# Patient Record
Sex: Female | Born: 1950
Health system: Southern US, Community
[De-identification: ages and names within clinical notes are randomized; demographics above are authoritative.]

## PROBLEM LIST (undated history)

## (undated) DIAGNOSIS — F32A Depression, unspecified: Secondary | ICD-10-CM

## (undated) DIAGNOSIS — J440 Chronic obstructive pulmonary disease with acute lower respiratory infection: Secondary | ICD-10-CM

## (undated) DIAGNOSIS — E119 Type 2 diabetes mellitus without complications: Secondary | ICD-10-CM

## (undated) DIAGNOSIS — R011 Cardiac murmur, unspecified: Secondary | ICD-10-CM

## (undated) DIAGNOSIS — Z9289 Personal history of other medical treatment: Secondary | ICD-10-CM

## (undated) DIAGNOSIS — E1169 Type 2 diabetes mellitus with other specified complication: Secondary | ICD-10-CM

## (undated) DIAGNOSIS — D494 Neoplasm of unspecified behavior of bladder: Secondary | ICD-10-CM

## (undated) DIAGNOSIS — C679 Malignant neoplasm of bladder, unspecified: Secondary | ICD-10-CM

## (undated) DIAGNOSIS — I10 Essential (primary) hypertension: Secondary | ICD-10-CM

## (undated) DIAGNOSIS — E7800 Pure hypercholesterolemia, unspecified: Secondary | ICD-10-CM

## (undated) DIAGNOSIS — Z8719 Personal history of other diseases of the digestive system: Secondary | ICD-10-CM

## (undated) DIAGNOSIS — F329 Major depressive disorder, single episode, unspecified: Secondary | ICD-10-CM

## (undated) DIAGNOSIS — K219 Gastro-esophageal reflux disease without esophagitis: Secondary | ICD-10-CM

## (undated) DIAGNOSIS — E669 Obesity, unspecified: Secondary | ICD-10-CM

## (undated) DIAGNOSIS — J209 Acute bronchitis, unspecified: Secondary | ICD-10-CM

## (undated) DIAGNOSIS — J189 Pneumonia, unspecified organism: Secondary | ICD-10-CM

## (undated) DIAGNOSIS — J449 Chronic obstructive pulmonary disease, unspecified: Secondary | ICD-10-CM

## (undated) DIAGNOSIS — D509 Iron deficiency anemia, unspecified: Secondary | ICD-10-CM

## (undated) HISTORY — DX: Acute bronchitis, unspecified: J20.9

## (undated) HISTORY — DX: Chronic obstructive pulmonary disease with (acute) lower respiratory infection: J44.0

## (undated) HISTORY — DX: Obesity, unspecified: E66.9

## (undated) HISTORY — DX: Type 2 diabetes mellitus with other specified complication: E11.69

## (undated) HISTORY — DX: Cardiac murmur, unspecified: R01.1

---

## 1978-11-15 HISTORY — PX: TUBAL LIGATION: SHX77

## 1978-11-15 HISTORY — PX: APPENDECTOMY: SHX54

## 2010-07-28 ENCOUNTER — Emergency Department (HOSPITAL_COMMUNITY): Payer: 59

## 2010-07-28 ENCOUNTER — Inpatient Hospital Stay (HOSPITAL_COMMUNITY)
Admission: EM | Admit: 2010-07-28 | Discharge: 2010-07-31 | DRG: 193 | Disposition: A | Discharge status: Home / Self Care | Payer: 59 | Attending: Family Medicine | Admitting: Family Medicine

## 2010-07-28 DIAGNOSIS — M79609 Pain in unspecified limb: Secondary | ICD-10-CM

## 2010-07-28 DIAGNOSIS — J9620 Acute and chronic respiratory failure, unspecified whether with hypoxia or hypercapnia: Secondary | ICD-10-CM | POA: Diagnosis present

## 2010-07-28 DIAGNOSIS — E876 Hypokalemia: Secondary | ICD-10-CM | POA: Diagnosis present

## 2010-07-28 DIAGNOSIS — J189 Pneumonia, unspecified organism: Principal | ICD-10-CM | POA: Diagnosis present

## 2010-07-28 DIAGNOSIS — K219 Gastro-esophageal reflux disease without esophagitis: Secondary | ICD-10-CM | POA: Diagnosis present

## 2010-07-28 DIAGNOSIS — R0902 Hypoxemia: Secondary | ICD-10-CM | POA: Diagnosis present

## 2010-07-28 DIAGNOSIS — E871 Hypo-osmolality and hyponatremia: Secondary | ICD-10-CM | POA: Diagnosis present

## 2010-07-28 DIAGNOSIS — F3289 Other specified depressive episodes: Secondary | ICD-10-CM | POA: Diagnosis present

## 2010-07-28 DIAGNOSIS — F329 Major depressive disorder, single episode, unspecified: Secondary | ICD-10-CM | POA: Diagnosis present

## 2010-07-28 DIAGNOSIS — F17200 Nicotine dependence, unspecified, uncomplicated: Secondary | ICD-10-CM | POA: Diagnosis present

## 2010-07-28 DIAGNOSIS — J441 Chronic obstructive pulmonary disease with (acute) exacerbation: Secondary | ICD-10-CM | POA: Diagnosis present

## 2010-07-28 DIAGNOSIS — M7989 Other specified soft tissue disorders: Secondary | ICD-10-CM | POA: Diagnosis present

## 2010-07-28 LAB — DIFFERENTIAL
Basophils Absolute: 0 10*3/uL (ref 0.0–0.1)
Basophils Relative: 0 % (ref 0–1)
Eosinophils Absolute: 0 10*3/uL (ref 0.0–0.7)
Eosinophils Relative: 0 % (ref 0–5)
Lymphocytes Relative: 11 % — ABNORMAL LOW (ref 12–46)
Lymphs Abs: 1.2 10*3/uL (ref 0.7–4.0)
Monocytes Absolute: 1.1 10*3/uL — ABNORMAL HIGH (ref 0.1–1.0)
Monocytes Relative: 10 % (ref 3–12)
Neutro Abs: 8.6 K/uL — ABNORMAL HIGH (ref 1.7–7.7)
Neutrophils Relative %: 79 % — ABNORMAL HIGH (ref 43–77)

## 2010-07-28 LAB — BASIC METABOLIC PANEL WITH GFR
CO2: 27 meq/L (ref 19–32)
Calcium: 8.7 mg/dL (ref 8.4–10.5)
Chloride: 91 meq/L — ABNORMAL LOW (ref 96–112)
Creatinine, Ser: <0.47 mg/dL (ref 0.4–1.2)
Glucose, Bld: 133 mg/dL — ABNORMAL HIGH (ref 70–99)
Sodium: 130 meq/L — ABNORMAL LOW (ref 135–145)

## 2010-07-28 LAB — BASIC METABOLIC PANEL
BUN: 11 mg/dL (ref 6–23)
Potassium: 3.2 mEq/L — ABNORMAL LOW (ref 3.5–5.1)

## 2010-07-28 LAB — CBC
HCT: 35.4 % — ABNORMAL LOW (ref 36.0–46.0)
Hemoglobin: 11.7 g/dL — ABNORMAL LOW (ref 12.0–15.0)
MCH: 27.9 pg (ref 26.0–34.0)
MCHC: 33.1 g/dL (ref 30.0–36.0)
MCV: 84.3 fL (ref 78.0–100.0)
Platelets: 260 10*3/uL (ref 150–400)
RBC: 4.2 MIL/uL (ref 3.87–5.11)
RDW: 12.4 % (ref 11.5–15.5)
WBC: 10.9 10*3/uL — ABNORMAL HIGH (ref 4.0–10.5)

## 2010-07-28 LAB — CK TOTAL AND CKMB (NOT AT ARMC)
Relative Index: 2.4 (ref 0.0–2.5)
Total CK: 435 U/L — ABNORMAL HIGH (ref 7–177)

## 2010-07-28 LAB — D-DIMER, QUANTITATIVE: D-Dimer, Quant: 0.32 ug/mL-FEU (ref 0.00–0.48)

## 2010-07-29 LAB — CARDIAC PANEL(CRET KIN+CKTOT+MB+TROPI)
Relative Index: 2.5 (ref 0.0–2.5)
Total CK: 493 U/L — ABNORMAL HIGH (ref 7–177)
Troponin I: <0.3 ng/mL (ref <0.30)

## 2010-07-29 LAB — CK TOTAL AND CKMB (NOT AT ARMC): Relative Index: 2.6 — ABNORMAL HIGH (ref 0.0–2.5)

## 2010-07-29 LAB — HEMOGLOBIN A1C
Hgb A1c MFr Bld: 6.5 % — ABNORMAL HIGH (ref <5.7)
Mean Plasma Glucose: 140 mg/dL — ABNORMAL HIGH (ref <117)

## 2010-07-29 LAB — COMPREHENSIVE METABOLIC PANEL
AST: 33 U/L (ref 0–37)
Albumin: 3.4 g/dL — ABNORMAL LOW (ref 3.5–5.2)
Calcium: 8.7 mg/dL (ref 8.4–10.5)
Creatinine, Ser: <0.47 mg/dL (ref 0.4–1.2)
Total Protein: 7 g/dL (ref 6.0–8.3)

## 2010-07-29 LAB — CBC
Hemoglobin: 11.3 g/dL — ABNORMAL LOW (ref 12.0–15.0)
MCH: 27.8 pg (ref 26.0–34.0)
RBC: 4.07 MIL/uL (ref 3.87–5.11)

## 2010-07-29 LAB — TROPONIN I: Troponin I: <0.3 ng/mL (ref <0.30)

## 2010-07-30 LAB — CBC
HCT: 33.2 % — ABNORMAL LOW (ref 36.0–46.0)
Hemoglobin: 10.5 g/dL — ABNORMAL LOW (ref 12.0–15.0)
MCH: 27.6 pg (ref 26.0–34.0)
MCHC: 31.6 g/dL (ref 30.0–36.0)
MCV: 87.1 fL (ref 78.0–100.0)
Platelets: 270 10*3/uL (ref 150–400)
RBC: 3.81 MIL/uL — ABNORMAL LOW (ref 3.87–5.11)
RDW: 12.7 % (ref 11.5–15.5)
WBC: 9.2 10*3/uL (ref 4.0–10.5)

## 2010-07-31 LAB — DIFFERENTIAL
Basophils Absolute: 0.1 K/uL (ref 0.0–0.1)
Basophils Relative: 1 % (ref 0–1)
Eosinophils Absolute: 0 K/uL (ref 0.0–0.7)
Eosinophils Relative: 0 % (ref 0–5)
Lymphocytes Relative: 34 % (ref 12–46)
Lymphs Abs: 3.2 K/uL (ref 0.7–4.0)
Monocytes Absolute: 0.8 K/uL (ref 0.1–1.0)
Monocytes Relative: 9 % (ref 3–12)
Neutro Abs: 5.3 K/uL (ref 1.7–7.7)
Neutrophils Relative %: 56 % (ref 43–77)

## 2010-07-31 LAB — COMPREHENSIVE METABOLIC PANEL
ALT: 30 U/L (ref 0–35)
AST: 33 U/L (ref 0–37)
Albumin: 3.1 g/dL — ABNORMAL LOW (ref 3.5–5.2)
Alkaline Phosphatase: 50 U/L (ref 39–117)
Glucose, Bld: 87 mg/dL (ref 70–99)
Potassium: 3.9 mEq/L (ref 3.5–5.1)
Sodium: 139 mEq/L (ref 135–145)
Total Protein: 6.3 g/dL (ref 6.0–8.3)

## 2010-07-31 LAB — CBC
HCT: 32.5 % — ABNORMAL LOW (ref 36.0–46.0)
Hemoglobin: 10.1 g/dL — ABNORMAL LOW (ref 12.0–15.0)
MCH: 27.4 pg (ref 26.0–34.0)
MCHC: 31.1 g/dL (ref 30.0–36.0)
MCV: 88.1 fL (ref 78.0–100.0)
Platelets: 246 K/uL (ref 150–400)
RBC: 3.69 MIL/uL — ABNORMAL LOW (ref 3.87–5.11)
RDW: 12.6 % (ref 11.5–15.5)
WBC: 9.4 K/uL (ref 4.0–10.5)

## 2010-07-31 LAB — MAGNESIUM: Magnesium: 2.2 mg/dL (ref 1.5–2.5)

## 2010-08-01 NOTE — Discharge Summary (Signed)
Savannah Stanley, Savannah Stanley                ACCOUNT NO.:  [id number]  MEDICAL RECORD NO.:  [id number]           PATIENT TYPE:  I  LOCATION:  1521                         FACILITY:  [facility]  PHYSICIAN:  Talmage Nap, MD  DATE OF BIRTH:  1950/07/25  DATE OF ADMISSION:  07/28/2010 DATE OF DISCHARGE:  07/31/2010                        DISCHARGE SUMMARY - REFERRING   DISCHARGING DOCTOR:  Talmage Nap, M.D.  PRIMARY CARE PHYSICIAN:  Unassigned.  Patient is to make an appointment with [facility].  ADMITTING PHYSICIAN:  Hind I Elsaid, M.D.  DISCHARGE DIAGNOSES: 1. Chronic obstructive pulmonary disease exacerbation. 2. Questionable pneumonia. 3. Chronic hypoxemia (Acute on chronic respiratory failure).  Patient     is on home O2 3 liters per minute q.24h.  Pulse ox in room air     without oxygen 87%. 4. Chronic tobacco use - Patient to be on nicotine patch. 5. Gastroesophageal reflux disease. 6. History of depression.  BRIEF HISTORY:  Patient is a 60 year old very pleasant Caucasian female, who was admitted to the hospital on Jul 28, 2010 by Dr. Ebony Cargo because of 4 days history of chest congestion and cough as well as wheezing.  Patient was said to have recently relocated to [facility] following the death of her husband 6 months ago.  Symptoms were however said to be getting progressively worse.  The chest congestion was said to be bilateral and pleuritic.  This was said to be associated with cough that was productive of sputum.  She, however, denies any fever. She denies any chills or rigor.  In the Emergency Room, patient was found to have a temperature of 97.4.  There was, however, no history of nausea or vomiting.  She denies any PND, orthopnea.  Cough was said to be associated with persistent tachycardic and also hypoxia. Patient presented to the hospital to be evaluated.  PREADMISSION MEDICATIONS:  Her preadmission meds include: 1. Celexa (citalopram 20 mg one  p.o. daily). 2. Prilosec (omeprazole 20 mg p.o. daily).  ALLERGIES:  She has no known allergies.  SOCIAL HISTORY:  Positive for chronic tobacco use.  PAST SURGICAL HISTORY: 1. Appendectomy. 2. Tubal ligation.  FAMILY HISTORY:  Father died of complication of prostate CA and husband recently deceased.  Cause of death not documented in the initial history and physical.  REVIEW OF SYSTEMS:  Essentially documented in the initial history and physical.  PHYSICAL EXAMINATION:  VITAL SIGNS:  At time patient was seen by the admitting physician, vital signs, temperature of 97.7, blood pressure is 167/79, pulse 109, respiratory rate 24, saturating 90% O2 via nasal cannula 2 liters per minute. GENERAL:  She was said to be in mild respiratory distress.  She was not using her accessory muscles to breathe. HEENT:  Pupils are reactive to light and extraocular muscles are intact. NECK:  No jugular venous distention.  No carotid bruit.  No lymphadenopathy. CHEST:  Showed inspiratory and inspiratory rhonchi. HEART:  Sounds are one and two. ABDOMEN:  Soft, nontender.  Liver, spleen and kidney not palpable. Bowel sounds are positive. EXTREMITIES:  Show mild edema in the left lower extremity, nonpitting. Peripheral pulses were  intact. NEUROPSYCHIATRIC EVALUATION:  Unremarkable. SKIN:  Normal turgor. MUSCULOSKELETAL SYSTEM:  Unremarkable.  LABORATORY DATA:  Initial basic metabolic panel showed sodium of 130, potassium of 3.2, chloride of 19 with the bicarb of 27, glucose is 133, BUN is 11, creatinine 0.47.  Complete blood count with differential showed WBC of 10.9, hemoglobin of 9.7, hematocrit of 35.4, MCV of 84.3 with the platelet count of 260,000, neutrophils 79% and absolute neutrophil count is 8.6.  D-dimer 0.32.  Cardiac enzymes, troponin-I less than 0.30, CK-MB is 10.3.  Repeat of troponin-I as follows:  Less than 0.30 and CK-MB 13.1, INR 1.2 respectively.  A repeat complete blood count  with no differential done on Jul 29, 2010 showed WBC 7.5, hemoglobin 11.3, hematocrit of 34.8, MCV of 85.5 with the platelet count of 262,000.  Comprehensive metabolic panel showed sodium of 138, potassium of 3.8, chloride of 99 with the bicarb of 28, glucose is 130, BUN is 12, creatinine 0.47.  Magnesium level is 2.4, phosphorus level is 4.3.  Hemoglobin A1c is 6.5.  A repeat complete blood count with differential done on Jul 31, 2010 showed WBC of 9.4, hemoglobin of 10.1, hematocrit 32.5, MCV of 88.1 with the platelet count of 246,000, normal differentials.  Magnesium level is 2.2.  Comprehensive metabolic panel showed sodium of 139, potassium of 3.9, chloride of 99 with the bicarb of 33, glucose is 87, BUN is 17, creatinine 0.47.  LFTs normal. Pathology smear showed normocytic anemia and atypical mononuclear cells.  DIAGNOSTIC DATA:  Imaging studies done include chest x-ray which showed bronchial thickening, patchy infiltrate in both lower lungs consistent with mild pneumonia, no dense consolidation seen.  HOSPITAL COURSE:  Patient was admitted to telemetry with an impression of pneumonia with bronchitis, was started on normal saline with 20 mEq of KCl to go at rate of 75 mL an hour.  She was started on Zithromax 500 mg IV q.24h., Rocephin 1 gram IV q.24h. and she was also started on Solu- Medrol 60 mg IV q.12h.  Heparin 5000 units subcutaneously daily for DVT prophylaxis.  She was also given Zofran for nausea.  In addition, patient was started on breathing treatment, albuterol and Atrovent nebs q.6h.  She was also given Mucinex as well as Robitussin.  Pain control was done with Tylenol p.r.n. for temperature greater than 100.6. Patient was also given Ambien 5 mg p.o. q.h.s. p.r.n. for insomnia and GI prophylaxis was done with Protonix 40 mg p.o. daily.  On Jul 29, 2010, IV Solu-Medrol was discontinued and patient was started on prednisone 60 mg p.o. daily and she also had IV fluids  KVO.  Also, given to patient was Combivent MDI 2 puffs t.i.d. p.r.n.  Patient was seen by me for the very first time in this admission.  On Jul 30, 2010 and during this encounter, patient denied any complaint and pulse ox in room air was 84% without oxygen.  Examination of the patient showed minimal rhonchi.  At this point, patient was continued on breathing treatment and also IV Rocephin and Zithromax for questionable community-acquired pneumonia.  She was also re-evaluated by me on Jul 31, 2010 and during this encounter, patient felt better.  She denied any chest pain or shortness of breath, cough was much reduced.  She was ambulated without O2 and  pulse ox in room air was 87%, so far, patient has remained clinically stable.  I had an extensive discussion on my two encounters with the patient, patient's son and daughter as well  as her son-in-law and all verbalized understanding.  Plan therefore is for patient to be discharged home today on activity as tolerated, smoking cessation and she was advised,  to make an appointment with [facility] in order to get a permanent primary care physician.  DISCHARGE MEDICATIONS:  Medications to be taken at home will include: 1. Advair Diskus 250/50 one puff b.i.d. 2. Albuterol nebulizer q.4h. p.r.n. 3. Atrovent nebs 0.5 mg q.4h. p.r.n. 4. Augmentin 500 mg one p.o. b.i.d. for the next 7 days. 5. Guaifenesin 600 mg one p.o. b.i.d. 6. Home O2 3 liters per minute q.24h.  Pulse ox in room air is 87%. 7. Combivent inhaler 2 puffs q.4h. p.r.n. 8. Nebulizer machine. 9. Nicotine patch 14 mg transdermal q.24h. 10.Prednisone starting with 20 mg p.o. daily x1 and then 10 mg p.o.     daily x1 and subsequently discontinued. 11.Celexa (citalopram) 20 mg one p.o. daily. 12.Prilosec (omeprazole) 20 mg one p.o. daily. 13.Ambien 5mg  p.o qhs prn.     Talmage Nap, MD     CN/MEDQ  D:  07/31/2010  T:  07/31/2010  Job:  [id number]  cc:   Cor[facility]  Primary Care  Electronically Signed by Talmage Nap  on 08/01/2010 07:01:17 AM

## 2010-08-07 ENCOUNTER — Encounter: Payer: Self-pay | Admitting: Internal Medicine

## 2010-08-07 ENCOUNTER — Ambulatory Visit (INDEPENDENT_AMBULATORY_CARE_PROVIDER_SITE_OTHER): Payer: 59 | Admitting: Internal Medicine

## 2010-08-07 VITALS — BP 166/102 | Ht 63.0 in | Wt 172.0 lb

## 2010-08-07 DIAGNOSIS — D649 Anemia, unspecified: Secondary | ICD-10-CM

## 2010-08-07 DIAGNOSIS — F3289 Other specified depressive episodes: Secondary | ICD-10-CM

## 2010-08-07 DIAGNOSIS — F32A Depression, unspecified: Secondary | ICD-10-CM

## 2010-08-07 DIAGNOSIS — J4489 Other specified chronic obstructive pulmonary disease: Secondary | ICD-10-CM

## 2010-08-07 DIAGNOSIS — R739 Hyperglycemia, unspecified: Secondary | ICD-10-CM

## 2010-08-07 DIAGNOSIS — F329 Major depressive disorder, single episode, unspecified: Secondary | ICD-10-CM

## 2010-08-07 DIAGNOSIS — J449 Chronic obstructive pulmonary disease, unspecified: Secondary | ICD-10-CM

## 2010-08-07 DIAGNOSIS — G4700 Insomnia, unspecified: Secondary | ICD-10-CM

## 2010-08-07 DIAGNOSIS — IMO0001 Reserved for inherently not codable concepts without codable children: Secondary | ICD-10-CM

## 2010-08-07 DIAGNOSIS — R7309 Other abnormal glucose: Secondary | ICD-10-CM

## 2010-08-07 DIAGNOSIS — R030 Elevated blood-pressure reading, without diagnosis of hypertension: Secondary | ICD-10-CM

## 2010-08-07 LAB — CBC WITH DIFFERENTIAL/PLATELET
Basophils Relative: 1.8 % (ref 0.0–3.0)
Eosinophils Absolute: 0 10*3/uL (ref 0.0–0.7)
HCT: 33.3 % — ABNORMAL LOW (ref 36.0–46.0)
Lymphocytes Relative: 11.7 % — ABNORMAL LOW (ref 12.0–46.0)
MCHC: 33.2 g/dL (ref 30.0–36.0)
MCV: 86.8 fl (ref 78.0–100.0)
Monocytes Relative: 5.9 % (ref 3.0–12.0)
Neutro Abs: 8.8 10*3/uL — ABNORMAL HIGH (ref 1.4–7.7)
Neutrophils Relative %: 80.6 % — ABNORMAL HIGH (ref 43.0–77.0)

## 2010-08-07 MED ORDER — CITALOPRAM HYDROBROMIDE 20 MG PO TABS: 20.0000 mg | ORAL_TABLET | Freq: Every day | ORAL | Status: DC

## 2010-08-07 MED ORDER — IPRATROPIUM-ALBUTEROL 18-103 MCG/ACT IN AERO: 2.0000 | INHALATION_SPRAY | Freq: Four times a day (QID) | RESPIRATORY_TRACT | Status: DC | PRN

## 2010-08-07 MED ORDER — ZOLPIDEM TARTRATE 10 MG PO TABS: ORAL_TABLET | ORAL | Status: DC

## 2010-08-07 MED ORDER — OMEPRAZOLE 20 MG PO CPDR: 40.0000 mg | DELAYED_RELEASE_CAPSULE | Freq: Every day | ORAL | Status: DC

## 2010-08-07 MED ORDER — FLUTICASONE-SALMETEROL 250-50 MCG/DOSE IN AEPB: 1.0000 | INHALATION_SPRAY | Freq: Two times a day (BID) | RESPIRATORY_TRACT | Status: DC

## 2010-08-08 LAB — FOLATE: Folate: 18.7 ng/mL (ref >5.9)

## 2010-08-08 LAB — VITAMIN B12: Vitamin B-12: 858 pg/mL (ref 211–911)

## 2010-08-12 ENCOUNTER — Telehealth: Payer: Self-pay

## 2010-08-12 NOTE — Telephone Encounter (Signed)
Pt notified of lab results but declines the colonoscopy recommended by MD.   Pt wants to know if she can turn the O2 equipment back in  Pt also notes that pharm needs clarification on prilosec dosage. She has been taking one 20 mg tab qd but rx has her taking 20 mg bid. I will call pharm to clarify

## 2010-08-12 NOTE — Telephone Encounter (Signed)
Message copied by Beverely Low on Tue Aug 12, 2010  4:06 PM ------      Message from: Staci Righter      Created: Mon Aug 11, 2010  1:13 PM       Remains anemic but mildly improved. Does appear to be iron deficient. Recommend colonoscopy at least. If willing will need gi consult

## 2010-08-13 ENCOUNTER — Telehealth: Payer: Self-pay | Admitting: Internal Medicine

## 2010-08-13 NOTE — Telephone Encounter (Signed)
Pt is req to get her Oxygen tank pick up from [facility]. Pt needs to get order sent to [facility] fax # [phone].

## 2010-08-13 NOTE — Telephone Encounter (Signed)
Per Dr. Rodena Medin, an order cannot be written by him for [facility] to pick up the oxygen tank unless documentation is received stated that pt was weaned off the oxygen.  Spoke with pt who states that she wasn't weaned off it. She stopped using because she didn't need it anymore. Pt notes that [facility] visited her to assess her progress and gave her a smaller tank to use outside the house but she hasn't needed it.

## 2010-08-17 ENCOUNTER — Encounter: Payer: Self-pay | Admitting: Internal Medicine

## 2010-08-17 DIAGNOSIS — D649 Anemia, unspecified: Secondary | ICD-10-CM | POA: Insufficient documentation

## 2010-08-17 DIAGNOSIS — E1169 Type 2 diabetes mellitus with other specified complication: Secondary | ICD-10-CM | POA: Insufficient documentation

## 2010-08-17 DIAGNOSIS — F32A Depression, unspecified: Secondary | ICD-10-CM | POA: Insufficient documentation

## 2010-08-17 DIAGNOSIS — F329 Major depressive disorder, single episode, unspecified: Secondary | ICD-10-CM | POA: Insufficient documentation

## 2010-08-17 DIAGNOSIS — J449 Chronic obstructive pulmonary disease, unspecified: Secondary | ICD-10-CM | POA: Insufficient documentation

## 2010-08-17 DIAGNOSIS — G4700 Insomnia, unspecified: Secondary | ICD-10-CM | POA: Insufficient documentation

## 2010-08-17 NOTE — Assessment & Plan Note (Signed)
Asymptomatic. Obtain CBC, iron, B12

## 2010-08-17 NOTE — Assessment & Plan Note (Signed)
suboptimal control. Increase Ambien 10 mg q.h.s. P.r.n.

## 2010-08-17 NOTE — Assessment & Plan Note (Signed)
Asymptomatic. Restrict sugar and carbohydrate intake.

## 2010-08-17 NOTE — Assessment & Plan Note (Signed)
Isolated elevation. Normotensive control typically. Recommend out patient blood pressure log

## 2010-08-17 NOTE — Assessment & Plan Note (Signed)
Recent exacerbation with pneumonia. Returning to baseline. Off oxygen. Refilled Advair. Instructed to rinse mouth after use

## 2010-08-17 NOTE — Progress Notes (Signed)
  Subjective:    Patient ID: Savannah Stanley, female    DOB: 10-17-1950, 60 y.o.   MRN: [id number]  HPI patient presents to clinic to establish primary care and for followup of pneumonia. Recently hospitalized with possible bibasilar pneumonia with associated COPD. Was hypoxic and maintained on home O2 however states respiratory care has weaned her off of her oxygen and she is a symptomatically without shortness of breath. Has long standing tobacco history and is attempting cessation currently. Blood pressure elevated today which is unusual in home blood pressures have been normotensive. Has history of insomnia recently taking Ambien 5 mg q.h.s. With suboptimal results. Hospital labs reviewed including hemoglobin of approximately 10 and A1c of 6.5. Denies abdominal pain hematemesis hematochezia or melena. Has no formal history of diabetes. No other complaints  Reviewed past medical history, medications, allergies, past surgical history, family history, social history    Review of Systems  Constitutional: Negative for fever and chills.  HENT: Negative for congestion, facial swelling and rhinorrhea.   Eyes: Negative for discharge and redness.  Respiratory: Negative for cough, shortness of breath and wheezing.   Cardiovascular: Negative for chest pain.  Gastrointestinal: Negative for abdominal pain and blood in stool.  Genitourinary: Negative for dysuria, decreased urine volume and difficulty urinating.  Musculoskeletal: Negative for back pain and arthralgias.  Skin: Negative for color change, pallor and rash.  Neurological: Negative for dizziness, seizures and syncope.  Hematological: Negative for adenopathy. Does not bruise/bleed easily.  Psychiatric/Behavioral: Positive for sleep disturbance. Negative for agitation. The patient is not nervous/anxious.        Objective:   Physical Exam    Physical Exam  Vitals reviewed. Constitutional:  appears well-developed and well-nourished. No  distress.  HENT:  Head: Normocephalic and atraumatic.  Right Ear: Tympanic membrane, external ear and ear canal normal.  Left Ear: Tympanic membrane, external ear and ear canal normal.  Nose: Nose normal.  Mouth/Throat: Oropharynx is clear and moist. No oropharyngeal exudate.  Eyes: Conjunctivae and EOM are normal. Pupils are equal, round, and reactive to light. Right eye exhibits no discharge. Left eye exhibits no discharge. No scleral icterus.  Neck: Neck supple. No thyromegaly present.  Cardiovascular: Normal rate, regular rhythm and normal heart sounds.  Exam reveals no gallop and no friction rub.   No murmur heard. Pulmonary/Chest: Effort normal and breath sounds normal. No respiratory distress.  has no wheezes.  has no rales.  Lymphadenopathy:   no cervical adenopathy.  Neurological:  is alert.  Skin: Skin is warm and dry.  not diaphoretic.  Psychiatric: normal mood and affect.      Assessment & Plan:

## 2010-08-18 ENCOUNTER — Other Ambulatory Visit: Payer: Self-pay

## 2010-08-18 MED ORDER — OMEPRAZOLE 20 MG PO CPDR: 20.0000 mg | DELAYED_RELEASE_CAPSULE | Freq: Every day | ORAL | Status: DC

## 2010-09-22 ENCOUNTER — Ambulatory Visit (INDEPENDENT_AMBULATORY_CARE_PROVIDER_SITE_OTHER): Payer: 59 | Admitting: Internal Medicine

## 2010-09-22 ENCOUNTER — Encounter: Payer: Self-pay | Admitting: Internal Medicine

## 2010-09-22 DIAGNOSIS — I10 Essential (primary) hypertension: Secondary | ICD-10-CM

## 2010-09-22 DIAGNOSIS — D649 Anemia, unspecified: Secondary | ICD-10-CM

## 2010-09-22 DIAGNOSIS — R7309 Other abnormal glucose: Secondary | ICD-10-CM

## 2010-09-22 DIAGNOSIS — R739 Hyperglycemia, unspecified: Secondary | ICD-10-CM

## 2010-09-22 MED ORDER — OMEPRAZOLE 20 MG PO CPDR: 20.0000 mg | DELAYED_RELEASE_CAPSULE | Freq: Two times a day (BID) | ORAL | Status: DC

## 2010-09-22 MED ORDER — FLUTICASONE-SALMETEROL 250-50 MCG/DOSE IN AEPB: 1.0000 | INHALATION_SPRAY | Freq: Two times a day (BID) | RESPIRATORY_TRACT | Status: DC

## 2010-09-22 MED ORDER — LOSARTAN POTASSIUM 25 MG PO TABS: 25.0000 mg | ORAL_TABLET | Freq: Every day | ORAL | Status: DC

## 2010-09-22 NOTE — Progress Notes (Signed)
  Subjective:    Patient ID: Savannah Stanley, female    DOB: [birth year], 60 y.o.   MRN: [id number]  HPI patient presents to clinic for followup of multiple medical problems. Blood pressure remains elevated in clinic without symptoms of headache dizziness or neurologic deficit. Home monitoring reveals blood pressures in the 140s over 90s. COPD is stable without flare or exacerbation. Her shortness of breath or wheezing. Review history of anemia with hemoglobin improved from 10-11.1. Ferritin noted depressed. Denies abdominal pain, hematemesis hematochezia or melena. Continues to decline GI consult for colonoscopy. Reviewed hyperglycemia with A1c of 6.5 with no formal history of diabetes. No active complaint  Reviewed past medical history, medications and allergies.   Review of Systems see history of present illness     Objective:   Physical Exam    Physical Exam  [nursing notereviewed. Constitutional:  appears well-developed and well-nourished. No distress.  HENT:  Head: Normocephalic and atraumatic.  Nose: Nose normal.  Mouth/Throat: Oropharynx is clear and moist. No oropharyngeal exudate.  Eyes: Conjunctivae are normal. No scleral icterus.  Neck: Neck supple.  Cardiovascular: Normal rate, regular rhythm and normal heart sounds.  Exam reveals no gallop and no friction rub.   No murmur heard. Pulmonary/Chest: Effort normal and breath sounds normal. No respiratory distress.  no wheezes.  no rales.  Abdomen: Soft, nondistended, nontender to palpation positive bowel sounds no masses organomegaly. Lymphadenopathy:     no cervical adenopathy.  Neurological:  alert.  Skin: Skin is warm and dry.  not diaphoretic.      Assessment & Plan:

## 2010-09-22 NOTE — Assessment & Plan Note (Signed)
Low sugar carbohydrate diet and exercise recommended

## 2010-09-22 NOTE — Assessment & Plan Note (Signed)
Discussed further evaluation of iron deficiency anemia and patient declines GI consult. Begin iron therapy ferrous sulfate 325 mg twice a day

## 2010-09-22 NOTE — Assessment & Plan Note (Signed)
Component of white coat hypertension. Home blood pressure numbers mildly suboptimal. Begin Cozaar 25 mg daily. Monitor blood pressure as an outpatient and followup in clinic as scheduled

## 2010-09-22 NOTE — Patient Instructions (Signed)
Please take ferrous sulfate 325mg  one or two a day.

## 2010-10-13 NOTE — H&P (Signed)
Savannah Stanley, Savannah Stanley                ACCOUNT NO.:  [id number]  MEDICAL RECORD NO.:  [id number]           PATIENT TYPE:  E  LOCATION:  WLED                         FACILITY:  [facility]  PHYSICIAN:  Terrin Meddaugh I Robbin Escher, MD      DATE OF BIRTH:  January 16, 1951  DATE OF ADMISSION:  07/28/2010 DATE OF DISCHARGE:                             HISTORY & PHYSICAL   PRIMARY CARE PHYSICIAN:  Unassigned.  The patient currently has no MD.  CHIEF COMPLAINT:  Chest congestion, coughing, and wheezing 4 days ago.  HISTORY OF PRESENT ILLNESS:  This is a 60 year old female, she to moved to [facility] to stay with her children after her husband died 6 months ago.  She still has no primary care.  She presented to Urgent Care with chief complaint of headache for the last 4 days, associated with chest congestion, and nasal congestion, and it is out of the area which resolved.  Her symptoms then progressively got worse with shortness of breath and productive cough and generalized body pain.  Also, complained of bilateral chest pain, mainly pleuritic chest pain.  The patient denies any rigors.  Denies any fever.  In the emergency room, the patient found to have a temperature of 97.4 and active wheezing.  Also, found to have a pulse of 120.  She was treated with nebs treatment and IV antibiotic with some improvement, but not completely resolution of her symptoms.  Secondary to the patient's persistent wheezing, tachycardia, and hypoxia, we were called to admit.  PAST MEDICAL HISTORY: 1. Gastroesophageal reflux disease. 2. Depression.  As I mentioned, the patient did not see an MD for more     than 10 years.  PAST SURGICAL HISTORY:  Appendectomy and tubal ligation.  ALLERGIES:  No known drug allergies.  FAMILY HISTORY:  Father died with complication of prostate cancer. Mother died with complication of polio in 48.  She is a widow, has 2 grown children, who lives here in [place].  SYSTEMIC REVIEW:  HEENT:  The  patient right now complaints of headache which is generalized, 5/10.  Denies any blurring of vision.  NEUROLOGIC: Denies any seizures.  Denies any numbness or weakness of her extremities.  RESPIRATORY:  The patient complains still with shortness of breath, but improved.  Cough, dry to productive, mainly clear to yellow sputum.  Generalized body ache.  Denies any sore throat.  Denies any hemoptysis or hematemesis.  GASTROENTEROLOGY:  The patient denies any nausea, vomiting, or abdominal pain.  Denies any diarrhea.  Last bowel movement was yesterday, regular.  UROGENITAL:  Denies any dysuria or frequent urination.  Denies any hematuria.  PHYSICAL EXAMINATION:  VITAL SIGNS:  Temperature 97.7, blood pressure 167/79, pulse rate 109, respiratory rate 24, saturating 93% on 2 L. GENERAL:  The patient is in mild respiratory distress.  She is not using her accessory muscle breathing, but noticed more when she speaks.  She is awake, alert, oriented x3 and no focal neurological finding. HEENT:  Pupils equal, reactive to light and accommodation.  Extraocular muscle movement normal. NECK:  No carotid bruit.  No masses.  No JVD. HEART:  S1 and S2 with no added sounds.  No gallop.  No murmur. LUNGS:  Bilateral wheezing audible. ABDOMEN:  Soft, nontender.  Bowel sounds positive. EXTREMITIES:  There is mild edema of the left lower extremity, but nonpitting.  Peripheral pulses intact.  IMAGING STUDIES:  Chest x-ray, bronchial thickening, patchy infiltrate involving the lower lung, consistent with mild pneumonia, no dense consolidation.  LABORATORY DATA:  CBC, white blood cells 10.9, hemoglobin 11.7, hematocrit 34.4, platelets 260.  Sodium 130, potassium 3.2, chloride 91, CO2 of 27, glucose 133, BUN 11, creatinine 0.47.  ASSESSMENT/PLAN: 1. Acute bronchitis, likely bacterial bronchitis.  The patient will be     admitted to the hospital, treated with Zithromax and Rocephin and     nebs treatment. 2.  Community-acquired pneumonia. 3. Hyponatremia, likely related to the pneumonia. 4. Hypokalemia.  We will replace IV fluids and replace electrolytes. 5. Mild hypertension.  We will start the patient on small dose of     Norvasc as the patient has never seen an MD. 6. Ongoing smoking.  We will provide counselling and nicotine if     patient need it. 7. Left lower extremity swelling, more than the right, but it is     nonpitting edema and Hoffman sign was negative.  We will get D-     dimer.  We will get lower extremity venous Doppler. 8. Provide the patient with deep venous thrombosis and     gastrointestinal prophylaxis.  Further recommendation as hospital course progress.     Savilla Turbyfill Bosie Helper, MD     HIE/MEDQ  D:  07/28/2010  T:  07/28/2010  Job:  [id number]  Electronically Signed by Ebony Cargo MD on 10/13/2010 02:30:17 PM

## 2010-11-24 ENCOUNTER — Ambulatory Visit (INDEPENDENT_AMBULATORY_CARE_PROVIDER_SITE_OTHER): Payer: 59 | Admitting: Internal Medicine

## 2010-11-24 ENCOUNTER — Encounter: Payer: Self-pay | Admitting: Internal Medicine

## 2010-11-24 VITALS — BP 134/80 | HR 97 | Temp 98.2°F | Resp 18 | Ht 63.0 in | Wt 186.0 lb

## 2010-11-24 DIAGNOSIS — J449 Chronic obstructive pulmonary disease, unspecified: Secondary | ICD-10-CM

## 2010-11-24 DIAGNOSIS — R7309 Other abnormal glucose: Secondary | ICD-10-CM

## 2010-11-24 DIAGNOSIS — D649 Anemia, unspecified: Secondary | ICD-10-CM

## 2010-11-24 DIAGNOSIS — R739 Hyperglycemia, unspecified: Secondary | ICD-10-CM

## 2010-11-24 DIAGNOSIS — G4700 Insomnia, unspecified: Secondary | ICD-10-CM

## 2010-11-24 DIAGNOSIS — Z23 Encounter for immunization: Secondary | ICD-10-CM

## 2010-11-24 DIAGNOSIS — I10 Essential (primary) hypertension: Secondary | ICD-10-CM

## 2010-11-24 NOTE — Progress Notes (Signed)
  Subjective:    Patient ID: Savannah Stanley, female    DOB: July 04, 1950, 60 y.o.   MRN: [id number]  HPI Pt presents to clinic for followup of multiple medical problems. BP improved and under average control. No recent copd flare. Denies dyspnea/wheezing. Rinsing mouth after advair. Taking iron sulfate qd and not interested in colonoscopy. Due for dtap and flu vaccine. Is sleeping better. Quit smoking but still using electronic cigarettes. No complaints.  No past medical history on file. Past Surgical History  Procedure Date  . Appendectomy     reports that he has quit smoking. He does not have any smokeless tobacco history on file. He reports that he does not drink alcohol or use illicit drugs. family history includes Cancer in his father. No Known Allergies   Review of Systems see hpi     Objective:   Physical Exam  Physical Exam  Nursing note and vitals reviewed. Constitutional: Appears well-developed and well-nourished. No distress.  HENT:  Head: Normocephalic and atraumatic.  Right Ear: External ear normal.  Left Ear: External ear normal.  Eyes: Conjunctivae are normal. No scleral icterus.  Neck: Neck supple. Carotid bruit is not present.  Cardiovascular: Normal rate, regular rhythm and normal heart sounds.  Exam reveals no gallop and no friction rub.   No murmur heard. Pulmonary/Chest: Effort normal and breath sounds normal. No respiratory distress. He has no wheezes. no rales.  Lymphadenopathy:    He has no cervical adenopathy.  Neurological:Alert.  Skin: Skin is warm and dry. Not diaphoretic.  Psychiatric: Has a normal mood and affect.        Assessment & Plan:

## 2010-11-24 NOTE — Assessment & Plan Note (Signed)
Continue iron tx. Not interested in GI evaluation. Obtain cbc and ferritin prior to next visit

## 2010-11-24 NOTE — Assessment & Plan Note (Signed)
Stable. No recent exacerbation. Continue current regimen. Wean off electronic cigarettes.

## 2010-11-24 NOTE — Assessment & Plan Note (Signed)
Obtain chem7 prior to next visit

## 2010-11-24 NOTE — Assessment & Plan Note (Signed)
Improved

## 2010-11-24 NOTE — Patient Instructions (Signed)
Please schedule cbc, ferritin (iron deficiency anemia), chem7 (v58.69) and lipid (chol screening) prior to next visit

## 2010-11-24 NOTE — Assessment & Plan Note (Signed)
Normotensive and stable. Continue current regimen. Monitor bp as outpt and followup in clinic as scheduled.  

## 2010-11-26 ENCOUNTER — Other Ambulatory Visit: Payer: Self-pay | Admitting: Internal Medicine

## 2010-11-26 DIAGNOSIS — Z13220 Encounter for screening for lipoid disorders: Secondary | ICD-10-CM

## 2010-11-26 DIAGNOSIS — Z79899 Other long term (current) drug therapy: Secondary | ICD-10-CM

## 2010-11-26 DIAGNOSIS — D509 Iron deficiency anemia, unspecified: Secondary | ICD-10-CM

## 2011-02-12 ENCOUNTER — Other Ambulatory Visit: Payer: Self-pay | Admitting: *Deleted

## 2011-02-12 DIAGNOSIS — D509 Iron deficiency anemia, unspecified: Secondary | ICD-10-CM

## 2011-02-12 DIAGNOSIS — Z13220 Encounter for screening for lipoid disorders: Secondary | ICD-10-CM

## 2011-02-12 DIAGNOSIS — Z79899 Other long term (current) drug therapy: Secondary | ICD-10-CM

## 2011-02-12 LAB — BASIC METABOLIC PANEL
Glucose, Bld: 116 mg/dL — ABNORMAL HIGH (ref 70–99)
Potassium: 4.2 mEq/L (ref 3.5–5.3)
Sodium: 139 mEq/L (ref 135–145)

## 2011-02-12 LAB — CBC
HCT: 39 % (ref 36.0–46.0)
Platelets: 209 10*3/uL (ref 150–400)
RBC: 4.33 MIL/uL (ref 3.87–5.11)
RDW: 13 % (ref 11.5–15.5)
WBC: 6.1 10*3/uL (ref 4.0–10.5)

## 2011-02-12 LAB — LIPID PANEL
Cholesterol: 194 mg/dL (ref 0–200)
Total CHOL/HDL Ratio: 3 Ratio
Triglycerides: 95 mg/dL (ref <150)
VLDL: 19 mg/dL (ref 0–40)

## 2011-02-19 ENCOUNTER — Encounter: Payer: Self-pay | Admitting: Internal Medicine

## 2011-02-19 ENCOUNTER — Ambulatory Visit (INDEPENDENT_AMBULATORY_CARE_PROVIDER_SITE_OTHER): Payer: 59 | Admitting: Internal Medicine

## 2011-02-19 DIAGNOSIS — F32A Depression, unspecified: Secondary | ICD-10-CM

## 2011-02-19 DIAGNOSIS — F3289 Other specified depressive episodes: Secondary | ICD-10-CM

## 2011-02-19 DIAGNOSIS — J449 Chronic obstructive pulmonary disease, unspecified: Secondary | ICD-10-CM

## 2011-02-19 DIAGNOSIS — F329 Major depressive disorder, single episode, unspecified: Secondary | ICD-10-CM

## 2011-02-19 DIAGNOSIS — R739 Hyperglycemia, unspecified: Secondary | ICD-10-CM

## 2011-02-19 DIAGNOSIS — R7309 Other abnormal glucose: Secondary | ICD-10-CM

## 2011-02-19 MED ORDER — CITALOPRAM HYDROBROMIDE 40 MG PO TABS: 40.0000 mg | ORAL_TABLET | Freq: Every day | ORAL | Status: DC

## 2011-02-19 NOTE — Progress Notes (Signed)
  Subjective:    Patient ID: Savannah Stanley, female    DOB: [birth year], 60 y.o.   MRN: [id number]  HPI Pt presents to clinic for followup of multiple medical problems. No copd flare-well controlled with advair. Continues to rinse mouth. bp minimally high but home monitoring has been normotensive. Reviewed continued mild hyperglycemia not diagnostic of dm. Eating more sweats and has gained wt. Depression may not be under adequate control. Family apparently has noticed decreased mood. Still bothered by her husband's death appropriately. No side effects with celexa.  No past medical history on file. Past Surgical History  Procedure Date  . Appendectomy     reports that she has quit smoking. She has never used smokeless tobacco. She reports that she does not drink alcohol or use illicit drugs. family history includes Cancer in her father. No Known Allergies   Review of Systems see hpi     Objective:   Physical Exam  Physical Exam  Nursing note and vitals reviewed. Constitutional: Appears well-developed and well-nourished. No distress.  HENT:  Head: Normocephalic and atraumatic.  Right Ear: External ear normal.  Left Ear: External ear normal.  Eyes: Conjunctivae are normal. No scleral icterus.  Neck: Neck supple. Carotid bruit is not present.  Cardiovascular: Normal rate, regular rhythm and normal heart sounds.  Exam reveals no gallop and no friction rub.   No murmur heard. Pulmonary/Chest: Effort normal and breath sounds normal. No respiratory distress. He has no wheezes. no rales.  Lymphadenopathy:    He has no cervical adenopathy.  Neurological:Alert.  Skin: Skin is warm and dry. Not diaphoretic.  Psychiatric: Has a normal mood and affect.        Assessment & Plan:

## 2011-02-19 NOTE — Assessment & Plan Note (Signed)
Low sugar/carb diet and exercise

## 2011-02-19 NOTE — Assessment & Plan Note (Signed)
Stable and asx

## 2011-02-19 NOTE — Assessment & Plan Note (Signed)
Mildly suboptimal control. Increase celexa to 40mg  qd and schedule closer follow up

## 2011-03-13 ENCOUNTER — Telehealth: Payer: Self-pay | Admitting: *Deleted

## 2011-03-13 ENCOUNTER — Other Ambulatory Visit: Payer: Self-pay | Admitting: Internal Medicine

## 2011-03-13 NOTE — Telephone Encounter (Signed)
Notified pt. Pt states she still has refills of albuterol but has not had to use it since May and declines rx at this time. Pt will call if symptoms worsen.

## 2011-03-13 NOTE — Telephone Encounter (Signed)
Received message from pt stating she has a sore throat, cold and head congestion since yesterday. Wants to know what she can take to prevent it from going to her chest like it did in May. If any medications are prescribed please send them to [facility].  Please advise.

## 2011-03-13 NOTE — Telephone Encounter (Signed)
Unfortunately nothing to keep it from turning into bronchitis. Most viral infxns last 7-10 days but improve before then. If sx's are not improving before then or if it affects her breathing then needs to be seen. Continue taking advair. May want to see if needs rf of albuterol mdi 2p q4-6 hours prn #1 rf4 (don't see it active in chart)

## 2011-03-25 ENCOUNTER — Telehealth: Payer: Self-pay | Admitting: Internal Medicine

## 2011-03-25 MED ORDER — OMEPRAZOLE 20 MG PO CPDR: 20.0000 mg | DELAYED_RELEASE_CAPSULE | Freq: Two times a day (BID) | ORAL | Status: DC

## 2011-03-25 NOTE — Telephone Encounter (Signed)
Patient states that she is almost out of prilosec. Please send refill to [facility]

## 2011-03-25 NOTE — Telephone Encounter (Signed)
Rx refill sent to [facility] per patient request

## 2011-04-13 ENCOUNTER — Other Ambulatory Visit: Payer: Self-pay | Admitting: *Deleted

## 2011-04-13 MED ORDER — FLUTICASONE-SALMETEROL 250-50 MCG/DOSE IN AEPB: 1.0000 | INHALATION_SPRAY | Freq: Two times a day (BID) | RESPIRATORY_TRACT | Status: DC

## 2011-04-13 MED ORDER — OMEPRAZOLE 20 MG PO CPDR: 20.0000 mg | DELAYED_RELEASE_CAPSULE | Freq: Two times a day (BID) | ORAL | Status: DC

## 2011-04-13 NOTE — Telephone Encounter (Signed)
Patient called and left voice message stating Rx refill was not received by [facility] for Advair and  Prilosec.  Call was returned to patient she was informed Rx sent to pharmacy as requested with a 30 day supply sent to [facility] for Pril[facility]c

## 2011-04-20 ENCOUNTER — Encounter: Payer: Self-pay | Admitting: Internal Medicine

## 2011-04-22 ENCOUNTER — Ambulatory Visit: Payer: 59 | Admitting: Internal Medicine

## 2011-04-29 ENCOUNTER — Encounter: Payer: Self-pay | Admitting: Internal Medicine

## 2011-04-29 ENCOUNTER — Ambulatory Visit (INDEPENDENT_AMBULATORY_CARE_PROVIDER_SITE_OTHER): Payer: 59 | Admitting: Internal Medicine

## 2011-04-29 DIAGNOSIS — F32A Depression, unspecified: Secondary | ICD-10-CM

## 2011-04-29 DIAGNOSIS — F3289 Other specified depressive episodes: Secondary | ICD-10-CM

## 2011-04-29 DIAGNOSIS — F329 Major depressive disorder, single episode, unspecified: Secondary | ICD-10-CM

## 2011-04-29 DIAGNOSIS — I10 Essential (primary) hypertension: Secondary | ICD-10-CM

## 2011-04-29 MED ORDER — LOSARTAN POTASSIUM 50 MG PO TABS: 50.0000 mg | ORAL_TABLET | Freq: Every day | ORAL | Status: DC

## 2011-05-03 NOTE — Progress Notes (Signed)
  Subjective:    Patient ID: Savannah Stanley, female    DOB: 01/29/1951, 61 y.o.   MRN: [id number]  HPI Pt presents to clinic for followup of multiple medical problems. S/p celexa increase. Mood stable without side effect. Home bp's mid to upper 130's. No active complaint.   No past medical history on file. Past Surgical History  Procedure Date  . Appendectomy     reports that she has quit smoking. She has never used smokeless tobacco. She reports that she does not drink alcohol or use illicit drugs. family history includes Cancer in her father. No Known Allergies   Review of Systems see hpi     Objective:   Physical Exam  Physical Exam  Nursing note and vitals reviewed. Constitutional: Appears well-developed and well-nourished. No distress.  HENT:  Head: Normocephalic and atraumatic.  Right Ear: External ear normal.  Left Ear: External ear normal.  Eyes: Conjunctivae are normal. No scleral icterus.  Neck: Neck supple. Carotid bruit is not present.  Cardiovascular: Normal rate, regular rhythm and normal heart sounds.  Exam reveals no gallop and no friction rub.   No murmur heard. Pulmonary/Chest: Effort normal and breath sounds normal. No respiratory distress. He has no wheezes. no rales.  Lymphadenopathy:    He has no cervical adenopathy.  Neurological:Alert.  Skin: Skin is warm and dry. Not diaphoretic.  Psychiatric: Has a normal mood and affect.        Assessment & Plan:

## 2011-05-03 NOTE — Assessment & Plan Note (Signed)
Stable. Continue current ssri dosing

## 2011-05-03 NOTE — Assessment & Plan Note (Signed)
Increase losartan 50mg  po qd. Monitor bp as outpt and f/u in clinic as scheduled.

## 2011-07-29 ENCOUNTER — Ambulatory Visit (INDEPENDENT_AMBULATORY_CARE_PROVIDER_SITE_OTHER): Payer: 59 | Admitting: Internal Medicine

## 2011-07-29 ENCOUNTER — Encounter: Payer: Self-pay | Admitting: Internal Medicine

## 2011-07-29 VITALS — BP 134/84 | HR 88 | Temp 98.0°F | Resp 18 | Ht 63.0 in | Wt 204.0 lb

## 2011-07-29 DIAGNOSIS — R7309 Other abnormal glucose: Secondary | ICD-10-CM

## 2011-07-29 DIAGNOSIS — J449 Chronic obstructive pulmonary disease, unspecified: Secondary | ICD-10-CM

## 2011-07-29 DIAGNOSIS — J4489 Other specified chronic obstructive pulmonary disease: Secondary | ICD-10-CM

## 2011-07-29 DIAGNOSIS — R739 Hyperglycemia, unspecified: Secondary | ICD-10-CM

## 2011-07-29 DIAGNOSIS — I10 Essential (primary) hypertension: Secondary | ICD-10-CM

## 2011-07-29 LAB — BASIC METABOLIC PANEL
BUN: 19 mg/dL (ref 6–23)
Calcium: 9.2 mg/dL (ref 8.4–10.5)
Creat: 0.67 mg/dL (ref 0.50–1.10)
Potassium: 4.2 mEq/L (ref 3.5–5.3)

## 2011-07-29 LAB — CBC WITH DIFFERENTIAL/PLATELET
Basophils Absolute: 0 10*3/uL (ref 0.0–0.1)
Basophils Relative: 0 % (ref 0–1)
HCT: 39 % (ref 36.0–46.0)
MCHC: 32.6 g/dL (ref 30.0–36.0)
Monocytes Absolute: 0.3 10*3/uL (ref 0.1–1.0)
Neutro Abs: 4.5 10*3/uL (ref 1.7–7.7)
RDW: 13.6 % (ref 11.5–15.5)

## 2011-07-29 LAB — HEMOGLOBIN A1C
Hgb A1c MFr Bld: 6.8 % — ABNORMAL HIGH (ref <5.7)
Mean Plasma Glucose: 148 mg/dL — ABNORMAL HIGH (ref <117)

## 2011-07-29 NOTE — Assessment & Plan Note (Signed)
Weight increasing. Obtain chem7, a1c

## 2011-07-29 NOTE — Assessment & Plan Note (Signed)
Stable without recent exacerbation. Continue advair with post use mouth rinsing.

## 2011-07-29 NOTE — Assessment & Plan Note (Signed)
Normotensive and stable. Continue current regimen. Monitor bp as outpt and followup in clinic as scheduled. Obtain cbc and chem7 

## 2011-07-29 NOTE — Progress Notes (Signed)
  Subjective:    Patient ID: VERANIA SALBERG, female    DOB: 1951-03-14, 61 y.o.   MRN: [id number]  HPI Pt presents to clinic for followup of multiple medical problems. BP reviewed normotensive. Tolerating increase in losartan. No recent copd exacerbation. No active complaint.   No past medical history on file. Past Surgical History  Procedure Date  . Appendectomy     reports that she has quit smoking. She has never used smokeless tobacco. She reports that she does not drink alcohol or use illicit drugs. family history includes Cancer in her father. No Known Allergies    Review of Systems see hpi     Objective:   Physical Exam  Physical Exam  Nursing note and vitals reviewed. Constitutional: Appears well-developed and well-nourished. No distress.  HENT:  Head: Normocephalic and atraumatic.  Right Ear: External ear normal.  Left Ear: External ear normal.  Eyes: Conjunctivae are normal. No scleral icterus.  Neck: Neck supple. Carotid bruit is not present.  Cardiovascular: Normal rate, regular rhythm and normal heart sounds.  Exam reveals no gallop and no friction rub.   No murmur heard. Pulmonary/Chest: Effort normal and breath sounds normal. No respiratory distress. He has no wheezes. no rales.  Lymphadenopathy:    He has no cervical adenopathy.  Neurological:Alert.  Skin: Skin is warm and dry. Not diaphoretic.  Psychiatric: Has a normal mood and affect.        Assessment & Plan:

## 2011-08-13 ENCOUNTER — Other Ambulatory Visit: Payer: Self-pay | Admitting: Internal Medicine

## 2011-08-13 DIAGNOSIS — E119 Type 2 diabetes mellitus without complications: Secondary | ICD-10-CM

## 2011-08-13 MED ORDER — METFORMIN HCL 500 MG PO TABS: 500.0000 mg | ORAL_TABLET | Freq: Every day | ORAL | Status: DC

## 2011-08-19 ENCOUNTER — Telehealth: Payer: Self-pay | Admitting: Internal Medicine

## 2011-08-19 NOTE — Telephone Encounter (Signed)
Voice message received from patient requesting a return phone call regarding lab results and has questions about the message received.

## 2011-08-20 NOTE — Telephone Encounter (Signed)
Call placed to patient at [phone]

## 2011-08-20 NOTE — Telephone Encounter (Signed)
She stated she could not remember what she was instructed about her labs. She was advised per Dr Rodena Medin on blood work and follow up in 3 months. She has also request a copy of her blood work mailed to address on file.  Copy of labs from 07/29/2011 printed and mailed to patients address on file.

## 2011-10-20 ENCOUNTER — Ambulatory Visit: Payer: 59 | Admitting: Internal Medicine

## 2011-11-06 NOTE — Progress Notes (Signed)
Lab orders released/SLS 

## 2011-11-06 NOTE — Addendum Note (Signed)
Addended by: Regis Bill on: 11/06/2011 08:13 AM   Modules accepted: Orders

## 2011-11-07 LAB — BASIC METABOLIC PANEL
BUN: 18 mg/dL (ref 6–23)
Chloride: 102 mEq/L (ref 96–112)
Glucose, Bld: 113 mg/dL — ABNORMAL HIGH (ref 70–99)
Potassium: 4.8 mEq/L (ref 3.5–5.3)

## 2011-11-09 ENCOUNTER — Telehealth: Payer: Self-pay | Admitting: Internal Medicine

## 2011-11-09 ENCOUNTER — Ambulatory Visit (INDEPENDENT_AMBULATORY_CARE_PROVIDER_SITE_OTHER): Payer: 59 | Admitting: Internal Medicine

## 2011-11-09 ENCOUNTER — Encounter: Payer: Self-pay | Admitting: Internal Medicine

## 2011-11-09 VITALS — BP 138/76 | HR 89 | Temp 97.8°F | Resp 16 | Wt 199.8 lb

## 2011-11-09 DIAGNOSIS — R739 Hyperglycemia, unspecified: Secondary | ICD-10-CM

## 2011-11-09 DIAGNOSIS — I10 Essential (primary) hypertension: Secondary | ICD-10-CM

## 2011-11-09 DIAGNOSIS — R7309 Other abnormal glucose: Secondary | ICD-10-CM

## 2011-11-09 DIAGNOSIS — Z23 Encounter for immunization: Secondary | ICD-10-CM

## 2011-11-09 DIAGNOSIS — E119 Type 2 diabetes mellitus without complications: Secondary | ICD-10-CM

## 2011-11-09 DIAGNOSIS — J449 Chronic obstructive pulmonary disease, unspecified: Secondary | ICD-10-CM

## 2011-11-09 MED ORDER — OMEPRAZOLE 20 MG PO CPDR: 20.0000 mg | DELAYED_RELEASE_CAPSULE | Freq: Two times a day (BID) | ORAL | Status: DC

## 2011-11-09 MED ORDER — METFORMIN HCL 500 MG PO TABS: 500.0000 mg | ORAL_TABLET | Freq: Every day | ORAL | Status: DC

## 2011-11-09 NOTE — Assessment & Plan Note (Signed)
Pneumovax booster provided. No recent exacerbation.

## 2011-11-09 NOTE — Patient Instructions (Signed)
Please schedule fasting labs prior to next visit Cbc, lipid 401.9, chem7, a1c-hyperglycemia 

## 2011-11-09 NOTE — Assessment & Plan Note (Signed)
Improved. Continue metformin. Encouraged further weight loss/diet changes.

## 2011-11-09 NOTE — Assessment & Plan Note (Signed)
Average control. Continue arb and weight loss

## 2011-11-09 NOTE — Progress Notes (Signed)
  Subjective:    Patient ID: Savannah Stanley, female    DOB: 1951-02-17, 61 y.o.   MRN: [id number]  HPI Pt presents to clinic for followup of multiple medical problems. Weight down 5lbs since last visit with dietary changes. Hyperglycemia/mild DM improved with diet+metformin. Tolerating medication without adverse effect.   No past medical history on file. Past Surgical History  Procedure Date  . Appendectomy     reports that she has quit smoking. She has never used smokeless tobacco. She reports that she does not drink alcohol or use illicit drugs. family history includes Cancer in her father. Allergies  Allergen Reactions  . Bee Venom Shortness Of Breath    MSG.  . Other Shortness Of Breath    MSG.      Review of Systems see hpi     Objective:   Physical Exam  Physical Exam  Nursing note and vitals reviewed. Constitutional: Appears well-developed and well-nourished. No distress.  HENT:  Head: Normocephalic and atraumatic.  Right Ear: External ear normal.  Left Ear: External ear normal.  Eyes: Conjunctivae are normal. No scleral icterus.  Neck: Neck supple. Carotid bruit is not present.  Cardiovascular: Normal rate, regular rhythm and normal heart sounds.  Exam reveals no gallop and no friction rub.   No murmur heard. Pulmonary/Chest: Effort normal and breath sounds normal. No respiratory distress. He has no wheezes. no rales.  Lymphadenopathy:    He has no cervical adenopathy.  Neurological:Alert.  Skin: Skin is warm and dry. Not diaphoretic.  Psychiatric: Has a normal mood and affect.        Assessment & Plan:

## 2011-11-19 NOTE — Telephone Encounter (Signed)
Please schedule fasting labs prior to next visit  Cbc, lipid-401.9, chem7, a1c-hyperglycemia  Future orders entered and given to the lab.

## 2011-12-16 ENCOUNTER — Ambulatory Visit (INDEPENDENT_AMBULATORY_CARE_PROVIDER_SITE_OTHER): Payer: 59 | Admitting: Family

## 2011-12-16 ENCOUNTER — Encounter: Payer: Self-pay | Admitting: Family

## 2011-12-16 VITALS — BP 120/72 | HR 99 | Temp 98.2°F | Resp 18 | Wt 198.0 lb

## 2011-12-16 DIAGNOSIS — J329 Chronic sinusitis, unspecified: Secondary | ICD-10-CM

## 2011-12-16 MED ORDER — AMOXICILLIN-POT CLAVULANATE 875-125 MG PO TABS: 1.0000 | ORAL_TABLET | Freq: Two times a day (BID) | ORAL | Status: DC

## 2011-12-16 NOTE — Patient Instructions (Addendum)

## 2011-12-16 NOTE — Progress Notes (Signed)
Subjective:    Patient ID: LYNCOLN Stanley, female    DOB: Nov 06, 1950, 61 y.o.   MRN: [id number]  HPI  [name] is a 61 yr old female who presents today with chief complaint of sinus congestion.  She reports that symptoms started on Sunday 9/29.  Initially she had clear nasal drainage, but now it has become thick and yellow.  "cheeks feel sore."  Feels symptoms are worsening. Tried afrin last night.  This AM took mucinex D- which helped some.   Review of Systems See HPI  No past medical history on file.  History   Social History  . Marital Status: Widowed    Spouse Name: N/A    Number of Children: N/A  . Years of Education: N/A   Occupational History  . Not on file.   Social History Main Topics  . Smoking status: Former Games developer  . Smokeless tobacco: Never Used  . Alcohol Use: No  . Drug Use: No  . Sexually Active: Not on file   Other Topics Concern  . Not on file   Social History Narrative  . No narrative on file    Past Surgical History  Procedure Date  . Appendectomy     Family History  Problem Relation Age of Onset  . Cancer Father     prostate    Allergies  Allergen Reactions  . Bee Venom Shortness Of Breath    MSG.  . Other Shortness Of Breath    MSG.    Current Outpatient Prescriptions on File Prior to Visit  Medication Sig Dispense Refill  . citalopram (CELEXA) 40 MG tablet Take 1 tablet (40 mg total) by mouth daily.  30 tablet  11  . ferrous sulfate (FEOSOL) 325 (65 FE) MG tablet Take 325 mg by mouth daily with breakfast.        . Fluticasone-Salmeterol (ADVAIR DISKUS) 250-50 MCG/DOSE AEPB Inhale 1 puff into the lungs every 12 (twelve) hours.  180 each  3  . losartan (COZAAR) 50 MG tablet Take 1 tablet (50 mg total) by mouth daily.  30 tablet  11  . metFORMIN (GLUCOPHAGE) 500 MG tablet Take 1 tablet (500 mg total) by mouth daily with breakfast.  30 tablet  6  . omeprazole (PRILOSEC) 20 MG capsule Take 1 capsule (20 mg total) by mouth 2 (two) times  daily.  180 capsule  3  . zolpidem (AMBIEN) 5 MG tablet Take 5 mg by mouth at bedtime as needed.        BP 120/72  Pulse 99  Temp 98.2 F (36.8 C) (Oral)  Resp 18  Wt 198 lb (89.812 kg)  SpO2 97%       Objective:   Physical Exam  Constitutional: She is oriented to person, place, and time. She appears well-developed and well-nourished. No distress.  HENT:  Head: Normocephalic and atraumatic.  Right Ear: Tympanic membrane and ear canal normal.  Left Ear: Tympanic membrane and ear canal normal.  Mouth/Throat: No oropharyngeal exudate, posterior oropharyngeal edema or posterior oropharyngeal erythema.       Mild maxillary sinus tenderness to palpation.  Eyes: Conjunctivae normal are normal. Pupils are equal, round, and reactive to light.  Neck: No thyromegaly present.  Cardiovascular: Normal rate and regular rhythm.   No murmur heard. Pulmonary/Chest: Effort normal and breath sounds normal. No respiratory distress. She has no wheezes. She has no rales. She exhibits no tenderness.  Musculoskeletal: She exhibits no edema.  Lymphadenopathy:    She has  no cervical adenopathy.  Neurological: She is alert and oriented to person, place, and time.  Skin: Skin is warm and dry.  Psychiatric: She has a normal mood and affect. Her behavior is normal. Judgment and thought content normal.          Assessment & Plan:

## 2011-12-16 NOTE — Assessment & Plan Note (Signed)
Will rx with Augmentin.  Pt to call if symptoms worsen or if no improvement in 2-3 days.

## 2011-12-30 ENCOUNTER — Ambulatory Visit: Payer: 59 | Admitting: Internal Medicine

## 2012-01-25 ENCOUNTER — Ambulatory Visit (INDEPENDENT_AMBULATORY_CARE_PROVIDER_SITE_OTHER): Payer: 59 | Admitting: Internal Medicine

## 2012-01-25 ENCOUNTER — Encounter: Payer: Self-pay | Admitting: Internal Medicine

## 2012-01-25 ENCOUNTER — Ambulatory Visit: Payer: 59 | Admitting: Internal Medicine

## 2012-01-25 VITALS — BP 126/72 | HR 92 | Temp 98.5°F | Resp 16 | Wt 197.5 lb

## 2012-01-25 DIAGNOSIS — J069 Acute upper respiratory infection, unspecified: Secondary | ICD-10-CM

## 2012-01-25 MED ORDER — CITALOPRAM HYDROBROMIDE 40 MG PO TABS: 40.0000 mg | ORAL_TABLET | Freq: Every day | ORAL | Status: DC

## 2012-01-25 MED ORDER — LOSARTAN POTASSIUM 50 MG PO TABS: 50.0000 mg | ORAL_TABLET | Freq: Every day | ORAL | Status: DC

## 2012-01-25 MED ORDER — DOXYCYCLINE HYCLATE 100 MG PO TABS: 100.0000 mg | ORAL_TABLET | Freq: Two times a day (BID) | ORAL | Status: AC

## 2012-01-30 DIAGNOSIS — J069 Acute upper respiratory infection, unspecified: Secondary | ICD-10-CM | POA: Insufficient documentation

## 2012-01-30 NOTE — Assessment & Plan Note (Signed)
Given printed antibiotic. Begin antibiotic if symptoms do not improve after total duration of 8-10 days. Followup if no improvement or worsening.

## 2012-01-30 NOTE — Progress Notes (Signed)
  Subjective:    Patient ID: Savannah Stanley, female    DOB: 1951/02/20, 61 y.o.   MRN: [id number]  HPI patient presents to clinic for evaluation of possible sinusitis. Notes 4 day history of nasal and head congestion with associated ear fullness sore throat without fever chills shortness of breath or wheezing. Taking no medication for that problem. No alleviating or exacerbating factors.  No past medical history on file. Past Surgical History  Procedure Date  . Appendectomy     reports that she has quit smoking. She has never used smokeless tobacco. She reports that she does not drink alcohol or use illicit drugs. family history includes Cancer in her father. Allergies  Allergen Reactions  . Bee Venom Shortness Of Breath    MSG.  . Other Shortness Of Breath    MSG.     Review of Systems see hpi      Objective:   Physical Exam  Nursing note and vitals reviewed. Constitutional: She appears well-developed and well-nourished. No distress.  HENT:  Head: Normocephalic and atraumatic.  Right Ear: External ear normal.  Left Ear: External ear normal.  Nose: Nose normal.  Mouth/Throat: Oropharynx is clear and moist. No oropharyngeal exudate.  Eyes: Conjunctivae normal and EOM are normal. No scleral icterus.  Neck: Neck supple.  Cardiovascular: Normal rate, regular rhythm and normal heart sounds.   Pulmonary/Chest: Effort normal and breath sounds normal. No respiratory distress. She has no wheezes. She has no rales.  Lymphadenopathy:    She has no cervical adenopathy.  Neurological: She is alert.  Skin: Skin is warm and dry. She is not diaphoretic.  Psychiatric: She has a normal mood and affect.          Assessment & Plan:

## 2012-02-08 LAB — LIPID PANEL
Cholesterol: 196 mg/dL (ref 0–200)
Triglycerides: 90 mg/dL (ref <150)

## 2012-02-08 LAB — BASIC METABOLIC PANEL
BUN: 17 mg/dL (ref 6–23)
CO2: 30 mEq/L (ref 19–32)
Chloride: 102 mEq/L (ref 96–112)
Creat: 0.63 mg/dL (ref 0.50–1.10)
Potassium: 4.7 mEq/L (ref 3.5–5.3)

## 2012-02-08 NOTE — Telephone Encounter (Signed)
Lab orders released/SLS 

## 2012-02-08 NOTE — Addendum Note (Signed)
Addended by: Regis Bill on: 02/08/2012 10:28 AM   Modules accepted: Orders

## 2012-02-09 LAB — CBC WITH DIFFERENTIAL/PLATELET
Basophils Absolute: 0 10*3/uL (ref 0.0–0.1)
Basophils Relative: 1 % (ref 0–1)
Hemoglobin: 11.8 g/dL — ABNORMAL LOW (ref 12.0–15.0)
MCHC: 32.8 g/dL (ref 30.0–36.0)
Neutro Abs: 3.2 10*3/uL (ref 1.7–7.7)
Neutrophils Relative %: 54 % (ref 43–77)
Platelets: 294 10*3/uL (ref 150–400)
RDW: 14.8 % (ref 11.5–15.5)

## 2012-02-17 ENCOUNTER — Ambulatory Visit (INDEPENDENT_AMBULATORY_CARE_PROVIDER_SITE_OTHER): Payer: 59 | Admitting: Internal Medicine

## 2012-02-17 ENCOUNTER — Telehealth: Payer: Self-pay | Admitting: Internal Medicine

## 2012-02-17 ENCOUNTER — Encounter: Payer: Self-pay | Admitting: Internal Medicine

## 2012-02-17 VITALS — BP 128/78 | HR 80 | Temp 98.1°F | Resp 14 | Wt 198.2 lb

## 2012-02-17 DIAGNOSIS — E119 Type 2 diabetes mellitus without complications: Secondary | ICD-10-CM

## 2012-02-17 DIAGNOSIS — E669 Obesity, unspecified: Secondary | ICD-10-CM

## 2012-02-17 DIAGNOSIS — D649 Anemia, unspecified: Secondary | ICD-10-CM

## 2012-02-17 DIAGNOSIS — E785 Hyperlipidemia, unspecified: Secondary | ICD-10-CM

## 2012-02-17 DIAGNOSIS — E1169 Type 2 diabetes mellitus with other specified complication: Secondary | ICD-10-CM

## 2012-02-17 MED ORDER — FLUTICASONE-SALMETEROL 250-50 MCG/DOSE IN AEPB: 1.0000 | INHALATION_SPRAY | Freq: Two times a day (BID) | RESPIRATORY_TRACT | Status: DC

## 2012-02-17 MED ORDER — OMEPRAZOLE 20 MG PO CPDR: 20.0000 mg | DELAYED_RELEASE_CAPSULE | Freq: Two times a day (BID) | ORAL | Status: DC

## 2012-02-17 NOTE — Assessment & Plan Note (Signed)
Mild. Asymptomatic. No gross active bleeding. Resume iron therapy. Check CBC, ferritin and B12 in six weeks.

## 2012-02-17 NOTE — Assessment & Plan Note (Signed)
Maintaining good control. Recommend yearly diabetic eye exam. States will consider.

## 2012-02-17 NOTE — Progress Notes (Signed)
  Subjective:    Patient ID: Savannah Stanley, female    DOB: [birth year], 61 y.o.   MRN: [id number]  HPI Pt presents to clinic for followup of multiple medical problems. . No abdominal pain blood in stool or melena. History of anemia in the past and has stopped taking iron supplementation. Reviewed cholesterol above goa in the setting of diabetes.   No past medical history on file. Past Surgical History  Procedure Date  . Appendectomy     reports that she has quit smoking. She has never used smokeless tobacco. She reports that she does not drink alcohol or use illicit drugs. family history includes Cancer in her father. Allergies  Allergen Reactions  . Bee Venom Shortness Of Breath    MSG.  . Other Shortness Of Breath    MSG.      Review of Systems see hpi     Objective:   Physical Exam  Physical Exam  Nursing note and vitals reviewed. Constitutional: Appears well-developed and well-nourished. No distress.  HENT:  Head: Normocephalic and atraumatic.  Right Ear: External ear normal.  Left Ear: External ear normal.  Eyes: Conjunctivae are normal. No scleral icterus.  Neck: Neck supple. Carotid bruit is not present.  Cardiovascular: Normal rate, regular rhythm and normal heart sounds.  Exam reveals no gallop and no friction rub.   No murmur heard. Pulmonary/Chest: Effort normal and breath sounds normal. No respiratory distress. He has no wheezes. no rales.  Lymphadenopathy:    He has no cervical adenopathy.  Neurological:Alert.  Skin: Skin is warm and dry. Not diaphoretic.  Psychiatric: Has a normal mood and affect.        Assessment & Plan:

## 2012-02-17 NOTE — Telephone Encounter (Signed)
Lab order week of 03-28-2012 in about 6 weeks- cbc, ferritin, b12-anemia

## 2012-02-17 NOTE — Patient Instructions (Signed)
Please schedule non fasting labs in about 6 weeks- cbc, ferritin, b12-anemia Consider using red yeast extract for your cholesterol. It is available without a prescription

## 2012-02-17 NOTE — Assessment & Plan Note (Signed)
Recommend LDL less than one hundred with history of diabetes. Discussed statin therapy and currently not interested. Will attempt over-the-counter red yeast extract.

## 2012-03-30 LAB — CBC WITH DIFFERENTIAL/PLATELET
Basophils Relative: 0 % (ref 0–1)
Eosinophils Absolute: 0.1 10*3/uL (ref 0.0–0.7)
HCT: 38.4 % (ref 36.0–46.0)
Hemoglobin: 12.7 g/dL (ref 12.0–15.0)
Lymphs Abs: 1.8 10*3/uL (ref 0.7–4.0)
MCH: 27.3 pg (ref 26.0–34.0)
MCHC: 33.1 g/dL (ref 30.0–36.0)
Monocytes Absolute: 1 10*3/uL (ref 0.1–1.0)
Monocytes Relative: 14 % — ABNORMAL HIGH (ref 3–12)

## 2012-03-30 LAB — VITAMIN B12: Vitamin B-12: 1473 pg/mL — ABNORMAL HIGH (ref 211–911)

## 2012-03-30 NOTE — Telephone Encounter (Signed)
Lab orders placed & released to [facility]

## 2012-05-09 ENCOUNTER — Other Ambulatory Visit: Payer: Self-pay | Admitting: Internal Medicine

## 2012-05-09 ENCOUNTER — Other Ambulatory Visit: Payer: Self-pay | Admitting: *Deleted

## 2012-05-09 DIAGNOSIS — E119 Type 2 diabetes mellitus without complications: Secondary | ICD-10-CM

## 2012-05-09 MED ORDER — METFORMIN HCL 500 MG PO TABS: 500.0000 mg | ORAL_TABLET | Freq: Every day | ORAL | Status: DC

## 2012-05-09 NOTE — Telephone Encounter (Signed)
Script filled for metformin 500 mg

## 2012-05-16 ENCOUNTER — Telehealth: Payer: Self-pay | Admitting: Internal Medicine

## 2012-05-16 NOTE — Telephone Encounter (Signed)
Patient only has 6 pills left of losartan. She needs a refill sent to [facility], but also needs an emergency refill sent [facility].

## 2012-05-17 ENCOUNTER — Ambulatory Visit: Payer: 59 | Admitting: Family Medicine

## 2012-05-17 MED ORDER — LOSARTAN POTASSIUM 50 MG PO TABS: 50.0000 mg | ORAL_TABLET | Freq: Every day | ORAL | Status: DC

## 2012-05-23 ENCOUNTER — Encounter: Payer: Self-pay | Admitting: Family Medicine

## 2012-05-23 ENCOUNTER — Ambulatory Visit (INDEPENDENT_AMBULATORY_CARE_PROVIDER_SITE_OTHER): Payer: 59 | Admitting: Family Medicine

## 2012-05-23 VITALS — BP 126/84 | HR 98 | Temp 98.3°F | Ht 63.0 in | Wt 198.0 lb

## 2012-05-23 DIAGNOSIS — I10 Essential (primary) hypertension: Secondary | ICD-10-CM

## 2012-05-23 DIAGNOSIS — E119 Type 2 diabetes mellitus without complications: Secondary | ICD-10-CM

## 2012-05-23 DIAGNOSIS — J019 Acute sinusitis, unspecified: Secondary | ICD-10-CM

## 2012-05-23 DIAGNOSIS — E669 Obesity, unspecified: Secondary | ICD-10-CM

## 2012-05-23 DIAGNOSIS — E1169 Type 2 diabetes mellitus with other specified complication: Secondary | ICD-10-CM

## 2012-05-23 MED ORDER — LOSARTAN POTASSIUM 50 MG PO TABS: 50.0000 mg | ORAL_TABLET | Freq: Every day | ORAL | Status: DC

## 2012-05-23 MED ORDER — DOXYCYCLINE HYCLATE 100 MG PO TABS: 100.0000 mg | ORAL_TABLET | Freq: Two times a day (BID) | ORAL | Status: DC

## 2012-05-23 MED ORDER — METFORMIN HCL 500 MG PO TABS: 500.0000 mg | ORAL_TABLET | Freq: Every day | ORAL | Status: DC

## 2012-05-23 NOTE — Patient Instructions (Addendum)
  Try taking Iron every other day, alternate with Red Yeast Rice every other day Consider starting MegaRed caps, 1 krill oil daily by Schiff Hold iron if take Doxycycline Start a probiotic if take Doxy: Digestive Advantage or a generic  Cholesterol Cholesterol is a white, waxy, fat-like protein needed by your body in small amounts. The liver makes all the cholesterol you need. It is carried from the liver by the blood through the blood vessels. Deposits (plaque) may build up on blood vessel walls. This makes the arteries narrower and stiffer. Plaque increases the risk for heart attack and stroke. You cannot feel your cholesterol level even if it is very high. The only way to know is by a blood test to check your lipid (fats) levels. Once you know your cholesterol levels, you should keep a record of the test results. Work with your caregiver to to keep your levels in the desired range. WHAT THE RESULTS MEAN:  Total cholesterol is a rough measure of all the cholesterol in your blood.  LDL is the so-called bad cholesterol. This is the type that deposits cholesterol in the walls of the arteries. You want this level to be low.  HDL is the good cholesterol because it cleans the arteries and carries the LDL away. You want this level to be high.  Triglycerides are fat that the body can either burn for energy or store. High levels are closely linked to heart disease. DESIRED LEVELS:  Total cholesterol below 200.  LDL below 100 for people at risk, below 70 for very high risk.  HDL above 50 is good, above 60 is best.  Triglycerides below 150. HOW TO LOWER YOUR CHOLESTEROL:  Diet.  Choose fish or white meat chicken and [place], roasted or baked. Limit fatty cuts of red meat, fried foods, and processed meats, such as sausage and lunch meat.  Eat lots of fresh fruits and vegetables. Choose whole grains, beans, pasta, potatoes and cereals.  Use only small amounts of olive, corn or canola oils.  Avoid butter, mayonnaise, shortening or palm kernel oils. Avoid foods with trans-fats.  Use skim/nonfat milk and low-fat/nonfat yogurt and cheeses. Avoid whole milk, cream, ice cream, egg yolks and cheeses. Healthy desserts include angel food cake, ginger snaps, animal crackers, hard candy, popsicles, and low-fat/nonfat frozen yogurt. Avoid pastries, cakes, pies and cookies.  Exercise.  A regular program helps decrease LDL and raises HDL.  Helps with weight control.  Do things that increase your activity level like gardening, walking, or taking the stairs.  Medication.  May be prescribed by your caregiver to help lowering cholesterol and the risk for heart disease.  You may need medicine even if your levels are normal if you have several risk factors. HOME CARE INSTRUCTIONS   Follow your diet and exercise programs as suggested by your caregiver.  Take medications as directed.  Have blood work done when your caregiver feels it is necessary. MAKE SURE YOU:   Understand these instructions.  Will watch your condition.  Will get help right away if you are not doing well or get worse. Document Released: 11/25/2000 Document Revised: 05/25/2011 Document Reviewed: 05/18/2007 [facility] Patient Information 2013 [place], [place].

## 2012-05-28 ENCOUNTER — Encounter: Payer: Self-pay | Admitting: Family Medicine

## 2012-05-28 NOTE — Progress Notes (Signed)
Patient ID: Savannah Stanley, female   DOB: Jun 08, 1950, 62 y.o.   MRN: [id number] MELIYA MCCONAHY [id number] 18-Dec-1950 05/28/2012      Progress Note-Follow Up  Subjective  Chief Complaint  Chief Complaint  Patient presents with  . Follow-up    3 month    HPI  Patient is a 62 year old Caucasian female is in today for followup. Overall doing well although has had some nasal congestion. No fevers or chills. No significant headache, ear pain no pain. No chest pain no palpitations, chest congestion, shortness of breath, GI or GU concerns noted. Does struggle intermittently with insomnia but this has not worsened recently. Try to make yeast rice and this caused too much GI upset so it was discontinued  History reviewed. No pertinent past medical history.  Past Surgical History  Procedure Laterality Date  . Appendectomy      Family History  Problem Relation Age of Onset  . Cancer Father     prostate    History   Social History  . Marital Status: Widowed    Spouse Name: N/A    Number of Children: N/A  . Years of Education: N/A   Occupational History  . Not on file.   Social History Main Topics  . Smoking status: Former Games developer  . Smokeless tobacco: Never Used  . Alcohol Use: No  . Drug Use: No  . Sexually Active: Not on file   Other Topics Concern  . Not on file   Social History Narrative  . No narrative on file    Current Outpatient Prescriptions on File Prior to Visit  Medication Sig Dispense Refill  . citalopram (CELEXA) 40 MG tablet Take 1 tablet (40 mg total) by mouth daily.  90 tablet  1  . ferrous sulfate (FEOSOL) 325 (65 FE) MG tablet Take 325 mg by mouth daily with breakfast.        . Fluticasone-Salmeterol (ADVAIR DISKUS) 250-50 MCG/DOSE AEPB Inhale 1 puff into the lungs every 12 (twelve) hours.  180 each  3  . omeprazole (PRILOSEC) 20 MG capsule Take 1 capsule (20 mg total) by mouth 2 (two) times daily.  180 capsule  3   No current facility-administered  medications on file prior to visit.    Allergies  Allergen Reactions  . Bee Venom Shortness Of Breath    MSG.  . Other Shortness Of Breath    MSG.    Review of Systems  Review of Systems  Constitutional: Negative for fever and malaise/fatigue.  HENT: Negative for congestion.   Eyes: Negative for discharge.  Respiratory: Negative for shortness of breath.   Cardiovascular: Negative for chest pain, palpitations and leg swelling.  Gastrointestinal: Negative for nausea, abdominal pain and diarrhea.  Genitourinary: Negative for dysuria.  Musculoskeletal: Negative for falls.  Skin: Negative for rash.  Neurological: Negative for loss of consciousness and headaches.  Endo/Heme/Allergies: Negative for polydipsia.  Psychiatric/Behavioral: Negative for depression and suicidal ideas. The patient is not nervous/anxious and does not have insomnia.     Objective  BP 126/84  Pulse 98  Temp(Src) 98.3 F (36.8 C) (Oral)  Ht 5\' 3"  (1.6 m)  Wt 198 lb (89.812 kg)  BMI 35.08 kg/m2  SpO2 97%  Physical Exam  Physical Exam  Constitutional: She is oriented to person, place, and time and well-developed, well-nourished, and in no distress. No distress.  HENT:  Head: Normocephalic and atraumatic.  Eyes: Conjunctivae are normal.  Neck: Neck supple. No thyromegaly present.  Cardiovascular: Normal  rate, regular rhythm and normal heart sounds.   No murmur heard. Pulmonary/Chest: Effort normal and breath sounds normal. She has no wheezes.  Abdominal: She exhibits no distension and no mass.  Musculoskeletal: She exhibits no edema.  Lymphadenopathy:    She has no cervical adenopathy.  Neurological: She is alert and oriented to person, place, and time.  Skin: Skin is warm and dry. No rash noted. She is not diaphoretic.  Psychiatric: Memory, affect and judgment normal.    No results found for this basename: TSH   Lab Results  Component Value Date   WBC 7.2 03/30/2012   HGB 12.7 03/30/2012    HCT 38.4 03/30/2012   MCV 82.6 03/30/2012   PLT 225 03/30/2012   Lab Results  Component Value Date   CREATININE 0.63 02/08/2012   BUN 17 02/08/2012   NA 138 02/08/2012   K 4.7 02/08/2012   CL 102 02/08/2012   CO2 30 02/08/2012   Lab Results  Component Value Date   ALT 30 07/31/2010   AST 33 07/31/2010   ALKPHOS 50 07/31/2010   BILITOT 0.2* 07/31/2010   Lab Results  Component Value Date   CHOL 196 02/08/2012   Lab Results  Component Value Date   HDL 60 02/08/2012   Lab Results  Component Value Date   LDLCALC 118* 02/08/2012   Lab Results  Component Value Date   TRIG 90 02/08/2012   Lab Results  Component Value Date   CHOLHDL 3.3 02/08/2012     Assessment & Plan  Hypertension Well controlled, no changes to meds.   Diabetes mellitus type 2 in obese Recent hgba1c 6.6, minimize simple carbs and continue current meds.

## 2012-05-28 NOTE — Assessment & Plan Note (Signed)
Recent hgba1c 6.6, minimize simple carbs and continue current meds.

## 2012-05-28 NOTE — Assessment & Plan Note (Signed)
Well controlled, no changes to meds 

## 2012-07-18 ENCOUNTER — Other Ambulatory Visit: Payer: Self-pay | Admitting: Internal Medicine

## 2012-08-19 ENCOUNTER — Telehealth: Payer: Self-pay

## 2012-08-19 DIAGNOSIS — E1169 Type 2 diabetes mellitus with other specified complication: Secondary | ICD-10-CM

## 2012-08-19 DIAGNOSIS — I10 Essential (primary) hypertension: Secondary | ICD-10-CM

## 2012-08-19 DIAGNOSIS — E785 Hyperlipidemia, unspecified: Secondary | ICD-10-CM

## 2012-08-19 LAB — RENAL FUNCTION PANEL
CO2: 25 mEq/L (ref 19–32)
Calcium: 9.2 mg/dL (ref 8.4–10.5)
Chloride: 101 mEq/L (ref 96–112)
Glucose, Bld: 126 mg/dL — ABNORMAL HIGH (ref 70–99)
Potassium: 4.2 mEq/L (ref 3.5–5.3)
Sodium: 136 mEq/L (ref 135–145)

## 2012-08-19 LAB — CBC
HCT: 39.6 % (ref 36.0–46.0)
Hemoglobin: 13.5 g/dL (ref 12.0–15.0)
MCH: 29 pg (ref 26.0–34.0)
MCV: 85.2 fL (ref 78.0–100.0)
RBC: 4.65 MIL/uL (ref 3.87–5.11)

## 2012-08-19 LAB — TSH: TSH: 0.866 u[IU]/mL (ref 0.350–4.500)

## 2012-08-19 LAB — HEPATIC FUNCTION PANEL
AST: 15 U/L (ref 0–37)
Alkaline Phosphatase: 52 U/L (ref 39–117)
Indirect Bilirubin: 0.4 mg/dL (ref 0.0–0.9)
Total Protein: 6.5 g/dL (ref 6.0–8.3)

## 2012-08-19 LAB — LIPID PANEL: Cholesterol: 199 mg/dL (ref 0–200)

## 2012-08-19 LAB — HEMOGLOBIN A1C
Hgb A1c MFr Bld: 6.4 % — ABNORMAL HIGH (ref <5.7)
Mean Plasma Glucose: 137 mg/dL — ABNORMAL HIGH (ref <117)

## 2012-08-19 NOTE — Telephone Encounter (Signed)
Labs ordered.

## 2012-08-25 ENCOUNTER — Encounter: Payer: Self-pay | Admitting: Family Medicine

## 2012-08-25 ENCOUNTER — Ambulatory Visit (INDEPENDENT_AMBULATORY_CARE_PROVIDER_SITE_OTHER): Payer: 59 | Admitting: Family Medicine

## 2012-08-25 VITALS — BP 122/82 | HR 88 | Temp 98.2°F | Ht 63.0 in | Wt 200.0 lb

## 2012-08-25 DIAGNOSIS — I10 Essential (primary) hypertension: Secondary | ICD-10-CM

## 2012-08-25 DIAGNOSIS — K219 Gastro-esophageal reflux disease without esophagitis: Secondary | ICD-10-CM

## 2012-08-25 DIAGNOSIS — E669 Obesity, unspecified: Secondary | ICD-10-CM

## 2012-08-25 DIAGNOSIS — E119 Type 2 diabetes mellitus without complications: Secondary | ICD-10-CM

## 2012-08-25 DIAGNOSIS — E1169 Type 2 diabetes mellitus with other specified complication: Secondary | ICD-10-CM

## 2012-08-25 DIAGNOSIS — J449 Chronic obstructive pulmonary disease, unspecified: Secondary | ICD-10-CM

## 2012-08-25 DIAGNOSIS — J019 Acute sinusitis, unspecified: Secondary | ICD-10-CM

## 2012-08-25 MED ORDER — DOXYCYCLINE HYCLATE 100 MG PO TABS: 100.0000 mg | ORAL_TABLET | Freq: Two times a day (BID) | ORAL | Status: DC

## 2012-08-25 NOTE — Progress Notes (Signed)
Patient ID: Savannah Stanley, female   DOB: 02-10-1951, 62 y.o.   MRN: [id number] SAKAI HEINLE [id number] October 09, 1950 08/25/2012      Progress Note-Follow Up  Subjective  Chief Complaint  Chief Complaint  Patient presents with  . Follow-up    HPI  Patient is a 62 year old Caucasian female who is in today for followup. Overall she feels well. She's been trying to decrease her carbohydrate intake but acknowledges some issues that in others. She reports her partner is well controlled on omeprazole. She denies any recent illness. She denies any flares in her COPD. Does note since last visit she did have a sinus infection and took a course of doxycycline but feels well now. No headache or congestion. No chest pain, palpitations, shortness of breath, GI or GU complaints noted today.  History reviewed. No pertinent past medical history.  Past Surgical History  Procedure Laterality Date  . Appendectomy      Family History  Problem Relation Age of Onset  . Cancer Father     prostate    History   Social History  . Marital Status: Widowed    Spouse Name: N/A    Number of Children: N/A  . Years of Education: N/A   Occupational History  . Not on file.   Social History Main Topics  . Smoking status: Former Games developer  . Smokeless tobacco: Never Used  . Alcohol Use: No  . Drug Use: No  . Sexually Active: Not on file   Other Topics Concern  . Not on file   Social History Narrative  . No narrative on file    Current Outpatient Prescriptions on File Prior to Visit  Medication Sig Dispense Refill  . citalopram (CELEXA) 40 MG tablet TAKE 1 TABLET (40 MG TOTAL) BY MOUTH DAILY.  90 tablet  0  . ferrous sulfate (FEOSOL) 325 (65 FE) MG tablet Take 325 mg by mouth daily with breakfast.        . Fluticasone-Salmeterol (ADVAIR DISKUS) 250-50 MCG/DOSE AEPB Inhale 1 puff into the lungs every 12 (twelve) hours.  180 each  3  . losartan (COZAAR) 50 MG tablet Take 1 tablet (50 mg total) by mouth  daily.  30 tablet  0  . metFORMIN (GLUCOPHAGE) 500 MG tablet Take 1 tablet (500 mg total) by mouth daily with breakfast.  90 tablet  1  . omeprazole (PRILOSEC) 20 MG capsule Take 1 capsule (20 mg total) by mouth 2 (two) times daily.  180 capsule  3   No current facility-administered medications on file prior to visit.    Allergies  Allergen Reactions  . Bee Venom Shortness Of Breath    MSG.  . Other Shortness Of Breath    MSG.    Review of Systems  Review of Systems  Constitutional: Negative for fever and malaise/fatigue.  HENT: Negative for congestion.   Eyes: Negative for discharge.  Respiratory: Negative for shortness of breath.   Cardiovascular: Negative for chest pain, palpitations and leg swelling.  Gastrointestinal: Negative for nausea, abdominal pain and diarrhea.  Genitourinary: Negative for dysuria.  Musculoskeletal: Negative for falls.  Skin: Negative for rash.  Neurological: Negative for loss of consciousness and headaches.  Endo/Heme/Allergies: Negative for polydipsia.  Psychiatric/Behavioral: Negative for depression and suicidal ideas. The patient is not nervous/anxious and does not have insomnia.     Objective  BP 122/82  Pulse 88  Temp(Src) 98.2 F (36.8 C) (Oral)  Ht 5\' 3"  (1.6 m)  Wt 200 lb  0.6 oz (90.738 kg)  BMI 35.44 kg/m2  SpO2 96%  Physical Exam  Physical Exam  Constitutional: She is oriented to person, place, and time and well-developed, well-nourished, and in no distress. No distress.  HENT:  Head: Normocephalic and atraumatic.  Eyes: Conjunctivae are normal.  Neck: Neck supple. No thyromegaly present.  Cardiovascular: Normal rate, regular rhythm and normal heart sounds.   No murmur heard. Pulmonary/Chest: Effort normal and breath sounds normal. She has no wheezes.  Abdominal: She exhibits no distension and no mass.  Musculoskeletal: She exhibits no edema.  Lymphadenopathy:    She has no cervical adenopathy.  Neurological: She is  alert and oriented to person, place, and time.  Skin: Skin is warm and dry. No rash noted. She is not diaphoretic.  Psychiatric: Memory, affect and judgment normal.    Lab Results  Component Value Date   TSH 0.866 08/19/2012   Lab Results  Component Value Date   WBC 5.6 08/19/2012   HGB 13.5 08/19/2012   HCT 39.6 08/19/2012   MCV 85.2 08/19/2012   PLT 234 08/19/2012   Lab Results  Component Value Date   CREATININE 0.60 08/19/2012   BUN 16 08/19/2012   NA 136 08/19/2012   K 4.2 08/19/2012   CL 101 08/19/2012   CO2 25 08/19/2012   Lab Results  Component Value Date   ALT 13 08/19/2012   AST 15 08/19/2012   ALKPHOS 52 08/19/2012   BILITOT 0.5 08/19/2012   Lab Results  Component Value Date   CHOL 199 08/19/2012   Lab Results  Component Value Date   HDL 52 08/19/2012   Lab Results  Component Value Date   LDLCALC 115* 08/19/2012   Lab Results  Component Value Date   TRIG 159* 08/19/2012   Lab Results  Component Value Date   CHOLHDL 3.8 08/19/2012     Assessment & Plan  Hypertension Well controlled no changes  Diabetes mellitus type 2 in obese hgba1c down to 6.4, patient offered glucometer today to monitor and declines, agrees to continue trying to decrease carb intake and increase exercise  COPD (chronic obstructive pulmonary disease) No recent flares, did recently take a course of Doxycycline for a sinus infection but feels well now  Esophageal reflux Well controlled on Omeprazole avoid offending foods

## 2012-08-25 NOTE — Assessment & Plan Note (Signed)
Well controlled no changes 

## 2012-08-25 NOTE — Assessment & Plan Note (Signed)
hgba1c down to 6.4, patient offered glucometer today to monitor and declines, agrees to continue trying to decrease carb intake and increase exercise

## 2012-08-25 NOTE — Assessment & Plan Note (Signed)
No recent flares, did recently take a course of Doxycycline for a sinus infection but feels well now

## 2012-08-25 NOTE — Assessment & Plan Note (Signed)
Well controlled on Omeprazole avoid offending foods

## 2012-08-25 NOTE — Patient Instructions (Addendum)
Next visit, annual, lipid, renal, tsh, hepatic, hgba1c, microalb, cbc   Hypertension As your heart beats, it forces blood through your arteries. This force is your blood pressure. If the pressure is too high, it is called hypertension (HTN) or high blood pressure. HTN is dangerous because you may have it and not know it. High blood pressure may mean that your heart has to work harder to pump blood. Your arteries may be narrow or stiff. The extra work puts you at risk for heart disease, stroke, and other problems.  Blood pressure consists of two numbers, a higher number over a lower, 110/72, for example. It is stated as "110 over 72." The ideal is below 120 for the top number (systolic) and under 80 for the bottom (diastolic). Write down your blood pressure today. You should pay close attention to your blood pressure if you have certain conditions such as:  Heart failure.  Prior heart attack.  Diabetes  Chronic kidney disease.  Prior stroke.  Multiple risk factors for heart disease. To see if you have HTN, your blood pressure should be measured while you are seated with your arm held at the level of the heart. It should be measured at least twice. A one-time elevated blood pressure reading (especially in the Emergency Department) does not mean that you need treatment. There may be conditions in which the blood pressure is different between your right and left arms. It is important to see your caregiver soon for a recheck. Most people have essential hypertension which means that there is not a specific cause. This type of high blood pressure may be lowered by changing lifestyle factors such as:  Stress.  Smoking.  Lack of exercise.  Excessive weight.  Drug/tobacco/alcohol use.  Eating less salt. Most people do not have symptoms from high blood pressure until it has caused damage to the body. Effective treatment can often prevent, delay or reduce that damage. TREATMENT  When a cause  has been identified, treatment for high blood pressure is directed at the cause. There are a large number of medications to treat HTN. These fall into several categories, and your caregiver will help you select the medicines that are best for you. Medications may have side effects. You should review side effects with your caregiver. If your blood pressure stays high after you have made lifestyle changes or started on medicines,   Your medication(s) may need to be changed.  Other problems may need to be addressed.  Be certain you understand your prescriptions, and know how and when to take your medicine.  Be sure to follow up with your caregiver within the time frame advised (usually within two weeks) to have your blood pressure rechecked and to review your medications.  If you are taking more than one medicine to lower your blood pressure, make sure you know how and at what times they should be taken. Taking two medicines at the same time can result in blood pressure that is too low. SEEK IMMEDIATE MEDICAL CARE IF:  You develop a severe headache, blurred or changing vision, or confusion.  You have unusual weakness or numbness, or a faint feeling.  You have severe chest or abdominal pain, vomiting, or breathing problems. MAKE SURE YOU:   Understand these instructions.  Will watch your condition.  Will get help right away if you are not doing well or get worse. Document Released: 03/02/2005 Document Revised: 05/25/2011 Document Reviewed: 10/21/2007 [facility] Patient Information 2014 [place], [place].

## 2012-10-06 ENCOUNTER — Other Ambulatory Visit: Payer: Self-pay | Admitting: Family Medicine

## 2012-10-16 ENCOUNTER — Other Ambulatory Visit: Payer: Self-pay | Admitting: Family Medicine

## 2012-11-16 ENCOUNTER — Other Ambulatory Visit: Payer: Self-pay | Admitting: Family Medicine

## 2012-11-18 ENCOUNTER — Ambulatory Visit (INDEPENDENT_AMBULATORY_CARE_PROVIDER_SITE_OTHER): Payer: 59 | Admitting: Physician Assistant

## 2012-11-18 ENCOUNTER — Encounter: Payer: Self-pay | Admitting: Physician Assistant

## 2012-11-18 VITALS — BP 124/72 | HR 103 | Temp 98.5°F | Resp 14 | Wt 200.2 lb

## 2012-11-18 DIAGNOSIS — H109 Unspecified conjunctivitis: Secondary | ICD-10-CM

## 2012-11-18 NOTE — Progress Notes (Signed)
Patient ID: Savannah Stanley, female   DOB: [birth year], 62 y.o.   MRN: [id number]  Patient presents to clinic today c/o red R eye for the past 3 days.  Patient states she has noticed sensation of something in her R eye about 3 days ago.  Since then she has noted some eye redness and tenderness.  Denies itchy eyes, denies discharge, denies photophobia, eye pain or change in vision.  Has been using artificial tears with some relief of symptoms.  Denies recent illness.  Denies seasonal allergies.  Denies trauma to her eye.  Does note some grass got in her eye the other day.  Denies fever, chills, sweats.  Denies difficulty opening eye in the am.  Denies purulent drainage. Denies symptoms in L eye.  Patient has history of hypertension and does endorse occassional sneezing.  Past Medical History  Diagnosis Date  . Esophageal reflux 08/25/2012    Current Outpatient Prescriptions on File Prior to Visit  Medication Sig Dispense Refill  . citalopram (CELEXA) 40 MG tablet TAKE 1 TABLET (40 MG TOTAL) BY MOUTH DAILY.  90 tablet  0  . ferrous sulfate (FEOSOL) 325 (65 FE) MG tablet Take 325 mg by mouth daily with breakfast.        . Fluticasone-Salmeterol (ADVAIR DISKUS) 250-50 MCG/DOSE AEPB Inhale 1 puff into the lungs every 12 (twelve) hours.  180 each  3  . losartan (COZAAR) 50 MG tablet TAKE 1 TABLET BY MOUTH DAILY  30 tablet  3  . metFORMIN (GLUCOPHAGE) 500 MG tablet TAKE 1 TABLET (500 MG TOTAL) BY MOUTH DAILY WITH BREAKFAST.  60 tablet  0  . omeprazole (PRILOSEC) 20 MG capsule Take 1 capsule (20 mg total) by mouth 2 (two) times daily.  180 capsule  3   No current facility-administered medications on file prior to visit.    Allergies  Allergen Reactions  . Bee Venom Shortness Of Breath    MSG.  . Other Shortness Of Breath    MSG.    Family History  Problem Relation Age of Onset  . Cancer Father     prostate    History   Social History  . Marital Status: Widowed    Spouse Name: N/A    Number  of Children: N/A  . Years of Education: N/A   Social History Main Topics  . Smoking status: Former Games developer  . Smokeless tobacco: Never Used  . Alcohol Use: No  . Drug Use: No  . Sexual Activity: None   Other Topics Concern  . None   Social History Narrative  . None   Review of Systems  Constitutional: Negative for fever, chills and malaise/fatigue.  HENT: Negative for congestion and sore throat.   Eyes: Positive for redness. Negative for blurred vision, double vision, photophobia, pain and discharge.  Respiratory: Negative for cough, shortness of breath and wheezing.   Musculoskeletal: Negative for myalgias.  Skin: Negative for rash.  Neurological: Negative for headaches.  Endo/Heme/Allergies: Positive for environmental allergies.   Filed Vitals:   11/18/12 1025  BP: 124/72  Pulse: 103  Temp: 98.5 F (36.9 C)  Resp: 14   Physical Exam  Vitals reviewed. Constitutional: She is oriented to person, place, and time and well-developed, well-nourished, and in no distress.  HENT:  Head: Normocephalic and atraumatic.  Nose: Nose normal.  Mouth/Throat: Oropharynx is clear and moist.  Eyes: EOM are normal. Pupils are equal, round, and reactive to light. Right eye exhibits no discharge. Left eye exhibits no  discharge. No scleral icterus.  Lateral aspect of sclera of R eye with small area of redness, extending partially underneath iris. Normal ROM of eyes bilaterally without pain.  No evidence of cobblestoning or drainage noted.  Conjunctiva of L eye clear without irritation or drainage.  Neck: Normal range of motion.  Lymphadenopathy:    She has no cervical adenopathy.  Neurological: She is alert and oriented to person, place, and time.  Skin: Skin is warm and dry. No rash noted.   Assessment/Plan: Conjunctivitis Most likely irritant-induced.  No evidence of drainage on exam. No photophobia or visual changes. Artificial tears for lubrication.  Warm compresses to eye.  Patient to  call if symptoms persist, worsen or if drainage develops.

## 2012-11-18 NOTE — Assessment & Plan Note (Signed)
Most likely irritant-induced.  No evidence of drainage on exam. No photophobia or visual changes. Artificial tears for lubrication.  Warm compresses to eye.  Patient to call if symptoms persist, worsen or if drainage develops.

## 2012-11-18 NOTE — Patient Instructions (Signed)
Please continue moisturizing drops to eye twice daily.  Can apply cool compress to eye if swelling or itching develop.  Symptoms should resolve in a few days.  If symptoms persist, worsen, or you develop changes in vision, you need to call us

## 2013-01-05 ENCOUNTER — Other Ambulatory Visit: Payer: Self-pay | Admitting: Internal Medicine

## 2013-01-05 ENCOUNTER — Other Ambulatory Visit: Payer: Self-pay | Admitting: Family Medicine

## 2013-01-05 NOTE — Telephone Encounter (Signed)
Rx request to pharmacy/[facility]  

## 2013-01-15 ENCOUNTER — Other Ambulatory Visit: Payer: Self-pay | Admitting: Family Medicine

## 2013-01-18 ENCOUNTER — Inpatient Hospital Stay (HOSPITAL_COMMUNITY): Payer: 59

## 2013-01-18 ENCOUNTER — Inpatient Hospital Stay (HOSPITAL_BASED_OUTPATIENT_CLINIC_OR_DEPARTMENT_OTHER)
Admission: EM | Admit: 2013-01-18 | Discharge: 2013-01-26 | DRG: 668 | Disposition: A | Discharge status: Home / Self Care | Payer: 59 | Attending: Internal Medicine | Admitting: Internal Medicine

## 2013-01-18 ENCOUNTER — Encounter (HOSPITAL_BASED_OUTPATIENT_CLINIC_OR_DEPARTMENT_OTHER): Payer: Self-pay | Admitting: Emergency Medicine

## 2013-01-18 ENCOUNTER — Emergency Department (HOSPITAL_BASED_OUTPATIENT_CLINIC_OR_DEPARTMENT_OTHER): Payer: 59

## 2013-01-18 DIAGNOSIS — R531 Weakness: Secondary | ICD-10-CM

## 2013-01-18 DIAGNOSIS — R310 Gross hematuria: Secondary | ICD-10-CM | POA: Diagnosis present

## 2013-01-18 DIAGNOSIS — E1169 Type 2 diabetes mellitus with other specified complication: Secondary | ICD-10-CM | POA: Diagnosis present

## 2013-01-18 DIAGNOSIS — J449 Chronic obstructive pulmonary disease, unspecified: Secondary | ICD-10-CM | POA: Diagnosis present

## 2013-01-18 DIAGNOSIS — I10 Essential (primary) hypertension: Secondary | ICD-10-CM

## 2013-01-18 DIAGNOSIS — D494 Neoplasm of unspecified behavior of bladder: Secondary | ICD-10-CM

## 2013-01-18 DIAGNOSIS — E785 Hyperlipidemia, unspecified: Secondary | ICD-10-CM | POA: Diagnosis present

## 2013-01-18 DIAGNOSIS — R000 Tachycardia, unspecified: Secondary | ICD-10-CM | POA: Diagnosis present

## 2013-01-18 DIAGNOSIS — E119 Type 2 diabetes mellitus without complications: Secondary | ICD-10-CM | POA: Diagnosis present

## 2013-01-18 DIAGNOSIS — E876 Hypokalemia: Secondary | ICD-10-CM | POA: Diagnosis not present

## 2013-01-18 DIAGNOSIS — C679 Malignant neoplasm of bladder, unspecified: Principal | ICD-10-CM | POA: Diagnosis present

## 2013-01-18 DIAGNOSIS — I498 Other specified cardiac arrhythmias: Secondary | ICD-10-CM | POA: Diagnosis present

## 2013-01-18 DIAGNOSIS — F329 Major depressive disorder, single episode, unspecified: Secondary | ICD-10-CM | POA: Diagnosis present

## 2013-01-18 DIAGNOSIS — R195 Other fecal abnormalities: Secondary | ICD-10-CM

## 2013-01-18 DIAGNOSIS — F32A Depression, unspecified: Secondary | ICD-10-CM | POA: Diagnosis present

## 2013-01-18 DIAGNOSIS — K219 Gastro-esophageal reflux disease without esophagitis: Secondary | ICD-10-CM | POA: Diagnosis present

## 2013-01-18 DIAGNOSIS — B9620 Unspecified Escherichia coli [E. coli] as the cause of diseases classified elsewhere: Secondary | ICD-10-CM | POA: Diagnosis present

## 2013-01-18 DIAGNOSIS — C680 Malignant neoplasm of urethra: Secondary | ICD-10-CM | POA: Diagnosis present

## 2013-01-18 DIAGNOSIS — Z87891 Personal history of nicotine dependence: Secondary | ICD-10-CM

## 2013-01-18 DIAGNOSIS — A498 Other bacterial infections of unspecified site: Secondary | ICD-10-CM | POA: Diagnosis present

## 2013-01-18 DIAGNOSIS — N3289 Other specified disorders of bladder: Secondary | ICD-10-CM | POA: Diagnosis not present

## 2013-01-18 DIAGNOSIS — D649 Anemia, unspecified: Secondary | ICD-10-CM

## 2013-01-18 DIAGNOSIS — E669 Obesity, unspecified: Secondary | ICD-10-CM | POA: Diagnosis present

## 2013-01-18 DIAGNOSIS — D62 Acute posthemorrhagic anemia: Secondary | ICD-10-CM | POA: Diagnosis not present

## 2013-01-18 DIAGNOSIS — F3289 Other specified depressive episodes: Secondary | ICD-10-CM | POA: Diagnosis present

## 2013-01-18 DIAGNOSIS — R5381 Other malaise: Secondary | ICD-10-CM | POA: Diagnosis present

## 2013-01-18 DIAGNOSIS — N390 Urinary tract infection, site not specified: Secondary | ICD-10-CM

## 2013-01-18 DIAGNOSIS — J4489 Other specified chronic obstructive pulmonary disease: Secondary | ICD-10-CM | POA: Diagnosis present

## 2013-01-18 HISTORY — DX: Essential (primary) hypertension: I10

## 2013-01-18 HISTORY — DX: Chronic obstructive pulmonary disease, unspecified: J44.9

## 2013-01-18 LAB — CBC WITH DIFFERENTIAL/PLATELET
Basophils Absolute: 0 10*3/uL (ref 0.0–0.1)
Eosinophils Relative: 0 % (ref 0–5)
Hemoglobin: 4.5 g/dL — CL (ref 12.0–15.0)
Lymphocytes Relative: 23 % (ref 12–46)
Lymphs Abs: 1.8 10*3/uL (ref 0.7–4.0)
MCH: 28.1 pg (ref 26.0–34.0)
MCV: 90 fL (ref 78.0–100.0)
Monocytes Relative: 10 % (ref 3–12)
Neutro Abs: 5.1 10*3/uL (ref 1.7–7.7)
Neutrophils Relative %: 67 % (ref 43–77)
Platelets: 276 10*3/uL (ref 150–400)
RDW: 13.1 % (ref 11.5–15.5)
WBC: 7.7 10*3/uL (ref 4.0–10.5)

## 2013-01-18 LAB — COMPREHENSIVE METABOLIC PANEL
AST: 12 U/L (ref 0–37)
Alkaline Phosphatase: 38 U/L — ABNORMAL LOW (ref 39–117)
BUN: 13 mg/dL (ref 6–23)
CO2: 25 mEq/L (ref 19–32)
Calcium: 8.6 mg/dL (ref 8.4–10.5)
Chloride: 101 mEq/L (ref 96–112)
Creatinine, Ser: 0.61 mg/dL (ref 0.50–1.10)
GFR calc Af Amer: >90 mL/min (ref >90)
GFR calc non Af Amer: >90 mL/min (ref >90)
Glucose, Bld: 133 mg/dL — ABNORMAL HIGH (ref 70–99)
Potassium: 3.5 mEq/L (ref 3.5–5.1)
Total Bilirubin: 0.2 mg/dL — ABNORMAL LOW (ref 0.3–1.2)

## 2013-01-18 LAB — CG4 I-STAT (LACTIC ACID): Lactic Acid, Venous: 1.76 mmol/L (ref 0.5–2.2)

## 2013-01-18 LAB — VITAMIN B12: Vitamin B-12: 1875 pg/mL — ABNORMAL HIGH (ref 211–911)

## 2013-01-18 LAB — PROTIME-INR: Prothrombin Time: 13.4 seconds (ref 11.6–15.2)

## 2013-01-18 LAB — OCCULT BLOOD X 1 CARD TO LAB, STOOL: Fecal Occult Bld: POSITIVE — AB

## 2013-01-18 LAB — PRO B NATRIURETIC PEPTIDE: Pro B Natriuretic peptide (BNP): 160.9 pg/mL — ABNORMAL HIGH (ref 0–125)

## 2013-01-18 LAB — URINALYSIS, ROUTINE W REFLEX MICROSCOPIC
Glucose, UA: NEGATIVE mg/dL
Protein, ur: >300 mg/dL — AB
Urobilinogen, UA: 1 mg/dL (ref 0.0–1.0)
pH: 6.5 (ref 5.0–8.0)

## 2013-01-18 LAB — BASIC METABOLIC PANEL
BUN: 15 mg/dL (ref 6–23)
Creatinine, Ser: 0.6 mg/dL (ref 0.50–1.10)
GFR calc Af Amer: >90 mL/min (ref >90)
GFR calc non Af Amer: >90 mL/min (ref >90)
Glucose, Bld: 167 mg/dL — ABNORMAL HIGH (ref 70–99)

## 2013-01-18 LAB — CBC
HCT: 14.8 % — ABNORMAL LOW (ref 36.0–46.0)
MCH: 28 pg (ref 26.0–34.0)
MCHC: 29.7 g/dL — ABNORMAL LOW (ref 30.0–36.0)
MCV: 94.3 fL (ref 78.0–100.0)
RDW: 12.6 % (ref 11.5–15.5)

## 2013-01-18 LAB — GLUCOSE, CAPILLARY: Glucose-Capillary: 153 mg/dL — ABNORMAL HIGH (ref 70–99)

## 2013-01-18 LAB — FOLATE: Folate: >20 ng/mL

## 2013-01-18 LAB — APTT: aPTT: 28 seconds (ref 24–37)

## 2013-01-18 LAB — ABO/RH: ABO/RH(D): A POS

## 2013-01-18 LAB — URINE MICROSCOPIC-ADD ON

## 2013-01-18 LAB — IRON AND TIBC

## 2013-01-18 MED ORDER — LOSARTAN POTASSIUM 50 MG PO TABS
50.0000 mg | ORAL_TABLET | Freq: Every day | ORAL | Status: DC
  Administered 2013-01-19 – 2013-01-26 (×7): 50 mg via ORAL
  Filled 2013-01-18 (×8): qty 1

## 2013-01-18 MED ORDER — FUROSEMIDE 10 MG/ML IJ SOLN
20.0000 mg | Freq: Once | INTRAMUSCULAR | Status: AC
  Administered 2013-01-19: 20 mg via INTRAVENOUS
  Filled 2013-01-18: qty 2

## 2013-01-18 MED ORDER — DIPHENHYDRAMINE HCL 25 MG PO CAPS
25.0000 mg | ORAL_CAPSULE | Freq: Once | ORAL | Status: AC
  Administered 2013-01-18: 25 mg via ORAL
  Filled 2013-01-18: qty 1

## 2013-01-18 MED ORDER — ONDANSETRON HCL 4 MG/2ML IJ SOLN
4.0000 mg | Freq: Four times a day (QID) | INTRAMUSCULAR | Status: DC | PRN
  Administered 2013-01-22: 4 mg via INTRAVENOUS
  Filled 2013-01-18: qty 2

## 2013-01-18 MED ORDER — IPRATROPIUM BROMIDE 0.02 % IN SOLN: 0.5000 mg | RESPIRATORY_TRACT | Status: DC | PRN

## 2013-01-18 MED ORDER — OXYCODONE HCL 5 MG PO TABS
5.0000 mg | ORAL_TABLET | ORAL | Status: DC | PRN
  Administered 2013-01-23 – 2013-01-25 (×3): 5 mg via ORAL
  Filled 2013-01-18 (×3): qty 1

## 2013-01-18 MED ORDER — DEXTROSE 5 % IV SOLN
1.0000 g | INTRAVENOUS | Status: DC
  Administered 2013-01-18 – 2013-01-20 (×3): 1 g via INTRAVENOUS
  Filled 2013-01-18 (×3): qty 10

## 2013-01-18 MED ORDER — POTASSIUM CHLORIDE CRYS ER 20 MEQ PO TBCR
40.0000 meq | EXTENDED_RELEASE_TABLET | Freq: Once | ORAL | Status: AC
  Administered 2013-01-18: 40 meq via ORAL
  Filled 2013-01-18: qty 2

## 2013-01-18 MED ORDER — MOMETASONE FURO-FORMOTEROL FUM 100-5 MCG/ACT IN AERO
2.0000 | INHALATION_SPRAY | Freq: Two times a day (BID) | RESPIRATORY_TRACT | Status: DC
  Administered 2013-01-19 – 2013-01-26 (×14): 2 via RESPIRATORY_TRACT
  Filled 2013-01-18 (×3): qty 8.8

## 2013-01-18 MED ORDER — GI COCKTAIL ~~LOC~~
30.0000 mL | Freq: Three times a day (TID) | ORAL | Status: DC | PRN
  Administered 2013-01-19: 30 mL via ORAL
  Filled 2013-01-18 (×2): qty 30

## 2013-01-18 MED ORDER — INSULIN ASPART 100 UNIT/ML ~~LOC~~ SOLN
0.0000 [IU] | Freq: Three times a day (TID) | SUBCUTANEOUS | Status: DC
  Administered 2013-01-18: 2 [IU] via SUBCUTANEOUS
  Administered 2013-01-19 – 2013-01-20 (×6): 1 [IU] via SUBCUTANEOUS
  Administered 2013-01-21 (×2): 2 [IU] via SUBCUTANEOUS
  Administered 2013-01-22 (×2): 3 [IU] via SUBCUTANEOUS
  Administered 2013-01-24 (×3): 2 [IU] via SUBCUTANEOUS
  Administered 2013-01-25: 3 [IU] via SUBCUTANEOUS
  Administered 2013-01-25: 1 [IU] via SUBCUTANEOUS

## 2013-01-18 MED ORDER — PANTOPRAZOLE SODIUM 40 MG IV SOLR
40.0000 mg | INTRAVENOUS | Status: DC
  Administered 2013-01-18: 40 mg via INTRAVENOUS
  Filled 2013-01-18 (×2): qty 40

## 2013-01-18 MED ORDER — ALBUTEROL SULFATE (5 MG/ML) 0.5% IN NEBU: 2.5000 mg | INHALATION_SOLUTION | RESPIRATORY_TRACT | Status: DC | PRN

## 2013-01-18 MED ORDER — ACETAMINOPHEN 325 MG PO TABS
650.0000 mg | ORAL_TABLET | Freq: Four times a day (QID) | ORAL | Status: DC | PRN
  Administered 2013-01-20: 650 mg via ORAL
  Filled 2013-01-18: qty 2

## 2013-01-18 MED ORDER — ACETAMINOPHEN 325 MG PO TABS
650.0000 mg | ORAL_TABLET | Freq: Once | ORAL | Status: AC
  Administered 2013-01-18: 650 mg via ORAL
  Filled 2013-01-18: qty 2

## 2013-01-18 MED ORDER — IOHEXOL 300 MG/ML  SOLN
100.0000 mL | Freq: Once | INTRAMUSCULAR | Status: AC | PRN
  Administered 2013-01-18: 100 mL via INTRAVENOUS

## 2013-01-18 MED ORDER — SORBITOL 70 % SOLN
30.0000 mL | Freq: Every day | Status: DC | PRN
  Filled 2013-01-18: qty 30

## 2013-01-18 MED ORDER — POLYETHYLENE GLYCOL 3350 17 G PO PACK
17.0000 g | PACK | Freq: Every day | ORAL | Status: DC | PRN
  Filled 2013-01-18: qty 1

## 2013-01-18 MED ORDER — CITALOPRAM HYDROBROMIDE 40 MG PO TABS
40.0000 mg | ORAL_TABLET | Freq: Every day | ORAL | Status: DC
  Administered 2013-01-19 – 2013-01-21 (×3): 40 mg via ORAL
  Filled 2013-01-18 (×3): qty 1

## 2013-01-18 MED ORDER — SODIUM CHLORIDE 0.9 % IJ SOLN
3.0000 mL | Freq: Two times a day (BID) | INTRAMUSCULAR | Status: DC
  Administered 2013-01-20 – 2013-01-25 (×6): 3 mL via INTRAVENOUS

## 2013-01-18 MED ORDER — ACETAMINOPHEN 650 MG RE SUPP: 650.0000 mg | Freq: Four times a day (QID) | RECTAL | Status: DC | PRN

## 2013-01-18 MED ORDER — SODIUM CHLORIDE 0.9 % IV SOLN
INTRAVENOUS | Status: DC
  Administered 2013-01-18: 16:00:00 via INTRAVENOUS

## 2013-01-18 MED ORDER — ONDANSETRON HCL 4 MG PO TABS
4.0000 mg | ORAL_TABLET | Freq: Four times a day (QID) | ORAL | Status: DC | PRN
  Administered 2013-01-18 – 2013-01-22 (×2): 4 mg via ORAL
  Filled 2013-01-18 (×2): qty 1

## 2013-01-18 MED ORDER — MAGNESIUM CITRATE PO SOLN: 1.0000 | Freq: Once | ORAL | Status: AC | PRN

## 2013-01-18 NOTE — ED Notes (Signed)
Pt reports SHOB on exertion, generalized fatigue and weakness x 1 week.  She also reports bilateral ear "cloudy and pulsing".

## 2013-01-18 NOTE — ED Notes (Signed)
Report called to Tera, RN [facility] RN

## 2013-01-18 NOTE — Consult Note (Signed)
Referring Provider: No ref. provider found Primary Care Physician:  Danise Edge, MD Primary Gastroenterologist:  None, unassigned  Reason for Consultation:  Anemia, heme positive stools  HPI: Savannah Stanley is a 62 y.o. female with history of borderline diabetes, gastroesophageal reflux disease, prior history of anemia currently on iron supplements, hypertension, and mild COPD who presents to the ED with a ten-day history of progressive generalized fatigue and weakness. Patient also complaining of a 7-10 day history of blood and clots noted in her urine.  She was found to have a Hgb of 4.4 grams with normal MCV.  She was heme positive on DRE.  Was transferred to [facility] hospital for further evaluation.  EKG showed a sinus tachycardia.  Basic metabolic profile had a sodium of 133 potassium of 3.3, but otherwise was within normal limits.  Urinalysis which was done was red and cloudy with greater than 300 of protein, nitrite positive, moderate leukocytes, large hemoglobin, 21-50 WBCs, too numerous to count RBCs, and many bacteria.  Urology evaluation has been ordered as well as a CT scan with hematuria protocol.  She has been placed on Rocephin.  She is receiving 4 units of PRBC's.  Has been placed on protonix 40 mg daily, but was already on omeprazole twice daily at home.  She has never undergone colonoscopy or any GI evaluation.  Says that she knows a couple of people who had complications from colonoscopy.  Denies any GI complaints including nausea, vomiting, constipation, diarrhea, dark or bloody stools, abdominal pain, poor appetite or weight loss.  Says that her BM's are like clock-work every day.  She was told that she was anemic a couple of years ago by her PCP and was placed on iron, which she has been taking ever since that time.  Last Hgb 5 months ago was 13.5 grams.  Iron studies are pending.  Denies any regular NSAID use; uses very rarely.   Past Medical History  Diagnosis Date  . Esophageal  reflux 08/25/2012  . Anemia   . Diabetes mellitus without complication   . Hypertension   . COPD (chronic obstructive pulmonary disease)     Past Surgical History  Procedure Laterality Date  . Appendectomy    . Tubal ligation      Prior to Admission medications   Medication Sig Start Date End Date Taking? Authorizing Provider  citalopram (CELEXA) 40 MG tablet Take 40 mg by mouth daily.   Yes Historical Provider, MD  ferrous sulfate (FEOSOL) 325 (65 FE) MG tablet Take 325 mg by mouth daily with breakfast.     Yes Historical Provider, MD  Fluticasone-Salmeterol (ADVAIR) 250-50 MCG/DOSE AEPB Inhale 1 puff into the lungs every morning. 02/17/12  Yes Edwyna Perfect, MD  losartan (COZAAR) 50 MG tablet Take 1 tablet (50 mg total) by mouth daily. 01/05/13  Yes Bradd Canary, MD  metFORMIN (GLUCOPHAGE) 500 MG tablet Take 500 mg by mouth daily with breakfast.   Yes Historical Provider, MD  omeprazole (PRILOSEC) 20 MG capsule Take 1 capsule by mouth two times daily 01/05/13  Yes Bradd Canary, MD    No current facility-administered medications for this encounter.    Allergies as of 01/18/2013 - Review Complete 01/18/2013  Allergen Reaction Noted  . Bee venom Shortness Of Breath 11/09/2011  . Other Shortness Of Breath 11/09/2011    Family History  Problem Relation Age of Onset  . Cancer Father     prostate    History   Social History  .  Marital Status: Widowed    Spouse Name: N/A    Number of Children: N/A  . Years of Education: N/A   Occupational History  . Not on file.   Social History Main Topics  . Smoking status: Former Smoker -- 1.00 packs/day for 30 years    Types: Cigarettes  . Smokeless tobacco: Never Used  . Alcohol Use: No  . Drug Use: No  . Sexual Activity: Not on file   Other Topics Concern  . Not on file   Social History Narrative  . No narrative on file    Review of Systems: Ten point ROS is O/W negative except as mentioned in HPI.  Physical  Exam: Vital signs in last 24 hours: Temp:  [98.3 F (36.8 C)-98.6 F (37 C)] 98.6 F (37 C) (11/05 1302) Pulse Rate:  [90-115] 115 (11/05 1448) Resp:  [16] 16 (11/05 1448) BP: (103-124)/(52-90) 124/90 mmHg (11/05 1448) SpO2:  [95 %-100 %] 100 % (11/05 1448) Weight:  [190 lb (86.183 kg)] 190 lb (86.183 kg) (11/05 1009) Last BM Date: 01/18/13 General:   Alert, Well-developed, well-nourished, pleasant and cooperative in NAD Head:  Normocephalic and atraumatic. Eyes:  Sclera clear, no icterus.  Conjunctiva pale. Ears:  Normal auditory acuity. Mouth:  No deformity or lesions.   Lungs:  Clear throughout to auscultation.  No wheezes, crackles, or rhonchi.  Heart:  Tachy but regular rhythm; no murmurs, clicks, rubs, or gallops. Abdomen:  Soft, non-distended.  BS present.  Non-tender.   Rectal:  Deferred.  Was heme positive on DRE at [facility].  Msk:  Symmetrical without gross deformities. Pulses:  Normal pulses noted. Extremities:  Without clubbing or edema. Neurologic:  Alert and  oriented x4; grossly normal neurologically. Skin:  Intact without significant lesions or rashes. Psych:  Alert and cooperative. Normal mood and affect.  Lab Results:  Recent Labs  01/18/13 1045  WBC 7.6  HGB 4.4*  HCT 14.8*  PLT 285   BMET  Recent Labs  01/18/13 1045  NA 133*  K 3.3*  CL 97  CO2 25  GLUCOSE 167*  BUN 15  CREATININE 0.60  CALCIUM 8.3*   Studies/Results: Dg Chest 2 View  01/18/2013   CLINICAL DATA:  Fatigue  EXAM: CHEST  2 VIEW  COMPARISON:  Jul 28, 2010  FINDINGS: The lungs are clear. The heart size and pulmonary vascularity are normal. No adenopathy. There is a moderate-sized hiatal type hernia. There is degenerative change in the thoracic spine.  IMPRESSION: Hiatal hernia. No edema or consolidation.   Electronically Signed   By: Bretta Bang M.D.   On: 01/18/2013 11:06    IMPRESSION:  -Profound symptomatic anemia secondary to blood loss with Hgb of 4.4 grams, most likely  from her hematuria, however, was heme positive as well. -Gross hematuria x 10 days:  Urology has been consulted and CT scan with hematuria protocol has been ordered.  This is obviously the primary issue at present. -Heme positive stools on DRE x 1 but no overt GIB noted.   PLAN: -Agree with transfuse of 4 units PRBC's.  Monitor Hgb. -Follow-up urology evaluation. -Follow-up iron studies. -Suggest EGD and colonoscopy, however, timing will depend on urology course.    Colyn Miron D.  01/18/2013, 3:38 PM  Pager number [phone]

## 2013-01-18 NOTE — Consult Note (Addendum)
I agree with the above documentation, including the assessment and plan. Agree with GI evaluation after improvement in gross hematuria.  CT scan pending.

## 2013-01-18 NOTE — ED Provider Notes (Signed)
CSN: [id number]     Arrival date & time 01/18/13  4098 History   First MD Initiated Contact with Patient 01/18/13 1015     Chief Complaint  Patient presents with  . Shortness of Breath  . Fatigue  . Ear Problem   (Consider location/radiation/quality/duration/timing/severity/associated sxs/prior Treatment) Patient is a 62 y.o. female presenting with weakness. The history is provided by the patient.  Weakness This is a new problem. The current episode started more than 1 week ago. The problem occurs constantly. The problem has been gradually worsening. Pertinent negatives include no chest pain, no abdominal pain and no shortness of breath. Nothing aggravates the symptoms. Nothing relieves the symptoms. She has tried nothing for the symptoms.    Past Medical History  Diagnosis Date  . Esophageal reflux 08/25/2012  . Anemia   . Diabetes mellitus without complication   . Hypertension   . COPD (chronic obstructive pulmonary disease)    Past Surgical History  Procedure Laterality Date  . Appendectomy    . Tubal ligation     Family History  Problem Relation Age of Onset  . Cancer Father     prostate   History  Substance Use Topics  . Smoking status: Former Games developer  . Smokeless tobacco: Never Used  . Alcohol Use: No   OB History   Grav Para Term Preterm Abortions TAB SAB Ect Mult Living                 Review of Systems  Respiratory: Negative for shortness of breath.   Cardiovascular: Positive for leg swelling (chronic). Negative for chest pain.  Gastrointestinal: Negative for nausea, abdominal pain and anal bleeding.  Neurological: Positive for weakness.  All other systems reviewed and are negative.    Allergies  Bee venom and Other  Home Medications   Current Outpatient Rx  Name  Route  Sig  Dispense  Refill  . citalopram (CELEXA) 40 MG tablet      TAKE 1 TABLET BY MOUTH DAILY   90 tablet   1     CYCLE FILL MEDICATION. Authorization is required f ...   .  ferrous sulfate (FEOSOL) 325 (65 FE) MG tablet   Oral   Take 325 mg by mouth daily with breakfast.           . Fluticasone-Salmeterol (ADVAIR DISKUS) 250-50 MCG/DOSE AEPB   Inhalation   Inhale 1 puff into the lungs every 12 (twelve) hours.   180 each   3     Please dispense a 90 day supply.   Marland Kitchen losartan (COZAAR) 50 MG tablet      Take 1 tablet (50 mg total) by mouth daily.   90 tablet   4   . metFORMIN (GLUCOPHAGE) 500 MG tablet      TAKE 1 TABLET (500 MG TOTAL) BY MOUTH DAILY WITH BREAKFAST.   60 tablet   1     CYCLE FILL MEDICATION. Authorization is required f ...   . omeprazole (PRILOSEC) 20 MG capsule      Take 1 capsule by mouth two times daily   180 capsule   0    BP 111/56  Pulse 95  Temp(Src) 98.3 F (36.8 C) (Oral)  Resp 16  Ht 5\' 3"  (1.6 m)  Wt 190 lb (86.183 kg)  BMI 33.67 kg/m2  SpO2 97% Physical Exam  Nursing note and vitals reviewed. Constitutional: She is oriented to person, place, and time. She appears well-developed and well-nourished. No distress.  HENT:  Head: Normocephalic and atraumatic.  Eyes: EOM are normal. Pupils are equal, round, and reactive to light.  Conjunctival pallor  Neck: Normal range of motion. Neck supple.  Cardiovascular: Normal rate and regular rhythm.  Exam reveals no friction rub.   No murmur heard. Pulmonary/Chest: Effort normal and breath sounds normal. No respiratory distress. She has no wheezes. She has no rales.  Abdominal: Soft. She exhibits no distension. There is no tenderness. There is no rebound.  Musculoskeletal: Normal range of motion. She exhibits no edema.  Neurological: She is alert and oriented to person, place, and time.  Skin: She is not diaphoretic.    ED Course  Procedures (including critical care time) Labs Review Labs Reviewed  CBC - Abnormal; Notable for the following:    RBC 1.57 (*)    Hemoglobin 4.4 (*)    HCT 14.8 (*)    MCHC 29.7 (*)    All other components within normal limits   PRO B NATRIURETIC PEPTIDE - Abnormal; Notable for the following:    Pro B Natriuretic peptide (BNP) 160.9 (*)    All other components within normal limits  TROPONIN I  BASIC METABOLIC PANEL  URINALYSIS, ROUTINE W REFLEX MICROSCOPIC  CG4 I-STAT (LACTIC ACID)   Imaging Review Dg Chest 2 View  01/18/2013   CLINICAL DATA:  Fatigue  EXAM: CHEST  2 VIEW  COMPARISON:  Jul 28, 2010  FINDINGS: The lungs are clear. The heart size and pulmonary vascularity are normal. No adenopathy. There is a moderate-sized hiatal type hernia. There is degenerative change in the thoracic spine.  IMPRESSION: Hiatal hernia. No edema or consolidation.   Electronically Signed   By: Bretta Bang M.D.   On: 01/18/2013 11:06    EKG Interpretation   None      Date: 01/18/2013  Rate: 99  Rhythm: normal sinus rhythm and with PACs  QRS Axis: normal  Intervals: QT prolonged  ST/T Wave abnormalities: normal  Conduction Disutrbances:none  Narrative Interpretation:   Old EKG Reviewed: unchanged    MDM   1. Weakness   2. Anemia    63F with fatigue. Patient has worsening fatigue over past week and a half. Nothing like this prior. Denies chest pain, shortness of breath. Denies any rectal bleeding. She does have a history of anemia, but has never or blood transfusion. She feels like her ears are pulsing. She denies any dizziness. She doesn't really have shortness of breath on exertion, but she does get very fatigued. Here vitals show mild tachycardia at 105, blood pressure 116/52. She is afebrile. She's relaxing comfortable in the bed without any acute distress. She does have conjunctival pallor. Her lungs are clear her belly is nontender. Will pursue a broad-based workup including labs, EKG, urine, chest x-ray. Hgb found to be 4.4. Normal lactic acid. Hemoccult positive, however brown stool, no melena. Admitted to [facility].  Dagmar Hait, MD 01/18/13 [id number]

## 2013-01-18 NOTE — Consult Note (Signed)
Urology Consult   Physician requesting consult: Ramiro Harvest. MD  Reason for consult: Gross hematuria, anemia  History of Present Illness: Savannah Stanley is a 62 y.o. female who was just admitted for gross hematuria and severe anemia. The patient has been having intermittent dark urine for the past 2 months without others symptoms other than some urinary frequency. She began passing frank blood about 10 days ago, again without pain. Because she has been feeling weak and tired, she called her primary care physician today, who was not in the office. It was recommended that she go to the emergency room here at [facility] for further evaluation. She was found to have gross hematuria, evidence of a UTI despite not having any significant UTI symptoms. She had a hemoglobin of 4.4. She is admitted for further evaluation.  Sh e denies any bladder or abdominal pain. She denies frequent urinary tract infections. There is no family or personal history of urolithiasis. She usually has a good stream and feels like she empties well. She has passed some clots recently. She feels like she is emptying appropriately. She is not having fever or chills. She has not had to see a urologist before. She has not had a gynecologic exam in many years. She is a reformed smoker, having quit about 2 years ago.   Past Medical History  Diagnosis Date  . Esophageal reflux 08/25/2012  . Anemia   . Diabetes mellitus without complication   . Hypertension   . COPD (chronic obstructive pulmonary disease)     Past Surgical History  Procedure Laterality Date  . Appendectomy    . Tubal ligation       Current Hospital Medications: Scheduled Meds: . acetaminophen  650 mg Oral Once  . cefTRIAXone (ROCEPHIN)  IV  1 g Intravenous Q24H  . [START ON 01/19/2013] citalopram  40 mg Oral Daily  . diphenhydrAMINE  25 mg Oral Once  . furosemide  20 mg Intravenous Once  . insulin aspart  0-9 Units Subcutaneous TID WC   . [START ON 01/19/2013] losartan  50 mg Oral Daily  . mometasone-formoterol  2 puff Inhalation BID  . pantoprazole (PROTONIX) IV  40 mg Intravenous Q24H  . potassium chloride  40 mEq Oral Once  . sodium chloride  3 mL Intravenous Q12H   Continuous Infusions: . sodium chloride 75 mL/hr at 01/18/13 1623   PRN Meds:.acetaminophen, acetaminophen, albuterol, gi cocktail, ipratropium, magnesium citrate, ondansetron (ZOFRAN) IV, ondansetron, oxyCODONE, polyethylene glycol, sorbitol  Allergies:  Allergies  Allergen Reactions  . Bee Venom Shortness Of Breath    MSG.  . Other Shortness Of Breath    MSG.    Family History  Problem Relation Age of Onset  . Cancer Father     prostate    Social History:  reports that she quit smoking about 2 years ago. Her smoking use included Cigarettes. She has a 30 pack-year smoking history. She has never used smokeless tobacco. She reports that she does not drink alcohol or use illicit drugs.  ROS: A complete review of systems was performed.  All systems are negative except for pertinent findings as noted.  Physical Exam:  Vital signs in last 24 hours: Temp:  [98.3 F (36.8 C)-98.6 F (37 C)] 98.6 F (37 C) (11/05 1302) Pulse Rate:  [90-115] 115 (11/05 1448) Resp:  [16] 16 (11/05 1448) BP: (103-124)/(52-90) 124/90 mmHg (11/05 1448) SpO2:  [95 %-100 %] 100 % (11/05 1448) Weight:  [86.183 kg (190  lb)] 86.183 kg (190 lb) (11/05 1009) General:  Alert and oriented, No acute distress HEENT: Normocephalic, atraumatic Neck: No JVD or lymphadenopathy Cardiovascular: Regular rate and rhythm Lungs: Clear bilaterally Abdomen: Soft, nontender, nondistended, no abdominal masses. Abdomen is mildly protuberant. Bladder is nonpalpable. Back: No CVA tenderness Extremities: No edema Neurologic: Grossly intact External genitalia: Normal with the exception of mild vaginal atrophic changes Bimanual: Bladder nontender. No pelvic masses were present. Mild anterior  vaginal wall thickening but not out of the ordinary.  Laboratory Data:   Recent Labs  01/18/13 1045 01/18/13 1633  WBC 7.6 7.7  HGB 4.4* 4.5*  HCT 14.8* 14.4*  PLT 285 276     Recent Labs  01/18/13 1045  NA 133*  K 3.3*  CL 97  GLUCOSE 167*  BUN 15  CALCIUM 8.3*  CREATININE 0.60     Results for orders placed during the hospital encounter of 01/18/13 (from the past 24 hour(s))  CBC     Status: Abnormal   Collection Time    01/18/13 10:45 AM      Result Value Range   WBC 7.6  4.0 - 10.5 K/uL   RBC 1.57 (*) 3.87 - 5.11 MIL/uL   Hemoglobin 4.4 (*) 12.0 - 15.0 g/dL   HCT 57.8 (*) 46.9 - 62.9 %   MCV 94.3  78.0 - 100.0 fL   MCH 28.0  26.0 - 34.0 pg   MCHC 29.7 (*) 30.0 - 36.0 g/dL   RDW 52.8  41.3 - 24.4 %   Platelets 285  150 - 400 K/uL  BASIC METABOLIC PANEL     Status: Abnormal   Collection Time    01/18/13 10:45 AM      Result Value Range   Sodium 133 (*) 135 - 145 mEq/L   Potassium 3.3 (*) 3.5 - 5.1 mEq/L   Chloride 97  96 - 112 mEq/L   CO2 25  19 - 32 mEq/L   Glucose, Bld 167 (*) 70 - 99 mg/dL   BUN 15  6 - 23 mg/dL   Creatinine, Ser 0.10  0.50 - 1.10 mg/dL   Calcium 8.3 (*) 8.4 - 10.5 mg/dL   GFR calc non Af Amer >90  >90 mL/min   GFR calc Af Amer >90  >90 mL/min  TROPONIN I     Status: None   Collection Time    01/18/13 10:45 AM      Result Value Range   Troponin I <0.30  <0.30 ng/mL  PRO B NATRIURETIC PEPTIDE     Status: Abnormal   Collection Time    01/18/13 10:45 AM      Result Value Range   Pro B Natriuretic peptide (BNP) 160.9 (*) 0 - 125 pg/mL  CG4 I-STAT (LACTIC ACID)     Status: None   Collection Time    01/18/13 10:56 AM      Result Value Range   Lactic Acid, Venous 1.76  0.5 - 2.2 mmol/L  OCCULT BLOOD X 1 CARD TO LAB, STOOL     Status: Abnormal   Collection Time    01/18/13 11:35 AM      Result Value Range   Fecal Occult Bld POSITIVE (*) NEGATIVE  URINALYSIS, ROUTINE W REFLEX MICROSCOPIC     Status: Abnormal   Collection Time     01/18/13 12:19 PM      Result Value Range   Color, Urine RED (*) YELLOW   APPearance CLOUDY (*) CLEAR   Specific Gravity, Urine  1.014  1.005 - 1.030   pH 6.5  5.0 - 8.0   Glucose, UA NEGATIVE  NEGATIVE mg/dL   Hgb urine dipstick LARGE (*) NEGATIVE   Bilirubin Urine LARGE (*) NEGATIVE   Ketones, ur 15 (*) NEGATIVE mg/dL   Protein, ur >161 (*) NEGATIVE mg/dL   Urobilinogen, UA 1.0  0.0 - 1.0 mg/dL   Nitrite POSITIVE (*) NEGATIVE   Leukocytes, UA MODERATE (*) NEGATIVE  URINE MICROSCOPIC-ADD ON     Status: Abnormal   Collection Time    01/18/13 12:19 PM      Result Value Range   Squamous Epithelial / LPF RARE  RARE   WBC, UA 21-50  <3 WBC/hpf   RBC / HPF TOO NUMEROUS TO COUNT  <3 RBC/hpf   Bacteria, UA MANY (*) RARE   Urine-Other MUCOUS PRESENT    CBC WITH DIFFERENTIAL     Status: Abnormal   Collection Time    01/18/13  4:33 PM      Result Value Range   WBC 7.7  4.0 - 10.5 K/uL   RBC 1.60 (*) 3.87 - 5.11 MIL/uL   Hemoglobin 4.5 (*) 12.0 - 15.0 g/dL   HCT 09.6 (*) 04.5 - 40.9 %   MCV 90.0  78.0 - 100.0 fL   MCH 28.1  26.0 - 34.0 pg   MCHC 31.3  30.0 - 36.0 g/dL   RDW 81.1  91.4 - 78.2 %   Platelets 276  150 - 400 K/uL   Neutrophils Relative % 67  43 - 77 %   Neutro Abs 5.1  1.7 - 7.7 K/uL   Lymphocytes Relative 23  12 - 46 %   Lymphs Abs 1.8  0.7 - 4.0 K/uL   Monocytes Relative 10  3 - 12 %   Monocytes Absolute 0.7  0.1 - 1.0 K/uL   Eosinophils Relative 0  0 - 5 %   Eosinophils Absolute 0.0  0.0 - 0.7 K/uL   Basophils Relative 0  0 - 1 %   Basophils Absolute 0.0  0.0 - 0.1 K/uL  APTT     Status: None   Collection Time    01/18/13  4:33 PM      Result Value Range   aPTT 28  24 - 37 seconds  PROTIME-INR     Status: None   Collection Time    01/18/13  4:33 PM      Result Value Range   Prothrombin Time 13.4  11.6 - 15.2 seconds   INR 1.04  0.00 - 1.49   No results found for this or any previous visit (from the past 240 hour(s)).  Renal Function:  Recent Labs   01/18/13 1045  CREATININE 0.60   Estimated Creatinine Clearance: 73.9 ml/min (by C-G formula based on Cr of 0.6).  Radiologic Imaging: Dg Chest 2 View  01/18/2013   CLINICAL DATA:  Fatigue  EXAM: CHEST  2 VIEW  COMPARISON:  Jul 28, 2010  FINDINGS: The lungs are clear. The heart size and pulmonary vascularity are normal. No adenopathy. There is a moderate-sized hiatal type hernia. There is degenerative change in the thoracic spine.  IMPRESSION: Hiatal hernia. No edema or consolidation.   Electronically Signed   By: Bretta Bang M.D.   On: 01/18/2013 11:06    I independently reviewed the above imaging studies.  Impression/Assessment:  1. Gross hematuria. This has been going on, when questioned, probably about 2 months, although she is had frank blood only for about  10 days. This is painless in nature. She does have a history of tobacco use, but quit 2 years ago.  2. Pyuria. She may have a UTI. She is not symptomatic, however.  3. Severe anemia. This may be due to her hematuria, especially for this long term. However, it is unlikely, and other sources must be ruled out  Plan:  1. I agree with urine culture and antibiotics  2. I also agree with contrast and CT scan-this will help evaluate her upper tracts and intra-abdominal region  3. She will eventually need, unless she has total clearance of her hematuria with antibiotic management, a cystoscopy. That will be scheduled for later.  CC: Ramiro Harvest, M.D.

## 2013-01-18 NOTE — H&P (Signed)
[facility] History and Physical  Savannah Stanley ZOX:[id number] DOB: 02-20-1951 DOA: 01/18/2013  Referring physician: Dr. Gwendolyn Grant PCP: Danise Edge, MD  Specialists:   Chief Complaint: Fatigue/hematuria  HPI: Savannah Stanley is a 62 y.o. female  With history of borderline diabetes the patient, gastroesophageal reflux disease, prior history of anemia, hypertension, COPD who presents to the ED with a ten-day history of generalized fatigue and weakness. Patient also complaining of a one-week history of clots noted in her urine with no significant improvement. Patient does endorse feeling cold, dysuria, a bout of diarrhea one week prior to admission which has since resolved, polyuria. Patient denies any fevers, no chills, no nausea, no vomiting, no abdominal pain, no constipation, no melena, no hematemesis, no hematochezia, no cough. Patient does state occasionally may use Aleve or Advil however has not used these in a while. Patient denies any chronic alcohol abuse. Patient denies any history of peptic ulcer disease. Patient also complaining of fullness in the ears which has since resolved. Patient presented to the [facility] labs obtained showed a hemoglobin of 4.4 otherwise CBC was within normal limits with MCV of 94.3. First set of troponin was less than 0.30. EKG showed a sinus tachycardia. Pro BNP level was 160.9. Basic metabolic profile had a sodium of 133 potassium of 3.3 otherwise was within normal limits. A fecal occult blood test was done which was positive. Urinalysis which was done was read cloudy greater than 300 of protein nitrite positive moderate leukocytes large hemoglobin 21-50 WBCs too numerous to count RBCs many bacteria. We were called to admit the patient for further evaluation and management.  Review of Systems: The patient denies anorexia, fever, weight loss,, vision loss, decreased hearing, hoarseness, chest pain, syncope, dyspnea on exertion, peripheral edema,  balance deficits, hemoptysis, abdominal pain, melena, hematochezia, severe indigestion/heartburn, hematuria, incontinence, genital sores, muscle weakness, suspicious skin lesions, transient blindness, difficulty walking, depression, unusual weight change, abnormal bleeding, enlarged lymph nodes, angioedema, and breast masses.   Past Medical History  Diagnosis Date  . Esophageal reflux 08/25/2012  . Anemia   . Diabetes mellitus without complication   . Hypertension   . COPD (chronic obstructive pulmonary disease)    Past Surgical History  Procedure Laterality Date  . Appendectomy    . Tubal ligation     Social History:  reports that she quit smoking about 2 years ago. Her smoking use included Cigarettes. She has a 30 pack-year smoking history. She has never used smokeless tobacco. She reports that she does not drink alcohol or use illicit drugs. Widowed.  Allergies  Allergen Reactions  . Bee Venom Shortness Of Breath    MSG.  . Other Shortness Of Breath    MSG.    Family History  Problem Relation Age of Onset  . Cancer Father     prostate    Prior to Admission medications   Medication Sig Start Date End Date Taking? Authorizing Provider  citalopram (CELEXA) 40 MG tablet Take 40 mg by mouth daily.   Yes Historical Provider, MD  ferrous sulfate (FEOSOL) 325 (65 FE) MG tablet Take 325 mg by mouth daily with breakfast.     Yes Historical Provider, MD  Fluticasone-Salmeterol (ADVAIR) 250-50 MCG/DOSE AEPB Inhale 1 puff into the lungs every morning. 02/17/12  Yes Edwyna Perfect, MD  losartan (COZAAR) 50 MG tablet Take 1 tablet (50 mg total) by mouth daily. 01/05/13  Yes Bradd Canary, MD  metFORMIN (GLUCOPHAGE) 500 MG tablet  Take 500 mg by mouth daily with breakfast.   Yes Historical Provider, MD  omeprazole (PRILOSEC) 20 MG capsule Take 1 capsule by mouth two times daily 01/05/13  Yes Bradd Canary, MD   Physical Exam: Filed Vitals:   01/18/13 1448  BP: 124/90  Pulse: 115   Temp:   Resp: 16     General:  Pale well-developed well-nourished female in no acute cardiopulmonary distress.  Eyes: Pupils equal round reactive to light and accommodation. Extraocular movements intact. Conjunctival pallor.  ENT: Oropharynx is clear, no lesions, no exudates.  Neck: Supple no lymphadenopathy.  Cardiovascular: Regular rate rhythm no murmurs rubs or gallops. No JVD. No lower extremity edema.  Respiratory: Clear to auscultation bilaterally. No wheezes, no crackles, no rhonchi.  Abdomen: Soft, nontender, nondistended, positive bowel sounds. No rebound. No guarding.  Skin: Pale. No rashes or lesions noted.  Musculoskeletal: 5/5 bilateral upper extremity strength, 5/5 bilateral lower extremity strength.  Psychiatric: Normal mood. Normal affect. Fair insight. Fair judgment.  Neurologic: Alert and oriented x3. Cranial nerves II through XII are grossly intact. No focal deficits. Sensation is intact. Gait not tested secondary to safety.  Labs on Admission:  Basic Metabolic Panel:  Recent Labs Lab 01/18/13 1045  NA 133*  K 3.3*  CL 97  CO2 25  GLUCOSE 167*  BUN 15  CREATININE 0.60  CALCIUM 8.3*   Liver Function Tests: No results found for this basename: AST, ALT, ALKPHOS, BILITOT, PROT, ALBUMIN,  in the last 168 hours No results found for this basename: LIPASE, AMYLASE,  in the last 168 hours No results found for this basename: AMMONIA,  in the last 168 hours CBC:  Recent Labs Lab 01/18/13 1045  WBC 7.6  HGB 4.4*  HCT 14.8*  MCV 94.3  PLT 285   Cardiac Enzymes:  Recent Labs Lab 01/18/13 1045  TROPONINI <0.30    BNP (last 3 results)  Recent Labs  01/18/13 1045  PROBNP 160.9*   CBG: No results found for this basename: GLUCAP,  in the last 168 hours  Radiological Exams on Admission: Dg Chest 2 View  01/18/2013   CLINICAL DATA:  Fatigue  EXAM: CHEST  2 VIEW  COMPARISON:  Jul 28, 2010  FINDINGS: The lungs are clear. The heart size and  pulmonary vascularity are normal. No adenopathy. There is a moderate-sized hiatal type hernia. There is degenerative change in the thoracic spine.  IMPRESSION: Hiatal hernia. No edema or consolidation.   Electronically Signed   By: Bretta Bang M.D.   On: 01/18/2013 11:06    EKG: Independently reviewed. Sinus tachycardia  Assessment/Plan Principal Problem:   Anemia Active Problems:   COPD (chronic obstructive pulmonary disease)   Diabetes mellitus type 2 in obese   Depression   Hypertension   Hyperlipidemia   Esophageal reflux   Hematuria, gross   UTI (urinary tract infection)   Hypokalemia   Weakness   Tachycardia  #1 symptomatic anemia/acute blood loss anemia Likely multifactorial secondary to probable hematuria and possible GI bleed as patient's fecal local blood test was positive. Patient also noted to have gross hematuria over the past week with blood clots per patient. Urinalysis also consistent with hematuria. Will admit the patient to telemetry. Check an anemia panel. Type and cross. Patient's hemoglobin noted to be 4.4. Repeat a CBC, comprehensive metabolic profile, coags. Will place patient on clear liquids. PPI. Will check a CT of the abdomen and pelvis hematuria protocol. Transfuse 2 units packed red blood cells. Gentle  IV fluid hydration. Consult with gastroenterology and urology for further evaluation and management.  #2 hematuria Questionable etiology. Patient denies any CVA tenderness or back abdominal pain. Patient does have a 30-pack-year history of tobacco abuse. Concern for bladder malignancy versus kidney stones. We'll place a Foley catheter. Will check a CT of the abdomen and pelvis hematuria protocol. Place empirically on IV Rocephin secondary to urinary tract infection. Consult with urology for further evaluation and management.  #3 urinary tract infection Urine cultures are pending. Place on IV Rocephin.  #4 hypokalemia Check a magnesium level.  Replete.  #5 sinus tachycardia Likely secondary to symptomatic anemia. Patient denies any chest pain or shortness of breath. First set troponin was negative. Hydrate with IV fluids. Transfuse packed red blood cells. Follow.  #6 gastroesophageal reflux disease PPI.  #7 generalized weakness Likely secondary to problem #1.  #8 COPD Stable. Verbalizes as needed. Continue home regimen of Advair.  #9 borderline diabetes mellitus Hold metformin. Check a hemoglobin A1c. Place on a sliding scale insulin.  #10 depression Stable. Continue Celexa.  #11 prophylaxis PPI for GI prophylaxis. SCDs for DVT prophylaxis.   Code Status: Full Family Communication: Updated patient and friend at bedside. Disposition Plan: Admit to telemetry  Time spent: 70 mins  [facility] Pager [phone]  If 7PM-7AM, please contact night-coverage [email or web address] Password [facility] 01/18/2013, 3:53 PM

## 2013-01-18 NOTE — ED Notes (Signed)
Pt unable to void at this time. Pt knows we need a sample.

## 2013-01-19 ENCOUNTER — Other Ambulatory Visit: Payer: Self-pay | Admitting: Urology

## 2013-01-19 DIAGNOSIS — D494 Neoplasm of unspecified behavior of bladder: Secondary | ICD-10-CM

## 2013-01-19 DIAGNOSIS — C680 Malignant neoplasm of urethra: Secondary | ICD-10-CM | POA: Diagnosis present

## 2013-01-19 LAB — GLUCOSE, CAPILLARY: Glucose-Capillary: 163 mg/dL — ABNORMAL HIGH (ref 70–99)

## 2013-01-19 LAB — HEMOGLOBIN A1C
Hgb A1c MFr Bld: 5.9 % — ABNORMAL HIGH (ref <5.7)
Mean Plasma Glucose: 123 mg/dL — ABNORMAL HIGH (ref <117)

## 2013-01-19 LAB — CBC
HCT: 28.2 % — ABNORMAL LOW (ref 36.0–46.0)
Hemoglobin: 9.8 g/dL — ABNORMAL LOW (ref 12.0–15.0)
MCH: 30.7 pg (ref 26.0–34.0)
MCHC: 34.8 g/dL (ref 30.0–36.0)

## 2013-01-19 MED ORDER — PANTOPRAZOLE SODIUM 40 MG PO TBEC
40.0000 mg | DELAYED_RELEASE_TABLET | Freq: Every day | ORAL | Status: DC
  Administered 2013-01-19 – 2013-01-20 (×2): 40 mg via ORAL
  Filled 2013-01-19 (×2): qty 1

## 2013-01-19 NOTE — Progress Notes (Signed)
Patient ID: Savannah Stanley, female   DOB: [birth year], 62 y.o.   MRN: [id number] [facility] Progress Note  Subjective: Pt tearful, nervous about diagnosis, upcoming surgery etc. Still passing BR with urination Receiving 4 th unit of blood this am   Objective:  Vital signs in last 24 hours: Temp:  [98 F (36.7 C)-98.6 F (37 C)] 98.1 F (36.7 C) (11/06 1040) Pulse Rate:  [78-115] 90 (11/06 1040) Resp:  [16-18] 18 (11/06 1040) BP: (86-145)/(41-90) 123/61 mmHg (11/06 1040) SpO2:  [95 %-100 %] 99 % (11/06 1040) Weight:  [199 lb 4.7 oz (90.4 kg)] 199 lb 4.7 oz (90.4 kg) (11/06 0525) Last BM Date: 01/18/13 General:   Alert,  Well-developed,WF    in NAD Heart:  Regular rate and rhythm; no murmurs  Abdomen:  Soft, nontender and nondistended. Normal bowel sounds, without guarding, and without rebound.   Extremities:  Without edema. Neurologic:  Alert and  oriented x4;  grossly normal neurologically. Psych:  Alert and cooperative. Normal mood and affect.  Intake/Output from previous day: 11/05 0701 - 11/06 0700 In: 806.3 [Blood:806.3] Out: 1100 [Urine:1000] Intake/Output this shift: Total I/O In: 257.5 [P.O.:120; Blood:137.5] Out: -   Lab Results:  Recent Labs  01/18/13 1045 01/18/13 1633  WBC 7.6 7.7  HGB 4.4* 4.5*  HCT 14.8* 14.4*  PLT 285 276   BMET  Recent Labs  01/18/13 1045 01/18/13 1633  NA 133* 136  K 3.3* 3.5  CL 97 101  CO2 25 25  GLUCOSE 167* 133*  BUN 15 13  CREATININE 0.60 0.61  CALCIUM 8.3* 8.6   LFT  Recent Labs  01/18/13 1633  PROT 5.5*  ALBUMIN 3.0*  AST 12  ALT 7  ALKPHOS 38*  BILITOT 0.2*   PT/INR  Recent Labs  01/18/13 1633  LABPROT 13.4  INR 1.04    Assessment / Plan: #1  62 yo femael with marked normocytic anemia in setting of gross hematuria, and large bladder mass- suspect acute blood loss from bladder is source for anemia She has heme positive stool but no evidence for recent bleeding for bowel,and hgb  normal in 08/2012, arguing against chronic GI blood loss Plan is for bladder surgery next week, may need procedure  In interim to stop bleeding Pt needs colonoscopy  As she has never had one- discussed today with pt and family-this could be done inpatient or outpt. She does not want to proceed currently-would like to deal with bladder surgery first. Will need outpt follow up with Dr. Rhea Belton in 6-8 weeks to schedule colon/possible EGD Principal Problem:   Anemia Active Problems:   COPD (chronic obstructive pulmonary disease)   Diabetes mellitus type 2 in obese   Depression   Hypertension   Hyperlipidemia   Esophageal reflux   Hematuria, gross   UTI (urinary tract infection)   Hypokalemia   Weakness   Tachycardia     LOS: 1 day   Prentiss Hammett  01/19/2013, 11:36 AM

## 2013-01-19 NOTE — Progress Notes (Signed)
Subjective: Patient reports continued hematuria, but no bladder pain. She is emptying appropriately.  Objective: Vital signs in last 24 hours: Temp:  [98 F (36.7 C)-98.6 F (37 C)] 98.1 F (36.7 C) (11/06 1040) Pulse Rate:  [78-115] 90 (11/06 1040) Resp:  [16-18] 18 (11/06 1040) BP: (86-145)/(41-90) 123/61 mmHg (11/06 1040) SpO2:  [95 %-100 %] 99 % (11/06 1040) Weight:  [90.4 kg (199 lb 4.7 oz)] 90.4 kg (199 lb 4.7 oz) (11/06 0525)  Intake/Output from previous day: 11/05 0701 - 11/06 0700 In: 806.3 [Blood:806.3] Out: 1100 [Urine:1000] Intake/Output this shift: Total I/O In: 257.5 [P.O.:120; Blood:137.5] Out: -   Physical Exam:  Constitutional: Vital signs reviewed. WD WN in NAD   Eyes: PERRL, No scleral icterus.   Chest: Normal inspiratory and expiratory excursion   Lab Results:  Recent Labs  01/18/13 1045 01/18/13 1633  HGB 4.4* 4.5*  HCT 14.8* 14.4*   BMET  Recent Labs  01/18/13 1045 01/18/13 1633  NA 133* 136  K 3.3* 3.5  CL 97 101  CO2 25 25  GLUCOSE 167* 133*  BUN 15 13  CREATININE 0.60 0.61  CALCIUM 8.3* 8.6    Recent Labs  01/18/13 1633  INR 1.04   No results found for this basename: LABURIN,  in the last 72 hours Results for orders placed during the hospital encounter of 07/28/10  PATHOLOGIST SMEAR REVIEW     Status: None   Collection Time    07/31/10  5:31 AM      Result Value Range Status   Tech Review     Final   Value: Reviewed by Beulah Gandy. Luisa Hart, M.D. 07/31/10 NORMOCYTIC ANEMIA AND ATYPICAL MONONUCLEAR CELLS    Studies/Results: Ct Abdomen Pelvis W Wo Contrast  01/18/2013   CLINICAL DATA:  Patient with fatigue.  EXAM: CT ABDOMEN AND PELVIS WITHOUT AND WITH CONTRAST  TECHNIQUE: Multidetector CT imaging of the abdomen and pelvis was performed without contrast material in one or both body regions, followed by contrast material(s) and further sections in one or both body regions.  CONTRAST:  OMNIPAQUE IOHEXOL 300 MG/ML  SOLN   COMPARISON:  None.  FINDINGS: Limited visualization of the lower thorax demonstrates minimal atelectasis within the left lung base. Large hiatal hernia. Normal heart size. Low attenuation of the blood pool as can be seen with anemia.  The liver is normal in size and contour. There is a subcentimeter low-attenuation lesion within the left hepatic lobe (image 12; series 4), too small to accurately characterize. The spleen, pancreas and bilateral adrenal glands are unremarkable.  The kidneys enhance symmetrically with contrast. There is a 1.2 cm simple cyst within the superior pole of the left kidney.  Calcified atherosclerotic plaque involving a normal caliber abdominal aorta. Small fat containing bilateral inguinal hernias.  There is a large soft tissue mass originating from the posterior right bladder wall and growing within the bladder lumen measuring 6.2 x 5.2 cm.  Uterus and bilateral adnexa are grossly unremarkable.  Descending and sigmoid colonic diverticulosis. No evidence for acute diverticulitis. No abnormal bowel wall thickening. No evidence for bowel obstruction.  Lower lumbar spine degenerative change. No aggressive appearing osseous lesions.  IMPRESSION: 1. Large soft tissue bladder mass concerning for primary bladder malignancy such as transitional cell carcinoma. No significant pelvic lymphadenopathy is identified. No resultant hydronephrosis. 2. Large hiatal hernia. These results will be called to the ordering clinician or representative by the Radiologist Assistant, and communication documented in the PACS Dashboard.   Electronically  Signed   By: Annia Belt M.D.   On: 01/18/2013 18:24   Dg Chest 2 View  01/18/2013   CLINICAL DATA:  Fatigue  EXAM: CHEST  2 VIEW  COMPARISON:  Jul 28, 2010  FINDINGS: The lungs are clear. The heart size and pulmonary vascularity are normal. No adenopathy. There is a moderate-sized hiatal type hernia. There is degenerative change in the thoracic spine.  IMPRESSION:  Hiatal hernia. No edema or consolidation.   Electronically Signed   By: Bretta Bang M.D.   On: 01/18/2013 11:06    Assessment/Plan:   Hematuria with anemia-I am not sure whether all this anemia is due to her hematuria    Bladder tumor, 6 cm, on right-sided bladder. Certainly this is consistent with urothelial carcinoma the bladder    I agree with transfusion. Hopefully, hematuria will slow some. We will eventually get her on the schedule for a TURBT, that can be performed on an outpatient basis. I like to see her in the office before that. If necessary, because of significant hematuria, a catheter could be placed and a hemostatic agent such as alum irrigation could be used. I would like to hold off on that at the present time.   LOS: 1 day   [name] 01/19/2013, 11:20 AM

## 2013-01-19 NOTE — Progress Notes (Signed)
[facility] PROGRESS NOTE  Savannah Stanley NWG:[id number] DOB: 1950-09-13 DOA: 01/18/2013 PCP: Danise Edge, MD  Assessment/Plan: #1 gross hematuria Secondary to bladder tumor noted on CT of the abdomen and pelvis likely consistent with a urothelial carcinoma of the bladder. Patient still with hematuria. Urology following and appreciate input and recommendations.  #2 acute blood loss anemia/severe iron deficiency anemia Likely secondary to gross hematuria secondary to bladder tumor noted on CT of the abdomen and pelvis versus possible GI etiology as well. Anemia panel consistent with severe iron deficiency anemia. Hemoglobin on admission was 4.5. Patient is status post 4 units packed red blood cells. Repeat H&H pending. GI has assessed patient and patient will likely benefit from a colonoscopy however this be deferred to the outpatient setting. Urology following. Patient may benefit from IV iron during this hospitalization. Will likely need to be on iron supplementations on discharge.  #3 bladder tumor Noted per CT of the abdomen and pelvis. Per urology.  #4 urinary tract infection Urine cultures are pending. Continue IV Rocephin.  #5 hypokalemia Repleted. Magnesium level of 2.  #6 gastroesophageal reflux disease PPI. GI cocktail when necessary.  #7 sinus tachycardia Likely secondary to symptomatic anemia. Improved.  #8 COPD Stable. Nebs as needed. Continue Advair.  #9 depression Celexa.  #10 borderline diabetes mellitus Hemoglobin A1c is 5.9. Glucophage on hold. Continue sliding scale insulin.  #11 prophylaxis PPI for GI prophylaxis. SCDs for DVT prophylaxis.   Code Status: Full. Family Communication: Updated patient at bedside. Disposition Plan: Home when medically stable.   Consultants:  GI: Dr. Rhea Belton 01/18/2013  Urology: Dr. Retta Diones 01/18/2013  Procedures:  CT abdomen and pelvis 01/18/2013  4 units packed red blood cells 01/18/2013 to  01/19/2013  Chest x-ray 01/18/2013  Antibiotics:  IV Rocephin 01/18/2013  HPI/Subjective: Patient states still with hematuria. Feeling better.  Objective: Filed Vitals:   01/19/13 1340  BP: 109/54  Pulse: 90  Temp: 98.1 F (36.7 C)  Resp: 18    Intake/Output Summary (Last 24 hours) at 01/19/13 2016 Last data filed at 01/19/13 1900  Gross per 24 hour  Intake 2042.5 ml  Output   1450 ml  Net  592.5 ml   Filed Weights   01/18/13 1009 01/19/13 0525  Weight: 86.183 kg (190 lb) 90.4 kg (199 lb 4.7 oz)    Exam:   General:  NAD  Cardiovascular: RRR  Respiratory: CTAB  Abdomen: Soft/NT/ND/+BS  Musculoskeletal: 4/5 bue strength, 4/5 BLE  Data Reviewed: Basic Metabolic Panel:  Recent Labs Lab 01/18/13 1045 01/18/13 1633  NA 133* 136  K 3.3* 3.5  CL 97 101  CO2 25 25  GLUCOSE 167* 133*  BUN 15 13  CREATININE 0.60 0.61  CALCIUM 8.3* 8.6  MG  --  2.0   Liver Function Tests:  Recent Labs Lab 01/18/13 1633  AST 12  ALT 7  ALKPHOS 38*  BILITOT 0.2*  PROT 5.5*  ALBUMIN 3.0*   No results found for this basename: LIPASE, AMYLASE,  in the last 168 hours No results found for this basename: AMMONIA,  in the last 168 hours CBC:  Recent Labs Lab 01/18/13 1045 01/18/13 1633 01/19/13 1709  WBC 7.6 7.7 8.2  NEUTROABS  --  5.1  --   HGB 4.4* 4.5* 9.8*  HCT 14.8* 14.4* 28.2*  MCV 94.3 90.0 88.4  PLT 285 276 248   Cardiac Enzymes:  Recent Labs Lab 01/18/13 1045  TROPONINI <0.30   BNP (last 3 results)  Recent Labs  01/18/13 1045  PROBNP 160.9*   CBG:  Recent Labs Lab 01/18/13 1801 01/18/13 2057 01/19/13 0754 01/19/13 1147 01/19/13 1750  GLUCAP 153* 156* 139* 163* 136*    Recent Results (from the past 240 hour(s))  URINE CULTURE     Status: None   Collection Time    01/18/13 12:19 PM      Result Value Range Status   Specimen Description URINE, CLEAN CATCH   Final   Special Requests NONE   Final   Culture  Setup Time     Final    Value: 01/18/2013 14:50     Performed at [facility] Count     Final   Value: >=100,000 COLONIES/ML     Performed at [facility]   Culture     Final   Value: ESCHERICHIA COLI     Performed at [facility]   Report Status PENDING   Incomplete     Studies: Ct Abdomen Pelvis W Wo Contrast  01/18/2013   CLINICAL DATA:  Patient with fatigue.  EXAM: CT ABDOMEN AND PELVIS WITHOUT AND WITH CONTRAST  TECHNIQUE: Multidetector CT imaging of the abdomen and pelvis was performed without contrast material in one or both body regions, followed by contrast material(s) and further sections in one or both body regions.  CONTRAST:  OMNIPAQUE IOHEXOL 300 MG/ML  SOLN  COMPARISON:  None.  FINDINGS: Limited visualization of the lower thorax demonstrates minimal atelectasis within the left lung base. Large hiatal hernia. Normal heart size. Low attenuation of the blood pool as can be seen with anemia.  The liver is normal in size and contour. There is a subcentimeter low-attenuation lesion within the left hepatic lobe (image 12; series 4), too small to accurately characterize. The spleen, pancreas and bilateral adrenal glands are unremarkable.  The kidneys enhance symmetrically with contrast. There is a 1.2 cm simple cyst within the superior pole of the left kidney.  Calcified atherosclerotic plaque involving a normal caliber abdominal aorta. Small fat containing bilateral inguinal hernias.  There is a large soft tissue mass originating from the posterior right bladder wall and growing within the bladder lumen measuring 6.2 x 5.2 cm.  Uterus and bilateral adnexa are grossly unremarkable.  Descending and sigmoid colonic diverticulosis. No evidence for acute diverticulitis. No abnormal bowel wall thickening. No evidence for bowel obstruction.  Lower lumbar spine degenerative change. No aggressive appearing osseous lesions.  IMPRESSION: 1. Large soft tissue bladder mass concerning for  primary bladder malignancy such as transitional cell carcinoma. No significant pelvic lymphadenopathy is identified. No resultant hydronephrosis. 2. Large hiatal hernia. These results will be called to the ordering clinician or representative by the Radiologist Assistant, and communication documented in the PACS Dashboard.   Electronically Signed   By: Annia Belt M.D.   On: 01/18/2013 18:24   Dg Chest 2 View  01/18/2013   CLINICAL DATA:  Fatigue  EXAM: CHEST  2 VIEW  COMPARISON:  Jul 28, 2010  FINDINGS: The lungs are clear. The heart size and pulmonary vascularity are normal. No adenopathy. There is a moderate-sized hiatal type hernia. There is degenerative change in the thoracic spine.  IMPRESSION: Hiatal hernia. No edema or consolidation.   Electronically Signed   By: Bretta Bang M.D.   On: 01/18/2013 11:06    Scheduled Meds: . cefTRIAXone (ROCEPHIN)  IV  1 g Intravenous Q24H  . citalopram  40 mg Oral Daily  . insulin aspart  0-9 Units  Subcutaneous TID WC  . losartan  50 mg Oral Daily  . mometasone-formoterol  2 puff Inhalation BID  . pantoprazole  40 mg Oral Q0600  . sodium chloride  3 mL Intravenous Q12H   Continuous Infusions:   Principal Problem:   Anemia Active Problems:   COPD (chronic obstructive pulmonary disease)   Diabetes mellitus type 2 in obese   Depression   Hypertension   Hyperlipidemia   Esophageal reflux   Hematuria, gross   UTI (urinary tract infection)   Hypokalemia   Weakness   Tachycardia   Bladder tumor    Time spent: 30 mins    [facility]  [facility] Pager [phone]. If 7PM-7AM, please contact night-coverage at [email or web address], password [facility] 01/19/2013, 8:16 PM  LOS: 1 day

## 2013-01-19 NOTE — Progress Notes (Signed)
Patient seen, examined, and I agree with the above documentation, including the assessment and plan. She is overwhelmed at present with news of bladder tumor and upcoming surgery. We had long discussion about heme + stool and the importance of ruling out a GI source for her anemia.  Definitely her bladder tumor explains some of her anemia as well. Plan for now is to address bladder tumor per urology and then see me in clinic thereafter to discuss colonoscopy. We discussed colonoscopy today including the risks and benefits and while hesitant she reports she is willing to proceed.  We will rediscuss this in the office.  She knows friends in the past who experienced complications from colonoscopy. Will sign off for now, call with questions.

## 2013-01-20 ENCOUNTER — Other Ambulatory Visit: Payer: Self-pay | Admitting: Urology

## 2013-01-20 LAB — TYPE AND SCREEN
ABO/RH(D): A POS
Unit division: 0
Unit division: 0
Unit division: 0

## 2013-01-20 LAB — BASIC METABOLIC PANEL
BUN: 17 mg/dL (ref 6–23)
CO2: 26 mEq/L (ref 19–32)
Creatinine, Ser: 0.63 mg/dL (ref 0.50–1.10)
GFR calc Af Amer: >90 mL/min (ref >90)
GFR calc non Af Amer: >90 mL/min (ref >90)
Potassium: 3.9 mEq/L (ref 3.5–5.1)

## 2013-01-20 LAB — GLUCOSE, CAPILLARY
Glucose-Capillary: 133 mg/dL — ABNORMAL HIGH (ref 70–99)
Glucose-Capillary: 151 mg/dL — ABNORMAL HIGH (ref 70–99)

## 2013-01-20 LAB — CBC
HCT: 26.7 % — ABNORMAL LOW (ref 36.0–46.0)
MCHC: 34.5 g/dL (ref 30.0–36.0)
MCV: 89.6 fL (ref 78.0–100.0)
RDW: 13.7 % (ref 11.5–15.5)

## 2013-01-20 LAB — URINE CULTURE: Colony Count: >=100000

## 2013-01-20 MED ORDER — SODIUM CHLORIDE 0.9 % IV SOLN
250.0000 mg | INTRAVENOUS | Status: DC
  Administered 2013-01-20 – 2013-01-24 (×3): 250 mg via INTRAVENOUS
  Filled 2013-01-20 (×4): qty 20

## 2013-01-20 MED ORDER — PANTOPRAZOLE SODIUM 40 MG PO TBEC
40.0000 mg | DELAYED_RELEASE_TABLET | Freq: Two times a day (BID) | ORAL | Status: DC
  Administered 2013-01-20 – 2013-01-26 (×10): 40 mg via ORAL
  Filled 2013-01-20 (×14): qty 1

## 2013-01-20 NOTE — Progress Notes (Signed)
[facility] PROGRESS NOTE  Savannah Stanley:[id number] DOB: Nov 19, 1950 DOA: 01/18/2013 PCP: Danise Edge, MD  Assessment/Plan: #1 gross hematuria Secondary to bladder tumor noted on CT of the abdomen and pelvis likely consistent with a urothelial carcinoma of the bladder. Patient still with hematuria. Patient for TURBT on Monday per urology. Urology following and appreciate input and recommendations.  #2 acute blood loss anemia/severe iron deficiency anemia Likely secondary to gross hematuria secondary to bladder tumor noted on CT of the abdomen and pelvis versus possible GI etiology as well. Anemia panel consistent with severe iron deficiency anemia. Hemoglobin on admission was 4.5. Patient is status post 4 units packed red blood cells. Repeat H&H with hemoglobin of 9.2. GI has assessed patient and patient will likely benefit from a colonoscopy however this be deferred to the outpatient setting. Urology following. Patient may benefit from IV iron during this hospitalization. Will likely need to be on iron supplementations on discharge.  #3 bladder tumor Noted per CT of the abdomen and pelvis. Patient for TURBT on Monday per urology. Per urology.  #4 E. coli urinary tract infection Urine sensitivities are pending. Continue IV Rocephin.  #5 hypokalemia Repleted. Magnesium level of 2.  #6 gastroesophageal reflux disease PPI. GI cocktail when necessary.  #7 sinus tachycardia Likely secondary to symptomatic anemia. Improved.  #8 COPD Stable. Nebs as needed. Continue Advair.  #9 depression Celexa.  #10 borderline diabetes mellitus Hemoglobin A1c is 5.9. Glucophage on hold. Continue sliding scale insulin.  #11 prophylaxis PPI for GI prophylaxis. SCDs for DVT prophylaxis.   Code Status: Full. Family Communication: Updated patient at bedside. Disposition Plan: Home when medically stable.   Consultants:  GI: Dr. Rhea Belton 01/18/2013  Urology: Dr. Retta Diones  01/18/2013  Procedures:  CT abdomen and pelvis 01/18/2013  4 units packed red blood cells 01/18/2013 to 01/19/2013  Chest x-ray 01/18/2013  Antibiotics:  IV Rocephin 01/18/2013  HPI/Subjective: Patient states still with hematuria. Feeling better.  Objective: Filed Vitals:   01/20/13 1500  BP: 133/61  Pulse: 100  Temp: 97.9 F (36.6 C)  Resp: 18    Intake/Output Summary (Last 24 hours) at 01/20/13 1721 Last data filed at 01/20/13 1300  Gross per 24 hour  Intake    700 ml  Output   1350 ml  Net   -650 ml   Filed Weights   01/18/13 1009 01/19/13 0525 01/20/13 0540  Weight: 86.183 kg (190 lb) 90.4 kg (199 lb 4.7 oz) 91.717 kg (202 lb 3.2 oz)    Exam:   General:  NAD  Cardiovascular: RRR  Respiratory: CTAB  Abdomen: Soft/NT/ND/+BS  Musculoskeletal: 4/5 bue strength, 4/5 BLE  Data Reviewed: Basic Metabolic Panel:  Recent Labs Lab 01/18/13 1045 01/18/13 1633 01/20/13 0450  NA 133* 136 139  K 3.3* 3.5 3.9  CL 97 101 106  CO2 25 25 26   GLUCOSE 167* 133* 130*  BUN 15 13 17   CREATININE 0.60 0.61 0.63  CALCIUM 8.3* 8.6 8.5  MG  --  2.0  --    Liver Function Tests:  Recent Labs Lab 01/18/13 1633  AST 12  ALT 7  ALKPHOS 38*  BILITOT 0.2*  PROT 5.5*  ALBUMIN 3.0*   No results found for this basename: LIPASE, AMYLASE,  in the last 168 hours No results found for this basename: AMMONIA,  in the last 168 hours CBC:  Recent Labs Lab 01/18/13 1045 01/18/13 1633 01/19/13 1709 01/20/13 0450  WBC 7.6 7.7 8.2 5.7  NEUTROABS  --  5.1  --   --   HGB 4.4* 4.5* 9.8* 9.2*  HCT 14.8* 14.4* 28.2* 26.7*  MCV 94.3 90.0 88.4 89.6  PLT 285 276 248 227   Cardiac Enzymes:  Recent Labs Lab 01/18/13 1045  TROPONINI <0.30   BNP (last 3 results)  Recent Labs  01/18/13 1045  PROBNP 160.9*   CBG:  Recent Labs Lab 01/19/13 1750 01/19/13 2054 01/20/13 0805 01/20/13 1211 01/20/13 1621  GLUCAP 136* 125* 133* 144* 151*    Recent Results  (from the past 240 hour(s))  URINE CULTURE     Status: None   Collection Time    01/18/13 12:19 PM      Result Value Range Status   Specimen Description URINE, CLEAN CATCH   Final   Special Requests NONE   Final   Culture  Setup Time     Final   Value: 01/18/2013 14:50     Performed at [facility] Count     Final   Value: >=100,000 COLONIES/ML     Performed at [facility]   Culture     Final   Value: ESCHERICHIA COLI     Performed at [facility]   Report Status 01/20/2013 FINAL   Final   Organism ID, Bacteria ESCHERICHIA COLI   Final     Studies: Ct Abdomen Pelvis W Wo Contrast  01/18/2013   CLINICAL DATA:  Patient with fatigue.  EXAM: CT ABDOMEN AND PELVIS WITHOUT AND WITH CONTRAST  TECHNIQUE: Multidetector CT imaging of the abdomen and pelvis was performed without contrast material in one or both body regions, followed by contrast material(s) and further sections in one or both body regions.  CONTRAST:  OMNIPAQUE IOHEXOL 300 MG/ML  SOLN  COMPARISON:  None.  FINDINGS: Limited visualization of the lower thorax demonstrates minimal atelectasis within the left lung base. Large hiatal hernia. Normal heart size. Low attenuation of the blood pool as can be seen with anemia.  The liver is normal in size and contour. There is a subcentimeter low-attenuation lesion within the left hepatic lobe (image 12; series 4), too small to accurately characterize. The spleen, pancreas and bilateral adrenal glands are unremarkable.  The kidneys enhance symmetrically with contrast. There is a 1.2 cm simple cyst within the superior pole of the left kidney.  Calcified atherosclerotic plaque involving a normal caliber abdominal aorta. Small fat containing bilateral inguinal hernias.  There is a large soft tissue mass originating from the posterior right bladder wall and growing within the bladder lumen measuring 6.2 x 5.2 cm.  Uterus and bilateral adnexa are grossly  unremarkable.  Descending and sigmoid colonic diverticulosis. No evidence for acute diverticulitis. No abnormal bowel wall thickening. No evidence for bowel obstruction.  Lower lumbar spine degenerative change. No aggressive appearing osseous lesions.  IMPRESSION: 1. Large soft tissue bladder mass concerning for primary bladder malignancy such as transitional cell carcinoma. No significant pelvic lymphadenopathy is identified. No resultant hydronephrosis. 2. Large hiatal hernia. These results will be called to the ordering clinician or representative by the Radiologist Assistant, and communication documented in the PACS Dashboard.   Electronically Signed   By: Annia Belt M.D.   On: 01/18/2013 18:24    Scheduled Meds: . cefTRIAXone (ROCEPHIN)  IV  1 g Intravenous Q24H  . citalopram  40 mg Oral Daily  . insulin aspart  0-9 Units Subcutaneous TID WC  . losartan  50 mg Oral Daily  . mometasone-formoterol  2  puff Inhalation BID  . pantoprazole  40 mg Oral Q0600  . sodium chloride  3 mL Intravenous Q12H   Continuous Infusions:   Principal Problem:   Anemia Active Problems:   COPD (chronic obstructive pulmonary disease)   Diabetes mellitus type 2 in obese   Depression   Hypertension   Hyperlipidemia   Esophageal reflux   Hematuria, gross   UTI (urinary tract infection)   Hypokalemia   Weakness   Tachycardia   Bladder tumor    Time spent: 30 mins    [facility]  [facility] Pager [phone]. If 7PM-7AM, please contact night-coverage at [email or web address], password [facility] 01/20/2013, 5:21 PM  LOS: 2 days

## 2013-01-20 NOTE — Progress Notes (Signed)
Subjective: Patient reports that she is feeling fine. Still having hematuria.  Objective: Vital signs in last 24 hours: Temp:  [97.7 F (36.5 C)-98.4 F (36.9 C)] 97.7 F (36.5 C) (11/07 0540) Pulse Rate:  [74-90] 87 (11/07 0540) Resp:  [16-18] 16 (11/07 0540) BP: (105-148)/(53-77) 148/77 mmHg (11/07 0540) SpO2:  [94 %-100 %] 99 % (11/07 0540) Weight:  [91.717 kg (202 lb 3.2 oz)] 91.717 kg (202 lb 3.2 oz) (11/07 0540)  Intake/Output from previous day: 11/06 0701 - 11/07 0700 In: 1236.3 [P.O.:480; Blood:656.3; IV Piggyback:100] Out: 1050 [Urine:1050] Intake/Output this shift:    Physical Exam:  Constitutional: Vital signs reviewed. WD WN in NAD   Eyes: PERRL, No scleral icterus.     Lab Results:  Recent Labs  01/18/13 1633 01/19/13 1709 01/20/13 0450  HGB 4.5* 9.8* 9.2*  HCT 14.4* 28.2* 26.7*   BMET  Recent Labs  01/18/13 1633 01/20/13 0450  NA 136 139  K 3.5 3.9  CL 101 106  CO2 25 26  GLUCOSE 133* 130*  BUN 13 17  CREATININE 0.61 0.63  CALCIUM 8.6 8.5    Recent Labs  01/18/13 1633  INR 1.04   No results found for this basename: LABURIN,  in the last 72 hours Results for orders placed during the hospital encounter of 01/18/13  URINE CULTURE     Status: None   Collection Time    01/18/13 12:19 PM      Result Value Range Status   Specimen Description URINE, CLEAN CATCH   Final   Special Requests NONE   Final   Culture  Setup Time     Final   Value: 01/18/2013 14:50     Performed at [facility] Count     Final   Value: >=100,000 COLONIES/ML     Performed at [facility]   Culture     Final   Value: ESCHERICHIA COLI     Performed at [facility]   Report Status PENDING   Incomplete    Studies/Results: Ct Abdomen Pelvis W Wo Contrast  01/18/2013   CLINICAL DATA:  Patient with fatigue.  EXAM: CT ABDOMEN AND PELVIS WITHOUT AND WITH CONTRAST  TECHNIQUE: Multidetector CT imaging of the abdomen and pelvis was  performed without contrast material in one or both body regions, followed by contrast material(s) and further sections in one or both body regions.  CONTRAST:  OMNIPAQUE IOHEXOL 300 MG/ML  SOLN  COMPARISON:  None.  FINDINGS: Limited visualization of the lower thorax demonstrates minimal atelectasis within the left lung base. Large hiatal hernia. Normal heart size. Low attenuation of the blood pool as can be seen with anemia.  The liver is normal in size and contour. There is a subcentimeter low-attenuation lesion within the left hepatic lobe (image 12; series 4), too small to accurately characterize. The spleen, pancreas and bilateral adrenal glands are unremarkable.  The kidneys enhance symmetrically with contrast. There is a 1.2 cm simple cyst within the superior pole of the left kidney.  Calcified atherosclerotic plaque involving a normal caliber abdominal aorta. Small fat containing bilateral inguinal hernias.  There is a large soft tissue mass originating from the posterior right bladder wall and growing within the bladder lumen measuring 6.2 x 5.2 cm.  Uterus and bilateral adnexa are grossly unremarkable.  Descending and sigmoid colonic diverticulosis. No evidence for acute diverticulitis. No abnormal bowel wall thickening. No evidence for bowel obstruction.  Lower lumbar spine degenerative change.  No aggressive appearing osseous lesions.  IMPRESSION: 1. Large soft tissue bladder mass concerning for primary bladder malignancy such as transitional cell carcinoma. No significant pelvic lymphadenopathy is identified. No resultant hydronephrosis. 2. Large hiatal hernia. These results will be called to the ordering clinician or representative by the Radiologist Assistant, and communication documented in the PACS Dashboard.   Electronically Signed   By: Annia Belt M.D.   On: 01/18/2013 18:24   Dg Chest 2 View  01/18/2013   CLINICAL DATA:  Fatigue  EXAM: CHEST  2 VIEW  COMPARISON:  Jul 28, 2010  FINDINGS:  The lungs are clear. The heart size and pulmonary vascularity are normal. No adenopathy. There is a moderate-sized hiatal type hernia. There is degenerative change in the thoracic spine.  IMPRESSION: Hiatal hernia. No edema or consolidation.   Electronically Signed   By: Bretta Bang M.D.   On: 01/18/2013 11:06    Assessment/Plan:   1. Probable bladder cancer with gross hematuria. No evidence of extra vesicle spread    2. UTI, Escherichia coli, sensitivities pending    I have put her on the schedule for Monday for TURBT. I think it worthwhile to keep her in the hospital over the weekend. OK to switch to oral antibiotics once specific sensitivities have returned. I will put in orders for preoperative preparation. I discussed the procedure of TURBT as well as possible mitomycin placement with the patient and her daughter.   LOS: 2 days   [name] M 01/20/2013, 7:31 AM

## 2013-01-20 NOTE — Progress Notes (Signed)
Chart reviewed. Plan for TURBT on Monday, and patient to remain in hospital until surgery. If colonoscopy is desired before discharge, please contact GI service, otherwise patient to be seen in office followup to discuss/schedule outpatient colonoscopy.

## 2013-01-21 DIAGNOSIS — A498 Other bacterial infections of unspecified site: Secondary | ICD-10-CM

## 2013-01-21 LAB — CBC
HCT: 30 % — ABNORMAL LOW (ref 36.0–46.0)
Hemoglobin: 10 g/dL — ABNORMAL LOW (ref 12.0–15.0)
MCH: 30.1 pg (ref 26.0–34.0)
MCHC: 33.3 g/dL (ref 30.0–36.0)
Platelets: 281 10*3/uL (ref 150–400)
RBC: 3.32 MIL/uL — ABNORMAL LOW (ref 3.87–5.11)

## 2013-01-21 LAB — BASIC METABOLIC PANEL
BUN: 20 mg/dL (ref 6–23)
CO2: 24 mEq/L (ref 19–32)
Chloride: 101 mEq/L (ref 96–112)
GFR calc non Af Amer: >90 mL/min (ref >90)
Glucose, Bld: 152 mg/dL — ABNORMAL HIGH (ref 70–99)
Potassium: 3.7 mEq/L (ref 3.5–5.1)

## 2013-01-21 LAB — GLUCOSE, CAPILLARY
Glucose-Capillary: 180 mg/dL — ABNORMAL HIGH (ref 70–99)
Glucose-Capillary: 156 mg/dL — ABNORMAL HIGH (ref 70–99)
Glucose-Capillary: 89 mg/dL (ref 70–99)
Glucose-Capillary: 212 mg/dL — ABNORMAL HIGH (ref 70–99)

## 2013-01-21 MED ORDER — CITALOPRAM HYDROBROMIDE 20 MG PO TABS
20.0000 mg | ORAL_TABLET | Freq: Every day | ORAL | Status: DC
  Administered 2013-01-22 – 2013-01-26 (×5): 20 mg via ORAL
  Filled 2013-01-21 (×7): qty 1

## 2013-01-21 MED ORDER — CIPROFLOXACIN HCL 500 MG PO TABS
500.0000 mg | ORAL_TABLET | Freq: Two times a day (BID) | ORAL | Status: DC
  Administered 2013-01-21 – 2013-01-26 (×10): 500 mg via ORAL
  Filled 2013-01-21 (×12): qty 1

## 2013-01-21 NOTE — Progress Notes (Signed)
  Subjective: Patient reports that she is feeling fine. Still having hematuria.  Hgb stable.  Objective: Vital signs in last 24 hours: Temp:  [97.5 F (36.4 C)-98.5 F (36.9 C)] 97.5 F (36.4 C) (11/08 0440) Pulse Rate:  [78-100] 98 (11/08 0440) Resp:  [18-20] 20 (11/08 0440) BP: (104-133)/(58-68) 112/68 mmHg (11/08 0440) SpO2:  [94 %-100 %] 98 % (11/08 0851) Weight:  [92.579 kg (204 lb 1.6 oz)] 92.579 kg (204 lb 1.6 oz) (11/08 0440)  Intake/Output from previous day: 11/07 0701 - 11/08 0700 In: 480 [P.O.:360; IV Piggyback:120] Out: 1200 [Urine:1200] Intake/Output this shift:    Physical Exam:  Constitutional: Vital signs reviewed. WD WN in NAD   Eyes: PERRL, No scleral icterus.     Lab Results:  Recent Labs  01/19/13 1709 01/20/13 0450 01/21/13 0505  HGB 9.8* 9.2* 10.0*  HCT 28.2* 26.7* 30.0*   BMET  Recent Labs  01/20/13 0450 01/21/13 0505  NA 139 136  K 3.9 3.7  CL 106 101  CO2 26 24  GLUCOSE 130* 152*  BUN 17 20  CREATININE 0.63 0.68  CALCIUM 8.5 8.5    Recent Labs  01/18/13 1633  INR 1.04   No results found for this basename: LABURIN,  in the last 72 hours Results for orders placed during the hospital encounter of 01/18/13  URINE CULTURE     Status: None   Collection Time    01/18/13 12:19 PM      Result Value Range Status   Specimen Description URINE, CLEAN CATCH   Final   Special Requests NONE   Final   Culture  Setup Time     Final   Value: 01/18/2013 14:50     Performed at [facility] Count     Final   Value: >=100,000 COLONIES/ML     Performed at [facility]   Culture     Final   Value: ESCHERICHIA COLI     Performed at [facility]   Report Status 01/20/2013 FINAL   Final   Organism ID, Bacteria ESCHERICHIA COLI   Final    Studies/Results: No results found.  Assessment/Plan:   1. Probable bladder cancer with gross hematuria. No evidence of extra vesicle spread  Will hold off on foley  as long as she is not in clot retention.  hgb stable   2. UTI, Escherichia coli, now on oral cipro    I have put her on the schedule for Monday for TURBT. I think it worthwhile to keep her in the hospital over the weekend. OK to switch to oral antibiotics once specific sensitivities have returned. I will put in orders for preoperative preparation. I discussed the procedure of TURBT as well as possible mitomycin placement with the patient and her daughter.  CXR and ECG completed.   LOS: 3 days   Crist Fat 01/21/2013, 9:39 AM

## 2013-01-21 NOTE — Progress Notes (Signed)
[facility] PROGRESS NOTE  Savannah Stanley YNW:[id number] DOB: [birth year] DOA: 01/18/2013 PCP: Danise Edge, MD  Assessment/Plan: #1 gross hematuria Secondary to bladder tumor noted on CT of the abdomen and pelvis likely consistent with a urothelial carcinoma of the bladder. Patient still with hematuria. Patient for TURBT on Monday per urology. Urology following and appreciate input and recommendations.  #2 acute blood loss anemia/severe iron deficiency anemia Likely secondary to gross hematuria secondary to bladder tumor noted on CT of the abdomen and pelvis versus possible GI etiology as well. Anemia panel consistent with severe iron deficiency anemia. Hemoglobin on admission was 4.5. Patient is status post 4 units packed red blood cells. Repeat H&H with hemoglobin of 10 today. GI has assessed patient and patient will likely benefit from a colonoscopy however this be deferred to the outpatient setting. Urology following. Patient was started on IV iron yesterday. Will likely need to be on iron supplementations on discharge.  #3 bladder tumor Noted per CT of the abdomen and pelvis. Patient for TURBT on Monday per urology. Per urology.  #4 E. coli urinary tract infection Will change IV Rocephin to oral ciprofloxacin.   #5 hypokalemia Repleted. Magnesium level of 2.  #6 gastroesophageal reflux disease PPI BID. GI cocktail when necessary.  #7 sinus tachycardia Likely secondary to symptomatic anemia. Improved.  #8 COPD Stable. Nebs as needed. Continue Advair.  #9 depression Decrease dose of Celexa to 20 mg daily.  #10 borderline diabetes mellitus Hemoglobin A1c is 5.9. Glucophage on hold. Continue sliding scale insulin.  #11 prophylaxis PPI for GI prophylaxis. SCDs for DVT prophylaxis.   Code Status: Full. Family Communication: Updated patient at bedside. Disposition Plan: Home when medically stable.   Consultants:  GI: Dr. Rhea Belton 01/18/2013  Urology: Dr. Retta Diones  01/18/2013  Procedures:  CT abdomen and pelvis 01/18/2013  4 units packed red blood cells 01/18/2013 to 01/19/2013  Chest x-ray 01/18/2013  Antibiotics:  IV Rocephin 01/18/2013---> 01/21/2013  Oral ciprofloxacin 01/21/2013  HPI/Subjective: Patient states still with hematuria. Feeling better. No complaints.  Objective: Filed Vitals:   01/21/13 0440  BP: 112/68  Pulse: 98  Temp: 97.5 F (36.4 C)  Resp: 20    Intake/Output Summary (Last 24 hours) at 01/21/13 1113 Last data filed at 01/21/13 0442  Gross per 24 hour  Intake    360 ml  Output   1200 ml  Net   -840 ml   Filed Weights   01/19/13 0525 01/20/13 0540 01/21/13 0440  Weight: 90.4 kg (199 lb 4.7 oz) 91.717 kg (202 lb 3.2 oz) 92.579 kg (204 lb 1.6 oz)    Exam:   General:  NAD  Cardiovascular: RRR  Respiratory: CTAB  Abdomen: Soft/NT/ND/+BS  Musculoskeletal: 4/5 bue strength, 4/5 BLE  Data Reviewed: Basic Metabolic Panel:  Recent Labs Lab 01/18/13 1045 01/18/13 1633 01/20/13 0450 01/21/13 0505  NA 133* 136 139 136  K 3.3* 3.5 3.9 3.7  CL 97 101 106 101  CO2 25 25 26 24   GLUCOSE 167* 133* 130* 152*  BUN 15 13 17 20   CREATININE 0.60 0.61 0.63 0.68  CALCIUM 8.3* 8.6 8.5 8.5  MG  --  2.0  --   --    Liver Function Tests:  Recent Labs Lab 01/18/13 1633  AST 12  ALT 7  ALKPHOS 38*  BILITOT 0.2*  PROT 5.5*  ALBUMIN 3.0*   No results found for this basename: LIPASE, AMYLASE,  in the last 168 hours No results found for this basename: AMMONIA,  in the last 168 hours CBC:  Recent Labs Lab 01/18/13 1045 01/18/13 1633 01/19/13 1709 01/20/13 0450 01/21/13 0505  WBC 7.6 7.7 8.2 5.7 10.6*  NEUTROABS  --  5.1  --   --   --   HGB 4.4* 4.5* 9.8* 9.2* 10.0*  HCT 14.8* 14.4* 28.2* 26.7* 30.0*  MCV 94.3 90.0 88.4 89.6 90.4  PLT 285 276 248 227 281   Cardiac Enzymes:  Recent Labs Lab 01/18/13 1045  TROPONINI <0.30   BNP (last 3 results)  Recent Labs  01/18/13 1045  PROBNP  160.9*   CBG:  Recent Labs Lab 01/20/13 0805 01/20/13 1211 01/20/13 1621 01/20/13 2159 01/21/13 0736  GLUCAP 133* 144* 151* 115* 180*    Recent Results (from the past 240 hour(s))  URINE CULTURE     Status: None   Collection Time    01/18/13 12:19 PM      Result Value Range Status   Specimen Description URINE, CLEAN CATCH   Final   Special Requests NONE   Final   Culture  Setup Time     Final   Value: 01/18/2013 14:50     Performed at [facility] Count     Final   Value: >=100,000 COLONIES/ML     Performed at [facility]   Culture     Final   Value: ESCHERICHIA COLI     Performed at [facility]   Report Status 01/20/2013 FINAL   Final   Organism ID, Bacteria ESCHERICHIA COLI   Final     Studies: No results found.  Scheduled Meds: . ciprofloxacin  500 mg Oral BID  . citalopram  20 mg Oral Daily  . ferric gluconate (FERRLECIT/NULECIT) IV  250 mg Intravenous QODAY  . insulin aspart  0-9 Units Subcutaneous TID WC  . losartan  50 mg Oral Daily  . mometasone-formoterol  2 puff Inhalation BID  . pantoprazole  40 mg Oral BID WC  . sodium chloride  3 mL Intravenous Q12H   Continuous Infusions:   Principal Problem:   Anemia Active Problems:   COPD (chronic obstructive pulmonary disease)   Diabetes mellitus type 2 in obese   Depression   Hypertension   Hyperlipidemia   Esophageal reflux   Hematuria, gross   E. coli UTI   Hypokalemia   Weakness   Tachycardia   Bladder tumor    Time spent: 30 mins    [facility]  [facility] Pager [phone]. If 7PM-7AM, please contact night-coverage at [email or web address], password [facility] 01/21/2013, 11:13 AM  LOS: 3 days

## 2013-01-22 DIAGNOSIS — R000 Tachycardia, unspecified: Secondary | ICD-10-CM

## 2013-01-22 LAB — CBC
HCT: 26 % — ABNORMAL LOW (ref 36.0–46.0)
Hemoglobin: 8.4 g/dL — ABNORMAL LOW (ref 12.0–15.0)
Hemoglobin: 10.7 g/dL — ABNORMAL LOW (ref 12.0–15.0)
MCH: 30 pg (ref 26.0–34.0)
MCHC: 32.3 g/dL (ref 30.0–36.0)
MCHC: 32.2 g/dL (ref 30.0–36.0)
Platelets: 289 10*3/uL (ref 150–400)
RBC: 2.83 MIL/uL — ABNORMAL LOW (ref 3.87–5.11)
RDW: 14.1 % (ref 11.5–15.5)
WBC: 6.8 10*3/uL (ref 4.0–10.5)

## 2013-01-22 LAB — BASIC METABOLIC PANEL
Calcium: 8.9 mg/dL (ref 8.4–10.5)
Chloride: 102 mEq/L (ref 96–112)
GFR calc Af Amer: >90 mL/min (ref >90)
GFR calc non Af Amer: >90 mL/min (ref >90)
Potassium: 3.6 mEq/L (ref 3.5–5.1)
Sodium: 137 mEq/L (ref 135–145)

## 2013-01-22 LAB — GLUCOSE, CAPILLARY
Glucose-Capillary: 202 mg/dL — ABNORMAL HIGH (ref 70–99)
Glucose-Capillary: 81 mg/dL (ref 70–99)
Glucose-Capillary: 203 mg/dL — ABNORMAL HIGH (ref 70–99)

## 2013-01-22 MED ORDER — SODIUM CHLORIDE 0.9 % IV BOLUS (SEPSIS): 1000.0000 mL | Freq: Once | INTRAVENOUS | Status: DC

## 2013-01-22 MED ORDER — SODIUM CHLORIDE 0.9 % IV SOLN
INTRAVENOUS | Status: DC
  Administered 2013-01-22 – 2013-01-24 (×2): via INTRAVENOUS

## 2013-01-22 NOTE — Progress Notes (Signed)
At this time, writer was called to pt's room to find her vomiting into trash can. Pt states she just ate lunch and suddenly felt sweaty and nauseated. Pt just received her IV iron. States she also needs to sit on the toilet. Gave Zofran 4mg  IV as ordered PRN. Monitoring.

## 2013-01-22 NOTE — Progress Notes (Signed)
Pt now c/o left arm pain and dizziness. EKG done--results in chart.

## 2013-01-22 NOTE — Progress Notes (Signed)
[facility] PROGRESS NOTE  Savannah Stanley ZOX:[id number] DOB: [birth year] DOA: 01/18/2013 PCP: Danise Edge, MD  Assessment/Plan: #1 gross hematuria Secondary to bladder tumor noted on CT of the abdomen and pelvis likely consistent with a urothelial carcinoma of the bladder. Patient still with hematuria. Patient for TURBT on Monday per urology. Urology following and appreciate input and recommendations.  #2 acute blood loss anemia/severe iron deficiency anemia Likely secondary to gross hematuria secondary to bladder tumor noted on CT of the abdomen and pelvis versus possible GI etiology as well. Anemia panel consistent with severe iron deficiency anemia. Hemoglobin on admission was 4.5. Patient is status post 4 units packed red blood cells. Repeat H&H with hemoglobin of 8.4 today. GI has assessed patient and patient will likely benefit from a colonoscopy however this be deferred to the outpatient setting. Urology following. Patient was started on IV iron. Will likely need to be on iron supplementations on discharge.  #3 bladder tumor Noted per CT of the abdomen and pelvis. Patient for TURBT on Monday per urology. Per urology.  #4 E. coli urinary tract infection Continue oral ciprofloxacin.   #5 hypokalemia Repleted. Magnesium level of 2.  #6 gastroesophageal reflux disease PPI BID. GI cocktail when necessary.  #7 sinus tachycardia Likely secondary to symptomatic anemia. Improved.  #8 COPD Stable. Nebs as needed. Continue Advair.  #9 depression Decreased dose of Celexa to 20 mg daily.  #10 borderline diabetes mellitus Hemoglobin A1c is 5.9. Glucophage on hold. Continue sliding scale insulin.  #11 prophylaxis PPI for GI prophylaxis. SCDs for DVT prophylaxis.   Code Status: Full. Family Communication: Updated patient at bedside. Disposition Plan: Home when medically stable.   Consultants:  GI: Dr. Rhea Belton 01/18/2013  Urology: Dr. Retta Diones  01/18/2013  Procedures:  CT abdomen and pelvis 01/18/2013  4 units packed red blood cells 01/18/2013 to 01/19/2013  Chest x-ray 01/18/2013  Antibiotics:  IV Rocephin 01/18/2013---> 01/21/2013  Oral ciprofloxacin 01/21/2013  HPI/Subjective: Patient states still with hematuria. Patient with emesis today.  Objective: Filed Vitals:   01/22/13 1557  BP: 129/66  Pulse: 98  Temp:   Resp:     Intake/Output Summary (Last 24 hours) at 01/22/13 1605 Last data filed at 01/21/13 2104  Gross per 24 hour  Intake    363 ml  Output      0 ml  Net    363 ml   Filed Weights   01/20/13 0540 01/21/13 0440 01/22/13 0645  Weight: 91.717 kg (202 lb 3.2 oz) 92.579 kg (204 lb 1.6 oz) 93.2 kg (205 lb 7.5 oz)    Exam:   General:  NAD  Cardiovascular: RRR  Respiratory: CTAB  Abdomen: Soft/NT/ND/+BS  Musculoskeletal: 4/5 bue strength, 4/5 BLE  Data Reviewed: Basic Metabolic Panel:  Recent Labs Lab 01/18/13 1045 01/18/13 1633 01/20/13 0450 01/21/13 0505 01/22/13 0519  NA 133* 136 139 136 137  K 3.3* 3.5 3.9 3.7 3.6  CL 97 101 106 101 102  CO2 25 25 26 24 26   GLUCOSE 167* 133* 130* 152* 132*  BUN 15 13 17 20 22   CREATININE 0.60 0.61 0.63 0.68 0.64  CALCIUM 8.3* 8.6 8.5 8.5 8.9  MG  --  2.0  --   --   --    Liver Function Tests:  Recent Labs Lab 01/18/13 1633  AST 12  ALT 7  ALKPHOS 38*  BILITOT 0.2*  PROT 5.5*  ALBUMIN 3.0*   No results found for this basename: LIPASE, AMYLASE,  in the last  168 hours No results found for this basename: AMMONIA,  in the last 168 hours CBC:  Recent Labs Lab 01/18/13 1633 01/19/13 1709 01/20/13 0450 01/21/13 0505 01/22/13 0519  WBC 7.7 8.2 5.7 10.6* 6.8  NEUTROABS 5.1  --   --   --   --   HGB 4.5* 9.8* 9.2* 10.0* 8.4*  HCT 14.4* 28.2* 26.7* 30.0* 26.0*  MCV 90.0 88.4 89.6 90.4 91.9  PLT 276 248 227 281 289   Cardiac Enzymes:  Recent Labs Lab 01/18/13 1045  TROPONINI <0.30   BNP (last 3 results)  Recent  Labs  01/18/13 1045  PROBNP 160.9*   CBG:  Recent Labs Lab 01/21/13 1120 01/21/13 1701 01/21/13 2115 01/22/13 0737 01/22/13 1206  GLUCAP 156* 89 212* 202* 81    Recent Results (from the past 240 hour(s))  URINE CULTURE     Status: None   Collection Time    01/18/13 12:19 PM      Result Value Range Status   Specimen Description URINE, CLEAN CATCH   Final   Special Requests NONE   Final   Culture  Setup Time     Final   Value: 01/18/2013 14:50     Performed at [facility] Count     Final   Value: >=100,000 COLONIES/ML     Performed at [facility]   Culture     Final   Value: ESCHERICHIA COLI     Performed at [facility]   Report Status 01/20/2013 FINAL   Final   Organism ID, Bacteria ESCHERICHIA COLI   Final     Studies: No results found.  Scheduled Meds: . ciprofloxacin  500 mg Oral BID  . citalopram  20 mg Oral Daily  . ferric gluconate (FERRLECIT/NULECIT) IV  250 mg Intravenous QODAY  . insulin aspart  0-9 Units Subcutaneous TID WC  . losartan  50 mg Oral Daily  . mometasone-formoterol  2 puff Inhalation BID  . pantoprazole  40 mg Oral BID WC  . sodium chloride  3 mL Intravenous Q12H   Continuous Infusions: . sodium chloride      Principal Problem:   Anemia Active Problems:   COPD (chronic obstructive pulmonary disease)   Diabetes mellitus type 2 in obese   Depression   Hypertension   Hyperlipidemia   Esophageal reflux   Hematuria, gross   E. coli UTI   Hypokalemia   Weakness   Tachycardia   Bladder tumor    Time spent: 30 mins    [facility]  [facility] Pager [phone]. If 7PM-7AM, please contact night-coverage at [email or web address], password [email or web address] 01/22/2013, 4:05 PM  LOS: 4 days

## 2013-01-22 NOTE — Progress Notes (Signed)
  Subjective: Patient reports that she is feeling fine. Still having hematuria, but no complaints of pain.  Hgb slightly down this AM.  Objective: Vital signs in last 24 hours: Temp:  [97.6 F (36.4 C)-98.1 F (36.7 C)] 98.1 F (36.7 C) (11/09 0645) Pulse Rate:  [83-90] 83 (11/09 0645) Resp:  [20] 20 (11/09 0645) BP: (105-137)/(47-63) 137/63 mmHg (11/09 0645) SpO2:  [98 %-100 %] 99 % (11/09 0824) Weight:  [93.2 kg (205 lb 7.5 oz)] 93.2 kg (205 lb 7.5 oz) (11/09 0645)  Intake/Output from previous day: 11/08 0701 - 11/09 0700 In: 1083 [P.O.:1080; I.V.:3] Out: 800 [Urine:800] Intake/Output this shift:    Physical Exam:  Constitutional: Vital signs reviewed. WD WN in NAD   Eyes: PERRL, No scleral icterus.     Lab Results:  Recent Labs  01/20/13 0450 01/21/13 0505 01/22/13 0519  HGB 9.2* 10.0* 8.4*  HCT 26.7* 30.0* 26.0*   BMET  Recent Labs  01/21/13 0505 01/22/13 0519  NA 136 137  K 3.7 3.6  CL 101 102  CO2 24 26  GLUCOSE 152* 132*  BUN 20 22  CREATININE 0.68 0.64  CALCIUM 8.5 8.9   No results found for this basename: LABPT, INR,  in the last 72 hours No results found for this basename: LABURIN,  in the last 72 hours Results for orders placed during the hospital encounter of 01/18/13  URINE CULTURE     Status: None   Collection Time    01/18/13 12:19 PM      Result Value Range Status   Specimen Description URINE, CLEAN CATCH   Final   Special Requests NONE   Final   Culture  Setup Time     Final   Value: 01/18/2013 14:50     Performed at [facility] Count     Final   Value: >=100,000 COLONIES/ML     Performed at [facility]   Culture     Final   Value: ESCHERICHIA COLI     Performed at [facility]   Report Status 01/20/2013 FINAL   Final   Organism ID, Bacteria ESCHERICHIA COLI   Final    Studies/Results: No results found.  Assessment/Plan:  1. Probable bladder cancer with gross hematuria. No evidence of  extra vesicle spread  Will hold off on foley as long as she is not in clot retention.  hgb slightly lower today, will repeat tomorrow AM.  Transfuse < 7.0.  2. UTI, Escherichia coli, now on oral cipro  3. I have put her on the schedule for Monday for TURBT. I think it worthwhile to keep her in the hospital over the weekend.  I discussed the procedure of TURBT as well as possible mitomycin placement with the patient and her daughter.  CXR and ECG completed.  NPO p MN   LOS: 4 days   Crist Fat 01/22/2013, 9:17 AM

## 2013-01-22 NOTE — Progress Notes (Signed)
Pt states about 1340 that she now feels "OK, better". Lying in bed. Vomiting has stopped. Pt states she had 2 large soft bowel movements in the last 30 minutes. VS WNL. Sister of pt at bedside. Will monitor.

## 2013-01-22 NOTE — Progress Notes (Signed)
Pt sitting up in chair. Had nap. Feels "fine".  Monitoring continues.

## 2013-01-23 ENCOUNTER — Inpatient Hospital Stay (HOSPITAL_COMMUNITY): Payer: 59 | Admitting: Anesthesiology

## 2013-01-23 ENCOUNTER — Encounter (HOSPITAL_COMMUNITY)
Admission: EM | Disposition: A | Discharge status: Home / Self Care | Payer: Self-pay | Source: Home / Self Care | Attending: Internal Medicine

## 2013-01-23 ENCOUNTER — Encounter (HOSPITAL_COMMUNITY): Payer: Self-pay | Admitting: Anesthesiology

## 2013-01-23 ENCOUNTER — Encounter (HOSPITAL_COMMUNITY): Payer: 59 | Admitting: Anesthesiology

## 2013-01-23 HISTORY — PX: TRANSURETHRAL RESECTION OF BLADDER TUMOR WITH GYRUS (TURBT-GYRUS): SHX6458

## 2013-01-23 LAB — GLUCOSE, CAPILLARY
Glucose-Capillary: 99 mg/dL (ref 70–99)
Glucose-Capillary: 122 mg/dL — ABNORMAL HIGH (ref 70–99)
Glucose-Capillary: 156 mg/dL — ABNORMAL HIGH (ref 70–99)

## 2013-01-23 LAB — SURGICAL PCR SCREEN
MRSA, PCR: NEGATIVE
Staphylococcus aureus: NEGATIVE

## 2013-01-23 LAB — CBC
MCH: 30.7 pg (ref 26.0–34.0)
MCV: 92.6 fL (ref 78.0–100.0)
Platelets: 302 10*3/uL (ref 150–400)
RDW: 14.5 % (ref 11.5–15.5)
WBC: 10.8 10*3/uL — ABNORMAL HIGH (ref 4.0–10.5)

## 2013-01-23 LAB — BASIC METABOLIC PANEL
BUN: 20 mg/dL (ref 6–23)
Chloride: 103 mEq/L (ref 96–112)
Creatinine, Ser: 0.71 mg/dL (ref 0.50–1.10)
GFR calc Af Amer: >90 mL/min (ref >90)
GFR calc non Af Amer: 89 mL/min — ABNORMAL LOW (ref >90)
Potassium: 3.5 mEq/L (ref 3.5–5.1)
Sodium: 137 mEq/L (ref 135–145)

## 2013-01-23 LAB — HEMOGLOBIN AND HEMATOCRIT, BLOOD
HCT: 21.6 % — ABNORMAL LOW (ref 36.0–46.0)
Hemoglobin: 7.1 g/dL — ABNORMAL LOW (ref 12.0–15.0)

## 2013-01-23 LAB — PREPARE RBC (CROSSMATCH)

## 2013-01-23 SURGERY — TRANSURETHRAL RESECTION OF BLADDER TUMOR WITH GYRUS (TURBT-GYRUS)
Anesthesia: General | Wound class: Clean Contaminated

## 2013-01-23 MED ORDER — BELLADONNA ALKALOIDS-OPIUM 16.2-60 MG RE SUPP
RECTAL | Status: AC
  Filled 2013-01-23: qty 1

## 2013-01-23 MED ORDER — ZOLPIDEM TARTRATE 5 MG PO TABS: 5.0000 mg | ORAL_TABLET | Freq: Every evening | ORAL | Status: DC | PRN

## 2013-01-23 MED ORDER — PROPOFOL 10 MG/ML IV BOLUS
INTRAVENOUS | Status: DC | PRN
  Administered 2013-01-23: 150 mg via INTRAVENOUS

## 2013-01-23 MED ORDER — BELLADONNA ALKALOIDS-OPIUM 16.2-60 MG RE SUPP
1.0000 | Freq: Four times a day (QID) | RECTAL | Status: DC | PRN
  Administered 2013-01-24 – 2013-01-25 (×4): 1 via RECTAL
  Filled 2013-01-23 (×5): qty 1

## 2013-01-23 MED ORDER — ONDANSETRON HCL 4 MG/2ML IJ SOLN
INTRAMUSCULAR | Status: DC | PRN
  Administered 2013-01-23: 4 mg via INTRAVENOUS

## 2013-01-23 MED ORDER — ONDANSETRON HCL 4 MG/2ML IJ SOLN: 4.0000 mg | INTRAMUSCULAR | Status: DC | PRN

## 2013-01-23 MED ORDER — LIDOCAINE HCL 2 % EX GEL
CUTANEOUS | Status: AC
  Filled 2013-01-23: qty 10

## 2013-01-23 MED ORDER — DOCUSATE SODIUM 100 MG PO CAPS
100.0000 mg | ORAL_CAPSULE | Freq: Two times a day (BID) | ORAL | Status: DC
  Administered 2013-01-23 – 2013-01-26 (×6): 100 mg via ORAL
  Filled 2013-01-23 (×7): qty 1

## 2013-01-23 MED ORDER — PROMETHAZINE HCL 25 MG/ML IJ SOLN: 6.2500 mg | INTRAMUSCULAR | Status: DC | PRN

## 2013-01-23 MED ORDER — HYDROMORPHONE HCL PF 1 MG/ML IJ SOLN
INTRAMUSCULAR | Status: AC
  Filled 2013-01-23: qty 1

## 2013-01-23 MED ORDER — HYDROMORPHONE HCL PF 1 MG/ML IJ SOLN
0.5000 mg | INTRAMUSCULAR | Status: DC | PRN
  Administered 2013-01-24: 1 mg via INTRAVENOUS
  Filled 2013-01-23: qty 1

## 2013-01-23 MED ORDER — HYDROMORPHONE HCL PF 1 MG/ML IJ SOLN
0.2500 mg | INTRAMUSCULAR | Status: DC | PRN
  Administered 2013-01-23 (×2): 0.5 mg via INTRAVENOUS

## 2013-01-23 MED ORDER — SODIUM CHLORIDE 0.9 % IR SOLN
1000.0000 mL | Status: DC
  Administered 2013-01-23: 1000 mL

## 2013-01-23 MED ORDER — MITOMYCIN CHEMO FOR BLADDER INSTILLATION 40 MG: 40.0000 mg | Freq: Once | INTRAVENOUS | Status: AC

## 2013-01-23 MED ORDER — FENTANYL CITRATE 0.05 MG/ML IJ SOLN
INTRAMUSCULAR | Status: DC | PRN
  Administered 2013-01-23: 100 ug via INTRAVENOUS
  Administered 2013-01-23 (×10): 25 ug via INTRAVENOUS

## 2013-01-23 MED ORDER — SODIUM CHLORIDE 0.9 % IR SOLN
Status: DC | PRN
  Administered 2013-01-23: 50000 mL via INTRAVESICAL

## 2013-01-23 MED ORDER — LACTATED RINGERS IV SOLN
INTRAVENOUS | Status: DC | PRN
  Administered 2013-01-23 (×2): via INTRAVENOUS

## 2013-01-23 MED ORDER — POTASSIUM CHLORIDE CRYS ER 20 MEQ PO TBCR
40.0000 meq | EXTENDED_RELEASE_TABLET | Freq: Once | ORAL | Status: DC
  Filled 2013-01-23: qty 2

## 2013-01-23 SURGICAL SUPPLY — 22 items
BAG URINE DRAINAGE (UROLOGICAL SUPPLIES) IMPLANT
BAG URO CATCHER STRL LF (DRAPE) ×2 IMPLANT
CATH FOLEY 3WAY 30CC 24FR (CATHETERS) ×1
CATH URTH STD 24FR FL 3W 2 (CATHETERS) ×1 IMPLANT
CLOTH BEACON ORANGE TIMEOUT ST (SAFETY) ×2 IMPLANT
DRAPE CAMERA CLOSED 9X96 (DRAPES) ×2 IMPLANT
ELECT BUTTON HF 24-28F 2 30DE (ELECTRODE) ×2 IMPLANT
ELECT LOOP MED HF 24F 12D CBL (CLIP) ×2 IMPLANT
ELECT REM PT RETURN 9FT ADLT (ELECTROSURGICAL)
ELECTRODE REM PT RTRN 9FT ADLT (ELECTROSURGICAL) IMPLANT
EVACUATOR MICROVAS BLADDER (UROLOGICAL SUPPLIES) IMPLANT
GLOVE BIOGEL M 8.0 STRL (GLOVE) ×2 IMPLANT
GOWN PREVENTION PLUS XLARGE (GOWN DISPOSABLE) ×2 IMPLANT
GOWN STRL REIN XL XLG (GOWN DISPOSABLE) ×2 IMPLANT
KIT ASPIRATION TUBING (SET/KITS/TRAYS/PACK) ×2 IMPLANT
LOOPS RESECTOSCOPE DISP (ELECTROSURGICAL) IMPLANT
MANIFOLD NEPTUNE II (INSTRUMENTS) ×2 IMPLANT
PACK CYSTO (CUSTOM PROCEDURE TRAY) ×2 IMPLANT
SYR 30ML LL (SYRINGE) ×2 IMPLANT
SYRINGE IRR TOOMEY STRL 70CC (SYRINGE) ×2 IMPLANT
TUBING CONNECTING 10 (TUBING) ×2 IMPLANT
WATER STERILE IRR 3000ML UROMA (IV SOLUTION) IMPLANT

## 2013-01-23 NOTE — Transfer of Care (Signed)
Immediate Anesthesia Transfer of Care Note  Patient: Savannah Stanley  Procedure(s) Performed: Procedure(s): TRANSURETHRAL RESECTION OF BLADDER TUMOR WITH GYRUS  (N/A)  Patient Location: PACU  Anesthesia Type:General  Level of Consciousness: awake, alert , oriented and patient cooperative  Airway & Oxygen Therapy: Patient Spontanous Breathing and Patient connected to face mask oxygen  Post-op Assessment: Report given to PACU RN and Post -op Vital signs reviewed and stable  Post vital signs: Reviewed and stable  Complications: No apparent anesthesia complications

## 2013-01-23 NOTE — Progress Notes (Signed)
Post-op note  Subjective: The patient is doing well.  No complaints. Some burning in bladder  Objective: Vital signs in last 24 hours: Temp:  [97.4 F (36.3 C)-98.4 F (36.9 C)] 97.5 F (36.4 C) (11/10 1745) Pulse Rate:  [93-102] 99 (11/10 1745) Resp:  [14-20] 14 (11/10 1745) BP: (111-139)/(45-55) 132/55 mmHg (11/10 1745) SpO2:  [98 %-100 %] 100 % (11/10 1745) Weight:  [93 kg (205 lb 0.4 oz)] 93 kg (205 lb 0.4 oz) (11/10 0530)  Intake/Output from previous day: 11/09 0701 - 11/10 0700 In: 806.3 [I.V.:806.3] Out: -  Intake/Output this shift: Total I/O In: 5100 [I.V.:1700; Other:2400; IV Piggyback:1000] Out: 3100 [Urine:3100]  Physical Exam:  General: Alert and oriented. Abdomen: Soft, Nondistended. Incisions: Clean and dry.  Lab Results:  Recent Labs  01/22/13 1617 01/23/13 0514 01/23/13 1740  HGB 10.7* 8.6* 7.1*  HCT 33.2* 26.2* 21.6*    Assessment/Plan: POD#0   1) Continue to monitor 2) transfuse 2 u prbc--discussed w/ pt--hct 21%  Bertram Millard. Tori Dattilio, MD   LOS: 5 days   [name] 01/23/2013, 6:26 PM

## 2013-01-23 NOTE — Anesthesia Preprocedure Evaluation (Signed)
Anesthesia Evaluation  Patient identified by MRN, date of birth, ID band Patient awake    Reviewed: Allergy & Precautions, H&P , NPO status , Patient's Chart, lab work & pertinent test results  Airway Mallampati: II TM Distance: >3 FB Neck ROM: Full    Dental no notable dental hx.    Pulmonary COPDformer smoker,  breath sounds clear to auscultation  Pulmonary exam normal       Cardiovascular hypertension, Pt. on medications Rhythm:Regular Rate:Normal     Neuro/Psych PSYCHIATRIC DISORDERS Depression negative neurological ROS     GI/Hepatic Neg liver ROS, GERD-  Medicated,  Endo/Other  diabetes, Insulin Dependent  Renal/GU negative Renal ROS  negative genitourinary   Musculoskeletal negative musculoskeletal ROS (+)   Abdominal (+) + obese,   Peds negative pediatric ROS (+)  Hematology  (+) anemia ,   Anesthesia Other Findings   Reproductive/Obstetrics negative OB ROS                           Anesthesia Physical Anesthesia Plan  ASA: III  Anesthesia Plan: General   Post-op Pain Management:    Induction: Intravenous  Airway Management Planned: LMA  Additional Equipment:   Intra-op Plan:   Post-operative Plan: Extubation in OR  Informed Consent: I have reviewed the patients History and Physical, chart, labs and discussed the procedure including the risks, benefits and alternatives for the proposed anesthesia with the patient or authorized representative who has indicated his/her understanding and acceptance.   Dental advisory given  Plan Discussed with: CRNA  Anesthesia Plan Comments:         Anesthesia Quick Evaluation

## 2013-01-23 NOTE — Progress Notes (Signed)
[facility] PROGRESS NOTE  Savannah Stanley WUJ:[id number] DOB: [birth year] DOA: 01/18/2013 PCP: Danise Edge, MD  Assessment/Plan: #1 gross hematuria Secondary to bladder tumor noted on CT of the abdomen and pelvis likely consistent with a urothelial carcinoma of the bladder. Patient still with hematuria. Patient for TURBT today. Urology following and appreciate input and recommendations.  #2 acute blood loss anemia/severe iron deficiency anemia Likely secondary to gross hematuria secondary to bladder tumor noted on CT of the abdomen and pelvis versus possible GI etiology as well. Anemia panel consistent with severe iron deficiency anemia. Hemoglobin on admission was 4.5. Patient is status post 4 units packed red blood cells. Repeat H&H with hemoglobin of 8.6 today. GI has assessed patient and patient will likely benefit from a colonoscopy however this be deferred to the outpatient setting. Urology following. Patient was started on IV iron. Will likely need to be on iron supplementations on discharge.  #3 bladder tumor Noted per CT of the abdomen and pelvis. Patient for TURBT today per urology. Per urology.  #4 E. coli urinary tract infection Continue oral ciprofloxacin.   #5 hypokalemia Repleted. Magnesium level of 2.  #6 gastroesophageal reflux disease PPI BID. GI cocktail when necessary.  #7 sinus tachycardia Likely secondary to symptomatic anemia. Improved.  #8 COPD Stable. Nebs as needed. Continue Advair.  #9 depression Decreased dose of Celexa to 20 mg daily.  #10 borderline diabetes mellitus Hemoglobin A1c is 5.9. Glucophage on hold. Continue sliding scale insulin.  #11 prophylaxis PPI for GI prophylaxis. SCDs for DVT prophylaxis.   Code Status: Full. Family Communication: Updated patient, daughter, son, friend at bedside. Disposition Plan: Home when medically stable.   Consultants:  GI: Dr. Rhea Belton 01/18/2013  Urology: Dr. Retta Diones  01/18/2013  Procedures:  CT abdomen and pelvis 01/18/2013  4 units packed red blood cells 01/18/2013 to 01/19/2013  Chest x-ray 01/18/2013  Antibiotics:  IV Rocephin 01/18/2013---> 01/21/2013  Oral ciprofloxacin 01/21/2013  HPI/Subjective: Patient states still with hematuria. Patient denies emesis today. Awaiting surgery.  Objective: Filed Vitals:   01/23/13 0530  BP: 139/50  Pulse: 93  Temp: 97.5 F (36.4 C)  Resp: 20    Intake/Output Summary (Last 24 hours) at 01/23/13 1025 Last data filed at 01/23/13 0653  Gross per 24 hour  Intake 806.25 ml  Output      0 ml  Net 806.25 ml   Filed Weights   01/21/13 0440 01/22/13 0645 01/23/13 0530  Weight: 92.579 kg (204 lb 1.6 oz) 93.2 kg (205 lb 7.5 oz) 93 kg (205 lb 0.4 oz)    Exam:   General:  NAD  Cardiovascular: RRR  Respiratory: CTAB  Abdomen: Soft/NT/ND/+BS  Musculoskeletal: 4/5 bue strength, 4/5 BLE  Data Reviewed: Basic Metabolic Panel:  Recent Labs Lab 01/18/13 1633 01/20/13 0450 01/21/13 0505 01/22/13 0519 01/23/13 0514  NA 136 139 136 137 137  K 3.5 3.9 3.7 3.6 3.5  CL 101 106 101 102 103  CO2 25 26 24 26 23   GLUCOSE 133* 130* 152* 132* 134*  BUN 13 17 20 22 20   CREATININE 0.61 0.63 0.68 0.64 0.71  CALCIUM 8.6 8.5 8.5 8.9 8.6  MG 2.0  --   --   --   --    Liver Function Tests:  Recent Labs Lab 01/18/13 1633  AST 12  ALT 7  ALKPHOS 38*  BILITOT 0.2*  PROT 5.5*  ALBUMIN 3.0*   No results found for this basename: LIPASE, AMYLASE,  in the last 168 hours  No results found for this basename: AMMONIA,  in the last 168 hours CBC:  Recent Labs Lab 01/18/13 1633  01/20/13 0450 01/21/13 0505 01/22/13 0519 01/22/13 1617 01/23/13 0514  WBC 7.7  < > 5.7 10.6* 6.8 18.4* 10.8*  NEUTROABS 5.1  --   --   --   --   --   --   HGB 4.5*  < > 9.2* 10.0* 8.4* 10.7* 8.6*  HCT 14.4*  < > 26.7* 30.0* 26.0* 33.2* 26.2*  MCV 90.0  < > 89.6 90.4 91.9 93.0 92.6  PLT 276  < > 227 281 289 347 302   < > = values in this interval not displayed. Cardiac Enzymes:  Recent Labs Lab 01/18/13 1045  TROPONINI <0.30   BNP (last 3 results)  Recent Labs  01/18/13 1045  PROBNP 160.9*   CBG:  Recent Labs Lab 01/22/13 0737 01/22/13 1206 01/22/13 1724 01/22/13 2121 01/23/13 0743  GLUCAP 202* 81 203* 140* 113*    Recent Results (from the past 240 hour(s))  URINE CULTURE     Status: None   Collection Time    01/18/13 12:19 PM      Result Value Range Status   Specimen Description URINE, CLEAN CATCH   Final   Special Requests NONE   Final   Culture  Setup Time     Final   Value: 01/18/2013 14:50     Performed at [facility] Count     Final   Value: >=100,000 COLONIES/ML     Performed at [facility]   Culture     Final   Value: ESCHERICHIA COLI     Performed at [facility]   Report Status 01/20/2013 FINAL   Final   Organism ID, Bacteria ESCHERICHIA COLI   Final  SURGICAL PCR SCREEN     Status: None   Collection Time    01/23/13  7:45 AM      Result Value Range Status   MRSA, PCR NEGATIVE  NEGATIVE Final   Staphylococcus aureus NEGATIVE  NEGATIVE Final   Comment:            The Xpert SA Assay (FDA     approved for NASAL specimens     in patients over 8 years of age),     is one component of     a comprehensive surveillance     program.  Test performance has     been validated by [facility] for patients greater     than or equal to 8 year old.     It is not intended     to diagnose infection nor to     guide or monitor treatment.     Studies: No results found.  Scheduled Meds: . ciprofloxacin  500 mg Oral BID  . citalopram  20 mg Oral Daily  . ferric gluconate (FERRLECIT/NULECIT) IV  250 mg Intravenous QODAY  . insulin aspart  0-9 Units Subcutaneous TID WC  . losartan  50 mg Oral Daily  . mometasone-formoterol  2 puff Inhalation BID  . pantoprazole  40 mg Oral BID WC  . potassium chloride  40 mEq Oral Once  .  sodium chloride  3 mL Intravenous Q12H   Continuous Infusions: . sodium chloride 75 mL/hr at 01/22/13 2008    Principal Problem:   Hematuria, gross Active Problems:   COPD (chronic obstructive pulmonary disease)   Diabetes mellitus type 2  in obese   Anemia   Depression   Hypertension   Hyperlipidemia   Esophageal reflux   E. coli UTI   Hypokalemia   Weakness   Tachycardia   Bladder tumor    Time spent: 30 mins    [facility]  [facility] Pager [phone]. If 7PM-7AM, please contact night-coverage at [email or web address], password [facility] 01/23/2013, 10:25 AM  LOS: 5 days

## 2013-01-23 NOTE — Anesthesia Postprocedure Evaluation (Signed)
  Anesthesia Post-op Note  Patient: Savannah Stanley  Procedure(s) Performed: Procedure(s) (LRB): TRANSURETHRAL RESECTION OF BLADDER TUMOR WITH GYRUS  (N/A)  Patient Location: PACU  Anesthesia Type: General  Level of Consciousness: awake and alert   Airway and Oxygen Therapy: Patient Spontanous Breathing  Post-op Pain: mild  Post-op Assessment: Post-op Vital signs reviewed, Patient's Cardiovascular Status Stable, Respiratory Function Stable, Patent Airway and No signs of Nausea or vomiting  Last Vitals:  Filed Vitals:   01/23/13 1745  BP: 132/55  Pulse: 99  Temp: 36.4 C  Resp: 14    Post-op Vital Signs: stable   Complications: No apparent anesthesia complications

## 2013-01-23 NOTE — Progress Notes (Signed)
Day of Surgery Subjective: Patient reports no pain   Objective: Vital signs in last 24 hours: Temp:  [97.4 F (36.3 C)-98.1 F (36.7 C)] 97.5 F (36.4 C) (11/10 0530) Pulse Rate:  [93-113] 93 (11/10 0530) Resp:  [18-20] 20 (11/10 0530) BP: (91-139)/(45-66) 139/50 mmHg (11/10 0530) SpO2:  [98 %-100 %] 98 % (11/10 0754) Weight:  [93 kg (205 lb 0.4 oz)] 93 kg (205 lb 0.4 oz) (11/10 0530)  Intake/Output from previous day: 11/09 0701 - 11/10 0700 In: 806.3 [I.V.:806.3] Out: -  Intake/Output this shift:    Physical Exam:  Constitutional: Vital signs reviewed. WD WN in NAD   Eyes: PERRL, No scleral icterus.   Cardiovascular: RRR Pulmonary/Chest: Normal effort Abdominal: Soft. Non-tender, non-distended, bowel sounds are normal, no masses, organomegaly, or guarding present.  Genitourinary: Extremities: No cyanosis or edema   Lab Results:  Recent Labs  01/22/13 0519 01/22/13 1617 01/23/13 0514  HGB 8.4* 10.7* 8.6*  HCT 26.0* 33.2* 26.2*   BMET  Recent Labs  01/22/13 0519 01/23/13 0514  NA 137 137  K 3.6 3.5  CL 102 103  CO2 26 23  GLUCOSE 132* 134*  BUN 22 20  CREATININE 0.64 0.71  CALCIUM 8.9 8.6   No results found for this basename: LABPT, INR,  in the last 72 hours No results found for this basename: LABURIN,  in the last 72 hours Results for orders placed during the hospital encounter of 01/18/13  URINE CULTURE     Status: None   Collection Time    01/18/13 12:19 PM      Result Value Range Status   Specimen Description URINE, CLEAN CATCH   Final   Special Requests NONE   Final   Culture  Setup Time     Final   Value: 01/18/2013 14:50     Performed at [facility] Count     Final   Value: >=100,000 COLONIES/ML     Performed at [facility]   Culture     Final   Value: ESCHERICHIA COLI     Performed at [facility]   Report Status 01/20/2013 FINAL   Final   Organism ID, Bacteria ESCHERICHIA COLI   Final  SURGICAL  PCR SCREEN     Status: None   Collection Time    01/23/13  7:45 AM      Result Value Range Status   MRSA, PCR NEGATIVE  NEGATIVE Final   Staphylococcus aureus NEGATIVE  NEGATIVE Final   Comment:            The Xpert SA Assay (FDA     approved for NASAL specimens     in patients over 49 years of age),     is one component of     a comprehensive surveillance     program.  Test performance has     been validated by [facility] for patients greater     than or equal to 56 year old.     It is not intended     to diagnose infection nor to     guide or monitor treatment.    Studies/Results: No results found.  Assessment/Plan:   Hematuria secondary to bladder cancer, for TURBT today.   LOS: 5 days   [name] M 01/23/2013, 12:50 PM

## 2013-01-23 NOTE — Preoperative (Signed)
Beta Blockers   Reason not to administer Beta Blockers:Not Applicable 

## 2013-01-24 ENCOUNTER — Encounter (HOSPITAL_COMMUNITY): Payer: Self-pay | Admitting: Urology

## 2013-01-24 LAB — CBC
HCT: 26.3 % — ABNORMAL LOW (ref 36.0–46.0)
Hemoglobin: 8.9 g/dL — ABNORMAL LOW (ref 12.0–15.0)
Hemoglobin: 10.2 g/dL — ABNORMAL LOW (ref 12.0–15.0)
MCH: 30.7 pg (ref 26.0–34.0)
MCHC: 33.8 g/dL (ref 30.0–36.0)
MCV: 91.6 fL (ref 78.0–100.0)
Platelets: 217 10*3/uL (ref 150–400)
RBC: 3.32 MIL/uL — ABNORMAL LOW (ref 3.87–5.11)
RDW: 15.5 % (ref 11.5–15.5)
WBC: 9.5 10*3/uL (ref 4.0–10.5)
WBC: 18.3 10*3/uL — ABNORMAL HIGH (ref 4.0–10.5)

## 2013-01-24 LAB — BASIC METABOLIC PANEL
BUN: 13 mg/dL (ref 6–23)
Chloride: 101 mEq/L (ref 96–112)
Creatinine, Ser: 0.7 mg/dL (ref 0.50–1.10)
GFR calc Af Amer: >90 mL/min (ref >90)
Glucose, Bld: 114 mg/dL — ABNORMAL HIGH (ref 70–99)
Potassium: 3.4 mEq/L — ABNORMAL LOW (ref 3.5–5.1)
Sodium: 134 mEq/L — ABNORMAL LOW (ref 135–145)

## 2013-01-24 LAB — GLUCOSE, CAPILLARY
Glucose-Capillary: 166 mg/dL — ABNORMAL HIGH (ref 70–99)
Glucose-Capillary: 163 mg/dL — ABNORMAL HIGH (ref 70–99)
Glucose-Capillary: 131 mg/dL — ABNORMAL HIGH (ref 70–99)

## 2013-01-24 MED ORDER — POTASSIUM CHLORIDE CRYS ER 20 MEQ PO TBCR
40.0000 meq | EXTENDED_RELEASE_TABLET | Freq: Once | ORAL | Status: AC
  Administered 2013-01-24: 40 meq via ORAL
  Filled 2013-01-24: qty 2

## 2013-01-24 MED ORDER — PHENOL 1.4 % MT LIQD
1.0000 | OROMUCOSAL | Status: DC | PRN
  Administered 2013-01-24: 1 via OROMUCOSAL
  Filled 2013-01-24: qty 177

## 2013-01-24 MED ORDER — MENTHOL 3 MG MT LOZG
1.0000 | LOZENGE | OROMUCOSAL | Status: DC | PRN
  Filled 2013-01-24: qty 9

## 2013-01-24 NOTE — Progress Notes (Addendum)
Urology Progress Note, POD#1 TURBT  Intv/Subjective: The patient is doing well.  No complaints. Some pressure/burning in bladder. CBI slow gtt, urine pink tinged.  Objective: Vital signs in last 24 hours: Temp:  [97.5 F (36.4 C)-98.4 F (36.9 C)] 98.1 F (36.7 C) (11/11 0445) Pulse Rate:  [87-105] 90 (11/11 0445) Resp:  [12-16] 12 (11/11 0445) BP: (107-140)/(42-74) 128/69 mmHg (11/11 0445) SpO2:  [96 %-100 %] 99 % (11/11 0445)  Intake/Output from previous day: 11/10 0701 - 11/11 0700 In: 9165 [P.O.:240; I.V.:2200; Blood:25; IV Piggyback:1000] Out: 6725 [Urine:6725] Intake/Output this shift: Total I/O In: 3765 [P.O.:240; I.V.:500; Blood:25; Other:3000] Out: 3075 [Urine:3075]  Physical Exam:  General: Alert and oriented. Abdomen: Soft, Nondistended.  Urine pink tinged on slow CBI gtt Incisions: Clean and dry.  Lab Results:  Recent Labs  01/22/13 1617 01/23/13 0514 01/23/13 1740  HGB 10.7* 8.6* 7.1*  HCT 33.2* 26.2* 21.6*    Assessment/Plan: POD#1, status post TURBT, stable.   1) wean CBI as able 2) followup post transfusion hemoglobin. 3) Dr. Retta Diones will be back tomorrow - will likely be discharged with foley catheter.     LOS: 6 days   Crist Fat 01/24/2013, 5:41 AM   Addendum:  Recent Labs  01/22/13 1617 01/23/13 0514 01/23/13 1740 01/24/13 0637  WBC 18.4* 10.8*  --  9.5  HGB 10.7* 8.6* 7.1* 8.9*  HCT 33.2* 26.2* 21.6* 26.3*    Recent Labs  01/22/13 0519 01/23/13 0514 01/24/13 0637  NA 137 137 134*  K 3.6 3.5 3.4*  CL 102 103 101  CO2 26 23 24   GLUCOSE 132* 134* 114*  BUN 22 20 13   CREATININE 0.64 0.71 0.70  CALCIUM 8.9 8.6 8.1*

## 2013-01-24 NOTE — Op Note (Signed)
Preoperative diagnosis:urothelial carcinoma of the bladder, replacing much of the bladder surface area  Postoperative diagnosis : Same  Procedure:TURBT large bladder tumor    Surgeon: Bertram Millard. Caran Storck, M.D.   Anesthesia: Gen.   Complications:*Ptrobable bladder perforation  Specimen(s):Bladder tumor  Drain(s):24 Fr foley to CBI  Indications:62 year old female with a large bladder tumor found radiographically upon evaluation for hematuria and anemia.  She presents at this time for resection.  She is aware of risks and complications of the procedure which have been discussed with both she and her children.  She is also aware of possibility of intravesical mitomycin placement following the procedure.   Technique and findings:the patient was properly identified in the holding area.  She had been on Cipro preoperatively, that was continued.  She was then taken to the operating room where general anesthetic was administered with the LMA.  She was placed in the dorsal lithotomy position.  Genitalia and perineum were prepped and draped, time out was performed.  A 22 French panendoscope was placed in her urethra.  Bladder was inspected.  The entire right sidewall and most of the posterior bladder were obscured by papillary tumor.  The left ureteral orifice was identified, the right could not.  There was not as much tumor on the left bladder wall.  I irrigated clots out with the resectoscope sheath which was then placed.  I then placed the cutting loop and the resectoscope element.  The bladder tumor was then resected, starting with the posterior lateral tumor, starting superiorly and then working inferiorly in the bladder.  A small satellite tumor to the left of this was also resected-that was only 1 cm in size.  I resected the majority of the left posterior wall tumor.  I then moved to the left sidewall tumor.  This was quite large as well, each tumor being well over 4-5 cm in size and having a  fairly broad base.  It was a trigonal lesion that was also resected down to what I thought was muscular layer.  There had been a small perforation posteriorly in the midline which was identified and carefully coagulated.  I spent approximately 2 hours resecting the tumor, which was totally resected due to its vast size.  It was my feeling at this point, after 2 hours of resection, that it would be judicious to either proceed with a second stage procedure in a few weeks, or, if there is high-grade invasive disease, to then proceed at a later date with cystectomy.  I placed the gyrus button, and coagulated the base of the resected tumor sites.  There was adequate hemostasis at this point.  I then placed a 24 French three-way Foley catheter and hooked up to CBI.  The bladder tumor fragments had been irrigated from the bladder at this point.  CBI was clear running following this.  She was then awakened and taken to the PACU in stable condition.

## 2013-01-24 NOTE — Progress Notes (Signed)
[facility] PROGRESS NOTE  Savannah Stanley ZOX:[id number] DOB: 14-Jun-1950 DOA: 01/18/2013 PCP: Danise Edge, MD  Assessment/Plan: #1 gross hematuria Secondary to bladder tumor noted on CT of the abdomen and pelvis likely consistent with a urothelial carcinoma of the bladder. Patient still with hematuria. Patient status post TURBT. Yesterday. Urology following and appreciate input and recommendations.  #2 acute blood loss anemia/severe iron deficiency anemia Likely secondary to gross hematuria secondary to bladder tumor noted on CT of the abdomen and pelvis versus possible GI etiology as well. Anemia panel consistent with severe iron deficiency anemia. Hemoglobin on admission was 4.5. Patient is status post 6 units packed red blood cells. Repeat H&H with hemoglobin of 8.9 today. GI has assessed patient and patient will likely benefit from a colonoscopy however this be deferred to the outpatient setting. Urology following. Patient was started on IV iron. Will likely need to be on iron supplementations on discharge.  #3 bladder tumor Noted per CT of the abdomen and pelvis. Patient status post TURBT yesterday per urology. Per urology.  #4 E. coli urinary tract infection Continue oral ciprofloxacin.   #5 hypokalemia Replete. Magnesium level of 2.  #6 gastroesophageal reflux disease PPI BID. GI cocktail when necessary.  #7 sinus tachycardia Likely secondary to symptomatic anemia. Improved.  #8 COPD Stable. Nebs as needed. Continue Advair.  #9 depression Decreased dose of Celexa to 20 mg daily.  #10 borderline diabetes mellitus Hemoglobin A1c is 5.9. Glucophage on hold. Continue sliding scale insulin.  #11 prophylaxis PPI for GI prophylaxis. SCDs for DVT prophylaxis.   Code Status: Full. Family Communication: Updated patient, brother, friend at bedside. Disposition Plan: Home when medically stable.   Consultants:  GI: Dr. Rhea Belton 01/18/2013  Urology: Dr. Retta Diones  01/18/2013  Procedures:  CT abdomen and pelvis 01/18/2013  4 units packed red blood cells 01/18/2013 to 01/19/2013, 2 units packed red blood cells 01/23/2013  Chest x-ray 01/18/2013  Status post TURBT 01/23/2013 by Dr. Retta Diones  Antibiotics:  IV Rocephin 01/18/2013---> 01/21/2013  Oral ciprofloxacin 01/21/2013  HPI/Subjective: Patient states still with hematuria. Patient denies emesis today. Patient complaining of some pressure in the bladder.  Objective: Filed Vitals:   01/24/13 1312  BP: 110/70  Pulse:   Temp:   Resp:     Intake/Output Summary (Last 24 hours) at 01/24/13 1411 Last data filed at 01/24/13 1400  Gross per 24 hour  Intake  81191 ml  Output   9775 ml  Net   2630 ml   Filed Weights   01/22/13 0645 01/23/13 0530 01/24/13 0739  Weight: 93.2 kg (205 lb 7.5 oz) 93 kg (205 lb 0.4 oz) 95.7 kg (210 lb 15.7 oz)    Exam:   General:  NAD  Cardiovascular: RRR  Respiratory: CTAB  Abdomen: Soft/NT/ND/+BS  Musculoskeletal: 4/5 bue strength, 4/5 BLE  Data Reviewed: Basic Metabolic Panel:  Recent Labs Lab 01/18/13 1633 01/20/13 0450 01/21/13 0505 01/22/13 0519 01/23/13 0514 01/24/13 0637  NA 136 139 136 137 137 134*  K 3.5 3.9 3.7 3.6 3.5 3.4*  CL 101 106 101 102 103 101  CO2 25 26 24 26 23 24   GLUCOSE 133* 130* 152* 132* 134* 114*  BUN 13 17 20 22 20 13   CREATININE 0.61 0.63 0.68 0.64 0.71 0.70  CALCIUM 8.6 8.5 8.5 8.9 8.6 8.1*  MG 2.0  --   --   --   --   --    Liver Function Tests:  Recent Labs Lab 01/18/13 1633  AST  12  ALT 7  ALKPHOS 38*  BILITOT 0.2*  PROT 5.5*  ALBUMIN 3.0*   No results found for this basename: LIPASE, AMYLASE,  in the last 168 hours No results found for this basename: AMMONIA,  in the last 168 hours CBC:  Recent Labs Lab 01/18/13 1633  01/21/13 0505 01/22/13 0519 01/22/13 1617 01/23/13 0514 01/23/13 1740 01/24/13 0637  WBC 7.7  < > 10.6* 6.8 18.4* 10.8*  --  9.5  NEUTROABS 5.1  --   --   --    --   --   --   --   HGB 4.5*  < > 10.0* 8.4* 10.7* 8.6* 7.1* 8.9*  HCT 14.4*  < > 30.0* 26.0* 33.2* 26.2* 21.6* 26.3*  MCV 90.0  < > 90.4 91.9 93.0 92.6  --  91.6  PLT 276  < > 281 289 347 302  --  217  < > = values in this interval not displayed. Cardiac Enzymes:  Recent Labs Lab 01/18/13 1045  TROPONINI <0.30   BNP (last 3 results)  Recent Labs  01/18/13 1045  PROBNP 160.9*   CBG:  Recent Labs Lab 01/23/13 1157 01/23/13 1650 01/23/13 2206 01/24/13 0738 01/24/13 1214  GLUCAP 99 122* 156* 166* 160*    Recent Results (from the past 240 hour(s))  URINE CULTURE     Status: None   Collection Time    01/18/13 12:19 PM      Result Value Range Status   Specimen Description URINE, CLEAN CATCH   Final   Special Requests NONE   Final   Culture  Setup Time     Final   Value: 01/18/2013 14:50     Performed at [facility] Count     Final   Value: >=100,000 COLONIES/ML     Performed at [facility]   Culture     Final   Value: ESCHERICHIA COLI     Performed at [facility]   Report Status 01/20/2013 FINAL   Final   Organism ID, Bacteria ESCHERICHIA COLI   Final  SURGICAL PCR SCREEN     Status: None   Collection Time    01/23/13  7:45 AM      Result Value Range Status   MRSA, PCR NEGATIVE  NEGATIVE Final   Staphylococcus aureus NEGATIVE  NEGATIVE Final   Comment:            The Xpert SA Assay (FDA     approved for NASAL specimens     in patients over 32 years of age),     is one component of     a comprehensive surveillance     program.  Test performance has     been validated by [employer] for patients greater     than or equal to 55 year old.     It is not intended     to diagnose infection nor to     guide or monitor treatment.     Studies: No results found.  Scheduled Meds: . ciprofloxacin  500 mg Oral BID  . citalopram  20 mg Oral Daily  . docusate sodium  100 mg Oral BID  . ferric gluconate (FERRLECIT/NULECIT)  IV  250 mg Intravenous QODAY  . insulin aspart  0-9 Units Subcutaneous TID WC  . losartan  50 mg Oral Daily  . mometasone-formoterol  2 puff Inhalation BID  . pantoprazole  40 mg  Oral BID WC  . potassium chloride  40 mEq Oral Once  . sodium chloride  3 mL Intravenous Q12H   Continuous Infusions: . sodium chloride 75 mL/hr at 01/24/13 0519  . sodium chloride irrigation      Principal Problem:   Hematuria, gross Active Problems:   COPD (chronic obstructive pulmonary disease)   Diabetes mellitus type 2 in obese   Anemia   Depression   Hypertension   Hyperlipidemia   Esophageal reflux   E. coli UTI   Hypokalemia   Weakness   Tachycardia   Bladder tumor    Time spent: 30 mins    [facility]  [facility] Pager [phone]. If 7PM-7AM, please contact night-coverage at [email or web address], password [facility] 01/24/2013, 2:11 PM  LOS: 6 days

## 2013-01-24 NOTE — Progress Notes (Signed)
Patient  Complained of severe pain/ discomfort. Explained that its bladder spasm, given B&O but did not help, given Dilaudid IV and suddenly complained of brief  numbness on her left arm, no complaints of chest pains, flushed face. And sudden hoarseness of voice.  V/s checked (see flowsheet).  Seen by MD.  Patient complained again of discomfort and had passed gas and liquid BM   moderate amount, verbalized relief.

## 2013-01-25 DIAGNOSIS — R5381 Other malaise: Secondary | ICD-10-CM

## 2013-01-25 LAB — CBC
HCT: 27 % — ABNORMAL LOW (ref 36.0–46.0)
Platelets: 200 10*3/uL (ref 150–400)
RDW: 16.5 % — ABNORMAL HIGH (ref 11.5–15.5)
WBC: 8.9 10*3/uL (ref 4.0–10.5)

## 2013-01-25 LAB — BASIC METABOLIC PANEL
CO2: 23 mEq/L (ref 19–32)
Calcium: 8.1 mg/dL — ABNORMAL LOW (ref 8.4–10.5)
Chloride: 105 mEq/L (ref 96–112)
Creatinine, Ser: 0.66 mg/dL (ref 0.50–1.10)
GFR calc non Af Amer: >90 mL/min (ref >90)
Glucose, Bld: 114 mg/dL — ABNORMAL HIGH (ref 70–99)
Potassium: 3.2 mEq/L — ABNORMAL LOW (ref 3.5–5.1)
Sodium: 136 mEq/L (ref 135–145)

## 2013-01-25 LAB — GLUCOSE, CAPILLARY
Glucose-Capillary: 206 mg/dL — ABNORMAL HIGH (ref 70–99)
Glucose-Capillary: 156 mg/dL — ABNORMAL HIGH (ref 70–99)

## 2013-01-25 LAB — TYPE AND SCREEN
Antibody Screen: NEGATIVE
Unit division: 0
Unit division: 0

## 2013-01-25 LAB — HEMOGLOBIN AND HEMATOCRIT, BLOOD
HCT: 25.8 % — ABNORMAL LOW (ref 36.0–46.0)
Hemoglobin: 8.4 g/dL — ABNORMAL LOW (ref 12.0–15.0)

## 2013-01-25 NOTE — Progress Notes (Signed)
2 Days Post-Op Subjective: Patient reports several bladder spasms yesterday which were pretty painful.  Currently, she is comfortable  Objective: Vital signs in last 24 hours: Temp:  [98.2 F (36.8 C)-98.9 F (37.2 C)] 98.7 F (37.1 C) (11/12 0500) Pulse Rate:  [89-102] 89 (11/12 0500) Resp:  [14-20] 16 (11/12 0500) BP: (82-145)/(34-70) 109/65 mmHg (11/12 0500) SpO2:  [94 %-99 %] 95 % (11/12 0500) Weight:  [94.9 kg (209 lb 3.5 oz)] 94.9 kg (209 lb 3.5 oz) (11/12 0500)  Intake/Output from previous day: 11/11 0701 - 11/12 0700 In: 91478 [P.O.:960; I.V.:1275; IV Piggyback:240] Out: 9750 [Urine:9750] Intake/Output this shift: Total I/O In: -  Out: 800 [Urine:800]  Physical Exam:  Constitutional: Vital signs reviewed. WD WN in NAD    Irrigant is clear  Lab Results:  Recent Labs  01/24/13 0637 01/24/13 2057 01/25/13 0445  HGB 8.9* 10.2* 8.9*  HCT 26.3* 30.3* 27.0*   BMET  Recent Labs  01/24/13 0637 01/25/13 0445  NA 134* 136  K 3.4* 3.2*  CL 101 105  CO2 24 23  GLUCOSE 114* 114*  BUN 13 8  CREATININE 0.70 0.66  CALCIUM 8.1* 8.1*   No results found for this basename: LABPT, INR,  in the last 72 hours No results found for this basename: LABURIN,  in the last 72 hours Results for orders placed during the hospital encounter of 01/18/13  URINE CULTURE     Status: None   Collection Time    01/18/13 12:19 PM      Result Value Range Status   Specimen Description URINE, CLEAN CATCH   Final   Special Requests NONE   Final   Culture  Setup Time     Final   Value: 01/18/2013 14:50     Performed at [facility] Count     Final   Value: >=100,000 COLONIES/ML     Performed at [facility]   Culture     Final   Value: ESCHERICHIA COLI     Performed at [facility]   Report Status 01/20/2013 FINAL   Final   Organism ID, Bacteria ESCHERICHIA COLI   Final  SURGICAL PCR SCREEN     Status: None   Collection Time    01/23/13  7:45 AM       Result Value Range Status   MRSA, PCR NEGATIVE  NEGATIVE Final   Staphylococcus aureus NEGATIVE  NEGATIVE Final   Comment:            The Xpert SA Assay (FDA     approved for NASAL specimens     in patients over 37 years of age),     is one component of     a comprehensive surveillance     program.  Test performance has     been validated by [employer] for patients greater     than or equal to 44 year old.     It is not intended     to diagnose infection nor to     guide or monitor treatment.    Studies/Results: No results found.  Assessment/Plan:   Postoperative day #2 TURBT of large bladder tumor.  Overall she is doing well.  Urine is clearing, she is still having some bladder spasms.  I will order a repeat hematocrit this afternoon.  Her CBI was clamped.  We will saline lock her IV, get her up and around, and hopefully her  urine stayed clear.   LOS: 7 days   [name] M 01/25/2013, 8:28 AM

## 2013-01-25 NOTE — Progress Notes (Signed)
Leg bag administration demonstrated to patient and dtr. Extra leg bad and night bag given to dtr for home use.

## 2013-01-25 NOTE — Progress Notes (Signed)
[facility] PROGRESS NOTE  Savannah Stanley ZOX:[id number] DOB: [birth year] DOA: 01/18/2013 PCP: Danise Edge, MD  Assessment/Plan: #1 gross hematuria Secondary to bladder tumor noted on CT of the abdomen and pelvis likely consistent with a urothelial carcinoma of the bladder. Patient still with hematuria. Patient status post TURBT. Urology following and appreciate input and recommendations.  #2 acute blood loss anemia/severe iron deficiency anemia Likely secondary to gross hematuria secondary to bladder tumor noted on CT of the abdomen and pelvis versus possible GI etiology as well. Anemia panel consistent with severe iron deficiency anemia. Hemoglobin on admission was 4.5. Patient is status post 6 units packed red blood cells. Repeat H&H with hemoglobin of 8.9 today. GI has assessed patient and patient will likely benefit from a colonoscopy however this be deferred to the outpatient setting. Urology following. Patient was started on IV iron. Will likely need to be on iron supplementations on discharge.  #3 bladder tumor Noted per CT of the abdomen and pelvis. Patient status post TURBT yesterday per urology. Per urology.  #4 E. coli urinary tract infection Continue oral ciprofloxacin.   #5 hypokalemia Replete. Magnesium level of 2.  #6 gastroesophageal reflux disease PPI BID. GI cocktail when necessary.  #7 sinus tachycardia Likely secondary to symptomatic anemia. Improved.  #8 COPD Stable. Nebs as needed. Continue Advair.  #9 depression Decreased dose of Celexa to 20 mg daily.  #10 borderline diabetes mellitus Hemoglobin A1c is 5.9. Glucophage on hold. Continue sliding scale insulin.  #11 prophylaxis PPI for GI prophylaxis. SCDs for DVT prophylaxis.   Code Status: Full. Family Communication: Updated patient, brother, friend at bedside. Disposition Plan: Home when medically stable.   Consultants:  GI: Dr. Rhea Belton 01/18/2013  Urology: Dr. Retta Diones  01/18/2013  Procedures:  CT abdomen and pelvis 01/18/2013  4 units packed red blood cells 01/18/2013 to 01/19/2013, 2 units packed red blood cells 01/23/2013  Chest x-ray 01/18/2013  Status post TURBT 01/23/2013 by Dr. Retta Diones  Antibiotics:  IV Rocephin 01/18/2013---> 01/21/2013  Oral ciprofloxacin 01/21/2013  HPI/Subjective: Patient states still with hematuria. Patient denies emesis today. Patient complaining of some pressure in the bladder.  Objective: Filed Vitals:   01/25/13 1556  BP: 128/57  Pulse: 74  Temp: 98.2 F (36.8 C)  Resp: 18    Intake/Output Summary (Last 24 hours) at 01/25/13 1630 Last data filed at 01/25/13 0805  Gross per 24 hour  Intake   8535 ml  Output   7500 ml  Net   1035 ml   Filed Weights   01/23/13 0530 01/24/13 0739 01/25/13 0500  Weight: 93 kg (205 lb 0.4 oz) 95.7 kg (210 lb 15.7 oz) 94.9 kg (209 lb 3.5 oz)    Exam:   General:  NAD  Cardiovascular: RRR  Respiratory: CTAB  Abdomen: Soft/NT/ND/+BS  Musculoskeletal: 4/5 bue strength, 4/5 BLE  Data Reviewed: Basic Metabolic Panel:  Recent Labs Lab 01/18/13 1633  01/21/13 0505 01/22/13 0519 01/23/13 0514 01/24/13 0637 01/25/13 0445  NA 136  < > 136 137 137 134* 136  K 3.5  < > 3.7 3.6 3.5 3.4* 3.2*  CL 101  < > 101 102 103 101 105  CO2 25  < > 24 26 23 24 23   GLUCOSE 133*  < > 152* 132* 134* 114* 114*  BUN 13  < > 20 22 20 13 8   CREATININE 0.61  < > 0.68 0.64 0.71 0.70 0.66  CALCIUM 8.6  < > 8.5 8.9 8.6 8.1* 8.1*  MG 2.0  --   --   --   --   --   --   < > =  values in this interval not displayed. Liver Function Tests:  Recent Labs Lab 01/18/13 1633  AST 12  ALT 7  ALKPHOS 38*  BILITOT 0.2*  PROT 5.5*  ALBUMIN 3.0*   No results found for this basename: LIPASE, AMYLASE,  in the last 168 hours No results found for this basename: AMMONIA,  in the last 168 hours CBC:  Recent Labs Lab 01/18/13 1633  01/22/13 1617 01/23/13 0514 01/23/13 1740  01/24/13 0637 01/24/13 2057 01/25/13 0445 01/25/13 1545  WBC 7.7  < > 18.4* 10.8*  --  9.5 18.3* 8.9  --   NEUTROABS 5.1  --   --   --   --   --   --   --   --   HGB 4.5*  < > 10.7* 8.6* 7.1* 8.9* 10.2* 8.9* 8.4*  HCT 14.4*  < > 33.2* 26.2* 21.6* 26.3* 30.3* 27.0* 25.8*  MCV 90.0  < > 93.0 92.6  --  91.6 91.3 93.4  --   PLT 276  < > 347 302  --  217 299 200  --   < > = values in this interval not displayed. Cardiac Enzymes: No results found for this basename: CKTOTAL, CKMB, CKMBINDEX, TROPONINI,  in the last 168 hours BNP (last 3 results)  Recent Labs  01/18/13 1045  PROBNP 160.9*   CBG:  Recent Labs Lab 01/24/13 1214 01/24/13 1715 01/24/13 2117 01/25/13 0723 01/25/13 1213  GLUCAP 160* 163* 131* 206* 91    Recent Results (from the past 240 hour(s))  URINE CULTURE     Status: None   Collection Time    01/18/13 12:19 PM      Result Value Range Status   Specimen Description URINE, CLEAN CATCH   Final   Special Requests NONE   Final   Culture  Setup Time     Final   Value: 01/18/2013 14:50     Performed at [facility] Count     Final   Value: >=100,000 COLONIES/ML     Performed at [facility]   Culture     Final   Value: ESCHERICHIA COLI     Performed at [facility]   Report Status 01/20/2013 FINAL   Final   Organism ID, Bacteria ESCHERICHIA COLI   Final  SURGICAL PCR SCREEN     Status: None   Collection Time    01/23/13  7:45 AM      Result Value Range Status   MRSA, PCR NEGATIVE  NEGATIVE Final   Staphylococcus aureus NEGATIVE  NEGATIVE Final   Comment:            The Xpert SA Assay (FDA     approved for NASAL specimens     in patients over 21 years of age),     is one component of     a comprehensive surveillance     program.  Test performance has     been validated by [employer] for patients greater     than or equal to 46 year old.     It is not intended     to diagnose infection nor to     guide or monitor  treatment.     Studies: No results found.  Scheduled Meds: . ciprofloxacin  500 mg Oral BID  . citalopram  20 mg Oral Daily  . docusate sodium  100 mg Oral BID  . ferric gluconate (FERRLECIT/NULECIT)  IV  250 mg Intravenous QODAY  . insulin aspart  0-9 Units Subcutaneous TID WC  . losartan  50 mg Oral Daily  . mometasone-formoterol  2 puff Inhalation BID  . pantoprazole  40 mg Oral BID WC  . potassium chloride  40 mEq Oral Once  . sodium chloride  3 mL Intravenous Q12H   Continuous Infusions: . sodium chloride 75 mL/hr at 01/24/13 1915  . sodium chloride irrigation      Principal Problem:   Hematuria, gross Active Problems:   COPD (chronic obstructive pulmonary disease)   Diabetes mellitus type 2 in obese   Anemia   Depression   Hypertension   Hyperlipidemia   Esophageal reflux   E. coli UTI   Hypokalemia   Weakness   Tachycardia   Bladder tumor    Time spent: 30 mins    Savannah Stanley  [facility] Pager [phone] If 7PM-7AM, please contact night-coverage at [email or web address], password [facility] 01/25/2013, 4:30 PM  LOS: 7 days

## 2013-01-26 LAB — GLUCOSE, CAPILLARY: Glucose-Capillary: 113 mg/dL — ABNORMAL HIGH (ref 70–99)

## 2013-01-26 MED ORDER — CIPROFLOXACIN HCL 500 MG PO TABS: 250.0000 mg | ORAL_TABLET | Freq: Two times a day (BID) | ORAL | Status: DC

## 2013-01-26 MED ORDER — HYDROCODONE-ACETAMINOPHEN 5-325 MG PO TABS: 1.0000 | ORAL_TABLET | ORAL | Status: DC | PRN

## 2013-01-26 MED ORDER — DSS 100 MG PO CAPS: 100.0000 mg | ORAL_CAPSULE | Freq: Two times a day (BID) | ORAL | Status: DC

## 2013-01-26 MED ORDER — OXYBUTYNIN CHLORIDE 5 MG PO TABS: 5.0000 mg | ORAL_TABLET | Freq: Three times a day (TID) | ORAL | Status: DC | PRN

## 2013-01-26 NOTE — Progress Notes (Signed)
Discharge instructions explained using teach back, prescriptions given. Stable on discharge.

## 2013-01-30 ENCOUNTER — Other Ambulatory Visit: Payer: Self-pay | Admitting: Urology

## 2013-02-01 ENCOUNTER — Ambulatory Visit (INDEPENDENT_AMBULATORY_CARE_PROVIDER_SITE_OTHER): Payer: 59 | Admitting: Family Medicine

## 2013-02-01 ENCOUNTER — Encounter: Payer: Self-pay | Admitting: Family Medicine

## 2013-02-01 VITALS — BP 132/78 | HR 97 | Temp 98.2°F | Ht 63.0 in | Wt 205.1 lb

## 2013-02-01 DIAGNOSIS — I10 Essential (primary) hypertension: Secondary | ICD-10-CM

## 2013-02-01 DIAGNOSIS — E119 Type 2 diabetes mellitus without complications: Secondary | ICD-10-CM

## 2013-02-01 DIAGNOSIS — C680 Malignant neoplasm of urethra: Secondary | ICD-10-CM

## 2013-02-01 DIAGNOSIS — R000 Tachycardia, unspecified: Secondary | ICD-10-CM

## 2013-02-01 DIAGNOSIS — E669 Obesity, unspecified: Secondary | ICD-10-CM

## 2013-02-01 DIAGNOSIS — D649 Anemia, unspecified: Secondary | ICD-10-CM

## 2013-02-01 DIAGNOSIS — E1169 Type 2 diabetes mellitus with other specified complication: Secondary | ICD-10-CM

## 2013-02-01 LAB — LIPID PANEL
LDL Cholesterol: 113 mg/dL — ABNORMAL HIGH (ref 0–99)
Total CHOL/HDL Ratio: 3.1 Ratio

## 2013-02-01 LAB — HEPATIC FUNCTION PANEL
AST: 16 U/L (ref 0–37)
Albumin: 4 g/dL (ref 3.5–5.2)
Alkaline Phosphatase: 52 U/L (ref 39–117)
Bilirubin, Direct: 0.1 mg/dL (ref 0.0–0.3)
Total Bilirubin: 0.3 mg/dL (ref 0.3–1.2)

## 2013-02-01 LAB — HEMOGLOBIN A1C
Hgb A1c MFr Bld: 5.3 % (ref <5.7)
Mean Plasma Glucose: 105 mg/dL (ref <117)

## 2013-02-01 LAB — RENAL FUNCTION PANEL
Albumin: 4 g/dL (ref 3.5–5.2)
CO2: 25 mEq/L (ref 19–32)
Chloride: 103 mEq/L (ref 96–112)
Creat: 0.65 mg/dL (ref 0.50–1.10)
Glucose, Bld: 128 mg/dL — ABNORMAL HIGH (ref 70–99)
Phosphorus: 3.7 mg/dL (ref 2.3–4.6)
Potassium: 3.9 mEq/L (ref 3.5–5.3)
Sodium: 140 mEq/L (ref 135–145)

## 2013-02-01 LAB — CBC
HCT: 32.9 % — ABNORMAL LOW (ref 36.0–46.0)
MCH: 30.4 pg (ref 26.0–34.0)
MCHC: 33.1 g/dL (ref 30.0–36.0)
RDW: 15.8 % — ABNORMAL HIGH (ref 11.5–15.5)

## 2013-02-01 NOTE — Progress Notes (Signed)
Pre visit review using our clinic review tool, if applicable. No additional management support is needed unless otherwise documented below in the visit note. 

## 2013-02-01 NOTE — Patient Instructions (Signed)

## 2013-02-02 ENCOUNTER — Telehealth: Payer: Self-pay

## 2013-02-02 DIAGNOSIS — D649 Anemia, unspecified: Secondary | ICD-10-CM

## 2013-02-02 DIAGNOSIS — Z0000 Encounter for general adult medical examination without abnormal findings: Secondary | ICD-10-CM

## 2013-02-02 NOTE — Telephone Encounter (Signed)
Lab order placed.

## 2013-02-02 NOTE — Progress Notes (Signed)
Quick Note:  Patient Informed and voiced understanding.  Lab add on given to [employer].  Renal and vitamin d added ______

## 2013-02-02 NOTE — Telephone Encounter (Signed)
Lab order for PTH ordered. This is a frozen test

## 2013-02-05 ENCOUNTER — Encounter: Payer: Self-pay | Admitting: Family Medicine

## 2013-02-05 NOTE — Assessment & Plan Note (Signed)
Well controlled, no changes 

## 2013-02-05 NOTE — Assessment & Plan Note (Signed)
Tolerated surgery feeling better.

## 2013-02-05 NOTE — Assessment & Plan Note (Signed)
Mild, will monitor 

## 2013-02-05 NOTE — Progress Notes (Signed)
Patient ID: Savannah Stanley, female   DOB: [birth year], 62 y.o.   MRN: [id number] CLEO SANTUCCI [id number] 10/31/1950 02/05/2013      Progress Note-Follow Up  Subjective  Chief Complaint  Chief Complaint  Patient presents with  . Follow-up    hospital follow up    HPI  Patient is a 62 year old Caucasian female who is here in followup. She's recently been in the hospital for resection of a urethral carcinoma tumor. She tolerated that well. She denies hematuria or dysuria. Says she feels well. She denies GI or GU concerns. No chest pain, palpitations, shortness of breath, fevers or headaches. She is taking medications as prescribed  Past Medical History  Diagnosis Date  . Esophageal reflux 08/25/2012  . Anemia   . Diabetes mellitus without complication   . Hypertension   . COPD (chronic obstructive pulmonary disease)   . Urethral carcinoma 01/19/2013    Past Surgical History  Procedure Laterality Date  . Appendectomy    . Tubal ligation    . Transurethral resection of bladder tumor with gyrus (turbt-gyrus) N/A 01/23/2013    Procedure: TRANSURETHRAL RESECTION OF BLADDER TUMOR WITH GYRUS ;  Surgeon: [name], MD;  Location: [facility];  Service: Urology;  Laterality: N/A;    Family History  Problem Relation Age of Onset  . Cancer Father     prostate    History   Social History  . Marital Status: Widowed    Spouse Name: N/A    Number of Children: N/A  . Years of Education: N/A   Occupational History  . Not on file.   Social History Main Topics  . Smoking status: Former Smoker -- 1.00 packs/day for 30 years    Types: Cigarettes    Quit date: 01/19/2011  . Smokeless tobacco: Never Used  . Alcohol Use: No  . Drug Use: No  . Sexual Activity: Not on file   Other Topics Concern  . Not on file   Social History Narrative  . No narrative on file    Current Outpatient Prescriptions on File Prior to Visit  Medication Sig Dispense Refill  . citalopram (CELEXA)  40 MG tablet Take 40 mg by mouth daily.      . ferrous sulfate (FEOSOL) 325 (65 FE) MG tablet Take 325 mg by mouth daily with breakfast.        . losartan (COZAAR) 50 MG tablet Take 1 tablet (50 mg total) by mouth daily.  90 tablet  4  . metFORMIN (GLUCOPHAGE) 500 MG tablet Take 500 mg by mouth daily with breakfast.      . omeprazole (PRILOSEC) 20 MG capsule Take 1 capsule by mouth two times daily  180 capsule  0  . oxybutynin (DITROPAN) 5 MG tablet Take 1 tablet (5 mg total) by mouth every 8 (eight) hours as needed for bladder spasms.  30 tablet  1   Current Facility-Administered Medications on File Prior to Visit  Medication Dose Route Frequency Provider Last Rate Last Dose  . mitoMYcin (MUTAMYCIN) chemo injection 40 mg  40 mg Bladder Instillation Once [name], MD        Allergies  Allergen Reactions  . Bee Venom Shortness Of Breath    MSG.  . Other Shortness Of Breath    MSG.    Review of Systems  Review of Systems  Constitutional: Negative for fever and malaise/fatigue.  HENT: Negative for congestion.   Eyes: Negative for discharge.  Respiratory: Negative for shortness of breath.  Cardiovascular: Negative for chest pain, palpitations and leg swelling.  Gastrointestinal: Negative for nausea, abdominal pain and diarrhea.  Genitourinary: Negative for dysuria.  Musculoskeletal: Negative for falls.  Skin: Negative for rash.  Neurological: Negative for loss of consciousness and headaches.  Endo/Heme/Allergies: Negative for polydipsia.  Psychiatric/Behavioral: Negative for depression and suicidal ideas. The patient is not nervous/anxious and does not have insomnia.     Objective  BP 132/78  Pulse 97  Temp(Src) 98.2 F (36.8 C) (Oral)  Ht 5\' 3"  (1.6 m)  Wt 205 lb 1.9 oz (93.042 kg)  BMI 36.34 kg/m2  SpO2 97%  Physical Exam  Physical Exam  Constitutional: She is oriented to person, place, and time and well-developed, well-nourished, and in no distress. No  distress.  HENT:  Head: Normocephalic and atraumatic.  Eyes: Conjunctivae are normal.  Neck: Neck supple. No thyromegaly present.  Cardiovascular: Normal rate, regular rhythm and normal heart sounds.   No murmur heard. Pulmonary/Chest: Effort normal and breath sounds normal. She has no wheezes.  Abdominal: She exhibits no distension and no mass.  Musculoskeletal: She exhibits no edema.  Lymphadenopathy:    She has no cervical adenopathy.  Neurological: She is alert and oriented to person, place, and time.  Skin: Skin is warm and dry. No rash noted. She is not diaphoretic.  Psychiatric: Memory, affect and judgment normal.    Lab Results  Component Value Date   TSH 0.866 08/19/2012   Lab Results  Component Value Date   WBC 4.1 02/01/2013   HGB 10.9* 02/01/2013   HCT 32.9* 02/01/2013   MCV 91.9 02/01/2013   PLT 282 02/01/2013   Lab Results  Component Value Date   CREATININE 0.65 02/01/2013   BUN 7 02/01/2013   NA 140 02/01/2013   K 3.9 02/01/2013   CL 103 02/01/2013   CO2 25 02/01/2013   Lab Results  Component Value Date   ALT 10 02/01/2013   AST 16 02/01/2013   ALKPHOS 52 02/01/2013   BILITOT 0.3 02/01/2013   Lab Results  Component Value Date   CHOL 198 02/01/2013   Lab Results  Component Value Date   HDL 63 02/01/2013   Lab Results  Component Value Date   LDLCALC 113* 02/01/2013   Lab Results  Component Value Date   TRIG 110 02/01/2013   Lab Results  Component Value Date   CHOLHDL 3.1 02/01/2013     Assessment & Plan  Hypertension Well controlled, no changes  Urethral carcinoma Tolerated surgery feeling better.  Tachycardia Mild, will monitor  Anemia Improving, increase leafy greens and red meat.  Diabetes mellitus type 2 in obese Recent good control, minimize simple carbs and continue current meds

## 2013-02-05 NOTE — Assessment & Plan Note (Signed)
Improving, increase leafy greens and red meat.

## 2013-02-05 NOTE — Assessment & Plan Note (Signed)
Recent good control, minimize simple carbs and continue current meds

## 2013-02-10 ENCOUNTER — Other Ambulatory Visit: Payer: Self-pay | Admitting: Family Medicine

## 2013-02-10 LAB — RENAL FUNCTION PANEL
Albumin: 4.3 g/dL (ref 3.5–5.2)
CO2: 27 mEq/L (ref 19–32)
Calcium: 9.3 mg/dL (ref 8.4–10.5)
Chloride: 102 mEq/L (ref 96–112)
Creat: 0.58 mg/dL (ref 0.50–1.10)
Glucose, Bld: 96 mg/dL (ref 70–99)
Phosphorus: 3.6 mg/dL (ref 2.3–4.6)
Potassium: 4.4 mEq/L (ref 3.5–5.3)
Sodium: 137 mEq/L (ref 135–145)

## 2013-02-11 LAB — VITAMIN D 25 HYDROXY (VIT D DEFICIENCY, FRACTURES): Vit D, 25-Hydroxy: 32 ng/mL (ref 30–89)

## 2013-02-13 ENCOUNTER — Encounter (HOSPITAL_BASED_OUTPATIENT_CLINIC_OR_DEPARTMENT_OTHER): Payer: Self-pay | Admitting: *Deleted

## 2013-02-14 ENCOUNTER — Encounter (HOSPITAL_BASED_OUTPATIENT_CLINIC_OR_DEPARTMENT_OTHER): Payer: Self-pay | Admitting: *Deleted

## 2013-02-14 LAB — PTH, INTACT AND CALCIUM
Calcium: 9.4 mg/dL (ref 8.4–10.5)
PTH: 50.1 pg/mL (ref 14.0–72.0)

## 2013-02-14 NOTE — Progress Notes (Signed)
NPO AFTER MN WITH EXCEPTION WATER/ GATORADE/ BLACK COFFEE (NO CREAM/ MILK PRODUCTS) UNTIL 0730. NEEDS ISTAT. CURRENT EKG IN EPIC AND CHART. WILL TAKE COZAAR AND PRILOSEC AM DOS W/ SIP OF WATER. PT AWARE OWER AT MAIN.

## 2013-02-15 NOTE — H&P (Signed)
H&P  Chief Complaint: Bladder cancer  History of Present Illness: Savannah Stanley is a 62 y.o. year old female who presents for repeat TUR-BT.  I saw this lady in consultation on November 5 for gross hematuria and anemia. She ended up having a very large bladder tumor. She had a TURBT on 01/23/2013. That was a lengthy procedure, and I was unable to resect all of her bladder tumor. Pathology revealed high-grade urothelial carcinoma and a papillary configuration with no evidence of stromal or muscular involvement. She did have a couple of small perforations, and I left her catheter in. She was discharged home on postoperative day 3.  She required transfusions both before and after her resection. It was obvious that her bleeding had been going on for quite some time before her presentation to the hospital for weakness and gross hematuria.     Past Medical History  Diagnosis Date  . Hypertension   . GERD (gastroesophageal reflux disease)   . H/O hiatal hernia   . Type 2 diabetes mellitus   . Iron deficiency anemia   . Bladder cancer UROLOGIST-- DR Retta Diones    HIGH-GRADE UROTHELIAL CARCINOMA    Past Surgical History  Procedure Laterality Date  . Transurethral resection of bladder tumor with gyrus (turbt-gyrus) N/A 01/23/2013    Procedure: TRANSURETHRAL RESECTION OF BLADDER TUMOR WITH GYRUS ;  Surgeon: [name], MD;  Location: [facility];  Service: Urology;  Laterality: N/A;  . Appendectomy  1980'S  . Tubal ligation  1980'S    Home Medications:  No prescriptions prior to admission    Allergies:  Allergies  Allergen Reactions  . Bee Venom Shortness Of Breath  . Other Shortness Of Breath    MSG.  . Latex Itching    Family History  Problem Relation Age of Onset  . Cancer Father     prostate    Social History:  reports that she quit smoking about 3 years ago. Her smoking use included Cigarettes. She has a 35 pack-year smoking history. She has never used smokeless tobacco.  She reports that she does not drink alcohol or use illicit drugs.  ROS: A complete review of systems was performed.  All systems are negative except for pertinent findings as noted.  Physical Exam:  Vital signs in last 24 hours:   General:  Alert and oriented, No acute distress HEENT: Normocephalic, atraumatic Neck: No JVD or lymphadenopathy Cardiovascular: Regular rate and rhythm Lungs: Clear bilaterally Abdomen: Soft, nontender, nondistended, no abdominal masses Back: No CVA tenderness Extremities: No edema Neurologic: Grossly intact  Laboratory Data:  No results found for this or any previous visit (from the past 24 hour(s)). No results found for this or any previous visit (from the past 240 hour(s)). Creatinine:  Recent Labs  02/10/13 1031  CREATININE 0.58    Radiologic Imaging: No results found.  Impression/Assessment:  Bladder cancer  Plan:  Repeat TUR-BT  [name] 02/15/2013, 9:14 PM  Bertram Millard. Ivelis Norgard MD

## 2013-02-16 ENCOUNTER — Encounter (HOSPITAL_COMMUNITY)
Admission: RE | Disposition: A | Discharge status: Home / Self Care | Payer: Self-pay | Source: Ambulatory Visit | Attending: Urology

## 2013-02-16 ENCOUNTER — Observation Stay (HOSPITAL_BASED_OUTPATIENT_CLINIC_OR_DEPARTMENT_OTHER)
Admission: RE | Admit: 2013-02-16 | Discharge: 2013-02-17 | Disposition: A | Discharge status: Home / Self Care | Payer: 59 | Source: Ambulatory Visit | Attending: Urology | Admitting: Urology

## 2013-02-16 ENCOUNTER — Encounter (HOSPITAL_BASED_OUTPATIENT_CLINIC_OR_DEPARTMENT_OTHER): Payer: Self-pay | Admitting: *Deleted

## 2013-02-16 ENCOUNTER — Encounter (HOSPITAL_BASED_OUTPATIENT_CLINIC_OR_DEPARTMENT_OTHER): Payer: 59 | Admitting: Anesthesiology

## 2013-02-16 ENCOUNTER — Ambulatory Visit (HOSPITAL_BASED_OUTPATIENT_CLINIC_OR_DEPARTMENT_OTHER): Payer: 59 | Admitting: Anesthesiology

## 2013-02-16 DIAGNOSIS — N323 Diverticulum of bladder: Secondary | ICD-10-CM | POA: Insufficient documentation

## 2013-02-16 DIAGNOSIS — E119 Type 2 diabetes mellitus without complications: Secondary | ICD-10-CM | POA: Insufficient documentation

## 2013-02-16 DIAGNOSIS — Z87891 Personal history of nicotine dependence: Secondary | ICD-10-CM | POA: Insufficient documentation

## 2013-02-16 DIAGNOSIS — K219 Gastro-esophageal reflux disease without esophagitis: Secondary | ICD-10-CM | POA: Insufficient documentation

## 2013-02-16 DIAGNOSIS — C679 Malignant neoplasm of bladder, unspecified: Principal | ICD-10-CM | POA: Diagnosis present

## 2013-02-16 DIAGNOSIS — I10 Essential (primary) hypertension: Secondary | ICD-10-CM | POA: Insufficient documentation

## 2013-02-16 HISTORY — DX: Iron deficiency anemia, unspecified: D50.9

## 2013-02-16 HISTORY — DX: Malignant neoplasm of bladder, unspecified: C67.9

## 2013-02-16 HISTORY — DX: Gastro-esophageal reflux disease without esophagitis: K21.9

## 2013-02-16 HISTORY — DX: Depression, unspecified: F32.A

## 2013-02-16 HISTORY — DX: Type 2 diabetes mellitus without complications: E11.9

## 2013-02-16 HISTORY — DX: Major depressive disorder, single episode, unspecified: F32.9

## 2013-02-16 HISTORY — PX: TRANSURETHRAL RESECTION OF BLADDER TUMOR: SHX2575

## 2013-02-16 HISTORY — DX: Personal history of other diseases of the digestive system: Z87.19

## 2013-02-16 LAB — POCT I-STAT 4, (NA,K, GLUC, HGB,HCT)
HCT: 38 % (ref 36.0–46.0)
Hemoglobin: 12.9 g/dL (ref 12.0–15.0)
Potassium: 3.7 mEq/L (ref 3.5–5.1)
Sodium: 137 mEq/L (ref 135–145)

## 2013-02-16 LAB — GLUCOSE, CAPILLARY: Glucose-Capillary: 116 mg/dL — ABNORMAL HIGH (ref 70–99)

## 2013-02-16 SURGERY — TURBT (TRANSURETHRAL RESECTION OF BLADDER TUMOR)
Anesthesia: General

## 2013-02-16 MED ORDER — FENTANYL CITRATE 0.05 MG/ML IJ SOLN
25.0000 ug | INTRAMUSCULAR | Status: DC | PRN
  Filled 2013-02-16: qty 1

## 2013-02-16 MED ORDER — SULFAMETHOXAZOLE-TMP DS 800-160 MG PO TABS: 1.0000 | ORAL_TABLET | Freq: Two times a day (BID) | ORAL | Status: DC

## 2013-02-16 MED ORDER — DEXAMETHASONE SODIUM PHOSPHATE 4 MG/ML IJ SOLN
INTRAMUSCULAR | Status: DC | PRN
  Administered 2013-02-16: 10 mg via INTRAVENOUS

## 2013-02-16 MED ORDER — LIDOCAINE HCL (CARDIAC) 20 MG/ML IV SOLN
INTRAVENOUS | Status: DC | PRN
  Administered 2013-02-16: 100 mg via INTRAVENOUS

## 2013-02-16 MED ORDER — LACTATED RINGERS IV SOLN
INTRAVENOUS | Status: DC
  Administered 2013-02-16: 13:00:00 via INTRAVENOUS
  Filled 2013-02-16: qty 1000

## 2013-02-16 MED ORDER — OXYBUTYNIN CHLORIDE 5 MG PO TABS
5.0000 mg | ORAL_TABLET | Freq: Three times a day (TID) | ORAL | Status: DC | PRN
  Administered 2013-02-16: 5 mg via ORAL
  Filled 2013-02-16 (×2): qty 1

## 2013-02-16 MED ORDER — LOSARTAN POTASSIUM 50 MG PO TABS
50.0000 mg | ORAL_TABLET | Freq: Every day | ORAL | Status: DC
  Administered 2013-02-16: 50 mg via ORAL
  Filled 2013-02-16 (×2): qty 1

## 2013-02-16 MED ORDER — SODIUM CHLORIDE 0.9 % IR SOLN
Status: DC | PRN
  Administered 2013-02-16: 18000 mL

## 2013-02-16 MED ORDER — CIPROFLOXACIN IN D5W 400 MG/200ML IV SOLN
INTRAVENOUS | Status: AC
  Filled 2013-02-16: qty 200

## 2013-02-16 MED ORDER — FENTANYL CITRATE 0.05 MG/ML IJ SOLN
INTRAMUSCULAR | Status: AC
  Filled 2013-02-16: qty 2

## 2013-02-16 MED ORDER — LACTATED RINGERS IV SOLN
INTRAVENOUS | Status: DC
  Filled 2013-02-16: qty 1000

## 2013-02-16 MED ORDER — PROPOFOL 10 MG/ML IV BOLUS
INTRAVENOUS | Status: DC | PRN
  Administered 2013-02-16: 200 mg via INTRAVENOUS

## 2013-02-16 MED ORDER — LACTATED RINGERS IV SOLN
INTRAVENOUS | Status: DC | PRN
  Administered 2013-02-16: 13:00:00 via INTRAVENOUS

## 2013-02-16 MED ORDER — CITALOPRAM HYDROBROMIDE 40 MG PO TABS
40.0000 mg | ORAL_TABLET | Freq: Every day | ORAL | Status: DC
  Administered 2013-02-16: 40 mg via ORAL
  Filled 2013-02-16 (×2): qty 1

## 2013-02-16 MED ORDER — MIDAZOLAM HCL 5 MG/5ML IJ SOLN
INTRAMUSCULAR | Status: DC | PRN
  Administered 2013-02-16 (×2): 1 mg via INTRAVENOUS

## 2013-02-16 MED ORDER — ZOLPIDEM TARTRATE 5 MG PO TABS
5.0000 mg | ORAL_TABLET | Freq: Every evening | ORAL | Status: DC | PRN
  Administered 2013-02-16: 5 mg via ORAL
  Filled 2013-02-16: qty 1

## 2013-02-16 MED ORDER — KETOROLAC TROMETHAMINE 30 MG/ML IJ SOLN
INTRAMUSCULAR | Status: DC | PRN
  Administered 2013-02-16: 30 mg via INTRAVENOUS

## 2013-02-16 MED ORDER — FENTANYL CITRATE 0.05 MG/ML IJ SOLN
INTRAMUSCULAR | Status: AC
  Filled 2013-02-16: qty 4

## 2013-02-16 MED ORDER — BELLADONNA ALKALOIDS-OPIUM 16.2-60 MG RE SUPP
RECTAL | Status: DC | PRN
  Administered 2013-02-16: 1 via RECTAL

## 2013-02-16 MED ORDER — SULFAMETHOXAZOLE-TMP DS 800-160 MG PO TABS
1.0000 | ORAL_TABLET | Freq: Two times a day (BID) | ORAL | Status: DC
  Administered 2013-02-16: 1 via ORAL
  Filled 2013-02-16 (×3): qty 1

## 2013-02-16 MED ORDER — ONDANSETRON HCL 4 MG/2ML IJ SOLN
INTRAMUSCULAR | Status: DC | PRN
  Administered 2013-02-16: 4 mg via INTRAVENOUS

## 2013-02-16 MED ORDER — ONDANSETRON HCL 4 MG/2ML IJ SOLN: 4.0000 mg | INTRAMUSCULAR | Status: DC | PRN

## 2013-02-16 MED ORDER — CIPROFLOXACIN IN D5W 400 MG/200ML IV SOLN
400.0000 mg | INTRAVENOUS | Status: AC
  Administered 2013-02-16: 400 mg via INTRAVENOUS
  Filled 2013-02-16: qty 200

## 2013-02-16 MED ORDER — DOCUSATE SODIUM 100 MG PO CAPS
100.0000 mg | ORAL_CAPSULE | Freq: Two times a day (BID) | ORAL | Status: DC
  Administered 2013-02-16: 100 mg via ORAL
  Filled 2013-02-16 (×3): qty 1

## 2013-02-16 MED ORDER — FENTANYL CITRATE 0.05 MG/ML IJ SOLN
INTRAMUSCULAR | Status: DC | PRN
  Administered 2013-02-16: 50 ug via INTRAVENOUS
  Administered 2013-02-16 (×6): 25 ug via INTRAVENOUS

## 2013-02-16 MED ORDER — OXYCODONE-ACETAMINOPHEN 5-325 MG PO TABS: 1.0000 | ORAL_TABLET | ORAL | Status: DC | PRN

## 2013-02-16 MED ORDER — KCL-LACTATED RINGERS-D5W 20 MEQ/L IV SOLN
INTRAVENOUS | Status: DC
  Filled 2013-02-16 (×3): qty 1000

## 2013-02-16 MED ORDER — METFORMIN HCL 500 MG PO TABS
500.0000 mg | ORAL_TABLET | Freq: Every day | ORAL | Status: DC
  Administered 2013-02-17: 500 mg via ORAL
  Filled 2013-02-16 (×2): qty 1

## 2013-02-16 MED ORDER — MIDAZOLAM HCL 2 MG/2ML IJ SOLN
INTRAMUSCULAR | Status: AC
  Filled 2013-02-16: qty 2

## 2013-02-16 MED ORDER — BELLADONNA ALKALOIDS-OPIUM 16.2-60 MG RE SUPP
RECTAL | Status: AC
  Filled 2013-02-16: qty 1

## 2013-02-16 MED ORDER — BELLADONNA ALKALOIDS-OPIUM 16.2-60 MG RE SUPP: 1.0000 | Freq: Four times a day (QID) | RECTAL | Status: DC | PRN

## 2013-02-16 MED ORDER — PANTOPRAZOLE SODIUM 40 MG PO TBEC
40.0000 mg | DELAYED_RELEASE_TABLET | Freq: Every day | ORAL | Status: DC
  Filled 2013-02-16: qty 1

## 2013-02-16 MED ORDER — HYDROMORPHONE HCL PF 1 MG/ML IJ SOLN: 0.5000 mg | INTRAMUSCULAR | Status: DC | PRN

## 2013-02-16 MED ORDER — OXYCODONE HCL 5 MG PO TABS: 5.0000 mg | ORAL_TABLET | ORAL | Status: DC | PRN

## 2013-02-16 SURGICAL SUPPLY — 33 items
BAG DRAIN URO-CYSTO SKYTR STRL (DRAIN) ×2 IMPLANT
BAG URINE DRAINAGE (UROLOGICAL SUPPLIES) ×2 IMPLANT
BAG URINE LEG 19OZ MD ST LTX (BAG) IMPLANT
BARD LATEX FREE ×2 IMPLANT
CANISTER SUCT LVC 12 LTR MEDI- (MISCELLANEOUS) ×6 IMPLANT
CATH FOLEY 2WAY SLVR  5CC 20FR (CATHETERS)
CATH FOLEY 2WAY SLVR  5CC 22FR (CATHETERS)
CATH FOLEY 2WAY SLVR 5CC 20FR (CATHETERS) IMPLANT
CATH FOLEY 2WAY SLVR 5CC 22FR (CATHETERS) IMPLANT
CLOTH BEACON ORANGE TIMEOUT ST (SAFETY) ×2 IMPLANT
DRAPE CAMERA CLOSED 9X96 (DRAPES) ×2 IMPLANT
ELECT BUTTON BIOP 24F 90D PLAS (MISCELLANEOUS) IMPLANT
ELECT LOOP HF 26F 30D .35MM (CUTTING LOOP) IMPLANT
ELECT NEEDLE 45D HF 24-28F 12D (CUTTING LOOP) IMPLANT
ELECT REM PT RETURN 9FT ADLT (ELECTROSURGICAL) ×2
ELECTRODE REM PT RTRN 9FT ADLT (ELECTROSURGICAL) ×1 IMPLANT
EVACUATOR MICROVAS BLADDER (UROLOGICAL SUPPLIES) ×2 IMPLANT
GLOVE BIO SURGEON STRL SZ8 (GLOVE) IMPLANT
GLOVE SKINSENSE NS SZ6.5 (GLOVE) ×2
GLOVE SKINSENSE NS SZ8.0 LF (GLOVE) ×1
GLOVE SKINSENSE STRL SZ6.5 (GLOVE) ×2 IMPLANT
GLOVE SKINSENSE STRL SZ8.0 LF (GLOVE) ×1 IMPLANT
GOWN STRL NON-REIN LRG LVL3 (GOWN DISPOSABLE) ×2 IMPLANT
GOWN STRL REIN XL XLG (GOWN DISPOSABLE) ×2 IMPLANT
GOWN XL W/COTTON TOWEL STD (GOWNS) ×2 IMPLANT
HOLDER FOLEY CATH W/STRAP (MISCELLANEOUS) ×2 IMPLANT
IV NS IRRIG 3000ML ARTHROMATIC (IV SOLUTION) ×10 IMPLANT
KIT ASPIRATION TUBING (SET/KITS/TRAYS/PACK) ×2 IMPLANT
LOOP CUTTING 24FR OLYMPUS (CUTTING LOOP) IMPLANT
PACK CYSTOSCOPY (CUSTOM PROCEDURE TRAY) ×2 IMPLANT
PLUG CATH AND CAP STER (CATHETERS) IMPLANT
SET ASPIRATION TUBING (TUBING) IMPLANT
SYRINGE IRR TOOMEY STRL 70CC (SYRINGE) IMPLANT

## 2013-02-16 NOTE — Transfer of Care (Signed)
Immediate Anesthesia Transfer of Care Note  Patient: Savannah Stanley  Procedure(s) Performed: Procedure(s) (LRB): TRANSURETHRAL RESECTION OF BLADDER TUMOR (TURBT) (N/A)  Patient Location: PACU  Anesthesia Type: General  Level of Consciousness: awake, sedated, patient cooperative and responds to stimulation  Airway & Oxygen Therapy: Patient Spontanous Breathing and Patient connected to face mask oxygen  Post-op Assessment: Report given to PACU RN, Post -op Vital signs reviewed and stable and Patient moving all extremities  Post vital signs: Reviewed and stable  Complications: No apparent anesthesia complications

## 2013-02-16 NOTE — Anesthesia Preprocedure Evaluation (Addendum)
Anesthesia Evaluation  Patient identified by MRN, date of birth, ID band Patient awake    Reviewed: Allergy & Precautions, H&P , NPO status , Patient's Chart, lab work & pertinent test results  Airway Mallampati: II TM Distance: >3 FB Neck ROM: full    Dental no notable dental hx. (+) Teeth Intact and Dental Advisory Given   Pulmonary COPDformer smoker,  breath sounds clear to auscultation  Pulmonary exam normal       Cardiovascular Exercise Tolerance: Good hypertension, Pt. on medications Rhythm:regular Rate:Normal  Prolonged QT   Neuro/Psych negative neurological ROS  negative psych ROS   GI/Hepatic negative GI ROS, Neg liver ROS, hiatal hernia, GERD-  Medicated and Controlled,  Endo/Other  diabetes, Well Controlled, Type 2, Oral Hypoglycemic Agents  Renal/GU negative Renal ROS  negative genitourinary   Musculoskeletal   Abdominal   Peds  Hematology negative hematology ROS (+)   Anesthesia Other Findings   Reproductive/Obstetrics negative OB ROS                          Anesthesia Physical Anesthesia Plan  ASA: III  Anesthesia Plan: General   Post-op Pain Management:    Induction: Intravenous  Airway Management Planned: LMA  Additional Equipment:   Intra-op Plan:   Post-operative Plan:   Informed Consent: I have reviewed the patients History and Physical, chart, labs and discussed the procedure including the risks, benefits and alternatives for the proposed anesthesia with the patient or authorized representative who has indicated his/her understanding and acceptance.   Dental Advisory Given  Plan Discussed with: CRNA and Surgeon  Anesthesia Plan Comments:         Anesthesia Quick Evaluation

## 2013-02-16 NOTE — Op Note (Signed)
Preoperative diagnosis: Urothelial carcinoma the bladder  Postoperative diagnosis: Same   Procedure: TURBT (repeat) large, greater than 5 cm bladder tumor   Surgeon: Bertram Millard. Natalio Salois, M.D.   Anesthesia: Gen.   Complications: Right sidewall perforation  Specimen(s): Bladder tumor  Drain(s): 22 French three-way Foley catheter to CBI  Indications: 62 year-old female returning to the operating room for repeat resection of a very large t tumor which originally encompassed her right bladder. She underwent resection last month, following diagnosis of a large bladder tumor upon evaluation for gross hematuria and hemorrhagic anemia. Resection was performed, subtotal, with pathology revealing high-grade but noninvasive urothelial carcinoma. Because of the large volume tumor, she presents at this time for second resection. She is aware of the procedure as well as risks and complications and desires to proceed.    Technique and findings: The patient was properly identified in the holding area. She was taken the operating room following administration of IV antibiotics. She was administered general anesthesia using the LMA and placed in the dorsolithotomy position. Genitalia and perineum were prepped and draped. Proper timeout was performed.  A 27 French resectoscope sheath was placed with the visual obturator. Inspection was made of the bladder. There were a couple of small tumors on the left bladder wall, especially at the left bladder neck anteriorly. There were approximately 6-7 urothelial/papillary lesions on the right bladder wall, the largest of which was approximately 4 cm in size. Several were 2-3 cm. There was a urothelial tumor noted in a bladder diverticulum located to the right of the midline on the posterior bladder which was not identified previously. I easily identified the left ureteral orifice. The right ureteral orifice was obscured by bladder tumor. I placed the resectoscope element  and the cutting loop. The left bladder neck lesions located anteriorly were first resected. There were 2-3 small lesions on the left bladder wall posteriorly which were then resected. I then turned my attention to the right bladder. I first resected the tumors in the midline posteriorly, then the lesions over the right trigonal region. I never did see the right ureteral orifice during these resections. I then at this point identified the previously mentioned bladder diverticulum that had 2-3 papillary lesions within it. I scraped these of without trying to resect them, as I know that this was then wall. I then cauterized the area of resection. The diverticulum head and neck approximately 1/2 cm in size. Some tumor was resected around the neck of the diverticulum as well. I then resected the largest tumor on the right posterior lateral bladder. This was the largest one, about 4 cm in size. There was a confluence of 2-3 tumors in a linear distribution inferior and lateral to this. Resection was somewhat difficult, as it was right over the obturator nerve and there was a reflex produced. Because of this reflex, there was a small perforation. At this point, I felt that this precluded use of mitomycin. Resection lasted approximately one hour. Following this, the tumor burden was irrigated from the bladder with a Careers information officer. All bladder tumor sites were coagulated. I saw no further papillary lesions at this point. There was no active bleeding. At this point, the scope was removed and I placed a 22 [place] three-way late catheter, silicone, and hooked to CBI.  The patient tolerated the procedure well. She was awakened and taken to PACU in stable condition. She will be kept overnight for observation, and more likely sent home the next day with the  catheter in place.

## 2013-02-16 NOTE — Anesthesia Postprocedure Evaluation (Signed)
Anesthesia Post Note  Patient: Savannah Stanley  Procedure(s) Performed: Procedure(s) (LRB): TRANSURETHRAL RESECTION OF BLADDER TUMOR (TURBT) (N/A)  Anesthesia type: General  Patient location: PACU  Post pain: Pain level controlled  Post assessment: Post-op Vital signs reviewed  Last Vitals: BP 130/79  Pulse 83  Temp(Src) 36.6 C (Oral)  Resp 13  Ht 5\' 3"  (1.6 m)  Wt 197 lb 8 oz (89.585 kg)  BMI 34.99 kg/m2  SpO2 95%  Post vital signs: Reviewed  Level of consciousness: sedated  Complications: No apparent anesthesia complications

## 2013-02-16 NOTE — Anesthesia Procedure Notes (Signed)
Procedure Name: LMA Insertion Date/Time: 02/16/2013 1:51 PM Performed by: Jessica Priest Pre-anesthesia Checklist: Patient identified, Emergency Drugs available, Suction available and Patient being monitored Patient Re-evaluated:Patient Re-evaluated prior to inductionOxygen Delivery Method: Circle System Utilized Preoxygenation: Pre-oxygenation with 100% oxygen Intubation Type: IV induction Ventilation: Mask ventilation without difficulty LMA: LMA inserted LMA Size: 4.0 Number of attempts: 1 Airway Equipment and Method: bite block Placement Confirmation: positive ETCO2 Tube secured with: Tape Dental Injury: Teeth and Oropharynx as per pre-operative assessment

## 2013-02-17 ENCOUNTER — Encounter (HOSPITAL_BASED_OUTPATIENT_CLINIC_OR_DEPARTMENT_OTHER): Payer: Self-pay | Admitting: Urology

## 2013-02-17 NOTE — Progress Notes (Signed)
Pt discharged home via family; Pt and family given and explained all discharge instructions, carenotes, and prescriptions; pt and family stated understanding and denied questions/concerns; all f/u appointments in place; IV removed without complicaitons; pt stable at time of discharge   Inserted port in CBI per MD order; instructed pt and family on use of lag and how to interchange with larger drainage bag; returned demostration

## 2013-02-21 ENCOUNTER — Encounter: Payer: 59 | Admitting: Family Medicine

## 2013-02-28 ENCOUNTER — Ambulatory Visit: Payer: 59 | Admitting: Internal Medicine

## 2013-03-06 NOTE — Discharge Summary (Signed)
Patient ID: Savannah Stanley MRN: [id number] DOB/AGE: [birth year] 62 y.o.  Admit date: 01/18/2013 Discharge date: 03/06/2013  Primary Care Physician:  Danise Edge, MD  Discharge Diagnoses:   Present on Admission:  . Anemia . COPD (chronic obstructive pulmonary disease) . Diabetes mellitus type 2 in obese . Depression . Hypertension . Hyperlipidemia . Esophageal reflux . Hematuria, gross . E. coli UTI . Hypokalemia . Weakness . Tachycardia  Consults:  Hospital service   Discharge Medications:   Medication List    STOP taking these medications       losartan 50 MG tablet  Commonly known as:  COZAAR      TAKE these medications       citalopram 40 MG tablet  Commonly known as:  CELEXA  Take 40 mg by mouth daily.     FEOSOL 325 (65 FE) MG tablet  Generic drug:  ferrous sulfate  Take 325 mg by mouth daily with breakfast.     metFORMIN 500 MG tablet  Commonly known as:  GLUCOPHAGE  Take 500 mg by mouth daily with breakfast.     omeprazole 20 MG capsule  Commonly known as:  PRILOSEC  Take 1 capsule by mouth two times daily     oxybutynin 5 MG tablet  Commonly known as:  DITROPAN  Take 1 tablet (5 mg total) by mouth every 8 (eight) hours as needed for bladder spasms.         Significant Diagnostic Studies:  No results found.  Brief H and P: For complete details please refer to admission H and P, but in brief the patient was admitted due to significant anemia and a bladder mass.  Hospital Course:  Principal Problem:   Hematuria, gross Active Problems:   COPD (chronic obstructive pulmonary disease)   Diabetes mellitus type 2 in obese   Anemia   Depression   Hypertension   Hyperlipidemia   Esophageal reflux   E. coli UTI   Hypokalemia   Weakness   Tachycardia   Urethral carcinoma  The patient was admitted to the hospital service. I was asked to consult due to hematuria and probable bladder mass. Evaluation revealed this to be a urothelial  carcinoma. She received significant number of transfusions which adequately replaced her blood volume. She also underwent a TURBT. This was incomplete, as she had a huge volume of tumor. This did adequately stop her bleeding, however. She was discharged home couple of days after her TURBT, with the catheter left indwelling. Day of Discharge BP 134/44  Pulse 85  Temp(Src) 97.6 F (36.4 C) (Oral)  Resp 20  Ht 5\' 3"  (1.6 m)  Wt 97.8 kg (215 lb 9.8 oz)  BMI 38.20 kg/m2  SpO2 97%  No results found for this or any previous visit (from the past 24 hour(s)).  Physical Exam: General: Alert and awake oriented x3 not in any acute distress. HEENT: anicteric sclera, pupils reactive to light and accommodation CVS: S1-S2 clear no murmur rubs or gallops Chest: clear to auscultation bilaterally, no wheezing rales or rhonchi Abdomen: soft nontender, nondistended, normal bowel sounds, no organomegaly Extremities: no cyanosis, clubbing or edema noted bilaterally Neuro: Cranial nerves II-XII intact, no focal neurological deficits  Disposition:  The patient was in  Diet:  No restrictions  Activity:  Gradually increase   Disposition and Follow-up:     Discharge Orders   Future Appointments Provider Department Dept Phone   03/27/2013 9:15 AM Bradd Canary, MD  HealthCare at  [facility]  [phone]   Future Orders Complete By Expires   Diet - low sodium heart healthy  As directed    Discharge instructions  As directed    Comments:     Follow up with PCP in one week. Please check Basic metabolic panel in one week.  Follow upwith Dr Lynnae Sandhoff as recommended.         TESTS THAT NEED FOLLOW-UP  Pathology review  DISCHARGE FOLLOW-UP Follow-up Information   Follow up with Beverley Fiedler, MD On 02/28/2013. (at 10 am....Marland Kitchento discuss colonoscopyand EGD)    Specialty:  Gastroenterology   Contact information:   [address] [place] [address] [phone]       Follow up with  [name], MD. (we'll call you)    Specialty:  Urology   Contact information:   [address] [place] [address] [phone]       Follow up with Danise Edge, MD In 1 week. (check BMP 1 week.)    Specialty:  Family Medicine   Contact information:   [address] [place] [address] [phone]       Time spent on Discharge:  15 minutes  Signed: [name] 03/06/2013, 9:14 AM

## 2013-03-06 NOTE — Discharge Summary (Signed)
Patient ID: Savannah Stanley MRN: [id number] DOB/AGE: [birth year] 62 y.o.  Admit date: 02/16/2013 Discharge date: 03/06/2013  Primary Care Physician:  Danise Edge, MD  Discharge Diagnoses:   Present on Admission:  . Malignant neoplasm of bladder, part unspecified  Consults:  None   Discharge Medications:   Medication List         citalopram 40 MG tablet  Commonly known as:  CELEXA  Take 40 mg by mouth daily.     CITRACAL + D PO  Take 2 tablets by mouth 3 (three) times daily.     FEOSOL 325 (65 FE) MG tablet  Generic drug:  ferrous sulfate  Take 325 mg by mouth daily with breakfast.     lactobacillus acidophilus Tabs tablet  Take 2 tablets by mouth daily.     losartan 50 MG tablet  Commonly known as:  COZAAR  Take 1 tablet (50 mg total) by mouth daily.---  TAKES IN MORNING     metFORMIN 500 MG tablet  Commonly known as:  GLUCOPHAGE  Take 500 mg by mouth daily with breakfast.     omeprazole 20 MG capsule  Commonly known as:  PRILOSEC  Take 1 capsule by mouth two times daily     oxybutynin 5 MG tablet  Commonly known as:  DITROPAN  Take 1 tablet (5 mg total) by mouth every 8 (eight) hours as needed for bladder spasms.     oxyCODONE-acetaminophen 5-325 MG per tablet  Commonly known as:  ROXICET  Take 1 tablet by mouth every 4 (four) hours as needed for severe pain.     sulfamethoxazole-trimethoprim 800-160 MG per tablet  Commonly known as:  BACTRIM DS  Take 1 tablet by mouth 2 (two) times daily.         Significant Diagnostic Studies:  No results found.  Brief H and P: For complete details please refer to admission H and P, but in brief the patient is admitted for repeat TURBT for large incompletely resected bladder tumor from her recent hospitalization.  Hospital Course:  Active Problems:   Malignant neoplasm of bladder, part unspecified  The patient was admitted directly from the operating room following TURBT. She tolerated the procedure well.  Because of a right bladder neck perforation, mitomycin was not left in the patient's bladder following the procedure. She did well following the procedure and was discharged on postoperative day #1. Day of Discharge BP 119/65  Pulse 85  Temp(Src) 97.8 F (36.6 C) (Oral)  Resp 16  Ht 5\' 3"  (1.6 m)  Wt 89.585 kg (197 lb 8 oz)  BMI 34.99 kg/m2  SpO2 98%  No results found for this or any previous visit (from the past 24 hour(s)).  Physical Exam: General: Alert and awake oriented x3 not in any acute distress. HEENT: anicteric sclera, pupils reactive to light and accommodation CVS: S1-S2 clear no murmur rubs or gallops Chest: clear to auscultation bilaterally, no wheezing rales or rhonchi Abdomen: soft nontender, nondistended, normal bowel sounds, no organomegaly Extremities: no cyanosis, clubbing or edema noted bilaterally Neuro: Cranial nerves II-XII intact, no focal neurological deficits  Disposition:  Home  Diet:  Per usual  Activity:  Gradually increase   Disposition and Follow-up:     Discharge Orders   Future Appointments Provider Department Dept Phone   03/27/2013 9:15 AM Bradd Canary, MD [facility] at  [facility] [phone]   Future Orders Complete By Expires   Care order/instruction  As directed    Scheduling  Instructions:     Send home w/ leg, bedside bags Plug irrigation port   Discharge patient  As directed          TESTS THAT NEED FOLLOW-UP  Pathology review  DISCHARGE FOLLOW-UP Follow-up Information   Follow up with [name], MD. (we'll call you)    Specialty:  Urology   Contact information:   [address] [place] [address] [phone]       Time spent on Discharge:  15 minutes  Signed: [name] 03/06/2013, 9:17 AM

## 2013-03-27 ENCOUNTER — Encounter: Payer: Self-pay | Admitting: Family Medicine

## 2013-03-27 ENCOUNTER — Ambulatory Visit (INDEPENDENT_AMBULATORY_CARE_PROVIDER_SITE_OTHER): Payer: 59 | Admitting: Family Medicine

## 2013-03-27 ENCOUNTER — Telehealth: Payer: Self-pay | Admitting: Family Medicine

## 2013-03-27 VITALS — BP 122/80 | HR 94 | Temp 98.5°F | Ht 63.0 in | Wt 202.0 lb

## 2013-03-27 DIAGNOSIS — C680 Malignant neoplasm of urethra: Secondary | ICD-10-CM

## 2013-03-27 DIAGNOSIS — E1169 Type 2 diabetes mellitus with other specified complication: Secondary | ICD-10-CM

## 2013-03-27 DIAGNOSIS — E119 Type 2 diabetes mellitus without complications: Secondary | ICD-10-CM

## 2013-03-27 DIAGNOSIS — F3289 Other specified depressive episodes: Secondary | ICD-10-CM

## 2013-03-27 DIAGNOSIS — F32A Depression, unspecified: Secondary | ICD-10-CM

## 2013-03-27 DIAGNOSIS — F329 Major depressive disorder, single episode, unspecified: Secondary | ICD-10-CM

## 2013-03-27 DIAGNOSIS — D649 Anemia, unspecified: Secondary | ICD-10-CM

## 2013-03-27 DIAGNOSIS — K219 Gastro-esophageal reflux disease without esophagitis: Secondary | ICD-10-CM

## 2013-03-27 DIAGNOSIS — E669 Obesity, unspecified: Principal | ICD-10-CM

## 2013-03-27 DIAGNOSIS — E785 Hyperlipidemia, unspecified: Secondary | ICD-10-CM

## 2013-03-27 DIAGNOSIS — R000 Tachycardia, unspecified: Secondary | ICD-10-CM

## 2013-03-27 DIAGNOSIS — I10 Essential (primary) hypertension: Secondary | ICD-10-CM

## 2013-03-27 NOTE — Progress Notes (Signed)
Pre visit review using our clinic review tool, if applicable. No additional management support is needed unless otherwise documented below in the visit note. 

## 2013-03-27 NOTE — Telephone Encounter (Signed)
LAB ORDER WEEK OF 06-15-2013 Labs at or prior to 3 month visit Lipid, rena. Cbc, tsh, hepatic, hgba1c

## 2013-03-27 NOTE — Patient Instructions (Signed)
Zinc 50 mg daily, calcium/magnesium/zinc combo at bedtime Calcium with vitamin d in am if above  Nonspecific Tachycardia Tachycardia is a faster than normal heartbeat (more than 100 beats per minute). In adults, the heart normally beats between 60 and 100 times a minute. A fast heartbeat may be a normal response to exercise or stress. It does not necessarily mean that something is wrong. However, sometimes when your heart beats too fast it may not be able to pump enough blood to the rest of your body. This can result in chest pain, shortness of breath, dizziness, and even fainting. Nonspecific tachycardia means that the specific cause or pattern of your tachycardia is unknown. CAUSES  Tachycardia may be harmless or it may be due to a more serious underlying cause. Possible causes of tachycardia include:  Exercise or exertion.  Fever.  Pain or injury.  Infection.  Loss of body fluids (dehydration).  Overactive thyroid.  Lack of red blood cells (anemia).  Anxiety and stress.  Alcohol.  Caffeine.  Tobacco products.  Diet pills.  Illegal drugs.  Heart disease. SYMPTOMS  Rapid or irregular heartbeat (palpitations).  Suddenly feeling your heart beating (cardiac awareness).  Dizziness.  Tiredness (fatigue).  Shortness of breath.  Chest pain.  Nausea.  Fainting. DIAGNOSIS  Your caregiver will perform a physical exam and take your medical history. In some cases, a heart specialist (cardiologist) may be consulted. Your caregiver may also order:  Blood tests.  Electrocardiography. This test records the electrical activity of your heart.  A heart monitoring test. TREATMENT  Treatment will depend on the likely cause of your tachycardia. The goal is to treat the underlying cause of your tachycardia. Treatment methods may include:  Replacement of fluids or blood through an intravenous (IV) tube for moderate to severe dehydration or anemia.  New medicines or changes  in your current medicines.  Diet and lifestyle changes.  Treatment for certain infections.  Stress relief or relaxation methods. HOME CARE INSTRUCTIONS   Rest.  Drink enough fluids to keep your urine clear or pale yellow.  Do not smoke.  Avoid:  Caffeine.  Tobacco.  Alcohol.  Chocolate.  Stimulants such as over-the-counter diet pills or pills that help you stay awake.  Situations that cause anxiety or stress.  Illegal drugs such as marijuana, phencyclidine (PCP), and cocaine.  Only take medicine as directed by your caregiver.  Keep all follow-up appointments as directed by your caregiver. SEEK IMMEDIATE MEDICAL CARE IF:   You have pain in your chest, upper arms, jaw, or neck.  You become weak, dizzy, or feel faint.  You have palpitations that will not go away.  You vomit, have diarrhea, or pass blood in your stool.  Your skin is cool, pale, and wet.  You have a fever that will not go away with rest, fluids, and medicine. MAKE SURE YOU:   Understand these instructions.  Will watch your condition.  Will get help right away if you are not doing well or get worse. Document Released: 04/09/2004 Document Revised: 05/25/2011 Document Reviewed: 02/10/2011 [facility] Patient Information 2014 [place], [place].

## 2013-03-27 NOTE — Progress Notes (Signed)
Patient ID: Savannah Stanley, female   DOB: [birth year], 63 y.o.   MRN: [id number] Savannah Stanley [id number] Aug 17, 1950 03/27/2013      Progress Note-Follow Up  Subjective  Chief Complaint  Chief Complaint  Patient presents with  . Follow-up    2 month    HPI  Patient is a 63 year old female who is in today for followup. She is tolerating her bladder cancer treatments well and offers no GU complaints today. Is followed closely with urology. No other recent illness. Declines mammogram and has since 2012. Denies headache, fevers, chest pain, palpitations, shortness or breath, GI concerns today. He's taking medications as prescribed.  Past Medical History  Diagnosis Date  . Hypertension   . GERD (gastroesophageal reflux disease)   . H/O hiatal hernia   . Type 2 diabetes mellitus   . Iron deficiency anemia   . Bladder cancer UROLOGIST-- DR Diona Fanti    HIGH-GRADE UROTHELIAL CARCINOMA  . Depression   . COPD (chronic obstructive pulmonary disease)     Past Surgical History  Procedure Laterality Date  . Transurethral resection of bladder tumor with gyrus (turbt-gyrus) N/A 01/23/2013    Procedure: TRANSURETHRAL RESECTION OF BLADDER TUMOR WITH GYRUS ;  Surgeon: Franchot Gallo, MD;  Location: [facility];  Service: Urology;  Laterality: N/A;  . Appendectomy  1980'S  . Tubal ligation  1980'S  . Transurethral resection of bladder tumor N/A 02/16/2013    Procedure: TRANSURETHRAL RESECTION OF BLADDER TUMOR (TURBT);  Surgeon: Franchot Gallo, MD;  Location: [facility];  Service: Urology;  Laterality: N/A;    Family History  Problem Relation Age of Onset  . Cancer Father     prostate    History   Social History  . Marital Status: Widowed    Spouse Name: N/A    Number of Children: N/A  . Years of Education: N/A   Occupational History  . Not on file.   Social History Main Topics  . Smoking status: Former Smoker -- 1.00 packs/day for 35 years    Types: Cigarettes     Quit date: 07/15/2010  . Smokeless tobacco: Never Used  . Alcohol Use: No  . Drug Use: No  . Sexual Activity: No   Other Topics Concern  . Not on file   Social History Narrative  . No narrative on file    Current Outpatient Prescriptions on File Prior to Visit  Medication Sig Dispense Refill  . Calcium Citrate-Vitamin D (CITRACAL + D PO) Take 2 tablets by mouth 3 (three) times daily.      . citalopram (CELEXA) 40 MG tablet Take 40 mg by mouth daily.      . ferrous sulfate (FEOSOL) 325 (65 FE) MG tablet Take 325 mg by mouth daily with breakfast.       . losartan (COZAAR) 50 MG tablet Take 1 tablet (50 mg total) by mouth daily.---  TAKES IN MORNING      . metFORMIN (GLUCOPHAGE) 500 MG tablet Take 500 mg by mouth daily with breakfast.      . omeprazole (PRILOSEC) 20 MG capsule Take 1 capsule by mouth two times daily  180 capsule  0  . oxybutynin (DITROPAN) 5 MG tablet Take 1 tablet (5 mg total) by mouth every 8 (eight) hours as needed for bladder spasms.  30 tablet  1   Current Facility-Administered Medications on File Prior to Visit  Medication Dose Route Frequency Provider Last Rate Last Dose  . mitoMYcin (MUTAMYCIN) chemo injection 40  mg  40 mg Bladder Instillation Once Franchot Gallo, MD        Allergies  Allergen Reactions  . Bee Venom Shortness Of Breath  . Other Shortness Of Breath    MSG.  . Latex Itching    Review of Systems  Review of Systems  Constitutional: Negative for fever and malaise/fatigue.  HENT: Negative for congestion.   Eyes: Negative for discharge.  Respiratory: Negative for shortness of breath.   Cardiovascular: Negative for chest pain, palpitations and leg swelling.  Gastrointestinal: Negative for nausea, abdominal pain and diarrhea.  Genitourinary: Negative for dysuria.  Musculoskeletal: Negative for falls.  Skin: Negative for rash.  Neurological: Negative for loss of consciousness and headaches.  Endo/Heme/Allergies: Negative for  polydipsia.  Psychiatric/Behavioral: Negative for depression and suicidal ideas. The patient is not nervous/anxious and does not have insomnia.     Objective  BP 122/80  Pulse 113  Temp(Src) 98.5 F (36.9 C) (Oral)  Ht 5\' 3"  (1.6 m)  Wt 202 lb (91.627 kg)  BMI 35.79 kg/m2  SpO2 96%  Physical Exam  Physical Exam  Constitutional: She is oriented to person, place, and time and well-developed, well-nourished, and in no distress. No distress.  HENT:  Head: Normocephalic and atraumatic.  Eyes: Conjunctivae are normal.  Neck: Neck supple. No thyromegaly present.  Cardiovascular: Normal rate, regular rhythm and normal heart sounds.   No murmur heard. Pulmonary/Chest: Effort normal and breath sounds normal. She has no wheezes.  Abdominal: She exhibits no distension and no mass.  Musculoskeletal: She exhibits no edema.  Lymphadenopathy:    She has no cervical adenopathy.  Neurological: She is alert and oriented to person, place, and time.  Skin: Skin is warm and dry. No rash noted. She is not diaphoretic.  Psychiatric: Memory, affect and judgment normal.    Lab Results  Component Value Date   TSH 0.866 08/19/2012   Lab Results  Component Value Date   WBC 4.1 02/01/2013   HGB 12.9 02/16/2013   HCT 38.0 02/16/2013   MCV 91.9 02/01/2013   PLT 282 02/01/2013   Lab Results  Component Value Date   CREATININE 0.58 02/10/2013   BUN 14 02/10/2013   NA 137 02/16/2013   K 3.7 02/16/2013   CL 102 02/10/2013   CO2 27 02/10/2013   Lab Results  Component Value Date   ALT 10 02/01/2013   AST 16 02/01/2013   ALKPHOS 52 02/01/2013   BILITOT 0.3 02/01/2013   Lab Results  Component Value Date   CHOL 198 02/01/2013   Lab Results  Component Value Date   HDL 63 02/01/2013   Lab Results  Component Value Date   LDLCALC 113* 02/01/2013   Lab Results  Component Value Date   TRIG 110 02/01/2013   Lab Results  Component Value Date   CHOLHDL 3.1 02/01/2013     Assessment &  Plan  Urethral carcinoma Is doing well continues to follow with urology and has no new concerns.  Diabetes mellitus type 2 in obese Well controlled, encouraged to minimize simple carbs.   Esophageal reflux Well controlled with dietary changes and Omeprzole prn may continue the same.  Tachycardia Improved some with recheck does admit to several cups of coffee this am. Encouraged to cut back on caffeine and sodium. Recheck at next visit.  Depression Good response to Citalopram. No changes.

## 2013-03-31 NOTE — Telephone Encounter (Signed)
Lab order placed.

## 2013-04-02 ENCOUNTER — Encounter: Payer: Self-pay | Admitting: Family Medicine

## 2013-04-02 NOTE — Assessment & Plan Note (Signed)
Improved some with recheck does admit to several cups of coffee this am. Encouraged to cut back on caffeine and sodium. Recheck at next visit.

## 2013-04-02 NOTE — Assessment & Plan Note (Signed)
Well controlled, encouraged to minimize simple carbs.

## 2013-04-02 NOTE — Assessment & Plan Note (Signed)
Is doing well continues to follow with urology and has no new concerns.

## 2013-04-02 NOTE — Assessment & Plan Note (Signed)
Good response to Citalopram. No changes.

## 2013-04-02 NOTE — Assessment & Plan Note (Signed)
Well controlled with dietary changes and Omeprzole prn may continue the same.

## 2013-04-15 ENCOUNTER — Other Ambulatory Visit: Payer: Self-pay | Admitting: Family Medicine

## 2013-04-18 ENCOUNTER — Ambulatory Visit (INDEPENDENT_AMBULATORY_CARE_PROVIDER_SITE_OTHER): Payer: 59 | Admitting: Family Medicine

## 2013-04-18 VITALS — BP 124/82 | HR 102

## 2013-04-18 DIAGNOSIS — I10 Essential (primary) hypertension: Secondary | ICD-10-CM

## 2013-04-18 NOTE — Progress Notes (Deleted)
   Subjective:    Patient ID: Savannah Stanley, female    DOB: [birth year], 63 y.o.   MRN: [id number]  HPI    Review of Systems     Objective:   Physical Exam        Assessment & Plan:  Patient came in today for her BP to be checked

## 2013-04-19 ENCOUNTER — Telehealth: Payer: Self-pay | Admitting: Family Medicine

## 2013-04-19 NOTE — Telephone Encounter (Signed)
Relevant patient education assigned to patient using Emmi. ° °

## 2013-04-19 NOTE — Progress Notes (Signed)
Patient ID: Savannah Stanley, female   DOB: [birth year], 63 y.o.   MRN: [id number] Patient underwent BP check with CMA today

## 2013-05-22 NOTE — Discharge Summary (Signed)
Patient ID: KAMERA DUBAS MRN: [id number] DOB/AGE: 65-Nov-1952 65 y.o.  Admit date: 01/18/2013 Discharge date: 01/26/2013  Primary Care Physician:  Penni Homans, MD  Discharge Diagnoses:   Present on Admission:  . Anemia . COPD (chronic obstructive pulmonary disease) . Diabetes mellitus type 2 in obese . Depression . Hypertension . Hyperlipidemia . Esophageal reflux . Hematuria, gross . E. coli UTI . Hypokalemia . Weakness . Tachycardia  Consults:  Hospitalist service   Discharge Medications:   Medication List    STOP taking these medications       losartan 50 MG tablet  Commonly known as:  COZAAR      TAKE these medications       FEOSOL 325 (65 FE) MG tablet  Generic drug:  ferrous sulfate  Take 325 mg by mouth daily with breakfast.     metFORMIN 500 MG tablet  Commonly known as:  GLUCOPHAGE  Take 500 mg by mouth daily with breakfast.     omeprazole 20 MG capsule  Commonly known as:  PRILOSEC  Take 1 capsule by mouth two times daily     oxybutynin 5 MG tablet  Commonly known as:  DITROPAN  Take 1 tablet (5 mg total) by mouth every 8 (eight) hours as needed for bladder spasms.         Significant Diagnostic Studies:  No results found.  Brief H and P: For complete details please refer to admission H and P, but in brief the patient is admitted for significant hematuria and a bladder mass seen on CT scan.  Hospital Course:  Principal Problem:   Hematuria, gross Active Problems:   COPD (chronic obstructive pulmonary disease)   Diabetes mellitus type 2 in obese   Anemia   Depression   Hypertension   Hyperlipidemia   Esophageal reflux   E. coli UTI   Hypokalemia   Weakness   Tachycardia   Urethral carcinoma   Day of Discharge BP 134/44  Pulse 85  Temp(Src) 97.6 F (36.4 C) (Oral)  Resp 20  Ht 5\' 3"  (1.6 m)  Wt 97.8 kg (215 lb 9.8 oz)  BMI 38.20 kg/m2  SpO2 97%  No results found for this or any previous visit (from the past 24  hour(s)).  Physical Exam: General: Alert and awake oriented x3 not in any acute distress. HEENT: anicteric sclera, pupils reactive to light and accommodation CVS: S1-S2 clear no murmur rubs or gallops Chest: clear to auscultation bilaterally, no wheezing rales or rhonchi Abdomen: soft nontender, nondistended, normal bowel sounds, no organomegaly Extremities: no cyanosis, clubbing or edema noted bilaterally Neuro: Cranial nerves II-XII intact, no focal neurological deficits  Disposition:  Home  Diet:  No restrictions  Activity:   Gradually increase   Disposition and Follow-up:     Discharge Orders   Future Appointments Provider Department Dept Phone   06/27/2013 10:15 AM [name], MD [place] at  [facility] [phone]   Future Orders Complete By Expires   Diet - low sodium heart healthy  As directed    Discharge instructions  As directed    Comments:     Follow up with PCP in one week. Please check Basic metabolic panel in one week.  Follow upwith Dr Dorina Hoyer as recommended.         TESTS THAT NEED FOLLOW-UP  Pathology has been reviewed  DISCHARGE FOLLOW-UP Follow-up Information   Follow up with Jerene Bears, MD On 02/28/2013. (at 10 am....Marland Kitchento discuss colonoscopyand EGD)  Specialty:  Gastroenterology   Contact information:   [address] [place] [address] [phone]       Follow up with Jorja Loa, MD. (we'll call you)    Specialty:  Urology   Contact information:   [place] [address] [phone]       Follow up with Penni Homans, MD In 1 week. (check BMP 1 week.)    Specialty:  Family Medicine   Contact information:   [place] [address] [phone]       Time spent on Discharge:  20 minutes  Signed: Jorja Loa 05/22/2013, 1:15 PM

## 2013-05-30 ENCOUNTER — Other Ambulatory Visit: Payer: Self-pay | Admitting: Internal Medicine

## 2013-05-30 NOTE — Telephone Encounter (Signed)
I do not see that we have sent Advair refill since 2013?  Please advise.

## 2013-05-30 NOTE — Telephone Encounter (Signed)
Please advise 

## 2013-06-20 ENCOUNTER — Encounter: Payer: Self-pay | Admitting: Family Medicine

## 2013-06-20 MED ORDER — OMEPRAZOLE 20 MG PO CPDR: DELAYED_RELEASE_CAPSULE | ORAL | Status: DC

## 2013-06-22 LAB — HEPATIC FUNCTION PANEL
ALBUMIN: 4.3 g/dL (ref 3.5–5.2)
ALT: 12 U/L (ref 0–35)
AST: 14 U/L (ref 0–37)
Alkaline Phosphatase: 46 U/L (ref 39–117)
BILIRUBIN INDIRECT: 0.4 mg/dL (ref 0.2–1.2)
Bilirubin, Direct: 0.1 mg/dL (ref 0.0–0.3)
TOTAL PROTEIN: 6.4 g/dL (ref 6.0–8.3)
Total Bilirubin: 0.5 mg/dL (ref 0.2–1.2)

## 2013-06-22 LAB — CBC
HCT: 39.4 % (ref 36.0–46.0)
HEMOGLOBIN: 13.9 g/dL (ref 12.0–15.0)
MCH: 30.3 pg (ref 26.0–34.0)
MCHC: 35.3 g/dL (ref 30.0–36.0)
MCV: 85.8 fL (ref 78.0–100.0)
Platelets: 206 10*3/uL (ref 150–400)
RBC: 4.59 MIL/uL (ref 3.87–5.11)
RDW: 14.6 % (ref 11.5–15.5)
WBC: 6.4 10*3/uL (ref 4.0–10.5)

## 2013-06-22 LAB — TSH: TSH: 0.862 u[IU]/mL (ref 0.350–4.500)

## 2013-06-22 LAB — RENAL FUNCTION PANEL
Albumin: 4.3 g/dL (ref 3.5–5.2)
BUN: 16 mg/dL (ref 6–23)
CHLORIDE: 99 meq/L (ref 96–112)
CO2: 27 meq/L (ref 19–32)
Calcium: 9.4 mg/dL (ref 8.4–10.5)
Creat: 0.58 mg/dL (ref 0.50–1.10)
Glucose, Bld: 123 mg/dL — ABNORMAL HIGH (ref 70–99)
POTASSIUM: 4.2 meq/L (ref 3.5–5.3)
Phosphorus: 4 mg/dL (ref 2.3–4.6)
SODIUM: 135 meq/L (ref 135–145)

## 2013-06-22 LAB — HEMOGLOBIN A1C
HEMOGLOBIN A1C: 6.6 % — AB (ref <5.7)
Mean Plasma Glucose: 143 mg/dL — ABNORMAL HIGH (ref <117)

## 2013-06-22 LAB — LIPID PANEL
CHOL/HDL RATIO: 3.4 ratio
Cholesterol: 184 mg/dL (ref 0–200)
HDL: 54 mg/dL (ref >39)
LDL CALC: 102 mg/dL — AB (ref 0–99)
TRIGLYCERIDES: 139 mg/dL (ref <150)
VLDL: 28 mg/dL (ref 0–40)

## 2013-06-27 ENCOUNTER — Ambulatory Visit (INDEPENDENT_AMBULATORY_CARE_PROVIDER_SITE_OTHER): Payer: 59 | Admitting: Family Medicine

## 2013-06-27 ENCOUNTER — Encounter: Payer: Self-pay | Admitting: Family Medicine

## 2013-06-27 VITALS — BP 120/80 | HR 102 | Temp 98.7°F | Ht 63.0 in | Wt 207.1 lb

## 2013-06-27 DIAGNOSIS — G4700 Insomnia, unspecified: Secondary | ICD-10-CM

## 2013-06-27 DIAGNOSIS — I10 Essential (primary) hypertension: Secondary | ICD-10-CM

## 2013-06-27 DIAGNOSIS — E785 Hyperlipidemia, unspecified: Secondary | ICD-10-CM

## 2013-06-27 DIAGNOSIS — E119 Type 2 diabetes mellitus without complications: Secondary | ICD-10-CM

## 2013-06-27 DIAGNOSIS — R000 Tachycardia, unspecified: Secondary | ICD-10-CM

## 2013-06-27 DIAGNOSIS — E1169 Type 2 diabetes mellitus with other specified complication: Secondary | ICD-10-CM

## 2013-06-27 DIAGNOSIS — E669 Obesity, unspecified: Secondary | ICD-10-CM

## 2013-06-27 MED ORDER — METFORMIN HCL 500 MG PO TABS: 500.0000 mg | ORAL_TABLET | Freq: Two times a day (BID) | ORAL | Status: DC

## 2013-06-27 MED ORDER — PRAVASTATIN SODIUM 10 MG PO TABS: 10.0000 mg | ORAL_TABLET | Freq: Every day | ORAL | Status: DC

## 2013-06-27 NOTE — Patient Instructions (Signed)
Co Q 10 enzyme daily  Cholesterol Cholesterol is a white, waxy, fat-like protein needed by your body in small amounts. The liver makes all the cholesterol you need. It is carried from the liver by the blood through the blood vessels. Deposits (plaque) may build up on blood vessel walls. This makes the arteries narrower and stiffer. Plaque increases the risk for heart attack and stroke. You cannot feel your cholesterol level even if it is very high. The only way to know is by a blood test to check your lipid (fats) levels. Once you know your cholesterol levels, you should keep a record of the test results. Work with your caregiver to to keep your levels in the desired range. WHAT THE RESULTS MEAN:  Total cholesterol is a rough measure of all the cholesterol in your blood.  LDL is the so-called bad cholesterol. This is the type that deposits cholesterol in the walls of the arteries. You want this level to be low.  HDL is the good cholesterol because it cleans the arteries and carries the LDL away. You want this level to be high.  Triglycerides are fat that the body can either burn for energy or store. High levels are closely linked to heart disease. DESIRED LEVELS:  Total cholesterol below 200.  LDL below 100 for people at risk, below 70 for very high risk.  HDL above 50 is good, above 60 is best.  Triglycerides below 150. HOW TO LOWER YOUR CHOLESTEROL:  Diet.  Choose fish or white meat chicken and [place], roasted or baked. Limit fatty cuts of red meat, fried foods, and processed meats, such as sausage and lunch meat.  Eat lots of fresh fruits and vegetables. Choose whole grains, beans, pasta, potatoes and cereals.  Use only small amounts of olive, corn or canola oils. Avoid butter, mayonnaise, shortening or palm kernel oils. Avoid foods with trans-fats.  Use skim/nonfat milk and low-fat/nonfat yogurt and cheeses. Avoid whole milk, cream, ice cream, egg yolks and cheeses. Healthy  desserts include angel food cake, ginger snaps, animal crackers, hard candy, popsicles, and low-fat/nonfat frozen yogurt. Avoid pastries, cakes, pies and cookies.  Exercise.  A regular program helps decrease LDL and raises HDL.  Helps with weight control.  Do things that increase your activity level like gardening, walking, or taking the stairs.  Medication.  May be prescribed by your caregiver to help lowering cholesterol and the risk for heart disease.  You may need medicine even if your levels are normal if you have several risk factors. HOME CARE INSTRUCTIONS   Follow your diet and exercise programs as suggested by your caregiver.  Take medications as directed.  Have blood work done when your caregiver feels it is necessary. MAKE SURE YOU:   Understand these instructions.  Will watch your condition.  Will get help right away if you are not doing well or get worse. Document Released: 11/25/2000 Document Revised: 05/25/2011 Document Reviewed: 12/14/2012 [facility] Patient Information 2014 [place], [place].

## 2013-06-27 NOTE — Progress Notes (Signed)
Pre visit review using our clinic review tool, if applicable. No additional management support is needed unless otherwise documented below in the visit note. 

## 2013-07-02 NOTE — Assessment & Plan Note (Signed)
Well controlled, no changes to meds. Encouraged heart healthy diet such as the DASH diet and exercise as tolerated.  °

## 2013-07-02 NOTE — Assessment & Plan Note (Signed)
Improved on recheck.  

## 2013-07-02 NOTE — Assessment & Plan Note (Signed)
Declines referral for evaluation of possible sleep apnea

## 2013-07-02 NOTE — Assessment & Plan Note (Signed)
hgba1c acceptable, minimize simple carbs. Increase exercise as tolerated. Continue current meds 

## 2013-07-02 NOTE — Progress Notes (Signed)
Patient ID: Savannah Stanley, female   DOB: 08/09/1950, 63 y.o.   MRN: [id number] Savannah Stanley [id number] September 15, 1950 07/02/2013      Progress Note-Follow Up  Subjective  Chief Complaint  Chief Complaint  Patient presents with  . Follow-up    3 month    HPI  Patient is a 63 year old female in today for routine medical care. Continues to follow with urology Dr. doll stent and is doing well. Has declined sleep study. He is generally feeling well. He denies any new complaints. Denies CP/palp/SOB/HA/congestion/fevers/GI or GU c/o. Taking meds as prescribed  Past Medical History  Diagnosis Date  . Hypertension   . GERD (gastroesophageal reflux disease)   . H/O hiatal hernia   . Type 2 diabetes mellitus   . Iron deficiency anemia   . Bladder cancer UROLOGIST-- DR Diona Fanti    HIGH-GRADE UROTHELIAL CARCINOMA  . Depression   . COPD (chronic obstructive pulmonary disease)     Past Surgical History  Procedure Laterality Date  . Transurethral resection of bladder tumor with gyrus (turbt-gyrus) N/A 01/23/2013    Procedure: TRANSURETHRAL RESECTION OF BLADDER TUMOR WITH GYRUS ;  Surgeon: Franchot Gallo, MD;  Location: [facility];  Service: Urology;  Laterality: N/A;  . Appendectomy  1980'S  . Tubal ligation  1980'S  . Transurethral resection of bladder tumor N/A 02/16/2013    Procedure: TRANSURETHRAL RESECTION OF BLADDER TUMOR (TURBT);  Surgeon: Franchot Gallo, MD;  Location: [facility];  Service: Urology;  Laterality: N/A;    Family History  Problem Relation Age of Onset  . Cancer Father     prostate    History   Social History  . Marital Status: Widowed    Spouse Name: N/A    Number of Children: N/A  . Years of Education: N/A   Occupational History  . Not on file.   Social History Main Topics  . Smoking status: Former Smoker -- 1.00 packs/day for 35 years    Types: Cigarettes    Quit date: 07/15/2010  . Smokeless tobacco: Never Used  . Alcohol Use: No   . Drug Use: No  . Sexual Activity: No   Other Topics Concern  . Not on file   Social History Narrative  . No narrative on file    Current Outpatient Prescriptions on File Prior to Visit  Medication Sig Dispense Refill  . ADVAIR DISKUS 250-50 MCG/DOSE AEPB Inhale 1 puff into the  lungs every 12 hours  180 each  5  . Calcium Citrate-Vitamin D (CITRACAL + D PO) Take 2 tablets by mouth 3 (three) times daily.      . citalopram (CELEXA) 40 MG tablet TAKE 1 TABLET BY MOUTH DAILY  90 tablet  1  . ferrous sulfate (FEOSOL) 325 (65 FE) MG tablet Take 325 mg by mouth daily with breakfast.       . losartan (COZAAR) 50 MG tablet Take 1 tablet (50 mg total) by mouth daily.---  TAKES IN MORNING      . omeprazole (PRILOSEC) 20 MG capsule Take 1 capsule by mouth two times daily  180 capsule  1  . oxybutynin (DITROPAN) 5 MG tablet Take 1 tablet (5 mg total) by mouth every 8 (eight) hours as needed for bladder spasms.  30 tablet  1   Current Facility-Administered Medications on File Prior to Visit  Medication Dose Route Frequency Provider Last Rate Last Dose  . mitoMYcin (MUTAMYCIN) chemo injection 40 mg  40 mg Bladder  Instillation Once Franchot Gallo, MD        Allergies  Allergen Reactions  . Bee Venom Shortness Of Breath  . Other Shortness Of Breath    MSG.  . Latex Itching    Review of Systems  Review of Systems  Constitutional: Positive for malaise/fatigue. Negative for fever.  HENT: Negative for congestion.   Eyes: Negative for discharge.  Respiratory: Negative for shortness of breath.   Cardiovascular: Negative for chest pain, palpitations and leg swelling.  Gastrointestinal: Negative for nausea, abdominal pain and diarrhea.  Genitourinary: Negative for dysuria.  Musculoskeletal: Negative for falls.  Skin: Negative for rash.  Neurological: Negative for loss of consciousness and headaches.  Endo/Heme/Allergies: Negative for polydipsia.  Psychiatric/Behavioral: Negative for  depression and suicidal ideas. The patient is not nervous/anxious and does not have insomnia.     Objective  BP 120/80  Pulse 102  Temp(Src) 98.7 F (37.1 C) (Oral)  Ht 5\' 3"  (1.6 m)  Wt 207 lb 1.3 oz (93.931 kg)  BMI 36.69 kg/m2  SpO2 92%  Physical Exam  Physical Exam  Constitutional: She is oriented to person, place, and time and well-developed, well-nourished, and in no distress. No distress.  HENT:  Head: Normocephalic and atraumatic.  Eyes: Conjunctivae are normal.  Neck: Neck supple. No thyromegaly present.  Cardiovascular: Normal rate, regular rhythm and normal heart sounds.   No murmur heard. Pulmonary/Chest: Effort normal and breath sounds normal. She has no wheezes.  Abdominal: She exhibits no distension and no mass.  Musculoskeletal: She exhibits no edema.  Lymphadenopathy:    She has no cervical adenopathy.  Neurological: She is alert and oriented to person, place, and time.  Skin: Skin is warm and dry. No rash noted. She is not diaphoretic.  Psychiatric: Memory, affect and judgment normal.    Lab Results  Component Value Date   TSH 0.862 06/22/2013   Lab Results  Component Value Date   WBC 6.4 06/22/2013   HGB 13.9 06/22/2013   HCT 39.4 06/22/2013   MCV 85.8 06/22/2013   PLT 206 06/22/2013   Lab Results  Component Value Date   CREATININE 0.58 06/22/2013   BUN 16 06/22/2013   NA 135 06/22/2013   K 4.2 06/22/2013   CL 99 06/22/2013   CO2 27 06/22/2013   Lab Results  Component Value Date   ALT 12 06/22/2013   AST 14 06/22/2013   ALKPHOS 46 06/22/2013   BILITOT 0.5 06/22/2013   Lab Results  Component Value Date   CHOL 184 06/22/2013   Lab Results  Component Value Date   HDL 54 06/22/2013   Lab Results  Component Value Date   LDLCALC 102* 06/22/2013   Lab Results  Component Value Date   TRIG 139 06/22/2013   Lab Results  Component Value Date   CHOLHDL 3.4 06/22/2013     Assessment & Plan  Hypertension Well controlled, no changes to meds. Encouraged heart healthy  diet such as the DASH diet and exercise as tolerated.   Tachycardia Improved on recheck  Hyperlipidemia Tolerating statin, encouraged heart healthy diet, avoid trans fats, minimize simple carbs and saturated fats. Increase exercise as tolerated  Diabetes mellitus type 2 in obese hgba1c acceptable, minimize simple carbs. Increase exercise as tolerated. Continue current meds  Insomnia Declines referral for evaluation of possible sleep apnea

## 2013-07-02 NOTE — Assessment & Plan Note (Signed)
Tolerating statin, encouraged heart healthy diet, avoid trans fats, minimize simple carbs and saturated fats. Increase exercise as tolerated 

## 2013-07-11 ENCOUNTER — Telehealth: Payer: Self-pay

## 2013-07-11 NOTE — Telephone Encounter (Signed)
Relevant patient education assigned to patient using Emmi. ° °

## 2013-07-12 ENCOUNTER — Other Ambulatory Visit: Payer: Self-pay | Admitting: Family Medicine

## 2013-09-26 ENCOUNTER — Telehealth: Payer: Self-pay

## 2013-09-26 DIAGNOSIS — I10 Essential (primary) hypertension: Secondary | ICD-10-CM

## 2013-09-26 DIAGNOSIS — E785 Hyperlipidemia, unspecified: Secondary | ICD-10-CM

## 2013-09-26 DIAGNOSIS — E669 Obesity, unspecified: Principal | ICD-10-CM

## 2013-09-26 DIAGNOSIS — E1169 Type 2 diabetes mellitus with other specified complication: Secondary | ICD-10-CM

## 2013-09-26 NOTE — Telephone Encounter (Signed)
Diabetic Bundle- Patient informed that we need her to come in at her earliest convenience for labs to be drawn for A1C.   Pt voiced understanding and will come in sometime this week or next  Lab order placed

## 2013-09-28 DIAGNOSIS — E119 Type 2 diabetes mellitus without complications: Secondary | ICD-10-CM | POA: Diagnosis not present

## 2013-09-28 DIAGNOSIS — E669 Obesity, unspecified: Secondary | ICD-10-CM | POA: Diagnosis not present

## 2013-09-28 LAB — HEMOGLOBIN A1C
HEMOGLOBIN A1C: 6.7 % — AB (ref <5.7)
MEAN PLASMA GLUCOSE: 146 mg/dL — AB (ref <117)

## 2013-09-29 DIAGNOSIS — C679 Malignant neoplasm of bladder, unspecified: Secondary | ICD-10-CM | POA: Diagnosis not present

## 2013-10-04 ENCOUNTER — Other Ambulatory Visit: Payer: Self-pay | Admitting: Family Medicine

## 2013-10-09 ENCOUNTER — Other Ambulatory Visit: Payer: Self-pay | Admitting: Family Medicine

## 2013-10-14 ENCOUNTER — Other Ambulatory Visit: Payer: Self-pay | Admitting: Family Medicine

## 2013-10-19 ENCOUNTER — Other Ambulatory Visit: Payer: Self-pay | Admitting: Family Medicine

## 2013-10-27 ENCOUNTER — Ambulatory Visit: Payer: 59 | Admitting: Family Medicine

## 2013-11-09 ENCOUNTER — Telehealth: Payer: Self-pay | Admitting: Physician Assistant

## 2013-11-09 DIAGNOSIS — I10 Essential (primary) hypertension: Secondary | ICD-10-CM | POA: Diagnosis not present

## 2013-11-09 DIAGNOSIS — E785 Hyperlipidemia, unspecified: Secondary | ICD-10-CM

## 2013-11-09 LAB — BASIC METABOLIC PANEL
BUN: 14 mg/dL (ref 6–23)
CHLORIDE: 100 meq/L (ref 96–112)
CO2: 27 mEq/L (ref 19–32)
Calcium: 8.9 mg/dL (ref 8.4–10.5)
Creat: 0.66 mg/dL (ref 0.50–1.10)
Glucose, Bld: 215 mg/dL — ABNORMAL HIGH (ref 70–99)
POTASSIUM: 4.3 meq/L (ref 3.5–5.3)
SODIUM: 137 meq/L (ref 135–145)

## 2013-11-09 LAB — CBC
HEMATOCRIT: 40 % (ref 36.0–46.0)
HEMOGLOBIN: 13.6 g/dL (ref 12.0–15.0)
MCH: 30.4 pg (ref 26.0–34.0)
MCHC: 34 g/dL (ref 30.0–36.0)
MCV: 89.5 fL (ref 78.0–100.0)
Platelets: 209 10*3/uL (ref 150–400)
RBC: 4.47 MIL/uL (ref 3.87–5.11)
RDW: 13.3 % (ref 11.5–15.5)
WBC: 6.1 10*3/uL (ref 4.0–10.5)

## 2013-11-09 LAB — LIPID PANEL
CHOL/HDL RATIO: 2.6 ratio
Cholesterol: 148 mg/dL (ref 0–200)
HDL: 56 mg/dL (ref >39)
LDL Cholesterol: 68 mg/dL (ref 0–99)
Triglycerides: 118 mg/dL (ref <150)
VLDL: 24 mg/dL (ref 0–40)

## 2013-11-09 LAB — HEPATIC FUNCTION PANEL
ALBUMIN: 4.4 g/dL (ref 3.5–5.2)
ALK PHOS: 45 U/L (ref 39–117)
ALT: 13 U/L (ref 0–35)
AST: 16 U/L (ref 0–37)
Bilirubin, Direct: 0.1 mg/dL (ref 0.0–0.3)
Indirect Bilirubin: 0.4 mg/dL (ref 0.2–1.2)
Total Bilirubin: 0.5 mg/dL (ref 0.2–1.2)
Total Protein: 6.5 g/dL (ref 6.0–8.3)

## 2013-11-09 LAB — TSH: TSH: 0.663 u[IU]/mL (ref 0.350–4.500)

## 2013-11-09 NOTE — Telephone Encounter (Signed)
Orders placed in system

## 2013-11-10 ENCOUNTER — Telehealth: Payer: Self-pay | Admitting: Physician Assistant

## 2013-11-10 NOTE — Telephone Encounter (Signed)
Labs reviewed will review at visit.

## 2013-11-10 NOTE — Telephone Encounter (Signed)
Patient of yours, Mrs. Staron, presented to the lab for routine blood work.  Labs were ordered as future and could not be seen by [employer].  I replacd orders for you.  Results are in.  I am sending this to you so you can review the results.

## 2013-11-17 ENCOUNTER — Ambulatory Visit (INDEPENDENT_AMBULATORY_CARE_PROVIDER_SITE_OTHER): Payer: Medicare Other | Admitting: Family Medicine

## 2013-11-17 ENCOUNTER — Encounter: Payer: Self-pay | Admitting: Family Medicine

## 2013-11-17 VITALS — BP 154/90 | HR 94 | Temp 98.4°F | Ht 63.0 in | Wt 210.0 lb

## 2013-11-17 DIAGNOSIS — F3289 Other specified depressive episodes: Secondary | ICD-10-CM | POA: Diagnosis not present

## 2013-11-17 DIAGNOSIS — Z23 Encounter for immunization: Secondary | ICD-10-CM

## 2013-11-17 DIAGNOSIS — Z0000 Encounter for general adult medical examination without abnormal findings: Secondary | ICD-10-CM | POA: Insufficient documentation

## 2013-11-17 DIAGNOSIS — J449 Chronic obstructive pulmonary disease, unspecified: Secondary | ICD-10-CM | POA: Diagnosis not present

## 2013-11-17 DIAGNOSIS — F329 Major depressive disorder, single episode, unspecified: Secondary | ICD-10-CM | POA: Diagnosis not present

## 2013-11-17 DIAGNOSIS — E119 Type 2 diabetes mellitus without complications: Secondary | ICD-10-CM | POA: Diagnosis not present

## 2013-11-17 DIAGNOSIS — J4489 Other specified chronic obstructive pulmonary disease: Secondary | ICD-10-CM | POA: Diagnosis not present

## 2013-11-17 DIAGNOSIS — C679 Malignant neoplasm of bladder, unspecified: Secondary | ICD-10-CM

## 2013-11-17 DIAGNOSIS — I10 Essential (primary) hypertension: Secondary | ICD-10-CM

## 2013-11-17 DIAGNOSIS — R000 Tachycardia, unspecified: Secondary | ICD-10-CM

## 2013-11-17 DIAGNOSIS — K219 Gastro-esophageal reflux disease without esophagitis: Secondary | ICD-10-CM

## 2013-11-17 DIAGNOSIS — E1169 Type 2 diabetes mellitus with other specified complication: Secondary | ICD-10-CM

## 2013-11-17 DIAGNOSIS — E785 Hyperlipidemia, unspecified: Secondary | ICD-10-CM | POA: Diagnosis not present

## 2013-11-17 DIAGNOSIS — E669 Obesity, unspecified: Secondary | ICD-10-CM

## 2013-11-17 DIAGNOSIS — F32A Depression, unspecified: Secondary | ICD-10-CM

## 2013-11-17 MED ORDER — ALBUTEROL SULFATE HFA 108 (90 BASE) MCG/ACT IN AERS: 2.0000 | INHALATION_SPRAY | Freq: Four times a day (QID) | RESPIRATORY_TRACT | Status: DC | PRN

## 2013-11-17 MED ORDER — PNEUMOCOCCAL 13-VAL CONJ VACC IM SUSP: 0.5000 mL | Freq: Once | INTRAMUSCULAR | Status: DC

## 2013-11-17 NOTE — Progress Notes (Signed)
Patient ID: Savannah Stanley, female   DOB: 1950-12-04, 63 y.o.   MRN: [id number] TERE MCCONAUGHEY [id number] April 21, 1950 11/17/2013      Progress Note-Follow Up  Subjective  Chief Complaint  Chief Complaint  Patient presents with  . Annual Exam    medicare wellness  . Injections    flu and prevnar    HPI  Patient is a 63 year old female in today for routine medical care. In today for annual exam. Doing fairly well. Has not had any recent flare in her COPD or bronchitis. Is taking zinc, calcium daily. Has recently had a new cyst removed by Dr. and has tolerated that well. Did drink a good deal of caffeine just prior to her visit. Denies CP/palp/SOB/HA/congestion/fevers/GI or GU c/o. Taking meds as prescribed. Somewhat tearful over the loss of her husband. Has had another cyst removed from her bladder and is following closely with urology. No dysuria or hematuria noted  Past Medical History  Diagnosis Date  . Hypertension   . GERD (gastroesophageal reflux disease)   . H/O hiatal hernia   . Type 2 diabetes mellitus   . Iron deficiency anemia   . Bladder cancer UROLOGIST-- DR Diona Fanti    HIGH-GRADE UROTHELIAL CARCINOMA  . Depression   . COPD (chronic obstructive pulmonary disease)     Past Surgical History  Procedure Laterality Date  . Transurethral resection of bladder tumor with gyrus (turbt-gyrus) N/A 01/23/2013    Procedure: TRANSURETHRAL RESECTION OF BLADDER TUMOR WITH GYRUS ;  Surgeon: Franchot Gallo, MD;  Location: [facility];  Service: Urology;  Laterality: N/A;  . Appendectomy  1980'S  . Tubal ligation  1980'S  . Transurethral resection of bladder tumor N/A 02/16/2013    Procedure: TRANSURETHRAL RESECTION OF BLADDER TUMOR (TURBT);  Surgeon: Franchot Gallo, MD;  Location: [facility];  Service: Urology;  Laterality: N/A;    Family History  Problem Relation Age of Onset  . Cancer Father     prostate    History   Social History  . Marital Status:  Widowed    Spouse Name: N/A    Number of Children: N/A  . Years of Education: N/A   Occupational History  . Not on file.   Social History Main Topics  . Smoking status: Former Smoker -- 1.00 packs/day for 35 years    Types: Cigarettes    Quit date: 07/15/2010  . Smokeless tobacco: Never Used  . Alcohol Use: No  . Drug Use: No  . Sexual Activity: No   Other Topics Concern  . Not on file   Social History Narrative  . No narrative on file    Current Outpatient Prescriptions on File Prior to Visit  Medication Sig Dispense Refill  . ADVAIR DISKUS 250-50 MCG/DOSE AEPB Inhale 1 puff into the  lungs every 12 hours  180 each  5  . Calcium Citrate-Vitamin D (CITRACAL + D PO) Take 2 tablets by mouth 3 (three) times daily.      . citalopram (CELEXA) 40 MG tablet TAKE 1 TABLET BY MOUTH DAILY  90 tablet  0  . ferrous sulfate (FEOSOL) 325 (65 FE) MG tablet Take 325 mg by mouth daily with breakfast.       . losartan (COZAAR) 50 MG tablet TAKE 1 TABLET BY MOUTH DAILY  30 tablet  2  . metFORMIN (GLUCOPHAGE) 500 MG tablet Take 1 tablet (500 mg total) by mouth 2 (two) times daily with a meal.  180 tablet  1  . metFORMIN (GLUCOPHAGE) 500 MG tablet TAKE 1 TABLET (500 MG TOTAL) BY MOUTH DAILY WITH BREAKFAST.  60 tablet  2  . omeprazole (PRILOSEC) 20 MG capsule Take 1 capsule by mouth two times daily  180 capsule  1  . oxybutynin (DITROPAN) 5 MG tablet Take 1 tablet (5 mg total) by mouth every 8 (eight) hours as needed for bladder spasms.  30 tablet  1  . pravastatin (PRAVACHOL) 10 MG tablet TAKE 1 TABLET BY MOUTH DAILY  30 tablet  0   Current Facility-Administered Medications on File Prior to Visit  Medication Dose Route Frequency Provider Last Rate Last Dose  . mitoMYcin (MUTAMYCIN) chemo injection 40 mg  40 mg Bladder Instillation Once Jorja Loa, MD        Allergies  Allergen Reactions  . Bee Venom Shortness Of Breath  . Other Shortness Of Breath    MSG.  . Latex Itching     Review of Systems  Review of Systems  Constitutional: Negative for fever and malaise/fatigue.  HENT: Negative for congestion.   Eyes: Negative for discharge.  Respiratory: Negative for shortness of breath.   Cardiovascular: Negative for chest pain, palpitations and leg swelling.  Gastrointestinal: Negative for nausea, abdominal pain and diarrhea.  Genitourinary: Negative for dysuria.  Musculoskeletal: Negative for falls.  Skin: Negative for rash.  Neurological: Negative for loss of consciousness and headaches.  Endo/Heme/Allergies: Negative for polydipsia.  Psychiatric/Behavioral: Negative for depression and suicidal ideas. The patient is not nervous/anxious and does not have insomnia.     Objective  Pulse 94  Temp(Src) 98.4 F (36.9 C) (Oral)  Ht 5\' 3"  (1.6 m)  Wt 210 lb (95.255 kg)  BMI 37.21 kg/m2  SpO2 94%  Physical Exam  Physical Exam  Constitutional: She is oriented to person, place, and time and well-developed, well-nourished, and in no distress. No distress.  HENT:  Head: Normocephalic and atraumatic.  Right Ear: External ear normal.  Left Ear: External ear normal.  Nose: Nose normal.  Mouth/Throat: Oropharynx is clear and moist. No oropharyngeal exudate.  Eyes: Conjunctivae are normal. Pupils are equal, round, and reactive to light. Right eye exhibits no discharge. Left eye exhibits no discharge. No scleral icterus.  Neck: Normal range of motion. Neck supple. No thyromegaly present.  Cardiovascular: Normal rate, regular rhythm, normal heart sounds and intact distal pulses.   No murmur heard. Pulmonary/Chest: Effort normal and breath sounds normal. No respiratory distress. She has no wheezes. She has no rales.  Abdominal: Soft. Bowel sounds are normal. She exhibits no distension and no mass. There is no tenderness.  Musculoskeletal: Normal range of motion. She exhibits no edema and no tenderness.  Lymphadenopathy:    She has no cervical adenopathy.   Neurological: She is alert and oriented to person, place, and time. She has normal reflexes. No cranial nerve deficit. Coordination normal.  Skin: Skin is warm and dry. No rash noted. She is not diaphoretic.  Psychiatric: Mood, memory and affect normal.    Lab Results  Component Value Date   TSH 0.663 11/09/2013   Lab Results  Component Value Date   WBC 6.1 11/09/2013   HGB 13.6 11/09/2013   HCT 40.0 11/09/2013   MCV 89.5 11/09/2013   PLT 209 11/09/2013   Lab Results  Component Value Date   CREATININE 0.66 11/09/2013   BUN 14 11/09/2013   NA 137 11/09/2013   K 4.3 11/09/2013   CL 100 11/09/2013   CO2 27 11/09/2013  Lab Results  Component Value Date   ALT 13 11/09/2013   AST 16 11/09/2013   ALKPHOS 45 11/09/2013   BILITOT 0.5 11/09/2013   Lab Results  Component Value Date   CHOL 148 11/09/2013   Lab Results  Component Value Date   HDL 56 11/09/2013   Lab Results  Component Value Date   LDLCALC 68 11/09/2013   Lab Results  Component Value Date   TRIG 118 11/09/2013   Lab Results  Component Value Date   CHOLHDL 2.6 11/09/2013     Assessment & Plan  Hypertension Well controlled, no changes to meds. Encouraged heart healthy diet such as the DASH diet and exercise as tolerated.   Diabetes mellitus type 2 in obese hgba1c acceptable, minimize simple carbs. Increase exercise as tolerated. Continue current meds  Hyperlipidemia Tolerating statin, encouraged heart healthy diet, avoid trans fats, minimize simple carbs and saturated fats. Increase exercise as tolerated  COPD (chronic obstructive pulmonary disease) No recent flares, is given an rx for Albuterol to use prn  Depression Widowed 4 years ago and still slightly tearful but doing well for the most part, no need for medication  Malignant neoplasm of bladder, part unspecified Just had another cyst removed with urology, Dr Dorina Hoyer. Has close follow up  Medicare annual wellness visit, initial Declines  colonoscopy Sees Dr Dorina Hoyer of urology. Patient denies any difficulties at home. No trouble with ADLs, depression or falls. No recent changes to vision or hearing. Is UTD with immunizations. Is UTD with screening. Discussed Advanced Directives, patient agrees to bring [place] copies of documents if can. Encouraged heart healthy diet, exercise as tolerated and adequate sleep. Declines referral for colonoscopy and is unwilling to complete Cologuard. Given Prevnar and flu shot today  Esophageal reflux Avoid offending foods, start probiotics. Do not eat large meals in late evening and consider raising head of bed.   Tachycardia Improved on recheck

## 2013-11-17 NOTE — Assessment & Plan Note (Signed)
hgba1c acceptable, minimize simple carbs. Increase exercise as tolerated. Continue current meds 

## 2013-11-17 NOTE — Assessment & Plan Note (Signed)
No recent flares, is given an rx for Albuterol to use prn

## 2013-11-17 NOTE — Assessment & Plan Note (Addendum)
Declines colonoscopy Sees Dr Dorina Hoyer of urology. Patient denies any difficulties at home. No trouble with ADLs, depression or falls. No recent changes to vision or hearing. Is UTD with immunizations. Is UTD with screening. Discussed Advanced Directives, patient agrees to bring [place] copies of documents if can. Encouraged heart healthy diet, exercise as tolerated and adequate sleep. Declines referral for colonoscopy and is unwilling to complete Cologuard. Given Prevnar and flu shot today

## 2013-11-17 NOTE — Assessment & Plan Note (Signed)
Just had another cyst removed with urology, Dr Dorina Hoyer. Has close follow up

## 2013-11-17 NOTE — Patient Instructions (Signed)
Watch the sodium, caffeine, get your sleep  Jobst stockings light weight knee hi, order on line or go to a medical supply store   Hypertension Hypertension, commonly called high blood pressure, is when the force of blood pumping through your arteries is too strong. Your arteries are the blood vessels that carry blood from your heart throughout your body. A blood pressure reading consists of a higher number over a lower number, such as 110/72. The higher number (systolic) is the pressure inside your arteries when your heart pumps. The lower number (diastolic) is the pressure inside your arteries when your heart relaxes. Ideally you want your blood pressure below 120/80. Hypertension forces your heart to work harder to pump blood. Your arteries may become narrow or stiff. Having hypertension puts you at risk for heart disease, stroke, and other problems.  RISK FACTORS Some risk factors for high blood pressure are controllable. Others are not.  Risk factors you cannot control include:   Race. You may be at higher risk if you are African American.  Age. Risk increases with age.  Gender. Men are at higher risk than women before age 33 years. After age 19, women are at higher risk than men. Risk factors you can control include:  Not getting enough exercise or physical activity.  Being overweight.  Getting too much fat, sugar, calories, or salt in your diet.  Drinking too much alcohol. SIGNS AND SYMPTOMS Hypertension does not usually cause signs or symptoms. Extremely high blood pressure (hypertensive crisis) may cause headache, anxiety, shortness of breath, and nosebleed. DIAGNOSIS  To check if you have hypertension, your health care provider will measure your blood pressure while you are seated, with your arm held at the level of your heart. It should be measured at least twice using the same arm. Certain conditions can cause a difference in blood pressure between your right and left arms. A  blood pressure reading that is higher than normal on one occasion does not mean that you need treatment. If one blood pressure reading is high, ask your health care provider about having it checked again. TREATMENT  Treating high blood pressure includes making lifestyle changes and possibly taking medicine. Living a healthy lifestyle can help lower high blood pressure. You may need to change some of your habits. Lifestyle changes may include:  Following the DASH diet. This diet is high in fruits, vegetables, and whole grains. It is low in salt, red meat, and added sugars.  Getting at least 2 hours of brisk physical activity every week.  Losing weight if necessary.  Not smoking.  Limiting alcoholic beverages.  Learning ways to reduce stress. If lifestyle changes are not enough to get your blood pressure under control, your health care provider may prescribe medicine. You may need to take more than one. Work closely with your health care provider to understand the risks and benefits. HOME CARE INSTRUCTIONS  Have your blood pressure rechecked as directed by your health care provider.   Take medicines only as directed by your health care provider. Follow the directions carefully. Blood pressure medicines must be taken as prescribed. The medicine does not work as well when you skip doses. Skipping doses also puts you at risk for problems.   Do not smoke.   Monitor your blood pressure at home as directed by your health care provider. SEEK MEDICAL CARE IF:   You think you are having a reaction to medicines taken.  You have recurrent headaches or feel dizzy.  You have swelling in your ankles.  You have trouble with your vision. SEEK IMMEDIATE MEDICAL CARE IF:  You develop a severe headache or confusion.  You have unusual weakness, numbness, or feel faint.  You have severe chest or abdominal pain.  You vomit repeatedly.  You have trouble breathing. MAKE SURE YOU:    Understand these instructions.  Will watch your condition.  Will get help right away if you are not doing well or get worse. Document Released: 03/02/2005 Document Revised: 07/17/2013 Document Reviewed: 12/23/2012 [facility] Patient Information 2015 [place], [place]. This information is not intended to replace advice given to you by your health care provider. Make sure you discuss any questions you have with your health care provider.

## 2013-11-17 NOTE — Assessment & Plan Note (Signed)
Well controlled, no changes to meds. Encouraged heart healthy diet such as the DASH diet and exercise as tolerated.  °

## 2013-11-17 NOTE — Progress Notes (Signed)
Pre visit review using our clinic review tool, if applicable. No additional management support is needed unless otherwise documented below in the visit note. 

## 2013-11-17 NOTE — Assessment & Plan Note (Signed)
Tolerating statin, encouraged heart healthy diet, avoid trans fats, minimize simple carbs and saturated fats. Increase exercise as tolerated 

## 2013-11-17 NOTE — Assessment & Plan Note (Signed)
Widowed 4 years ago and still slightly tearful but doing well for the most part, no need for medication

## 2013-11-18 ENCOUNTER — Other Ambulatory Visit: Payer: Self-pay | Admitting: Family Medicine

## 2013-11-20 NOTE — Assessment & Plan Note (Signed)
Avoid offending foods, start probiotics. Do not eat large meals in late evening and consider raising head of bed.  

## 2013-11-20 NOTE — Assessment & Plan Note (Signed)
Improved on recheck.  

## 2013-11-21 NOTE — Telephone Encounter (Signed)
Please advise Metformin directions? We received a refill request but I see two directions: take 1 daily and take 1 bid?

## 2013-11-21 NOTE — Telephone Encounter (Signed)
I spoke to patient and she clarified that this is taken 1 tablet bid daily  rx sent and MAR updated

## 2013-11-21 NOTE — Telephone Encounter (Signed)
I will need you to clarify, I did not clarify at last visit whatever she is taking she should continue 30 day with 5 rf or 90 day with 1 at patient discretion

## 2013-11-22 ENCOUNTER — Telehealth: Payer: Self-pay | Admitting: Family Medicine

## 2013-11-22 DIAGNOSIS — J449 Chronic obstructive pulmonary disease, unspecified: Secondary | ICD-10-CM

## 2013-11-22 NOTE — Telephone Encounter (Signed)
Insurance will not cover albuterol (PROVENTIL HFA;VENTOLIN HFA) 108 (90 BASE) MCG/ACT inhaler, can it be changed Proair? Please advise

## 2013-11-22 NOTE — Telephone Encounter (Signed)
Yes it can be changed to Proair same sig, same number

## 2013-11-23 MED ORDER — ALBUTEROL SULFATE HFA 108 (90 BASE) MCG/ACT IN AERS: 2.0000 | INHALATION_SPRAY | Freq: Four times a day (QID) | RESPIRATORY_TRACT | Status: DC | PRN

## 2013-11-24 ENCOUNTER — Other Ambulatory Visit: Payer: Self-pay

## 2013-11-24 ENCOUNTER — Telehealth: Payer: Self-pay | Admitting: Family Medicine

## 2013-11-24 DIAGNOSIS — J449 Chronic obstructive pulmonary disease, unspecified: Secondary | ICD-10-CM

## 2013-11-24 MED ORDER — ALBUTEROL SULFATE HFA 108 (90 BASE) MCG/ACT IN AERS: 2.0000 | INHALATION_SPRAY | Freq: Four times a day (QID) | RESPIRATORY_TRACT | Status: DC | PRN

## 2013-11-24 NOTE — Telephone Encounter (Signed)
Resent RX to [facility]

## 2013-11-24 NOTE — Telephone Encounter (Signed)
Caller name: Phuong  Relation to pt: other  Call back number: [phone] Pharmacy:  [facility]  Reason for call:   regarding albuterol (PROVENTIL HFA;VENTOLIN HFA) 108 (90 BASE) MCG/ACT inhaler rx stated 2 different inhalers that are not covered insurance. [facility] suggested "ProAir Inhaler"   refernece # [phone]

## 2013-12-01 DIAGNOSIS — C679 Malignant neoplasm of bladder, unspecified: Secondary | ICD-10-CM | POA: Diagnosis not present

## 2013-12-04 ENCOUNTER — Other Ambulatory Visit: Payer: Self-pay | Admitting: Urology

## 2013-12-12 ENCOUNTER — Encounter (HOSPITAL_BASED_OUTPATIENT_CLINIC_OR_DEPARTMENT_OTHER): Payer: Self-pay | Admitting: *Deleted

## 2013-12-13 ENCOUNTER — Encounter (HOSPITAL_BASED_OUTPATIENT_CLINIC_OR_DEPARTMENT_OTHER): Payer: Self-pay | Admitting: *Deleted

## 2013-12-13 NOTE — Progress Notes (Signed)
NPO AFTER MN. ARRIVE AT [id number]. NEEDS ISTAT .   CURRENT EKG IN CHART AND EPIC. WILL TAKE COZAAR, PRILOSEC, AND DO ADVAIR INHALER AM DOS W/ SIPS OF WATER.

## 2013-12-18 ENCOUNTER — Encounter (HOSPITAL_BASED_OUTPATIENT_CLINIC_OR_DEPARTMENT_OTHER): Payer: Self-pay | Admitting: *Deleted

## 2013-12-18 ENCOUNTER — Encounter (HOSPITAL_BASED_OUTPATIENT_CLINIC_OR_DEPARTMENT_OTHER): Payer: Medicare Other | Admitting: Anesthesiology

## 2013-12-18 ENCOUNTER — Ambulatory Visit (HOSPITAL_BASED_OUTPATIENT_CLINIC_OR_DEPARTMENT_OTHER)
Admission: RE | Admit: 2013-12-18 | Discharge: 2013-12-18 | Disposition: A | Discharge status: Home / Self Care | Payer: Medicare Other | Source: Ambulatory Visit | Attending: Urology | Admitting: Urology

## 2013-12-18 ENCOUNTER — Ambulatory Visit (HOSPITAL_BASED_OUTPATIENT_CLINIC_OR_DEPARTMENT_OTHER): Payer: Medicare Other | Admitting: Anesthesiology

## 2013-12-18 ENCOUNTER — Encounter (HOSPITAL_BASED_OUTPATIENT_CLINIC_OR_DEPARTMENT_OTHER)
Admission: RE | Disposition: A | Discharge status: Home / Self Care | Payer: Self-pay | Source: Ambulatory Visit | Attending: Urology

## 2013-12-18 DIAGNOSIS — C679 Malignant neoplasm of bladder, unspecified: Secondary | ICD-10-CM | POA: Insufficient documentation

## 2013-12-18 DIAGNOSIS — Z91030 Bee allergy status: Secondary | ICD-10-CM | POA: Insufficient documentation

## 2013-12-18 DIAGNOSIS — Z91040 Latex allergy status: Secondary | ICD-10-CM | POA: Diagnosis not present

## 2013-12-18 DIAGNOSIS — D509 Iron deficiency anemia, unspecified: Secondary | ICD-10-CM | POA: Diagnosis not present

## 2013-12-18 DIAGNOSIS — F329 Major depressive disorder, single episode, unspecified: Secondary | ICD-10-CM | POA: Insufficient documentation

## 2013-12-18 DIAGNOSIS — K219 Gastro-esophageal reflux disease without esophagitis: Secondary | ICD-10-CM | POA: Diagnosis not present

## 2013-12-18 DIAGNOSIS — C673 Malignant neoplasm of anterior wall of bladder: Secondary | ICD-10-CM | POA: Diagnosis not present

## 2013-12-18 DIAGNOSIS — J449 Chronic obstructive pulmonary disease, unspecified: Secondary | ICD-10-CM | POA: Diagnosis not present

## 2013-12-18 DIAGNOSIS — E119 Type 2 diabetes mellitus without complications: Secondary | ICD-10-CM | POA: Diagnosis not present

## 2013-12-18 DIAGNOSIS — M1990 Unspecified osteoarthritis, unspecified site: Secondary | ICD-10-CM | POA: Diagnosis not present

## 2013-12-18 DIAGNOSIS — Z87891 Personal history of nicotine dependence: Secondary | ICD-10-CM | POA: Diagnosis not present

## 2013-12-18 DIAGNOSIS — I10 Essential (primary) hypertension: Secondary | ICD-10-CM | POA: Insufficient documentation

## 2013-12-18 HISTORY — PX: CYSTOSCOPY: SHX5120

## 2013-12-18 HISTORY — PX: TRANSURETHRAL RESECTION OF BLADDER TUMOR: SHX2575

## 2013-12-18 LAB — POCT I-STAT 4, (NA,K, GLUC, HGB,HCT)
Glucose, Bld: 126 mg/dL — ABNORMAL HIGH (ref 70–99)
HCT: 41 % (ref 36.0–46.0)
Hemoglobin: 13.9 g/dL (ref 12.0–15.0)
Potassium: 4.1 mEq/L (ref 3.7–5.3)
Sodium: 138 mEq/L (ref 137–147)

## 2013-12-18 LAB — GLUCOSE, CAPILLARY: GLUCOSE-CAPILLARY: 110 mg/dL — AB (ref 70–99)

## 2013-12-18 SURGERY — TURBT (TRANSURETHRAL RESECTION OF BLADDER TUMOR)
Anesthesia: General | Site: Bladder

## 2013-12-18 MED ORDER — CEFAZOLIN SODIUM-DEXTROSE 2-3 GM-% IV SOLR
INTRAVENOUS | Status: AC
  Filled 2013-12-18: qty 50

## 2013-12-18 MED ORDER — CEPHALEXIN 250 MG PO CAPS: 250.0000 mg | ORAL_CAPSULE | Freq: Two times a day (BID) | ORAL | Status: DC

## 2013-12-18 MED ORDER — STERILE WATER FOR IRRIGATION IR SOLN
Status: DC | PRN
  Administered 2013-12-18: 3000 mL

## 2013-12-18 MED ORDER — CEFAZOLIN SODIUM 1-5 GM-% IV SOLN
1.0000 g | INTRAVENOUS | Status: DC
  Filled 2013-12-18: qty 50

## 2013-12-18 MED ORDER — BELLADONNA ALKALOIDS-OPIUM 16.2-60 MG RE SUPP
RECTAL | Status: DC | PRN
  Administered 2013-12-18: 1 via RECTAL

## 2013-12-18 MED ORDER — LACTATED RINGERS IV SOLN
INTRAVENOUS | Status: DC
  Administered 2013-12-18: 09:00:00 via INTRAVENOUS
  Filled 2013-12-18: qty 1000

## 2013-12-18 MED ORDER — EPHEDRINE SULFATE 50 MG/ML IJ SOLN
INTRAMUSCULAR | Status: DC | PRN
  Administered 2013-12-18: 10 mg via INTRAVENOUS

## 2013-12-18 MED ORDER — ONDANSETRON HCL 4 MG/2ML IJ SOLN
INTRAMUSCULAR | Status: DC | PRN
  Administered 2013-12-18: 4 mg via INTRAVENOUS

## 2013-12-18 MED ORDER — MIDAZOLAM HCL 2 MG/2ML IJ SOLN
INTRAMUSCULAR | Status: AC
  Filled 2013-12-18: qty 2

## 2013-12-18 MED ORDER — BELLADONNA ALKALOIDS-OPIUM 16.2-60 MG RE SUPP
RECTAL | Status: AC
  Filled 2013-12-18: qty 1

## 2013-12-18 MED ORDER — DEXAMETHASONE SODIUM PHOSPHATE 10 MG/ML IJ SOLN
INTRAMUSCULAR | Status: DC | PRN
  Administered 2013-12-18: 4 mg via INTRAVENOUS

## 2013-12-18 MED ORDER — LIDOCAINE HCL (CARDIAC) 20 MG/ML IV SOLN
INTRAVENOUS | Status: DC | PRN
  Administered 2013-12-18: 80 mg via INTRAVENOUS

## 2013-12-18 MED ORDER — PROPOFOL INFUSION 10 MG/ML OPTIME
INTRAVENOUS | Status: DC | PRN
  Administered 2013-12-18: 200 mL via INTRAVENOUS
  Administered 2013-12-18: 60 mL via INTRAVENOUS

## 2013-12-18 MED ORDER — TRAMADOL HCL 50 MG PO TABS: 50.0000 mg | ORAL_TABLET | Freq: Four times a day (QID) | ORAL | Status: DC | PRN

## 2013-12-18 MED ORDER — MIDAZOLAM HCL 5 MG/5ML IJ SOLN
INTRAMUSCULAR | Status: DC | PRN
  Administered 2013-12-18: 2 mg via INTRAVENOUS

## 2013-12-18 MED ORDER — FENTANYL CITRATE 0.05 MG/ML IJ SOLN
INTRAMUSCULAR | Status: AC
  Filled 2013-12-18: qty 4

## 2013-12-18 MED ORDER — CEFAZOLIN SODIUM-DEXTROSE 2-3 GM-% IV SOLR
2.0000 g | INTRAVENOUS | Status: AC
  Administered 2013-12-18: 2 g via INTRAVENOUS
  Filled 2013-12-18: qty 50

## 2013-12-18 SURGICAL SUPPLY — 31 items
BAG DRAIN URO-CYSTO SKYTR STRL (DRAIN) ×2 IMPLANT
BAG URINE DRAINAGE (UROLOGICAL SUPPLIES) IMPLANT
BAG URINE LEG 19OZ MD ST LTX (BAG) IMPLANT
CANISTER SUCT LVC 12 LTR MEDI- (MISCELLANEOUS) ×2 IMPLANT
CATH FOLEY 2WAY SLVR  5CC 20FR (CATHETERS)
CATH FOLEY 2WAY SLVR  5CC 22FR (CATHETERS)
CATH FOLEY 2WAY SLVR 5CC 20FR (CATHETERS) IMPLANT
CATH FOLEY 2WAY SLVR 5CC 22FR (CATHETERS) IMPLANT
CATH FOLEY 3WAY 30CC 22F (CATHETERS) IMPLANT
CLOTH BEACON ORANGE TIMEOUT ST (SAFETY) ×2 IMPLANT
DRAPE CAMERA CLOSED 9X96 (DRAPES) ×2 IMPLANT
ELECT BUTTON BIOP 24F 90D PLAS (MISCELLANEOUS) IMPLANT
ELECT LOOP HF 26F 30D .35MM (CUTTING LOOP) IMPLANT
ELECT NEEDLE 45D HF 24-28F 12D (CUTTING LOOP) IMPLANT
ELECT REM PT RETURN 9FT ADLT (ELECTROSURGICAL) ×2
ELECTRODE REM PT RTRN 9FT ADLT (ELECTROSURGICAL) ×1 IMPLANT
EVACUATOR MICROVAS BLADDER (UROLOGICAL SUPPLIES) IMPLANT
GLOVE BIO SURGEON STRL SZ8 (GLOVE) ×2 IMPLANT
GLOVE INDICATOR 7.5 STRL GRN (GLOVE) ×2 IMPLANT
GOWN STRL REIN XL XLG (GOWN DISPOSABLE) IMPLANT
GOWN STRL REUS W/TWL XL LVL3 (GOWN DISPOSABLE) ×4 IMPLANT
GOWN XL W/COTTON TOWEL STD (GOWNS) IMPLANT
HOLDER FOLEY CATH W/STRAP (MISCELLANEOUS) IMPLANT
LOOP CUTTING 24FR OLYMPUS (CUTTING LOOP) IMPLANT
NEEDLE HYPO 22GX1.5 SAFETY (NEEDLE) IMPLANT
NS IRRIG 500ML POUR BTL (IV SOLUTION) IMPLANT
PACK CYSTO (CUSTOM PROCEDURE TRAY) ×2 IMPLANT
PLUG CATH AND CAP STER (CATHETERS) IMPLANT
SET ASPIRATION TUBING (TUBING) IMPLANT
SYRINGE IRR TOOMEY STRL 70CC (SYRINGE) IMPLANT
WATER STERILE IRR 3000ML UROMA (IV SOLUTION) ×2 IMPLANT

## 2013-12-18 NOTE — Anesthesia Postprocedure Evaluation (Signed)
  Anesthesia Post-op Note  Patient: Savannah Stanley  Procedure(s) Performed: Procedure(s) (LRB): TRANSURETHRAL RESECTION OF BLADDER TUMOR (TURBT)  (N/A) CYSTOSCOPY (N/A)  Patient Location: PACU  Anesthesia Type: General  Level of Consciousness: awake and alert   Airway and Oxygen Therapy: Patient Spontanous Breathing  Post-op Pain: mild  Post-op Assessment: Post-op Vital signs reviewed, Patient's Cardiovascular Status Stable, Respiratory Function Stable, Patent Airway and No signs of Nausea or vomiting  Last Vitals:  Filed Vitals:   12/18/13 1115  BP: 134/70  Pulse: 81  Temp:   Resp: 17    Post-op Vital Signs: stable   Complications: No apparent anesthesia complications

## 2013-12-18 NOTE — Op Note (Signed)
PATIENT:  Savannah Stanley  PRE-OPERATIVE DIAGNOSIS: Recurrent non-muscle invasive bladder cancer  POST-OPERATIVE DIAGNOSIS: Same  PROCEDURE: Cystoscopy, TURBT 73mm lesion located on left anterior bladder wall  SURGEON:  Lillette Boxer. Cherokee Boccio, M.D.  ANESTHESIA:  General  EBL:  Minimal  DRAINS: None  LOCAL MEDICATIONS USED:  None  SPECIMEN:  Bladder tumor, to pathology  INDICATION: Savannah Stanley is a 63 year old female with recurrent non-muscle invasive bladder cancer, recently found on routine surveillance cystoscopy. She presents at this time for repeat TURBT.  Description of procedure: The patient was properly identified and marked (if applicable) in the holding area. They were then  taken to the operating room and placed on the table in a supine position. General anesthesia was then administered. Once fully anesthetized the patient was moved to the dorsolithotomy position and the genitalia and perineum were sterilely prepped and draped in standard fashion. An official timeout was then performed.   A 22 French panendoscope was advanced into the bladder which was circumferentially inspected with both the 12 and 70 lenses. Ureteral orifices were normal in configuration and location. There were no foreign bodies are trabeculations. An 21mm tumor was located anteriorly on the left bladder wall. No other urothelial abnormalities were noted except for scarring consistent with prior resections. A cold cup biopsy forceps was placed on the 12 lens and the bladder tumor was resected in 3-4 bites. These were sent labeled "bladder tumor". Following resection of the entire lesion in the base, I then cauterized the resected area with the Bugbee electrode. There was no bleeding after this. The bladder was irrigated out several times with sterile water. I did not leave mitomycin in.  The procedure was terminated after the scope was removed. The patient was awakened and taken to the PACU in stable  condition.   PLAN OF CARE: Discharge to home after PACU  PATIENT DISPOSITION:  PACU - hemodynamically stable.

## 2013-12-18 NOTE — Discharge Instructions (Signed)
Transurethral Resection of Bladder Tumor (TURBT)  °Definition:  °Transurethral Resection of the Bladder Tumor is a surgical procedure used to diagnose and remove tumors within the bladder. TURBT is the most common treatment for early stage bladder cancer.  ° °General instructions:  °Your recent bladder surgery requires very little post hospital care but some definite precautions.  °Despite the fact that no skin incisions were used, the area around the tumor removal site is raw and covered with scabs to promote healing and prevent bleeding. Certain precautions are needed to insure that the scabs are not disturbed over the next 2-4 weeks while the healing proceeds.  °Because the raw surface inside your bladder and the irritating effects of urine you may expect frequency of urination and/or urgency (a stronger desire to urinate) and perhaps even getting up at night more often. This will usually resolve or improve slowly over the healing period. You may see some blood in your urine over the first 6 weeks. Do not be alarmed, even if the urine was clear for a while. Get off your feet and drink lots of fluids until clearing occurs. If you start to pass clots or don't improve call us.  ° °Diet:  °You may return to your normal diet immediately. Because of the raw surface of your bladder, alcohol, spicy foods, foods high in acid and drinks with caffeine may cause irritation or frequency and should be used in moderation. To keep your urine flowing freely and avoid constipation, drink plenty of fluids during the day (8-10 glasses). Tip: Avoid cranberry juice because it is very acidic. °  °Activity:  °Your physical activity needs to be restricted somewhat.  We suggest that you reduce your activity under the circumstances until the bleeding has stopped. Heavy lifting (greater than 20 lbs.) and heavy exertion should be limited for 2-3 weeks. ° °Bowels:  °It is important to keep your bowels regular during the postoperative period.  Straining with bowel movements can cause bleeding. A bowel movement every other day is reasonable. Use a mild laxative if needed, such as milk of magnesia 2-3 tablespoons, or 2 Dulcolax tablets. Call if you continue to have problems. If you had been taking narcotics for pain, before, during or after your surgery, you may be constipated.  ° °Medication:  °You should resume your pre-surgery medications unless told not to. In addition you may be given an antibiotic to prevent or treat infection. Antibiotics are not always necessary. All medication should be taken as prescribed until the bottles are finished unless you are having an unusual reaction to one of the drugs.  °General Anesthetic, Adult  °A doctor specialized in giving anesthesia (anesthesiologist) or a nurse specialized in giving anesthesia (nurse anesthetist) gives medicine that makes you sleep while a procedure is performed (general anesthetic). Once the general anesthetic has been administered, you will be in a sleeplike state in which you feel no pain. After having a general anesthetic you may feel:  °Dizzy.  °Weak.  °Drowsy.  °Confused.  °These feelings are normal and can be expected to last for up to 24 hours after the procedure. ° ° °LET YOUR CAREGIVER KNOW ABOUT:  °Allergies you have.  °Medications you are taking, including herbs, eye drops, over the counter medications, dietary supplements, and creams.  °Previous problems you have had with anesthetics or numbing medicines.  °Use of cigarettes, alcohol, or illicit drugs.  °Possibility of pregnancy, if this applies.  °History of bleeding or blood disorders, including blood clots and clotting   disorders.  Previous surgeries you have had and types of anesthetics you have received.  Family medical history, especially anesthetic problems.  Other health problems.   AFTER THE PROCEDURE  After surgery, you will be taken to the recovery area where a nurse will monitor your progress. You will be allowed  to go home when you are awake, stable, taking fluids well, and without serious pain or complications.  For the first 24 hours following an anesthetic:  Have a responsible person with you.  Do not drive a car. If you are alone, do not take public transportation.  Do not engage in strenuous activity. You may usually resume normal activities the next day, or as advised by your caregiver.  Do not drink alcohol.  Do not take medicine that has not been prescribed by your caregiver.  Do not sign important papers or make important decisions as your judgement may be impaired.  You may resume a normal diet as directed.  Change bandages (dressings) as directed.  Only take over-the-counter or prescription medicines for pain, discomfort, or fever as directed by your caregiver.  If you have questions or problems that seem related to the anesthetic, call the hospital and ask for the anesthetist, anesthesiologist, or anesthesia department.   SEEK IMMEDIATE MEDICAL CARE IF:  You develop a rash.  You have difficulty breathing.  You have chest pain.  You have allergic problems.  You have uncontrolled nausea.  You have uncontrolled vomiting.  You develop any serious bleeding, especially from the incision site.  Document Released: 06/09/2007 Document Revised: 11/12/2010 Document Reviewed: 07/03/2010  [facility] Patient Information 2012 [place].    Post Anesthesia Home Care Instructions  Activity: Get plenty of rest for the remainder of the day. A responsible adult should stay with you for 24 hours following the procedure.  For the next 24 hours, DO NOT: -Drive a car -Paediatric nurse -Drink alcoholic beverages -Take any medication unless instructed by your physician -Make any legal decisions or sign important papers.  Meals: Start with liquid foods such as gelatin or soup. Progress to regular foods as tolerated. Avoid greasy, spicy, heavy foods. If nausea and/or vomiting occur, drink only  clear liquids until the nausea and/or vomiting subsides. Call your physician if vomiting continues.  Special Instructions/Symptoms: Your throat may feel dry or sore from the anesthesia or the breathing tube placed in your throat during surgery. If this causes discomfort, gargle with warm salt water. The discomfort should disappear within 24 hours.

## 2013-12-18 NOTE — Transfer of Care (Signed)
Immediate Anesthesia Transfer of Care Note  Patient: Savannah Stanley  Procedure(s) Performed: Procedure(s): TRANSURETHRAL RESECTION OF BLADDER TUMOR (TURBT)  (N/A) CYSTOSCOPY (N/A)  Patient Location: PACU  Anesthesia Type:General  Level of Consciousness: awake, alert , oriented and patient cooperative  Airway & Oxygen Therapy: Patient Spontanous Breathing and Patient connected to nasal cannula oxygen  Post-op Assessment: Report given to PACU RN and Post -op Vital signs reviewed and stable  Post vital signs: Reviewed and stable  Complications: No apparent anesthesia complications

## 2013-12-18 NOTE — Anesthesia Procedure Notes (Signed)
Procedure Name: LMA Insertion Date/Time: 12/18/2013 10:09 AM Performed by: Wanita Chamberlain Pre-anesthesia Checklist: Patient identified, Emergency Drugs available, Suction available, Timeout performed and Patient being monitored Patient Re-evaluated:Patient Re-evaluated prior to inductionOxygen Delivery Method: Circle system utilized Preoxygenation: Pre-oxygenation with 100% oxygen Intubation Type: IV induction Ventilation: Mask ventilation without difficulty LMA: LMA inserted LMA Size: 4.0 Number of attempts: 1 Airway Equipment and Method: Bite block Placement Confirmation: positive ETCO2 Tube secured with: Tape Dental Injury: Teeth and Oropharynx as per pre-operative assessment

## 2013-12-18 NOTE — Anesthesia Preprocedure Evaluation (Addendum)
Anesthesia Evaluation  Patient identified by MRN, date of birth, ID band Patient awake    Reviewed: Allergy & Precautions, H&P , NPO status , Patient's Chart, lab work & pertinent test results  Airway Mallampati: II TM Distance: >3 FB Neck ROM: Full    Dental no notable dental hx. (+) Teeth Intact, Dental Advisory Given   Pulmonary neg pulmonary ROS, COPDformer smoker,  breath sounds clear to auscultation  Pulmonary exam normal       Cardiovascular Exercise Tolerance: Good hypertension, Pt. on medications Rhythm:Regular Rate:Normal     Neuro/Psych Depression negative neurological ROS  negative psych ROS   GI/Hepatic Neg liver ROS, hiatal hernia, GERD-  Medicated and Controlled,  Endo/Other  diabetes, Type 2, Oral Hypoglycemic Agents  Renal/GU negative Renal ROS  negative genitourinary   Musculoskeletal negative musculoskeletal ROS (+) Arthritis -, Osteoarthritis,    Abdominal   Peds negative pediatric ROS (+)  Hematology negative hematology ROS (+)   Anesthesia Other Findings Lateral crown. Upper none in front  Reproductive/Obstetrics negative OB ROS                      Anesthesia Physical Anesthesia Plan  ASA: III  Anesthesia Plan: General   Post-op Pain Management:    Induction: Intravenous  Airway Management Planned: LMA  Additional Equipment:   Intra-op Plan:   Post-operative Plan: Extubation in OR  Informed Consent: I have reviewed the patients History and Physical, chart, labs and discussed the procedure including the risks, benefits and alternatives for the proposed anesthesia with the patient or authorized representative who has indicated his/her understanding and acceptance.   Dental advisory given  Plan Discussed with: CRNA  Anesthesia Plan Comments:         Anesthesia Quick Evaluation

## 2013-12-18 NOTE — H&P (Signed)
H&P  Chief Complaint: Bladder cancer  History of Present Illness: Savannah Stanley is a 63 y.o. year old female who presents for cysto/TURBT of a small recurrence of NMIBC. Her history is as follows:   I initially saw this lady in consultation on January 18 2013 for gross hematuria and anemia. She ended up having a very large bladder tumor. She had an initial TURBT on 01/23/2013. That was a lengthy procedure, and I was unable to resect all of her bladder tumor. Pathology revealed high-grade urothelial carcinoma and a papillary configuration with no evidence of stromal or muscular involvement. She did have a couple of small perforations, and I left her catheter in. She was discharged home on postoperative day 3.    She required transfusions both before and after her resection. It was obvious that her bleeding had been going on for quite some time before her presentation to the hospital for weakness and gross hematuria.     She underwent a second resection on 02/16/2013. Mitomycin was not given at that time, as she did have a perforation as well. Pathology this time revealed low-grade urothelial carcinoma in a papillary configuration, again without stromal or muscle invasion. Muscle was present in the specimen.    Because of her large volume bladder cancer, she underwent BCG treatment. She completed her 6 induction treatments on 04/28/2013. She tolerated these well.     She had a 2 mm papillary recurrence treated with electrocautery in July, 2015. Recent cysto in the office revealed a recurrence too large to be managed in the office.  Past Medical History  Diagnosis Date  . Hypertension   . GERD (gastroesophageal reflux disease)   . H/O hiatal hernia   . Type 2 diabetes mellitus   . Iron deficiency anemia   . Bladder cancer UROLOGIST-- DR Diona Fanti    HIGH-GRADE UROTHELIAL CARCINOMA  . Depression   . COPD (chronic obstructive pulmonary disease)     Past Surgical History  Procedure  Laterality Date  . Transurethral resection of bladder tumor with gyrus (turbt-gyrus) N/A 01/23/2013    Procedure: TRANSURETHRAL RESECTION OF BLADDER TUMOR WITH GYRUS ;  Surgeon: Franchot Gallo, MD;  Location: [facility];  Service: Urology;  Laterality: N/A;  . Appendectomy  1980'S  . Tubal ligation  1980'S  . Transurethral resection of bladder tumor N/A 02/16/2013    Procedure: TRANSURETHRAL RESECTION OF BLADDER TUMOR (TURBT);  Surgeon: Franchot Gallo, MD;  Location: [facility];  Service: Urology;  Laterality: N/A;    Home Medications:  No prescriptions prior to admission    Allergies:  Allergies  Allergen Reactions  . Bee Venom Shortness Of Breath  . Other Shortness Of Breath    MSG.  . Latex Itching    Family History  Problem Relation Age of Onset  . Cancer Father     prostate  . Arthritis Brother     Social History:  reports that she quit smoking about 3 years ago. Her smoking use included Cigarettes. She has a 35 pack-year smoking history. She has never used smokeless tobacco. She reports that she does not drink alcohol or use illicit drugs.  ROS: A complete review of systems was performed.  All systems are negative except for pertinent findings as noted.  Physical Exam:  Vital signs in last 24 hours:   General:  Alert and oriented, No acute distress HEENT: Normocephalic, atraumatic Neck: No JVD or lymphadenopathy Cardiovascular: Regular rate and rhythm Lungs: Clear bilaterally Abdomen: Soft, nontender, nondistended,  no abdominal masses Back: No CVA tenderness Extremities: No edema Neurologic: Grossly intact  Laboratory Data:  No results found for this or any previous visit (from the past 24 hour(s)). No results found for this or any previous visit (from the past 240 hour(s)). Creatinine: No results found for this basename: CREATININE,  in the last 168 hours  Radiologic Imaging: No results found.  Impression/Assessment:  Recurrent  NMIBC  Plan: TURBT  Jorja Loa 12/18/2013, 6:07 AM  Lillette Boxer. Claudean Leavelle MD

## 2013-12-19 ENCOUNTER — Encounter (HOSPITAL_BASED_OUTPATIENT_CLINIC_OR_DEPARTMENT_OTHER): Payer: Self-pay | Admitting: Urology

## 2013-12-27 ENCOUNTER — Telehealth: Payer: Self-pay | Admitting: Family Medicine

## 2013-12-27 NOTE — Telephone Encounter (Signed)
Patient called in stating that she had a sinus infection. Patient wanted to see Dr Charlett Blake. I informed her that Dr. Charlett Blake was not in the office today. I offered appointment with Elyn Aquas, PA-C for this evening at 6:15pm. That was the first available time our office had. Patient said that was too late a time for her. I offered patient appointment for this afternoon at another [place] office. She didn't want to go. I told her that next available with Dr. Charlett Blake was on Friday if she didn't want to see another provider, patient then laughed and hung up the phone on me. No appointment scheduled.

## 2014-01-04 ENCOUNTER — Other Ambulatory Visit: Payer: Self-pay

## 2014-01-04 MED ORDER — PRAVASTATIN SODIUM 10 MG PO TABS: 10.0000 mg | ORAL_TABLET | Freq: Every day | ORAL | Status: DC

## 2014-01-08 ENCOUNTER — Other Ambulatory Visit: Payer: Self-pay | Admitting: Family Medicine

## 2014-01-10 ENCOUNTER — Other Ambulatory Visit: Payer: Self-pay | Admitting: Family Medicine

## 2014-02-13 ENCOUNTER — Other Ambulatory Visit: Payer: Self-pay | Admitting: Family Medicine

## 2014-02-16 ENCOUNTER — Ambulatory Visit (INDEPENDENT_AMBULATORY_CARE_PROVIDER_SITE_OTHER): Payer: Medicare Other | Admitting: Family Medicine

## 2014-02-16 ENCOUNTER — Encounter: Payer: Self-pay | Admitting: Family Medicine

## 2014-02-16 VITALS — BP 126/57 | HR 90 | Temp 98.6°F | Ht 63.0 in | Wt 204.2 lb

## 2014-02-16 DIAGNOSIS — I10 Essential (primary) hypertension: Secondary | ICD-10-CM | POA: Diagnosis not present

## 2014-02-16 DIAGNOSIS — E668 Other obesity: Secondary | ICD-10-CM | POA: Diagnosis not present

## 2014-02-16 DIAGNOSIS — K219 Gastro-esophageal reflux disease without esophagitis: Secondary | ICD-10-CM | POA: Diagnosis not present

## 2014-02-16 DIAGNOSIS — E119 Type 2 diabetes mellitus without complications: Secondary | ICD-10-CM | POA: Diagnosis not present

## 2014-02-16 DIAGNOSIS — E785 Hyperlipidemia, unspecified: Secondary | ICD-10-CM

## 2014-02-16 DIAGNOSIS — E669 Obesity, unspecified: Secondary | ICD-10-CM

## 2014-02-16 DIAGNOSIS — Z6836 Body mass index (BMI) 36.0-36.9, adult: Secondary | ICD-10-CM | POA: Diagnosis not present

## 2014-02-16 DIAGNOSIS — E1169 Type 2 diabetes mellitus with other specified complication: Secondary | ICD-10-CM

## 2014-02-16 DIAGNOSIS — C679 Malignant neoplasm of bladder, unspecified: Secondary | ICD-10-CM

## 2014-02-16 NOTE — Progress Notes (Signed)
Pre visit review using our clinic review tool, if applicable. No additional management support is needed unless otherwise documented below in the visit note. 

## 2014-02-17 ENCOUNTER — Encounter: Payer: Self-pay | Admitting: Family Medicine

## 2014-02-17 LAB — HEMOGLOBIN A1C: HEMOGLOBIN A1C: 7 % — AB (ref 4.6–6.5)

## 2014-02-18 LAB — CBC WITH DIFFERENTIAL/PLATELET
Basophils Absolute: 0.1 10*3/uL (ref 0.0–0.1)
Basophils Relative: 1.1 % (ref 0.0–3.0)
EOS PCT: 2.7 % (ref 0.0–5.0)
Eosinophils Absolute: 0.2 10*3/uL (ref 0.0–0.7)
HCT: 44.3 % (ref 36.0–46.0)
Hemoglobin: 14.6 g/dL (ref 12.0–15.0)
Lymphocytes Relative: 26.5 % (ref 12.0–46.0)
Lymphs Abs: 1.6 10*3/uL (ref 0.7–4.0)
MCHC: 33 g/dL (ref 30.0–36.0)
MCV: 91.9 fl (ref 78.0–100.0)
Monocytes Absolute: 3.4 10*3/uL — ABNORMAL HIGH (ref 0.1–1.0)
Monocytes Relative: 54.5 % — ABNORMAL HIGH (ref 3.0–12.0)
NEUTROS PCT: 15.2 % — AB (ref 43.0–77.0)
Neutro Abs: 0.9 10*3/uL — ABNORMAL LOW (ref 1.4–7.7)
PLATELETS: 239 10*3/uL (ref 150.0–400.0)
RBC: 4.82 Mil/uL (ref 3.87–5.11)
RDW: 13.2 % (ref 11.5–15.5)
WBC: 6.2 10*3/uL (ref 4.0–10.5)

## 2014-02-18 LAB — HEPATIC FUNCTION PANEL
ALBUMIN: 4.6 g/dL (ref 3.5–5.2)
ALT: 16 U/L (ref 0–35)
AST: 21 U/L (ref 0–37)
Alkaline Phosphatase: 48 U/L (ref 39–117)
Bilirubin, Direct: 0 mg/dL (ref 0.0–0.3)
TOTAL PROTEIN: 7.2 g/dL (ref 6.0–8.3)
Total Bilirubin: 0.5 mg/dL (ref 0.2–1.2)

## 2014-02-18 LAB — RENAL FUNCTION PANEL
ALBUMIN: 4.6 g/dL (ref 3.5–5.2)
BUN: 15 mg/dL (ref 6–23)
CALCIUM: 9.7 mg/dL (ref 8.4–10.5)
CO2: 25 mEq/L (ref 19–32)
CREATININE: 0.6 mg/dL (ref 0.4–1.2)
Chloride: 103 mEq/L (ref 96–112)
GFR: 98.84 mL/min (ref >60.00)
GLUCOSE: 115 mg/dL — AB (ref 70–99)
Phosphorus: 4.1 mg/dL (ref 2.3–4.6)
Potassium: 4.4 mEq/L (ref 3.5–5.1)
SODIUM: 139 meq/L (ref 135–145)

## 2014-02-18 LAB — LIPID PANEL
CHOLESTEROL: 172 mg/dL (ref 0–200)
HDL: 63.9 mg/dL (ref >39.00)
LDL Cholesterol: 78 mg/dL (ref 0–99)
NonHDL: 108.1
Total CHOL/HDL Ratio: 3
Triglycerides: 149 mg/dL (ref 0.0–149.0)
VLDL: 29.8 mg/dL (ref 0.0–40.0)

## 2014-02-18 LAB — TSH: TSH: 1.4 u[IU]/mL (ref 0.35–4.50)

## 2014-02-22 ENCOUNTER — Other Ambulatory Visit: Payer: Self-pay | Admitting: Family Medicine

## 2014-02-25 ENCOUNTER — Encounter: Payer: Self-pay | Admitting: Family Medicine

## 2014-02-25 NOTE — Assessment & Plan Note (Signed)
Avoid offending foods, start probiotics. Do not eat large meals in late evening and consider raising head of bed.  

## 2014-02-25 NOTE — Assessment & Plan Note (Signed)
hgba1c acceptable, minimize simple carbs. Increase exercise as tolerated. Continue current meds 

## 2014-02-25 NOTE — Assessment & Plan Note (Signed)
Well controlled, no changes to meds. Encouraged heart healthy diet such as the DASH diet and exercise as tolerated.  °

## 2014-02-25 NOTE — Progress Notes (Signed)
Savannah Stanley  [id number] 04/03/1950 02/25/2014      Progress Note-Follow Up  Subjective  Chief Complaint  Chief Complaint  Patient presents with  . Follow-up    3 month    HPI  Patient is a 63 y.o. female in today for routine medical care. Doing well. No recent illness. No polyuria or polydipsia. Denies CP/palp/SOB/HA/congestion/fevers/GI or GU c/o. Taking meds as prescribed  Past Medical History  Diagnosis Date  . Hypertension   . GERD (gastroesophageal reflux disease)   . H/O hiatal hernia   . Type 2 diabetes mellitus   . Iron deficiency anemia   . Bladder cancer UROLOGIST-- DR Diona Fanti    HIGH-GRADE UROTHELIAL CARCINOMA  . Depression   . COPD (chronic obstructive pulmonary disease)     Past Surgical History  Procedure Laterality Date  . Transurethral resection of bladder tumor with gyrus (turbt-gyrus) N/A 01/23/2013    Procedure: TRANSURETHRAL RESECTION OF BLADDER TUMOR WITH GYRUS ;  Surgeon: Franchot Gallo, MD;  Location: [facility];  Service: Urology;  Laterality: N/A;  . Appendectomy  1980'S  . Tubal ligation  1980'S  . Transurethral resection of bladder tumor N/A 02/16/2013    Procedure: TRANSURETHRAL RESECTION OF BLADDER TUMOR (TURBT);  Surgeon: Franchot Gallo, MD;  Location: [facility];  Service: Urology;  Laterality: N/A;  . Transurethral resection of bladder tumor N/A 12/18/2013    Procedure: TRANSURETHRAL RESECTION OF BLADDER TUMOR (TURBT) ;  Surgeon: Jorja Loa, MD;  Location: [facility];  Service: Urology;  Laterality: N/A;  . Cystoscopy N/A 12/18/2013    Procedure: CYSTOSCOPY;  Surgeon: Jorja Loa, MD;  Location: [facility];  Service: Urology;  Laterality: N/A;    Family History  Problem Relation Age of Onset  . Cancer Father     prostate  . Arthritis Brother     History   Social History  . Marital Status: Widowed    Spouse Name: N/A    Number of Children: N/A  . Years of  Education: N/A   Occupational History  . Not on file.   Social History Main Topics  . Smoking status: Former Smoker -- 1.00 packs/day for 35 years    Types: Cigarettes    Quit date: 07/15/2010  . Smokeless tobacco: Never Used  . Alcohol Use: No  . Drug Use: No  . Sexual Activity: Not on file   Other Topics Concern  . Not on file   Social History Narrative    Current Outpatient Prescriptions on File Prior to Visit  Medication Sig Dispense Refill  . albuterol (PROVENTIL HFA;VENTOLIN HFA) 108 (90 BASE) MCG/ACT inhaler Inhale 2 puffs into the lungs every 6 (six) hours as needed for wheezing or shortness of breath. Please dispense ProAir 3 Inhaler 0  . Calcium Citrate-Vitamin D (CITRACAL + D PO) Take 2 tablets by mouth 2 (two) times daily.     . citalopram (CELEXA) 40 MG tablet TAKE 1 TABLET BY MOUTH DAILY 90 tablet 1  . Coenzyme Q10 (COQ10) 200 MG CAPS Take 1 capsule by mouth daily.    . ferrous sulfate (FEOSOL) 325 (65 FE) MG tablet Take 325 mg by mouth daily with breakfast.     . Fluticasone-Salmeterol (ADVAIR DISKUS) 250-50 MCG/DOSE AEPB Inhale 1 puff into the  lungs every 12 hours---   currently does only in am    . losartan (COZAAR) 50 MG tablet TAKE 1 TABLET BY MOUTH DAILY 30 tablet 3  . metFORMIN (  GLUCOPHAGE) 500 MG tablet TAKE 1 TABLET (500 MG TOTAL) BY MOUTH 2 (TWO) TIMES DAILY WITH A MEAL. 60 tablet 2  . Multiple Vitamins-Minerals (ZINC PO) Take 1 tablet by mouth daily.    Marland Kitchen omeprazole (PRILOSEC) 20 MG capsule Take 1 capsule by mouth two times daily 180 capsule 1  . pravastatin (PRAVACHOL) 10 MG tablet TAKE 1 TABLET BY MOUTH DAILY 30 tablet 3  . traMADol (ULTRAM) 50 MG tablet Take 1 tablet (50 mg total) by mouth every 6 (six) hours as needed. 20 tablet 0   Current Facility-Administered Medications on File Prior to Visit  Medication Dose Route Frequency Provider Last Rate Last Dose  . mitoMYcin (MUTAMYCIN) chemo injection 40 mg  40 mg Bladder Instillation Once Jorja Loa, MD        Allergies  Allergen Reactions  . Bee Venom Shortness Of Breath  . Other Shortness Of Breath    MSG.  . Latex Itching    Review of Systems  Review of Systems  Constitutional: Negative for fever and malaise/fatigue.  HENT: Negative for congestion.   Eyes: Negative for discharge.  Respiratory: Negative for shortness of breath.   Cardiovascular: Negative for chest pain, palpitations and leg swelling.  Gastrointestinal: Negative for nausea, abdominal pain and diarrhea.  Genitourinary: Negative for dysuria.  Musculoskeletal: Negative for falls.  Skin: Negative for rash.  Neurological: Negative for loss of consciousness and headaches.  Endo/Heme/Allergies: Negative for polydipsia.  Psychiatric/Behavioral: Negative for depression and suicidal ideas. The patient is not nervous/anxious and does not have insomnia.     Objective  BP 126/57 mmHg  Pulse 90  Temp(Src) 98.6 F (37 C) (Oral)  Ht 5\' 3"  (1.6 m)  Wt 204 lb 3.2 oz (92.625 kg)  BMI 36.18 kg/m2  SpO2 96%  Physical Exam  Physical Exam  Constitutional: She is oriented to person, place, and time and well-developed, well-nourished, and in no distress. No distress.  HENT:  Head: Normocephalic and atraumatic.  Eyes: Conjunctivae are normal.  Neck: Neck supple. No thyromegaly present.  Cardiovascular: Normal rate, regular rhythm and normal heart sounds.   No murmur heard. Pulmonary/Chest: Effort normal and breath sounds normal. She has no wheezes.  Abdominal: She exhibits no distension and no mass.  Musculoskeletal: She exhibits no edema.  Lymphadenopathy:    She has no cervical adenopathy.  Neurological: She is alert and oriented to person, place, and time.  Skin: Skin is warm and dry. No rash noted. She is not diaphoretic.  Psychiatric: Memory, affect and judgment normal.    Lab Results  Component Value Date   TSH 1.40 02/16/2014   Lab Results  Component Value Date   WBC 6.2 02/16/2014   HGB  14.6 02/16/2014   HCT 44.3 02/16/2014   MCV 91.9 02/16/2014   PLT 239.0 02/16/2014   Lab Results  Component Value Date   CREATININE 0.6 02/16/2014   BUN 15 02/16/2014   NA 139 02/16/2014   K 4.4 02/16/2014   CL 103 02/16/2014   CO2 25 02/16/2014   Lab Results  Component Value Date   ALT 16 02/16/2014   AST 21 02/16/2014   ALKPHOS 48 02/16/2014   BILITOT 0.5 02/16/2014   Lab Results  Component Value Date   CHOL 172 02/16/2014   Lab Results  Component Value Date   HDL 63.90 02/16/2014   Lab Results  Component Value Date   LDLCALC 78 02/16/2014   Lab Results  Component Value Date   TRIG 149.0  02/16/2014   Lab Results  Component Value Date   CHOLHDL 3 02/16/2014     Assessment & Plan  Hypertension Well controlled, no changes to meds. Encouraged heart healthy diet such as the DASH diet and exercise as tolerated.   Diabetes mellitus type 2 in obese hgba1c acceptable, minimize simple carbs. Increase exercise as tolerated. Continue current meds  Esophageal reflux Avoid offending foods, start probiotics. Do not eat large meals in late evening and consider raising head of bed.   Hyperlipidemia Tolerating statin, encouraged heart healthy diet, avoid trans fats, minimize simple carbs and saturated fats. Increase exercise as tolerated  Malignant neoplasm of bladder Doing well, no recent concerns

## 2014-02-25 NOTE — Assessment & Plan Note (Signed)
Tolerating statin, encouraged heart healthy diet, avoid trans fats, minimize simple carbs and saturated fats. Increase exercise as tolerated 

## 2014-02-25 NOTE — Assessment & Plan Note (Signed)
Doing well, no recent concerns

## 2014-03-23 DIAGNOSIS — C672 Malignant neoplasm of lateral wall of bladder: Secondary | ICD-10-CM | POA: Diagnosis not present

## 2014-03-27 ENCOUNTER — Telehealth: Payer: Self-pay | Admitting: Family Medicine

## 2014-03-27 NOTE — Telephone Encounter (Signed)
Caller name:Jessica Relation to HQ:IONG Call back number:[phone] Pharmacy:[facility]    Reason for call: needing verification that the rx was received for pt, faxed lastnight and today. rx for test strips.

## 2014-03-29 NOTE — Telephone Encounter (Signed)
Medicare Part B Info needed for Billing sheet for Prodoigy lancets was filled out and faxed to the pharmacy.//AB/CMA

## 2014-04-04 DIAGNOSIS — C679 Malignant neoplasm of bladder, unspecified: Secondary | ICD-10-CM | POA: Diagnosis not present

## 2014-04-04 DIAGNOSIS — Z5111 Encounter for antineoplastic chemotherapy: Secondary | ICD-10-CM | POA: Diagnosis not present

## 2014-04-11 DIAGNOSIS — C679 Malignant neoplasm of bladder, unspecified: Secondary | ICD-10-CM | POA: Diagnosis not present

## 2014-04-11 DIAGNOSIS — Z5111 Encounter for antineoplastic chemotherapy: Secondary | ICD-10-CM | POA: Diagnosis not present

## 2014-04-18 DIAGNOSIS — C672 Malignant neoplasm of lateral wall of bladder: Secondary | ICD-10-CM | POA: Diagnosis not present

## 2014-04-18 DIAGNOSIS — Z5111 Encounter for antineoplastic chemotherapy: Secondary | ICD-10-CM | POA: Diagnosis not present

## 2014-05-09 ENCOUNTER — Other Ambulatory Visit: Payer: Self-pay | Admitting: Family Medicine

## 2014-05-11 ENCOUNTER — Other Ambulatory Visit (INDEPENDENT_AMBULATORY_CARE_PROVIDER_SITE_OTHER): Payer: Medicare Other

## 2014-05-11 DIAGNOSIS — E119 Type 2 diabetes mellitus without complications: Secondary | ICD-10-CM | POA: Diagnosis not present

## 2014-05-11 DIAGNOSIS — I10 Essential (primary) hypertension: Secondary | ICD-10-CM | POA: Diagnosis not present

## 2014-05-11 DIAGNOSIS — E1169 Type 2 diabetes mellitus with other specified complication: Secondary | ICD-10-CM

## 2014-05-11 DIAGNOSIS — E669 Obesity, unspecified: Secondary | ICD-10-CM | POA: Diagnosis not present

## 2014-05-11 DIAGNOSIS — E785 Hyperlipidemia, unspecified: Secondary | ICD-10-CM | POA: Diagnosis not present

## 2014-05-11 LAB — LIPID PANEL
CHOL/HDL RATIO: 3
Cholesterol: 162 mg/dL (ref 0–200)
HDL: 62.2 mg/dL (ref >39.00)
LDL CALC: 79 mg/dL (ref 0–99)
NonHDL: 99.8
TRIGLYCERIDES: 106 mg/dL (ref 0.0–149.0)
VLDL: 21.2 mg/dL (ref 0.0–40.0)

## 2014-05-11 LAB — RENAL FUNCTION PANEL
ALBUMIN: 4.3 g/dL (ref 3.5–5.2)
BUN: 15 mg/dL (ref 6–23)
CHLORIDE: 99 meq/L (ref 96–112)
CO2: 32 mEq/L (ref 19–32)
CREATININE: 0.71 mg/dL (ref 0.40–1.20)
Calcium: 9.6 mg/dL (ref 8.4–10.5)
GFR: 87.62 mL/min (ref >60.00)
Glucose, Bld: 168 mg/dL — ABNORMAL HIGH (ref 70–99)
Phosphorus: 4.2 mg/dL (ref 2.3–4.6)
Potassium: 4.9 mEq/L (ref 3.5–5.1)
Sodium: 137 mEq/L (ref 135–145)

## 2014-05-11 LAB — CBC
HCT: 41.1 % (ref 36.0–46.0)
Hemoglobin: 13.7 g/dL (ref 12.0–15.0)
MCHC: 33.3 g/dL (ref 30.0–36.0)
MCV: 90 fl (ref 78.0–100.0)
PLATELETS: 224 10*3/uL (ref 150.0–400.0)
RBC: 4.57 Mil/uL (ref 3.87–5.11)
RDW: 13.7 % (ref 11.5–15.5)
WBC: 7.7 10*3/uL (ref 4.0–10.5)

## 2014-05-11 LAB — HEPATIC FUNCTION PANEL
ALBUMIN: 4.3 g/dL (ref 3.5–5.2)
ALT: 12 U/L (ref 0–35)
AST: 14 U/L (ref 0–37)
Alkaline Phosphatase: 49 U/L (ref 39–117)
Bilirubin, Direct: 0.1 mg/dL (ref 0.0–0.3)
Total Bilirubin: 0.4 mg/dL (ref 0.2–1.2)
Total Protein: 6.8 g/dL (ref 6.0–8.3)

## 2014-05-11 LAB — HEMOGLOBIN A1C: HEMOGLOBIN A1C: 6.7 % — AB (ref 4.6–6.5)

## 2014-05-11 LAB — TSH: TSH: 0.48 u[IU]/mL (ref 0.35–4.50)

## 2014-05-11 NOTE — Addendum Note (Signed)
Addended by: Ewing Schlein on: 05/11/2014 12:23 PM   Modules accepted: Orders

## 2014-05-18 ENCOUNTER — Encounter: Payer: Self-pay | Admitting: Family Medicine

## 2014-05-18 ENCOUNTER — Ambulatory Visit (INDEPENDENT_AMBULATORY_CARE_PROVIDER_SITE_OTHER): Payer: Medicare Other | Admitting: Family Medicine

## 2014-05-18 VITALS — BP 118/70 | HR 107 | Temp 97.6°F | Ht 63.0 in | Wt 201.5 lb

## 2014-05-18 DIAGNOSIS — E119 Type 2 diabetes mellitus without complications: Secondary | ICD-10-CM | POA: Diagnosis not present

## 2014-05-18 DIAGNOSIS — R000 Tachycardia, unspecified: Secondary | ICD-10-CM | POA: Diagnosis not present

## 2014-05-18 DIAGNOSIS — J449 Chronic obstructive pulmonary disease, unspecified: Secondary | ICD-10-CM | POA: Diagnosis not present

## 2014-05-18 DIAGNOSIS — I10 Essential (primary) hypertension: Secondary | ICD-10-CM

## 2014-05-18 DIAGNOSIS — K219 Gastro-esophageal reflux disease without esophagitis: Secondary | ICD-10-CM | POA: Diagnosis not present

## 2014-05-18 DIAGNOSIS — R011 Cardiac murmur, unspecified: Secondary | ICD-10-CM | POA: Diagnosis not present

## 2014-05-18 DIAGNOSIS — C680 Malignant neoplasm of urethra: Secondary | ICD-10-CM

## 2014-05-18 DIAGNOSIS — E782 Mixed hyperlipidemia: Secondary | ICD-10-CM

## 2014-05-18 DIAGNOSIS — E669 Obesity, unspecified: Secondary | ICD-10-CM | POA: Diagnosis not present

## 2014-05-18 DIAGNOSIS — E1169 Type 2 diabetes mellitus with other specified complication: Secondary | ICD-10-CM

## 2014-05-18 MED ORDER — METOPROLOL SUCCINATE ER 25 MG PO TB24: 25.0000 mg | ORAL_TABLET | Freq: Every day | ORAL | Status: DC

## 2014-05-18 NOTE — Progress Notes (Signed)
Pre visit review using our clinic review tool, if applicable. No additional management support is needed unless otherwise documented below in the visit note. 

## 2014-05-18 NOTE — Patient Instructions (Addendum)
Needs BP check with nursing in 1 month  Basic Carbohydrate Counting for Diabetes Mellitus Carbohydrate counting is a method for keeping track of the amount of carbohydrates you eat. Eating carbohydrates naturally increases the level of sugar (glucose) in your blood, so it is important for you to know the amount that is okay for you to have in every meal. Carbohydrate counting helps keep the level of glucose in your blood within normal limits. The amount of carbohydrates allowed is different for every person. A dietitian can help you calculate the amount that is right for you. Once you know the amount of carbohydrates you can have, you can count the carbohydrates in the foods you want to eat. Carbohydrates are found in the following foods:  Grains, such as breads and cereals.  Dried beans and soy products.  Starchy vegetables, such as potatoes, peas, and corn.  Fruit and fruit juices.  Milk and yogurt.  Sweets and snack foods, such as cake, cookies, candy, chips, soft drinks, and fruit drinks. CARBOHYDRATE COUNTING There are two ways to count the carbohydrates in your food. You can use either of the methods or a combination of both. Reading the "Nutrition Facts" on [place] The "Nutrition Facts" is an area that is included on the labels of almost all packaged food and beverages in the [place]. It includes the serving size of that food or beverage and information about the nutrients in each serving of the food, including the grams (g) of carbohydrate per serving.  Decide the number of servings of this food or beverage that you will be able to eat or drink. Multiply that number of servings by the number of grams of carbohydrate that is listed on the label for that serving. The total will be the amount of carbohydrates you will be having when you eat or drink this food or beverage. Learning Standard Serving Sizes of Food When you eat food that is not packaged or does not include  "Nutrition Facts" on the label, you need to measure the servings in order to count the amount of carbohydrates.A serving of most carbohydrate-rich foods contains about 15 g of carbohydrates. The following list includes serving sizes of carbohydrate-rich foods that provide 15 g ofcarbohydrate per serving:   1 slice of bread (1 oz) or 1 six-inch tortilla.    of a hamburger bun or English muffin.  4-6 crackers.   cup unsweetened dry cereal.    cup hot cereal.   cup rice or pasta.    cup mashed potatoes or  of a large baked potato.  1 cup fresh fruit or one small piece of fruit.    cup canned or frozen fruit or fruit juice.  1 cup milk.   cup plain fat-free yogurt or yogurt sweetened with artificial sweeteners.   cup cooked dried beans or starchy vegetable, such as peas, corn, or potatoes.  Decide the number of standard-size servings that you will eat. Multiply that number of servings by 15 (the grams of carbohydrates in that serving). For example, if you eat 2 cups of strawberries, you will have eaten 2 servings and 30 g of carbohydrates (2 servings x 15 g = 30 g). For foods such as soups and casseroles, in which more than one food is mixed in, you will need to count the carbohydrates in each food that is included. EXAMPLE OF CARBOHYDRATE COUNTING Sample Dinner  3 oz chicken breast.   cup of brown rice.   cup of corn.  1 cup milk.   1 cup strawberries with sugar-free whipped topping.  Carbohydrate Calculation Step 1: Identify the foods that contain carbohydrates:   Rice.   Corn.   Milk.   Strawberries. Step 2:Calculate the number of servings eaten of each:   2 servings of rice.   1 serving of corn.   1 serving of milk.   1 serving of strawberries. Step 3: Multiply each of those number of servings by 15 g:   2 servings of rice x 15 g = 30 g.   1 serving of corn x 15 g = 15 g.   1 serving of milk x 15 g = 15 g.   1 serving of  strawberries x 15 g = 15 g. Step 4: Add together all of the amounts to find the total grams of carbohydrates eaten: 30 g + 15 g + 15 g + 15 g = 75 g. Document Released: 03/02/2005 Document Revised: 07/17/2013 Document Reviewed: 01/27/2013 [facility] Patient Information 2015 [place], [place]. This information is not intended to replace advice given to you by your health care provider. Make sure you discuss any questions you have with your health care provider.

## 2014-05-20 ENCOUNTER — Encounter: Payer: Self-pay | Admitting: Family Medicine

## 2014-05-20 DIAGNOSIS — R011 Cardiac murmur, unspecified: Secondary | ICD-10-CM

## 2014-05-20 HISTORY — DX: Cardiac murmur, unspecified: R01.1

## 2014-05-20 NOTE — Assessment & Plan Note (Signed)
New onset, declines referral for echo at this time, will let us know when she is ready to proceed

## 2014-05-20 NOTE — Assessment & Plan Note (Signed)
hgba1c acceptable, minimize simple carbs. Increase exercise as tolerated. Continue current meds 

## 2014-05-20 NOTE — Assessment & Plan Note (Signed)
Has repeat cystoscopy next month doing well.

## 2014-05-20 NOTE — Assessment & Plan Note (Signed)
Well controlled, no changes to meds. Encouraged heart healthy diet such as the DASH diet and exercise as tolerated.  °

## 2014-05-20 NOTE — Assessment & Plan Note (Signed)
No recent exacerbations.  

## 2014-05-20 NOTE — Progress Notes (Signed)
Savannah Stanley  [id number] 1950/04/15 05/20/2014      Progress Note-Follow Up  Subjective  Chief Complaint  Chief Complaint  Patient presents with  . Follow-up    HPI  Patient is a 64 y.o. female in today for routine medical care. Patient is in today for routine follow up. Feeling well. NO recent illness or acute concern. Quit smoking roughly 4 years ago. Finger stick glucose was 223 this morning. Denies polyuria or polydipsia. Denies CP/palp/SOB/HA/congestion/fevers/GI or GU c/o. Taking meds as prescribed  Past Medical History  Diagnosis Date  . Hypertension   . GERD (gastroesophageal reflux disease)   . H/O hiatal hernia   . Type 2 diabetes mellitus   . Iron deficiency anemia   . Bladder cancer UROLOGIST-- DR Diona Fanti    HIGH-GRADE UROTHELIAL CARCINOMA  . Depression   . COPD (chronic obstructive pulmonary disease)     Past Surgical History  Procedure Laterality Date  . Transurethral resection of bladder tumor with gyrus (turbt-gyrus) N/A 01/23/2013    Procedure: TRANSURETHRAL RESECTION OF BLADDER TUMOR WITH GYRUS ;  Surgeon: Franchot Gallo, MD;  Location: [facility];  Service: Urology;  Laterality: N/A;  . Appendectomy  1980'S  . Tubal ligation  1980'S  . Transurethral resection of bladder tumor N/A 02/16/2013    Procedure: TRANSURETHRAL RESECTION OF BLADDER TUMOR (TURBT);  Surgeon: Franchot Gallo, MD;  Location: [facility];  Service: Urology;  Laterality: N/A;  . Transurethral resection of bladder tumor N/A 12/18/2013    Procedure: TRANSURETHRAL RESECTION OF BLADDER TUMOR (TURBT) ;  Surgeon: Jorja Loa, MD;  Location: [facility];  Service: Urology;  Laterality: N/A;  . Cystoscopy N/A 12/18/2013    Procedure: CYSTOSCOPY;  Surgeon: Jorja Loa, MD;  Location: [facility];  Service: Urology;  Laterality: N/A;    Family History  Problem Relation Age of Onset  . Cancer Father     prostate  . Arthritis  Brother     History   Social History  . Marital Status: Widowed    Spouse Name: N/A  . Number of Children: N/A  . Years of Education: N/A   Occupational History  . Not on file.   Social History Main Topics  . Smoking status: Former Smoker -- 1.00 packs/day for 35 years    Types: Cigarettes    Quit date: 07/15/2010  . Smokeless tobacco: Never Used  . Alcohol Use: No  . Drug Use: No  . Sexual Activity: Not on file   Other Topics Concern  . Not on file   Social History Narrative    Current Outpatient Prescriptions on File Prior to Visit  Medication Sig Dispense Refill  . Calcium Citrate-Vitamin D (CITRACAL + D PO) Take 2 tablets by mouth 2 (two) times daily.     . citalopram (CELEXA) 40 MG tablet TAKE 1 TABLET BY MOUTH DAILY 90 tablet 1  . Coenzyme Q10 (COQ10) 200 MG CAPS Take 1 capsule by mouth daily.    . ferrous sulfate (FEOSOL) 325 (65 FE) MG tablet Take 325 mg by mouth daily with breakfast.     . Fluticasone-Salmeterol (ADVAIR DISKUS) 250-50 MCG/DOSE AEPB Inhale 1 puff into the  lungs every 12 hours---   currently does only in am    . losartan (COZAAR) 50 MG tablet TAKE 1 TABLET BY MOUTH DAILY 30 tablet 2  . metFORMIN (GLUCOPHAGE) 500 MG tablet TAKE 1 TABLET (500 MG TOTAL) BY MOUTH 2 (TWO) TIMES DAILY WITH  A MEAL. 60 tablet 1  . Multiple Vitamins-Minerals (ZINC PO) Take 1 tablet by mouth daily.    Marland Kitchen omeprazole (PRILOSEC) 20 MG capsule Take 1 capsule by mouth two times daily 180 capsule 1  . pravastatin (PRAVACHOL) 10 MG tablet TAKE 1 TABLET BY MOUTH DAILY 30 tablet 3  . albuterol (PROVENTIL HFA;VENTOLIN HFA) 108 (90 BASE) MCG/ACT inhaler Inhale 2 puffs into the lungs every 6 (six) hours as needed for wheezing or shortness of breath. Please dispense ProAir (Patient not taking: Reported on 05/18/2014) 3 Inhaler 0   Current Facility-Administered Medications on File Prior to Visit  Medication Dose Route Frequency Provider Last Rate Last Dose  . mitoMYcin (MUTAMYCIN) chemo  injection 40 mg  40 mg Bladder Instillation Once Jorja Loa, MD        Allergies  Allergen Reactions  . Bee Venom Shortness Of Breath  . Other Shortness Of Breath    MSG.  . Latex Itching    Review of Systems  Review of Systems  Constitutional: Negative for fever and malaise/fatigue.  HENT: Negative for congestion.   Eyes: Negative for discharge.  Respiratory: Negative for shortness of breath.   Cardiovascular: Negative for chest pain, palpitations and leg swelling.  Gastrointestinal: Negative for nausea, abdominal pain and diarrhea.  Genitourinary: Negative for dysuria.  Musculoskeletal: Negative for falls.  Skin: Negative for rash.  Neurological: Negative for loss of consciousness and headaches.  Endo/Heme/Allergies: Negative for polydipsia.  Psychiatric/Behavioral: Negative for depression and suicidal ideas. The patient is not nervous/anxious and does not have insomnia.     Objective  BP 118/70 mmHg  Pulse 107  Temp(Src) 97.6 F (36.4 C) (Oral)  Ht 5\' 3"  (1.6 m)  Wt 201 lb 8 oz (91.4 kg)  BMI 35.70 kg/m2  SpO2 97%  Physical Exam  Physical Exam  Constitutional: She is oriented to person, place, and time and well-developed, well-nourished, and in no distress. No distress.  HENT:  Head: Normocephalic and atraumatic.  Eyes: Conjunctivae are normal.  Neck: Neck supple. No thyromegaly present.  Cardiovascular: Normal rate and regular rhythm.   Murmur heard. Pulmonary/Chest: Effort normal and breath sounds normal. She has no wheezes.  Abdominal: She exhibits no distension and no mass.  Musculoskeletal: She exhibits no edema.  Lymphadenopathy:    She has no cervical adenopathy.  Neurological: She is alert and oriented to person, place, and time.  Skin: Skin is warm and dry. No rash noted. She is not diaphoretic.  Psychiatric: Memory, affect and judgment normal.    Lab Results  Component Value Date   TSH 0.48 05/11/2014   Lab Results  Component Value  Date   WBC 7.7 05/11/2014   HGB 13.7 05/11/2014   HCT 41.1 05/11/2014   MCV 90.0 05/11/2014   PLT 224.0 05/11/2014   Lab Results  Component Value Date   CREATININE 0.71 05/11/2014   BUN 15 05/11/2014   NA 137 05/11/2014   K 4.9 05/11/2014   CL 99 05/11/2014   CO2 32 05/11/2014   Lab Results  Component Value Date   ALT 12 05/11/2014   AST 14 05/11/2014   ALKPHOS 49 05/11/2014   BILITOT 0.4 05/11/2014   Lab Results  Component Value Date   CHOL 162 05/11/2014   Lab Results  Component Value Date   HDL 62.20 05/11/2014   Lab Results  Component Value Date   LDLCALC 79 05/11/2014   Lab Results  Component Value Date   TRIG 106.0 05/11/2014   Lab  Results  Component Value Date   CHOLHDL 3 05/11/2014     Assessment & Plan  Hypertension Well controlled, no changes to meds. Encouraged heart healthy diet such as the DASH diet and exercise as tolerated.    Esophageal reflux Avoid offending foods, start probiotics. Do not eat large meals in late evening and consider raising head of bed.    COPD (chronic obstructive pulmonary disease) No recent exacerbations.   Diabetes mellitus type 2 in obese hgba1c acceptable, minimize simple carbs. Increase exercise as tolerated. Continue current meds   Urethral carcinoma Has repeat cystoscopy next month doing well.   Murmur, heart New onset, declines referral for echo at this time, will let us know when she is ready to proceed   Tachycardia Started on Metoprolol XL 25 mg daily

## 2014-05-20 NOTE — Assessment & Plan Note (Signed)
Avoid offending foods, start probiotics. Do not eat large meals in late evening and consider raising head of bed.  

## 2014-05-20 NOTE — Assessment & Plan Note (Signed)
Started on Metoprolol XL 25 mg daily

## 2014-06-22 DIAGNOSIS — C672 Malignant neoplasm of lateral wall of bladder: Secondary | ICD-10-CM | POA: Diagnosis not present

## 2014-06-26 ENCOUNTER — Other Ambulatory Visit: Payer: Self-pay | Admitting: Family Medicine

## 2014-07-03 ENCOUNTER — Telehealth: Payer: Self-pay | Admitting: *Deleted

## 2014-07-03 ENCOUNTER — Ambulatory Visit: Payer: Medicare Other | Admitting: *Deleted

## 2014-07-03 VITALS — BP 142/78 | HR 81

## 2014-07-03 DIAGNOSIS — I10 Essential (primary) hypertension: Secondary | ICD-10-CM

## 2014-07-03 NOTE — Telephone Encounter (Signed)
perfect

## 2014-07-03 NOTE — Telephone Encounter (Addendum)
FYI- Patient's BP was 142/78 with HR 81.  Patient has not taken BP medications  yet this morning and she states systolic has been 008'Q at home.

## 2014-07-03 NOTE — Progress Notes (Signed)
Pre visit review using our clinic review tool, if applicable. No additional management support is needed unless otherwise documented below in the visit note. 

## 2014-07-05 ENCOUNTER — Other Ambulatory Visit: Payer: Self-pay | Admitting: Family Medicine

## 2014-07-09 ENCOUNTER — Other Ambulatory Visit: Payer: Self-pay | Admitting: Family Medicine

## 2014-07-24 DIAGNOSIS — C672 Malignant neoplasm of lateral wall of bladder: Secondary | ICD-10-CM | POA: Diagnosis not present

## 2014-07-24 DIAGNOSIS — Z5111 Encounter for antineoplastic chemotherapy: Secondary | ICD-10-CM | POA: Diagnosis not present

## 2014-07-31 DIAGNOSIS — Z5111 Encounter for antineoplastic chemotherapy: Secondary | ICD-10-CM | POA: Diagnosis not present

## 2014-07-31 DIAGNOSIS — R310 Gross hematuria: Secondary | ICD-10-CM | POA: Diagnosis not present

## 2014-07-31 DIAGNOSIS — C672 Malignant neoplasm of lateral wall of bladder: Secondary | ICD-10-CM | POA: Diagnosis not present

## 2014-08-03 ENCOUNTER — Other Ambulatory Visit: Payer: Self-pay | Admitting: Family Medicine

## 2014-08-07 DIAGNOSIS — R300 Dysuria: Secondary | ICD-10-CM | POA: Diagnosis not present

## 2014-08-07 DIAGNOSIS — C672 Malignant neoplasm of lateral wall of bladder: Secondary | ICD-10-CM | POA: Diagnosis not present

## 2014-08-15 DIAGNOSIS — C672 Malignant neoplasm of lateral wall of bladder: Secondary | ICD-10-CM | POA: Diagnosis not present

## 2014-08-15 DIAGNOSIS — Z5111 Encounter for antineoplastic chemotherapy: Secondary | ICD-10-CM | POA: Diagnosis not present

## 2014-08-17 ENCOUNTER — Other Ambulatory Visit (INDEPENDENT_AMBULATORY_CARE_PROVIDER_SITE_OTHER): Payer: Medicare Other

## 2014-08-17 ENCOUNTER — Telehealth: Payer: Self-pay | Admitting: Family Medicine

## 2014-08-17 DIAGNOSIS — E669 Obesity, unspecified: Secondary | ICD-10-CM

## 2014-08-17 DIAGNOSIS — I10 Essential (primary) hypertension: Secondary | ICD-10-CM | POA: Diagnosis not present

## 2014-08-17 DIAGNOSIS — E782 Mixed hyperlipidemia: Secondary | ICD-10-CM | POA: Diagnosis not present

## 2014-08-17 DIAGNOSIS — E119 Type 2 diabetes mellitus without complications: Secondary | ICD-10-CM | POA: Diagnosis not present

## 2014-08-17 DIAGNOSIS — E1169 Type 2 diabetes mellitus with other specified complication: Secondary | ICD-10-CM

## 2014-08-17 LAB — TSH: TSH: 1.25 u[IU]/mL (ref 0.35–4.50)

## 2014-08-17 LAB — CBC
HEMATOCRIT: 40 % (ref 36.0–46.0)
Hemoglobin: 13.3 g/dL (ref 12.0–15.0)
MCHC: 33.3 g/dL (ref 30.0–36.0)
MCV: 90.6 fl (ref 78.0–100.0)
Platelets: 174 10*3/uL (ref 150.0–400.0)
RBC: 4.42 Mil/uL (ref 3.87–5.11)
RDW: 13.3 % (ref 11.5–15.5)
WBC: 7.7 10*3/uL (ref 4.0–10.5)

## 2014-08-17 LAB — LIPID PANEL
Cholesterol: 156 mg/dL (ref 0–200)
HDL: 58.6 mg/dL (ref >39.00)
LDL CALC: 73 mg/dL (ref 0–99)
NonHDL: 97.4
TRIGLYCERIDES: 120 mg/dL (ref 0.0–149.0)
Total CHOL/HDL Ratio: 3
VLDL: 24 mg/dL (ref 0.0–40.0)

## 2014-08-17 LAB — HEMOGLOBIN A1C: HEMOGLOBIN A1C: 6.5 % (ref 4.6–6.5)

## 2014-08-17 LAB — COMPREHENSIVE METABOLIC PANEL
ALT: 9 U/L (ref 0–35)
AST: 12 U/L (ref 0–37)
Albumin: 4.2 g/dL (ref 3.5–5.2)
Alkaline Phosphatase: 46 U/L (ref 39–117)
BUN: 14 mg/dL (ref 6–23)
CO2: 30 mEq/L (ref 19–32)
CREATININE: 0.69 mg/dL (ref 0.40–1.20)
Calcium: 9.3 mg/dL (ref 8.4–10.5)
Chloride: 100 mEq/L (ref 96–112)
GFR: 90.48 mL/min (ref >60.00)
Glucose, Bld: 133 mg/dL — ABNORMAL HIGH (ref 70–99)
Potassium: 4.3 mEq/L (ref 3.5–5.1)
Sodium: 138 mEq/L (ref 135–145)
Total Bilirubin: 0.4 mg/dL (ref 0.2–1.2)
Total Protein: 6.6 g/dL (ref 6.0–8.3)

## 2014-08-17 MED ORDER — EPINEPHRINE 0.3 MG/0.3ML IJ SOAJ: 0.3000 mg | Freq: Once | INTRAMUSCULAR | Status: DC

## 2014-08-17 NOTE — Telephone Encounter (Signed)
OK to send in rx for Epipen pak use as directed. Disp #1 pak (2devices) with 1 rf

## 2014-08-17 NOTE — Telephone Encounter (Signed)
Patient informed epipen sent in

## 2014-08-17 NOTE — Telephone Encounter (Signed)
Caller name:Angelys  Relationship to patient:self  Can be reached: Pharmacy:[facility] [address]   Reason for call:she has lots of bees around her house this year  Would you call her in an eppi pen

## 2014-08-23 ENCOUNTER — Ambulatory Visit: Payer: Medicare Other | Admitting: Family Medicine

## 2014-08-23 ENCOUNTER — Encounter: Payer: Self-pay | Admitting: Family Medicine

## 2014-08-23 ENCOUNTER — Ambulatory Visit (INDEPENDENT_AMBULATORY_CARE_PROVIDER_SITE_OTHER): Payer: Medicare Other | Admitting: Family Medicine

## 2014-08-23 VITALS — BP 132/80 | HR 88 | Temp 98.3°F | Ht 63.0 in | Wt 201.5 lb

## 2014-08-23 DIAGNOSIS — E785 Hyperlipidemia, unspecified: Secondary | ICD-10-CM | POA: Diagnosis not present

## 2014-08-23 DIAGNOSIS — E119 Type 2 diabetes mellitus without complications: Secondary | ICD-10-CM

## 2014-08-23 DIAGNOSIS — K219 Gastro-esophageal reflux disease without esophagitis: Secondary | ICD-10-CM | POA: Diagnosis not present

## 2014-08-23 DIAGNOSIS — C679 Malignant neoplasm of bladder, unspecified: Secondary | ICD-10-CM

## 2014-08-23 DIAGNOSIS — C680 Malignant neoplasm of urethra: Secondary | ICD-10-CM

## 2014-08-23 DIAGNOSIS — E1169 Type 2 diabetes mellitus with other specified complication: Secondary | ICD-10-CM

## 2014-08-23 DIAGNOSIS — I10 Essential (primary) hypertension: Secondary | ICD-10-CM

## 2014-08-23 DIAGNOSIS — D649 Anemia, unspecified: Secondary | ICD-10-CM | POA: Diagnosis not present

## 2014-08-23 DIAGNOSIS — E669 Obesity, unspecified: Secondary | ICD-10-CM

## 2014-08-23 NOTE — Progress Notes (Signed)
Pre visit review using our clinic review tool, if applicable. No additional management support is needed unless otherwise documented below in the visit note. 

## 2014-08-23 NOTE — Patient Instructions (Signed)

## 2014-08-23 NOTE — Assessment & Plan Note (Signed)
Tolerating statin, encouraged heart healthy diet, avoid trans fats, minimize simple carbs and saturated fats. Increase exercise as tolerated 

## 2014-08-23 NOTE — Assessment & Plan Note (Signed)
Well controlled, no changes to meds. Encouraged heart healthy diet such as the DASH diet and exercise as tolerated.  °

## 2014-08-23 NOTE — Assessment & Plan Note (Signed)
Avoid offending foods, start probiotics. Do not eat large meals in late evening and consider raising head of bed.  

## 2014-08-23 NOTE — Progress Notes (Signed)
Savannah Stanley  [id number] 27-Oct-1950 08/23/2014      Progress Note-Follow Up  Subjective  Chief Complaint  Chief Complaint  Patient presents with  . Follow-up    blood pressure medication    HPI  Patient is a 64 y.o. female in today for routine medical care. Patient is in today for follow-up on her blood pressure generally doing well but is struggling with stress and discomfort secondary to her urethral cancer treatments. She notes some dysuria and urgency have developed. She denies fevers, chills, flank pain or other concerning symptoms. Otherwise she reports her appetite is good and she is feeling well. Denies CP/palp/SOB/HA/congestion/fevers/GI c/o. Taking meds as prescribed  Past Medical History  Diagnosis Date  . Hypertension   . GERD (gastroesophageal reflux disease)   . H/O hiatal hernia   . Type 2 diabetes mellitus   . Iron deficiency anemia   . Bladder cancer UROLOGIST-- DR Diona Fanti    HIGH-GRADE UROTHELIAL CARCINOMA  . Depression   . COPD (chronic obstructive pulmonary disease)   . Murmur, heart 05/20/2014    Past Surgical History  Procedure Laterality Date  . Transurethral resection of bladder tumor with gyrus (turbt-gyrus) N/A 01/23/2013    Procedure: TRANSURETHRAL RESECTION OF BLADDER TUMOR WITH GYRUS ;  Surgeon: Franchot Gallo, MD;  Location: [facility];  Service: Urology;  Laterality: N/A;  . Appendectomy  1980'S  . Tubal ligation  1980'S  . Transurethral resection of bladder tumor N/A 02/16/2013    Procedure: TRANSURETHRAL RESECTION OF BLADDER TUMOR (TURBT);  Surgeon: Franchot Gallo, MD;  Location: [facility];  Service: Urology;  Laterality: N/A;  . Transurethral resection of bladder tumor N/A 12/18/2013    Procedure: TRANSURETHRAL RESECTION OF BLADDER TUMOR (TURBT) ;  Surgeon: Jorja Loa, MD;  Location: [facility];  Service: Urology;  Laterality: N/A;  . Cystoscopy N/A 12/18/2013    Procedure: CYSTOSCOPY;  Surgeon:  Jorja Loa, MD;  Location: [facility];  Service: Urology;  Laterality: N/A;    Family History  Problem Relation Age of Onset  . Cancer Father     prostate  . Arthritis Brother     History   Social History  . Marital Status: Widowed    Spouse Name: N/A  . Number of Children: N/A  . Years of Education: N/A   Occupational History  . Not on file.   Social History Main Topics  . Smoking status: Former Smoker -- 1.00 packs/day for 35 years    Types: Cigarettes    Quit date: 07/15/2010  . Smokeless tobacco: Never Used  . Alcohol Use: No  . Drug Use: No  . Sexual Activity: Not on file   Other Topics Concern  . Not on file   Social History Narrative    Current Outpatient Prescriptions on File Prior to Visit  Medication Sig Dispense Refill  . albuterol (PROVENTIL HFA;VENTOLIN HFA) 108 (90 BASE) MCG/ACT inhaler Inhale 2 puffs into the lungs every 6 (six) hours as needed for wheezing or shortness of breath. Please dispense ProAir 3 Inhaler 0  . Calcium Citrate-Vitamin D (CITRACAL + D PO) Take 2 tablets by mouth 2 (two) times daily.     . citalopram (CELEXA) 40 MG tablet TAKE 1 TABLET BY MOUTH DAILY 90 tablet 0  . Coenzyme Q10 (COQ10) 200 MG CAPS Take 1 capsule by mouth daily.    . ferrous sulfate (FEOSOL) 325 (65 FE) MG tablet Take 325 mg by mouth daily with  breakfast.     . Fluticasone-Salmeterol (ADVAIR DISKUS) 250-50 MCG/DOSE AEPB Inhale 1 puff into the  lungs every 12 hours---   currently does only in am    . losartan (COZAAR) 50 MG tablet TAKE 1 TABLET BY MOUTH DAILY 30 tablet 6  . metFORMIN (GLUCOPHAGE) 500 MG tablet TAKE 1 TABLET (500 MG TOTAL) BY MOUTH 2 (TWO) TIMES DAILY WITH A MEAL. 60 tablet 4  . metoprolol succinate (TOPROL-XL) 25 MG 24 hr tablet TAKE 1 TABLET (25 MG TOTAL) BY MOUTH DAILY. 30 tablet 6  . Multiple Vitamins-Minerals (ZINC PO) Take 1 tablet by mouth daily.    Marland Kitchen omeprazole (PRILOSEC) 20 MG capsule Take 1 capsule by mouth two times  daily 180 capsule 1  . pravastatin (PRAVACHOL) 10 MG tablet Take 1 tablet by mouth  daily 90 tablet 1  . EPINEPHrine 0.3 mg/0.3 mL IJ SOAJ injection Inject 0.3 mLs (0.3 mg total) into the muscle once. (Patient not taking: Reported on 08/23/2014) 1 Device 1   Current Facility-Administered Medications on File Prior to Visit  Medication Dose Route Frequency Provider Last Rate Last Dose  . mitoMYcin (MUTAMYCIN) chemo injection 40 mg  40 mg Bladder Instillation Once Franchot Gallo, MD        Allergies  Allergen Reactions  . Bee Venom Shortness Of Breath  . Other Shortness Of Breath    MSG.  . Latex Itching    Review of Systems  Review of Systems  Constitutional: Negative for fever and malaise/fatigue.  HENT: Negative for congestion.   Eyes: Negative for discharge.  Respiratory: Negative for shortness of breath.   Cardiovascular: Negative for chest pain, palpitations and leg swelling.  Gastrointestinal: Negative for nausea, abdominal pain and diarrhea.  Genitourinary: Positive for dysuria and urgency. Negative for hematuria and flank pain.  Musculoskeletal: Negative for myalgias and falls.  Skin: Negative for rash.  Neurological: Negative for loss of consciousness and headaches.  Endo/Heme/Allergies: Negative for polydipsia.  Psychiatric/Behavioral: Negative for depression and suicidal ideas. The patient is not nervous/anxious and does not have insomnia.     Objective  BP 132/80 mmHg  Pulse 88  Temp(Src) 98.3 F (36.8 C) (Oral)  Ht 5\' 3"  (1.6 m)  Wt 201 lb 8 oz (91.4 kg)  BMI 35.70 kg/m2  SpO2 93%  Physical Exam  Physical Exam  Constitutional: She is oriented to person, place, and time and well-developed, well-nourished, and in no distress. No distress.  HENT:  Head: Normocephalic and atraumatic.  Eyes: Conjunctivae are normal.  Neck: Neck supple. No thyromegaly present.  Cardiovascular: Normal rate, regular rhythm and normal heart sounds.   No murmur  heard. Pulmonary/Chest: Effort normal and breath sounds normal. She has no wheezes.  Abdominal: Soft. Bowel sounds are normal. She exhibits no distension and no mass. There is no tenderness.  Musculoskeletal: She exhibits no edema.  Lymphadenopathy:    She has no cervical adenopathy.  Neurological: She is alert and oriented to person, place, and time.  Skin: Skin is warm and dry. No rash noted. She is not diaphoretic.  Psychiatric: Memory, affect and judgment normal.    Lab Results  Component Value Date   TSH 1.25 08/17/2014   Lab Results  Component Value Date   WBC 7.7 08/17/2014   HGB 13.3 08/17/2014   HCT 40.0 08/17/2014   MCV 90.6 08/17/2014   PLT 174.0 08/17/2014   Lab Results  Component Value Date   CREATININE 0.69 08/17/2014   BUN 14 08/17/2014   NA 138  08/17/2014   K 4.3 08/17/2014   CL 100 08/17/2014   CO2 30 08/17/2014   Lab Results  Component Value Date   ALT 9 08/17/2014   AST 12 08/17/2014   ALKPHOS 46 08/17/2014   BILITOT 0.4 08/17/2014   Lab Results  Component Value Date   CHOL 156 08/17/2014   Lab Results  Component Value Date   HDL 58.60 08/17/2014   Lab Results  Component Value Date   LDLCALC 73 08/17/2014   Lab Results  Component Value Date   TRIG 120.0 08/17/2014   Lab Results  Component Value Date   CHOLHDL 3 08/17/2014     Assessment & Plan  Hypertension Well controlled, no changes to meds. Encouraged heart healthy diet such as the DASH diet and exercise as tolerated.   Anemia Increase leafy greens, consider increased lean red meat and using cast iron cookware. Continue to monitor, report any concerns. Resolved  Hyperlipidemia Tolerating statin, encouraged heart healthy diet, avoid trans fats, minimize simple carbs and saturated fats. Increase exercise as tolerated  Esophageal reflux Avoid offending foods, start probiotics. Do not eat large meals in late evening and consider raising head of bed.   Malignant neoplasm of  bladder Has struggled with increased dysuria with her BCG treatments. Encouraged to try an IC diet to see if that helps  Diabetes mellitus type 2 in obese hgba1c acceptable, minimize simple carbs. Increase exercise as tolerated. Continue current meds  Urethral carcinoma Currently undergoing BCG treatments and struggling with some cystitis type symptoms. Encouraged to try following a diet to manage interstitial cystitis to se if that is helpful

## 2014-08-23 NOTE — Assessment & Plan Note (Signed)
Increase leafy greens, consider increased lean red meat and using cast iron cookware. Continue to monitor, report any concerns. Resolved

## 2014-08-23 NOTE — Assessment & Plan Note (Signed)
Has struggled with increased dysuria with her BCG treatments. Encouraged to try an IC diet to see if that helps

## 2014-09-02 NOTE — Assessment & Plan Note (Signed)
hgba1c acceptable, minimize simple carbs. Increase exercise as tolerated. Continue current meds 

## 2014-09-02 NOTE — Assessment & Plan Note (Signed)
Currently undergoing BCG treatments and struggling with some cystitis type symptoms. Encouraged to try following a diet to manage interstitial cystitis to se if that is helpful

## 2014-10-02 ENCOUNTER — Other Ambulatory Visit: Payer: Self-pay | Admitting: Family Medicine

## 2014-10-05 ENCOUNTER — Other Ambulatory Visit: Payer: Self-pay | Admitting: Family Medicine

## 2014-10-05 NOTE — Telephone Encounter (Signed)
LOV 08/23/14 Labs 08/17/14

## 2014-10-15 DIAGNOSIS — R300 Dysuria: Secondary | ICD-10-CM | POA: Diagnosis not present

## 2014-10-15 DIAGNOSIS — C672 Malignant neoplasm of lateral wall of bladder: Secondary | ICD-10-CM | POA: Diagnosis not present

## 2014-11-16 DIAGNOSIS — Z5111 Encounter for antineoplastic chemotherapy: Secondary | ICD-10-CM | POA: Diagnosis not present

## 2014-11-16 DIAGNOSIS — C672 Malignant neoplasm of lateral wall of bladder: Secondary | ICD-10-CM | POA: Diagnosis not present

## 2014-11-23 ENCOUNTER — Other Ambulatory Visit: Payer: Self-pay | Admitting: Family Medicine

## 2014-11-23 DIAGNOSIS — R300 Dysuria: Secondary | ICD-10-CM | POA: Diagnosis not present

## 2014-11-30 DIAGNOSIS — Z5111 Encounter for antineoplastic chemotherapy: Secondary | ICD-10-CM | POA: Diagnosis not present

## 2014-11-30 DIAGNOSIS — C672 Malignant neoplasm of lateral wall of bladder: Secondary | ICD-10-CM | POA: Diagnosis not present

## 2014-12-03 ENCOUNTER — Other Ambulatory Visit (INDEPENDENT_AMBULATORY_CARE_PROVIDER_SITE_OTHER): Payer: Medicare Other

## 2014-12-03 DIAGNOSIS — E785 Hyperlipidemia, unspecified: Secondary | ICD-10-CM | POA: Diagnosis not present

## 2014-12-03 DIAGNOSIS — I10 Essential (primary) hypertension: Secondary | ICD-10-CM | POA: Diagnosis not present

## 2014-12-03 DIAGNOSIS — D649 Anemia, unspecified: Secondary | ICD-10-CM | POA: Diagnosis not present

## 2014-12-03 DIAGNOSIS — E119 Type 2 diabetes mellitus without complications: Secondary | ICD-10-CM

## 2014-12-03 LAB — LIPID PANEL
Cholesterol: 154 mg/dL (ref 0–200)
HDL: 54.8 mg/dL (ref >39.00)
LDL CALC: 72 mg/dL (ref 0–99)
NonHDL: 99.34
TRIGLYCERIDES: 135 mg/dL (ref 0.0–149.0)
Total CHOL/HDL Ratio: 3
VLDL: 27 mg/dL (ref 0.0–40.0)

## 2014-12-03 LAB — COMPREHENSIVE METABOLIC PANEL
ALT: 9 U/L (ref 0–35)
AST: 12 U/L (ref 0–37)
Albumin: 4.1 g/dL (ref 3.5–5.2)
Alkaline Phosphatase: 46 U/L (ref 39–117)
BILIRUBIN TOTAL: 0.4 mg/dL (ref 0.2–1.2)
BUN: 16 mg/dL (ref 6–23)
CHLORIDE: 101 meq/L (ref 96–112)
CO2: 30 meq/L (ref 19–32)
Calcium: 9.2 mg/dL (ref 8.4–10.5)
Creatinine, Ser: 0.66 mg/dL (ref 0.40–1.20)
GFR: 95.16 mL/min (ref >60.00)
GLUCOSE: 113 mg/dL — AB (ref 70–99)
POTASSIUM: 4 meq/L (ref 3.5–5.1)
Sodium: 138 mEq/L (ref 135–145)
Total Protein: 6.6 g/dL (ref 6.0–8.3)

## 2014-12-03 LAB — TSH: TSH: 1.68 u[IU]/mL (ref 0.35–4.50)

## 2014-12-03 LAB — CBC
HEMATOCRIT: 41.2 % (ref 36.0–46.0)
Hemoglobin: 13.5 g/dL (ref 12.0–15.0)
MCHC: 32.9 g/dL (ref 30.0–36.0)
MCV: 93 fl (ref 78.0–100.0)
Platelets: 209 10*3/uL (ref 150.0–400.0)
RBC: 4.43 Mil/uL (ref 3.87–5.11)
RDW: 13 % (ref 11.5–15.5)
WBC: 7.7 10*3/uL (ref 4.0–10.5)

## 2014-12-03 LAB — HEMOGLOBIN A1C: HEMOGLOBIN A1C: 6.6 % — AB (ref 4.6–6.5)

## 2014-12-07 DIAGNOSIS — Z5111 Encounter for antineoplastic chemotherapy: Secondary | ICD-10-CM | POA: Diagnosis not present

## 2014-12-07 DIAGNOSIS — C672 Malignant neoplasm of lateral wall of bladder: Secondary | ICD-10-CM | POA: Diagnosis not present

## 2014-12-10 ENCOUNTER — Telehealth: Payer: Self-pay | Admitting: Family Medicine

## 2014-12-10 ENCOUNTER — Ambulatory Visit: Payer: Medicare Other | Admitting: Family Medicine

## 2014-12-13 NOTE — Telephone Encounter (Signed)
Pt was no show 12/10/14 7:45am for medicare wellness visit, pt has not rescheduled, charge for no show?

## 2014-12-13 NOTE — Telephone Encounter (Signed)
No she was just in

## 2014-12-14 ENCOUNTER — Other Ambulatory Visit: Payer: Self-pay | Admitting: Family Medicine

## 2014-12-28 DIAGNOSIS — Z23 Encounter for immunization: Secondary | ICD-10-CM | POA: Diagnosis not present

## 2014-12-31 ENCOUNTER — Other Ambulatory Visit: Payer: Self-pay | Admitting: Family Medicine

## 2014-12-31 MED ORDER — CITALOPRAM HYDROBROMIDE 40 MG PO TABS: 40.0000 mg | ORAL_TABLET | Freq: Every day | ORAL | Status: DC

## 2015-01-31 ENCOUNTER — Encounter: Payer: Self-pay | Admitting: Family Medicine

## 2015-01-31 ENCOUNTER — Ambulatory Visit (INDEPENDENT_AMBULATORY_CARE_PROVIDER_SITE_OTHER): Payer: Medicare Other | Admitting: Family Medicine

## 2015-01-31 VITALS — BP 138/69 | HR 82 | Temp 98.1°F | Ht 63.0 in | Wt 206.0 lb

## 2015-01-31 DIAGNOSIS — E785 Hyperlipidemia, unspecified: Secondary | ICD-10-CM | POA: Diagnosis not present

## 2015-01-31 DIAGNOSIS — E119 Type 2 diabetes mellitus without complications: Secondary | ICD-10-CM

## 2015-01-31 DIAGNOSIS — C680 Malignant neoplasm of urethra: Secondary | ICD-10-CM

## 2015-01-31 DIAGNOSIS — E669 Obesity, unspecified: Secondary | ICD-10-CM | POA: Insufficient documentation

## 2015-01-31 DIAGNOSIS — I10 Essential (primary) hypertension: Secondary | ICD-10-CM | POA: Diagnosis not present

## 2015-01-31 DIAGNOSIS — D649 Anemia, unspecified: Secondary | ICD-10-CM | POA: Diagnosis not present

## 2015-01-31 DIAGNOSIS — K219 Gastro-esophageal reflux disease without esophagitis: Secondary | ICD-10-CM

## 2015-01-31 DIAGNOSIS — E1169 Type 2 diabetes mellitus with other specified complication: Secondary | ICD-10-CM

## 2015-01-31 DIAGNOSIS — R011 Cardiac murmur, unspecified: Secondary | ICD-10-CM | POA: Diagnosis not present

## 2015-01-31 DIAGNOSIS — J449 Chronic obstructive pulmonary disease, unspecified: Secondary | ICD-10-CM

## 2015-01-31 HISTORY — DX: Type 2 diabetes mellitus with other specified complication: E66.9

## 2015-01-31 HISTORY — DX: Obesity, unspecified: E11.69

## 2015-01-31 MED ORDER — LOSARTAN POTASSIUM 50 MG PO TABS: ORAL_TABLET | ORAL | Status: DC

## 2015-01-31 MED ORDER — METOPROLOL SUCCINATE ER 25 MG PO TB24: ORAL_TABLET | ORAL | Status: DC

## 2015-01-31 MED ORDER — BUDESONIDE-FORMOTEROL FUMARATE 160-4.5 MCG/ACT IN AERO: 2.0000 | INHALATION_SPRAY | Freq: Two times a day (BID) | RESPIRATORY_TRACT | Status: DC

## 2015-01-31 MED ORDER — PRAVASTATIN SODIUM 10 MG PO TABS: ORAL_TABLET | ORAL | Status: DC

## 2015-01-31 MED ORDER — OMEPRAZOLE 20 MG PO CPDR: 20.0000 mg | DELAYED_RELEASE_CAPSULE | Freq: Two times a day (BID) | ORAL | Status: DC | PRN

## 2015-01-31 MED ORDER — CITALOPRAM HYDROBROMIDE 40 MG PO TABS: 40.0000 mg | ORAL_TABLET | Freq: Every day | ORAL | Status: DC

## 2015-01-31 NOTE — Progress Notes (Signed)
Pre visit review using our clinic review tool, if applicable. No additional management support is needed unless otherwise documented below in the visit note. 

## 2015-01-31 NOTE — Progress Notes (Signed)
Savannah Stanley [id number] October 16, 1950 01/31/2015      Patient Progress Note   Subjective  Chief Complaint  Chief Complaint  Patient presents with  . Follow-up    HPI  Patient is a 64 y.o. female in today for routine medical care and for follow-up on her labs. No major changes in lipid panel, CBC, A1c. Overall doing well but is struggling with stress and discomfort secondary to her urethral cancer treatments. She received a 1/3 dose in October but still experienced the same symptoms for 4 weeks. Since then she has had normal frequency and minor dysuria.She is returning to see Dr. Gabriela Eves in December for further treatment. She denies fevers, chills, flank pain or other concerning symptoms. Otherwise she reports her appetite is good and she is feeling well.  Taking meds as prescribed. She does need her advair to be switched to either Breo or Symbicort. Patient denies shortness of breath, chest pain,changes in urination, GI issues, recent fevers or illnesses. Patient denied DEXA scan, mammogram, and referral to ophthalmologist.    Past Medical History  Diagnosis Date  . Hypertension   . GERD (gastroesophageal reflux disease)   . H/O hiatal hernia   . Type 2 diabetes mellitus   . Iron deficiency anemia   . Bladder cancer UROLOGIST-- DR Diona Fanti    HIGH-GRADE UROTHELIAL CARCINOMA  . Depression   . COPD (chronic obstructive pulmonary disease)   . Murmur, heart 05/20/2014    Past Surgical History  Procedure Laterality Date  . Transurethral resection of bladder tumor with gyrus (turbt-gyrus) N/A 01/23/2013    Procedure: TRANSURETHRAL RESECTION OF BLADDER TUMOR WITH GYRUS ;  Surgeon: Franchot Gallo, MD;  Location: [facility];  Service: Urology;  Laterality: N/A;  . Appendectomy  1980'S  . Tubal ligation  1980'S  . Transurethral resection of bladder tumor N/A 02/16/2013    Procedure: TRANSURETHRAL RESECTION OF BLADDER TUMOR (TURBT);  Surgeon: Franchot Gallo, MD;  Location:  [facility];  Service: Urology;  Laterality: N/A;  . Transurethral resection of bladder tumor N/A 12/18/2013    Procedure: TRANSURETHRAL RESECTION OF BLADDER TUMOR (TURBT) ;  Surgeon: Jorja Loa, MD;  Location: [facility];  Service: Urology;  Laterality: N/A;  . Cystoscopy N/A 12/18/2013    Procedure: CYSTOSCOPY;  Surgeon: Jorja Loa, MD;  Location: [facility];  Service: Urology;  Laterality: N/A;    Family History  Problem Relation Age of Onset  . Cancer Father     prostate  . Arthritis Brother     Social History   Social History  . Marital Status: Widowed    Spouse Name: N/A  . Number of Children: N/A  . Years of Education: N/A   Occupational History  . Not on file.   Social History Main Topics  . Smoking status: Former Smoker -- 1.00 packs/day for 35 years    Types: Cigarettes    Quit date: 07/15/2010  . Smokeless tobacco: Never Used  . Alcohol Use: No  . Drug Use: No  . Sexual Activity: Not on file   Other Topics Concern  . Not on file   Social History Narrative    Current Outpatient Prescriptions on File Prior to Visit  Medication Sig Dispense Refill  . ADVAIR DISKUS 250-50 MCG/DOSE AEPB Use 1 inhalation into the  lungs every 12 hours 180 each 1  . albuterol (PROVENTIL HFA;VENTOLIN HFA) 108 (90 BASE) MCG/ACT inhaler Inhale 2 puffs into the lungs every 6 (six)  hours as needed for wheezing or shortness of breath. Please dispense ProAir 3 Inhaler 0  . Calcium Citrate-Vitamin D (CITRACAL + D PO) Take 2 tablets by mouth 2 (two) times daily.     . citalopram (CELEXA) 40 MG tablet Take 1 tablet (40 mg total) by mouth daily. 90 tablet 2  . Coenzyme Q10 (COQ10) 200 MG CAPS Take 1 capsule by mouth daily.    Marland Kitchen EPINEPHrine 0.3 mg/0.3 mL IJ SOAJ injection Inject 0.3 mLs (0.3 mg total) into the muscle once. (Patient not taking: Reported on 08/23/2014) 1 Device 1  . ferrous sulfate (FEOSOL) 325 (65 FE) MG tablet Take  325 mg by mouth daily with breakfast.     . losartan (COZAAR) 50 MG tablet Take 1 tablet by mouth  daily 90 tablet 1  . metFORMIN (GLUCOPHAGE) 500 MG tablet TAKE 1 TABLET (500 MG TOTAL) BY MOUTH 2 (TWO) TIMES DAILY WITH A MEAL. 60 tablet 3  . metoprolol succinate (TOPROL-XL) 25 MG 24 hr tablet TAKE 1 TABLET (25 MG TOTAL) BY MOUTH DAILY. 30 tablet 6  . Multiple Vitamins-Minerals (ZINC PO) Take 1 tablet by mouth daily.    Marland Kitchen omeprazole (PRILOSEC) 20 MG capsule Take 1 capsule (20 mg total) by mouth 2 (two) times daily as needed. 180 capsule 1  . pravastatin (PRAVACHOL) 10 MG tablet Take 1 tablet by mouth  daily 90 tablet 2   Current Facility-Administered Medications on File Prior to Visit  Medication Dose Route Frequency Provider Last Rate Last Dose  . mitoMYcin (MUTAMYCIN) chemo injection 40 mg  40 mg Bladder Instillation Once Franchot Gallo, MD        Allergies  Allergen Reactions  . Bee Venom Shortness Of Breath  . Other Shortness Of Breath    MSG.  . Latex Itching    Review of Systems  Constitutional: Negative for fever and malaise/fatigue.  HENT: Negative for congestion.  Eyes: Negative for discharge.  Respiratory: Negative for shortness of breath.  Cardiovascular: Negative for chest pain, palpitations and leg swelling.  Gastrointestinal: Negative for nausea, abdominal pain and diarrhea.  Genitourinary: Positive for dysuria and urgency, Negative hematuria and flank pain.  Musculoskeletal: Negative for myalgias and falls.  Skin: Negative for rash.  Neurological: Negative for loss of consciousness and headaches.  Endo/Heme/Allergies: Negative for polydipsia.  Psychiatric/Behavioral: Negative for depression and suicidal ideas. The patient is not nervous/anxious and does not have insomnia.   Objective  There were no vitals taken for this visit.  Physical Exam   Constitutional: She is oriented to person, place, and time. She appears well-nourished. No distress.  Eyes:  EOM are normal. Pupils are equal, round, and reactive to light.  Cardiovascular: Normal rate and regular rhythm.  Pulmonary/Chest: Breath sounds normal.  Abdominal: Soft. Bowel sounds are normal.  Lymphadenopathy:   She has no cervical adenopathy.  Neurological: She is alert and oriented to person, place, and time. She has normal reflexes. No cranial nerve deficit.    Assessment & Plan  Essential hypertension -Well controlled, no changes to meds. -Encouraged heart healthy diet and exercise as tolerated.   Diabetes Mellitus Type 2, controlled -HgA1c acceptable -Minimize simple carbs, increase exercise as tolerated -Continue current meds -Foot exam performed, within normal limits -Patient is on ARB, no urine micro needed  GERD -Avoid offending foods, start probiotics.  -Do not eat large meals in late evening and consider raising head of bed.  -continue current meds  Hyperlipidemia -Tolerating statin -Encouraged heart healthy diet, avoid trans fats,  minimize simple carbs and saturated fats.  -Increase exercise as tolerated  Anemia -Increase leafy greens, consider increased lean red meat and using cast iron cookware.  -Continue to monitor, report any concerns.   Urethral carcinoma -Currently undergoing BCG treatments and struggling with some cystitis type symptoms.  -Will follow up with specialist to manage dosing of BCG symptoms  COPD -Patient needs advair changed to either breo or symbicort -Will prescribe symbicort  Follow up in 6 months for routine care.

## 2015-01-31 NOTE — Patient Instructions (Signed)

## 2015-02-08 NOTE — Assessment & Plan Note (Signed)
Encouraged heart healthy diet, increase exercise, avoid trans fats, consider a krill oil cap daily 

## 2015-02-08 NOTE — Assessment & Plan Note (Signed)
She continues to follow with Dr Diona Fanti and she is doing very well

## 2015-02-08 NOTE — Progress Notes (Signed)
Subjective:    Patient ID: Savannah Stanley, female    DOB: 1950-05-18, 64 y.o.   MRN: [id number]  Chief Complaint  Patient presents with  . Follow-up    HPI Patient is in today for follow-up. Is generally doing well. No recent illness. Is struggling with her insurance and needs her inhaled steroid change due to formulary purposes. Continues to follow with urology for her urethral carcinoma is doing very well. Her dysuria has greatly improved. Denies CP/palp/SOB/HA/congestion/fevers/GI or GU c/o. Taking meds as prescribed  Past Medical History  Diagnosis Date  . Hypertension   . GERD (gastroesophageal reflux disease)   . H/O hiatal hernia   . Type 2 diabetes mellitus ([place])   . Iron deficiency anemia   . Bladder cancer (HCC) UROLOGIST-- DR Diona Fanti    HIGH-GRADE UROTHELIAL CARCINOMA  . Depression   . COPD (chronic obstructive pulmonary disease) ([place])   . Murmur, heart 05/20/2014  . Diabetes mellitus type 2 in obese ([place]) 01/31/2015    Past Surgical History  Procedure Laterality Date  . Transurethral resection of bladder tumor with gyrus (turbt-gyrus) N/A 01/23/2013    Procedure: TRANSURETHRAL RESECTION OF BLADDER TUMOR WITH GYRUS ;  Surgeon: Franchot Gallo, MD;  Location: [facility];  Service: Urology;  Laterality: N/A;  . Appendectomy  1980'S  . Tubal ligation  1980'S  . Transurethral resection of bladder tumor N/A 02/16/2013    Procedure: TRANSURETHRAL RESECTION OF BLADDER TUMOR (TURBT);  Surgeon: Franchot Gallo, MD;  Location: [facility];  Service: Urology;  Laterality: N/A;  . Transurethral resection of bladder tumor N/A 12/18/2013    Procedure: TRANSURETHRAL RESECTION OF BLADDER TUMOR (TURBT) ;  Surgeon: Jorja Loa, MD;  Location: [facility];  Service: Urology;  Laterality: N/A;  . Cystoscopy N/A 12/18/2013    Procedure: CYSTOSCOPY;  Surgeon: Jorja Loa, MD;  Location: [facility];  Service: Urology;  Laterality:  N/A;    Family History  Problem Relation Age of Onset  . Cancer Father     prostate  . Arthritis Brother     Social History   Social History  . Marital Status: Widowed    Spouse Name: N/A  . Number of Children: N/A  . Years of Education: N/A   Occupational History  . Not on file.   Social History Main Topics  . Smoking status: Former Smoker -- 1.00 packs/day for 35 years    Types: Cigarettes    Quit date: 07/15/2010  . Smokeless tobacco: Never Used  . Alcohol Use: No  . Drug Use: No  . Sexual Activity: Not on file   Other Topics Concern  . Not on file   Social History Narrative    Outpatient Prescriptions Prior to Visit  Medication Sig Dispense Refill  . albuterol (PROVENTIL HFA;VENTOLIN HFA) 108 (90 BASE) MCG/ACT inhaler Inhale 2 puffs into the lungs every 6 (six) hours as needed for wheezing or shortness of breath. Please dispense ProAir 3 Inhaler 0  . Calcium Citrate-Vitamin D (CITRACAL + D PO) Take 2 tablets by mouth 2 (two) times daily.     . Coenzyme Q10 (COQ10) 200 MG CAPS Take 1 capsule by mouth daily.    Marland Kitchen EPINEPHrine 0.3 mg/0.3 mL IJ SOAJ injection Inject 0.3 mLs (0.3 mg total) into the muscle once. 1 Device 1  . ferrous sulfate (FEOSOL) 325 (65 FE) MG tablet Take 325 mg by mouth daily with breakfast.     . metFORMIN (GLUCOPHAGE) 500  MG tablet TAKE 1 TABLET (500 MG TOTAL) BY MOUTH 2 (TWO) TIMES DAILY WITH A MEAL. 60 tablet 3  . Multiple Vitamins-Minerals (ZINC PO) Take 1 tablet by mouth daily.    Marland Kitchen ADVAIR DISKUS 250-50 MCG/DOSE AEPB Use 1 inhalation into the  lungs every 12 hours 180 each 1  . citalopram (CELEXA) 40 MG tablet Take 1 tablet (40 mg total) by mouth daily. 90 tablet 2  . losartan (COZAAR) 50 MG tablet Take 1 tablet by mouth  daily 90 tablet 1  . metoprolol succinate (TOPROL-XL) 25 MG 24 hr tablet TAKE 1 TABLET (25 MG TOTAL) BY MOUTH DAILY. 30 tablet 6  . omeprazole (PRILOSEC) 20 MG capsule Take 1 capsule (20 mg total) by mouth 2 (two) times  daily as needed. 180 capsule 1  . pravastatin (PRAVACHOL) 10 MG tablet Take 1 tablet by mouth  daily 90 tablet 2   Facility-Administered Medications Prior to Visit  Medication Dose Route Frequency Provider Last Rate Last Dose  . mitoMYcin (MUTAMYCIN) chemo injection 40 mg  40 mg Bladder Instillation Once Franchot Gallo, MD        Allergies  Allergen Reactions  . Bee Venom Shortness Of Breath  . Other Shortness Of Breath    MSG.  . Latex Itching    Review of Systems  Constitutional: Negative for fever and malaise/fatigue.  HENT: Negative for congestion.   Eyes: Negative for discharge.  Respiratory: Negative for shortness of breath.   Cardiovascular: Negative for chest pain, palpitations and leg swelling.  Gastrointestinal: Negative for nausea and abdominal pain.  Genitourinary: Negative for dysuria.  Musculoskeletal: Negative for falls.  Skin: Negative for rash.  Neurological: Negative for loss of consciousness and headaches.  Endo/Heme/Allergies: Negative for environmental allergies.  Psychiatric/Behavioral: Negative for depression. The patient is not nervous/anxious.        Objective:    Physical Exam  Constitutional: She is oriented to person, place, and time. She appears well-developed and well-nourished. No distress.  HENT:  Head: Normocephalic and atraumatic.  Eyes: Conjunctivae are normal.  Neck: Neck supple. No thyromegaly present.  Cardiovascular: Normal rate, regular rhythm and normal heart sounds.   No murmur heard. Pulmonary/Chest: Effort normal and breath sounds normal. No respiratory distress.  Abdominal: Soft. Bowel sounds are normal. She exhibits no distension and no mass. There is no tenderness.  Musculoskeletal: She exhibits no edema.  Lymphadenopathy:    She has no cervical adenopathy.  Neurological: She is alert and oriented to person, place, and time.  Skin: Skin is warm and dry.  Psychiatric: She has a normal mood and affect. Her behavior is  normal.    BP 138/69 mmHg  Pulse 82  Temp(Src) 98.1 F (36.7 C) (Oral)  Ht 5\' 3"  (1.6 m)  Wt 206 lb (93.441 kg)  BMI 36.50 kg/m2  SpO2 97% Wt Readings from Last 3 Encounters:  01/31/15 206 lb (93.441 kg)  08/23/14 201 lb 8 oz (91.4 kg)  05/18/14 201 lb 8 oz (91.4 kg)     Lab Results  Component Value Date   WBC 7.7 12/03/2014   HGB 13.5 12/03/2014   HCT 41.2 12/03/2014   PLT 209.0 12/03/2014   GLUCOSE 113* 12/03/2014   CHOL 154 12/03/2014   TRIG 135.0 12/03/2014   HDL 54.80 12/03/2014   LDLCALC 72 12/03/2014   ALT 9 12/03/2014   AST 12 12/03/2014   NA 138 12/03/2014   K 4.0 12/03/2014   CL 101 12/03/2014   CREATININE 0.66 12/03/2014  BUN 16 12/03/2014   CO2 30 12/03/2014   TSH 1.68 12/03/2014   INR 1.04 01/18/2013   HGBA1C 6.6* 12/03/2014    Lab Results  Component Value Date   TSH 1.68 12/03/2014   Lab Results  Component Value Date   WBC 7.7 12/03/2014   HGB 13.5 12/03/2014   HCT 41.2 12/03/2014   MCV 93.0 12/03/2014   PLT 209.0 12/03/2014   Lab Results  Component Value Date   NA 138 12/03/2014   K 4.0 12/03/2014   CO2 30 12/03/2014   GLUCOSE 113* 12/03/2014   BUN 16 12/03/2014   CREATININE 0.66 12/03/2014   BILITOT 0.4 12/03/2014   ALKPHOS 46 12/03/2014   AST 12 12/03/2014   ALT 9 12/03/2014   PROT 6.6 12/03/2014   ALBUMIN 4.1 12/03/2014   CALCIUM 9.2 12/03/2014   GFR 95.16 12/03/2014   Lab Results  Component Value Date   CHOL 154 12/03/2014   Lab Results  Component Value Date   HDL 54.80 12/03/2014   Lab Results  Component Value Date   LDLCALC 72 12/03/2014   Lab Results  Component Value Date   TRIG 135.0 12/03/2014   Lab Results  Component Value Date   CHOLHDL 3 12/03/2014   Lab Results  Component Value Date   HGBA1C 6.6* 12/03/2014       Assessment & Plan:   Problem List Items Addressed This Visit    Anemia - Primary   Relevant Orders   TSH   CBC   Comprehensive metabolic panel   Hemoglobin A1c   Lipid panel    Microalbumin / creatinine urine ratio   COPD (chronic obstructive pulmonary disease) ([place])    Has done well recently on Advair but her formulary will not accomodate this medicine so will switch to Symbicort      Relevant Medications   budesonide-formoterol (SYMBICORT) 160-4.5 MCG/ACT inhaler   Diabetes mellitus type 2 in obese (HCC)    hgba1c acceptable, minimize simple carbs. Increase exercise as tolerated. Continue current meds      Relevant Medications   losartan (COZAAR) 50 MG tablet   pravastatin (PRAVACHOL) 10 MG tablet   Other Relevant Orders   TSH   CBC   Comprehensive metabolic panel   Hemoglobin A1c   Lipid panel   Microalbumin / creatinine urine ratio   Esophageal reflux    Avoid offending foods, start probiotics. Do not eat large meals in late evening and consider raising head of bed.       Relevant Medications   omeprazole (PRILOSEC) 20 MG capsule   Hyperlipidemia    Encouraged heart healthy diet, increase exercise, avoid trans fats, consider a krill oil cap daily      Relevant Medications   metoprolol succinate (TOPROL-XL) 25 MG 24 hr tablet   losartan (COZAAR) 50 MG tablet   pravastatin (PRAVACHOL) 10 MG tablet   Other Relevant Orders   TSH   CBC   Comprehensive metabolic panel   Hemoglobin A1c   Lipid panel   Microalbumin / creatinine urine ratio   Hypertension    Well controlled, no changes to meds. Encouraged heart healthy diet such as the DASH diet and exercise as tolerated.       Relevant Medications   metoprolol succinate (TOPROL-XL) 25 MG 24 hr tablet   losartan (COZAAR) 50 MG tablet   pravastatin (PRAVACHOL) 10 MG tablet   Other Relevant Orders   TSH   CBC   Comprehensive metabolic panel   Hemoglobin  A1c   Lipid panel   Microalbumin / creatinine urine ratio   Murmur, heart   Relevant Orders   TSH   CBC   Comprehensive metabolic panel   Hemoglobin A1c   Lipid panel   Microalbumin / creatinine urine ratio   Urethral carcinoma  (HCC)    She continues to follow with Dr Diona Fanti and she is doing very well         I have discontinued Ms. Tormey's ADVAIR DISKUS. I am also having her maintain her ferrous sulfate, Calcium Citrate-Vitamin D (CITRACAL + D PO), albuterol, Multiple Vitamins-Minerals (ZINC PO), CoQ10, EPINEPHrine, metFORMIN, metoprolol succinate, citalopram, omeprazole, losartan, pravastatin, and budesonide-formoterol.  Meds ordered this encounter  Medications  . metoprolol succinate (TOPROL-XL) 25 MG 24 hr tablet    Sig: TAKE 1 TABLET (25 MG TOTAL) BY MOUTH DAILY.    Dispense:  90 tablet    Refill:  2    CYCLE FILL MEDICATION. Authorization is required for next refill.  . citalopram (CELEXA) 40 MG tablet    Sig: Take 1 tablet (40 mg total) by mouth daily.    Dispense:  90 tablet    Refill:  2    CYCLE FILL MEDICATION. Authorization is required for next refill.  Marland Kitchen omeprazole (PRILOSEC) 20 MG capsule    Sig: Take 1 capsule (20 mg total) by mouth 2 (two) times daily as needed.    Dispense:  180 capsule    Refill:  2    DISCONTINUE ALL PREVIOUS REFILLS FOR THIS MEDICATION  . losartan (COZAAR) 50 MG tablet    Sig: Take 1 tablet by mouth  daily    Dispense:  90 tablet    Refill:  2    DISCONTINUE ALL PREVIOUS REFILLS FOR THIS MEDICATION  . pravastatin (PRAVACHOL) 10 MG tablet    Sig: Take 1 tablet by mouth  daily    Dispense:  90 tablet    Refill:  2  . DISCONTD: budesonide-formoterol (SYMBICORT) 160-4.5 MCG/ACT inhaler    Sig: Inhale 2 puffs into the lungs 2 (two) times daily.  . budesonide-formoterol (SYMBICORT) 160-4.5 MCG/ACT inhaler    Sig: Inhale 2 puffs into the lungs 2 (two) times daily.    Dispense:  3 Inhaler    Refill:  1     Penni Homans, MD

## 2015-02-08 NOTE — Assessment & Plan Note (Signed)
Avoid offending foods, start probiotics. Do not eat large meals in late evening and consider raising head of bed.  

## 2015-02-08 NOTE — Assessment & Plan Note (Signed)
hgba1c acceptable, minimize simple carbs. Increase exercise as tolerated. Continue current meds 

## 2015-02-08 NOTE — Assessment & Plan Note (Addendum)
Has done well recently on Advair but her formulary will not accomodate this medicine so will switch to Symbicort

## 2015-02-08 NOTE — Assessment & Plan Note (Signed)
Well controlled, no changes to meds. Encouraged heart healthy diet such as the DASH diet and exercise as tolerated.  °

## 2015-02-27 DIAGNOSIS — C672 Malignant neoplasm of lateral wall of bladder: Secondary | ICD-10-CM | POA: Diagnosis not present

## 2015-03-19 ENCOUNTER — Encounter: Payer: Self-pay | Admitting: Physician Assistant

## 2015-03-19 ENCOUNTER — Ambulatory Visit (INDEPENDENT_AMBULATORY_CARE_PROVIDER_SITE_OTHER): Payer: Medicare Other | Admitting: Physician Assistant

## 2015-03-19 VITALS — BP 144/70 | HR 81 | Temp 98.6°F | Ht 63.0 in | Wt 204.4 lb

## 2015-03-19 DIAGNOSIS — J449 Chronic obstructive pulmonary disease, unspecified: Secondary | ICD-10-CM | POA: Diagnosis not present

## 2015-03-19 DIAGNOSIS — J019 Acute sinusitis, unspecified: Secondary | ICD-10-CM | POA: Diagnosis not present

## 2015-03-19 DIAGNOSIS — J440 Chronic obstructive pulmonary disease with acute lower respiratory infection: Secondary | ICD-10-CM

## 2015-03-19 DIAGNOSIS — J209 Acute bronchitis, unspecified: Secondary | ICD-10-CM

## 2015-03-19 DIAGNOSIS — B9689 Other specified bacterial agents as the cause of diseases classified elsewhere: Secondary | ICD-10-CM

## 2015-03-19 HISTORY — DX: Acute bronchitis, unspecified: J20.9

## 2015-03-19 MED ORDER — AMOXICILLIN-POT CLAVULANATE 875-125 MG PO TABS: 1.0000 | ORAL_TABLET | Freq: Two times a day (BID) | ORAL | Status: DC

## 2015-03-19 MED ORDER — ALBUTEROL SULFATE (2.5 MG/3ML) 0.083% IN NEBU: 2.5000 mg | INHALATION_SOLUTION | Freq: Once | RESPIRATORY_TRACT | Status: DC

## 2015-03-19 NOTE — Progress Notes (Signed)
Pre visit review using our clinic review tool, if applicable. No additional management support is needed unless otherwise documented below in the visit note. 

## 2015-03-19 NOTE — Assessment & Plan Note (Signed)
Albuterol neb given x 1 with improvement in wheezing. Rx Augmentin.  Increase fluids.  Rest.  Saline nasal spray.  Probiotic.  Mucinex as directed.  Humidifier in bedroom. Restart albuterol and Symbicort as directed.  Call or return to clinic if symptoms are not improving.

## 2015-03-19 NOTE — Patient Instructions (Signed)
Please take antibiotic as directed.  Increase fluid intake.  Use Saline nasal spray.  Take a daily multivitamin. Continue the Delsym and Albuterol as directed.  Resume your Symbicort. Place a humidifier in the bedroom.  Please call or return clinic if symptoms are not improving.  Sinusitis Sinusitis is redness, soreness, and swelling (inflammation) of the paranasal sinuses. Paranasal sinuses are air pockets within the bones of your face (beneath the eyes, the middle of the forehead, or above the eyes). In healthy paranasal sinuses, mucus is able to drain out, and air is able to circulate through them by way of your nose. However, when your paranasal sinuses are inflamed, mucus and air can become trapped. This can allow bacteria and other germs to grow and cause infection. Sinusitis can develop quickly and last only a short time (acute) or continue over a long period (chronic). Sinusitis that lasts for more than 12 weeks is considered chronic.  CAUSES  Causes of sinusitis include:  Allergies.  Structural abnormalities, such as displacement of the cartilage that separates your nostrils (deviated septum), which can decrease the air flow through your nose and sinuses and affect sinus drainage.  Functional abnormalities, such as when the small hairs (cilia) that line your sinuses and help remove mucus do not work properly or are not present. SYMPTOMS  Symptoms of acute and chronic sinusitis are the same. The primary symptoms are pain and pressure around the affected sinuses. Other symptoms include:  Upper toothache.  Earache.  Headache.  Bad breath.  Decreased sense of smell and taste.  A cough, which worsens when you are lying flat.  Fatigue.  Fever.  Thick drainage from your nose, which often is green and may contain pus (purulent).  Swelling and warmth over the affected sinuses. DIAGNOSIS  Your caregiver will perform a physical exam. During the exam, your caregiver may:  Look in  your nose for signs of abnormal growths in your nostrils (nasal polyps).  Tap over the affected sinus to check for signs of infection.  View the inside of your sinuses (endoscopy) with a special imaging device with a light attached (endoscope), which is inserted into your sinuses. If your caregiver suspects that you have chronic sinusitis, one or more of the following tests may be recommended:  Allergy tests.  Nasal culture A sample of mucus is taken from your nose and sent to a lab and screened for bacteria.  Nasal cytology A sample of mucus is taken from your nose and examined by your caregiver to determine if your sinusitis is related to an allergy. TREATMENT  Most cases of acute sinusitis are related to a viral infection and will resolve on their own within 10 days. Sometimes medicines are prescribed to help relieve symptoms (pain medicine, decongestants, nasal steroid sprays, or saline sprays).  However, for sinusitis related to a bacterial infection, your caregiver will prescribe antibiotic medicines. These are medicines that will help kill the bacteria causing the infection.  Rarely, sinusitis is caused by a fungal infection. In theses cases, your caregiver will prescribe antifungal medicine. For some cases of chronic sinusitis, surgery is needed. Generally, these are cases in which sinusitis recurs more than 3 times per year, despite other treatments. HOME CARE INSTRUCTIONS   Drink plenty of water. Water helps thin the mucus so your sinuses can drain more easily.  Use a humidifier.  Inhale steam 3 to 4 times a day (for example, sit in the bathroom with the shower running).  Apply a warm, moist  washcloth to your face 3 to 4 times a day, or as directed by your caregiver.  Use saline nasal sprays to help moisten and clean your sinuses.  Take over-the-counter or prescription medicines for pain, discomfort, or fever only as directed by your caregiver. SEEK IMMEDIATE MEDICAL CARE  IF:  You have increasing pain or severe headaches.  You have nausea, vomiting, or drowsiness.  You have swelling around your face.  You have vision problems.  You have a stiff neck.  You have difficulty breathing. MAKE SURE YOU:   Understand these instructions.  Will watch your condition.  Will get help right away if you are not doing well or get worse. Document Released: 03/02/2005 Document Revised: 05/25/2011 Document Reviewed: 03/17/2011 [facility] Patient Information 2014 [place], [place].

## 2015-03-19 NOTE — Progress Notes (Signed)
Patient presents to clinic today c/o 1.5 weeks of intermittent chills, chest congestion and productive pain. Also endorses nasal congestion and sinus pressure and facial pain. Denies ear pain. Denies fever. Denies recent travel or sick contact. Patient endorses taking Mucinex and Delsym for symptoms with some relief.  Past Medical History  Diagnosis Date  . Hypertension   . GERD (gastroesophageal reflux disease)   . H/O hiatal hernia   . Type 2 diabetes mellitus ([place])   . Iron deficiency anemia   . Bladder cancer (HCC) UROLOGIST-- DR Diona Fanti    HIGH-GRADE UROTHELIAL CARCINOMA  . Depression   . COPD (chronic obstructive pulmonary disease) ([place])   . Murmur, heart 05/20/2014  . Diabetes mellitus type 2 in obese ([place]) 01/31/2015    Current Outpatient Prescriptions on File Prior to Visit  Medication Sig Dispense Refill  . albuterol (PROVENTIL HFA;VENTOLIN HFA) 108 (90 BASE) MCG/ACT inhaler Inhale 2 puffs into the lungs every 6 (six) hours as needed for wheezing or shortness of breath. Please dispense ProAir 3 Inhaler 0  . budesonide-formoterol (SYMBICORT) 160-4.5 MCG/ACT inhaler Inhale 2 puffs into the lungs 2 (two) times daily. 3 Inhaler 1  . Calcium Citrate-Vitamin D (CITRACAL + D PO) Take 2 tablets by mouth 2 (two) times daily.     . citalopram (CELEXA) 40 MG tablet Take 1 tablet (40 mg total) by mouth daily. 90 tablet 2  . Coenzyme Q10 (COQ10) 200 MG CAPS Take 1 capsule by mouth daily.    . ferrous sulfate (FEOSOL) 325 (65 FE) MG tablet Take 325 mg by mouth daily with breakfast.     . losartan (COZAAR) 50 MG tablet Take 1 tablet by mouth  daily 90 tablet 2  . metFORMIN (GLUCOPHAGE) 500 MG tablet TAKE 1 TABLET (500 MG TOTAL) BY MOUTH 2 (TWO) TIMES DAILY WITH A MEAL. 60 tablet 3  . metoprolol succinate (TOPROL-XL) 25 MG 24 hr tablet TAKE 1 TABLET (25 MG TOTAL) BY MOUTH DAILY. 90 tablet 2  . Multiple Vitamins-Minerals (ZINC PO) Take 1 tablet by mouth daily.    Marland Kitchen omeprazole (PRILOSEC) 20  MG capsule Take 1 capsule (20 mg total) by mouth 2 (two) times daily as needed. 180 capsule 2  . pravastatin (PRAVACHOL) 10 MG tablet Take 1 tablet by mouth  daily 90 tablet 2  . EPINEPHrine 0.3 mg/0.3 mL IJ SOAJ injection Inject 0.3 mLs (0.3 mg total) into the muscle once. (Patient not taking: Reported on 03/19/2015) 1 Device 1   Current Facility-Administered Medications on File Prior to Visit  Medication Dose Route Frequency Provider Last Rate Last Dose  . mitoMYcin (MUTAMYCIN) chemo injection 40 mg  40 mg Bladder Instillation Once Franchot Gallo, MD        Allergies  Allergen Reactions  . Bee Venom Shortness Of Breath  . Other Shortness Of Breath    MSG.  . Latex Itching    Family History  Problem Relation Age of Onset  . Cancer Father     prostate  . Arthritis Brother     Social History   Social History  . Marital Status: Widowed    Spouse Name: N/A  . Number of Children: N/A  . Years of Education: N/A   Social History Main Topics  . Smoking status: Former Smoker -- 1.00 packs/day for 35 years    Types: Cigarettes    Quit date: 07/15/2010  . Smokeless tobacco: Never Used  . Alcohol Use: No  . Drug Use: No  . Sexual Activity:  Not Asked   Other Topics Concern  . None   Social History Narrative   Review of Systems - See HPI.  All other ROS are negative.  BP 144/70 mmHg  Pulse 81  Temp(Src) 98.6 F (37 C) (Oral)  Ht 5\' 3"  (1.6 m)  Wt 204 lb 6.4 oz (92.715 kg)  BMI 36.22 kg/m2  SpO2 98%  Physical Exam  Constitutional: She is oriented to person, place, and time and well-developed, well-nourished, and in no distress.  HENT:  Head: Normocephalic and atraumatic.  Right Ear: Tympanic membrane and external ear normal.  Left Ear: Tympanic membrane and external ear normal.  Nose: Right sinus exhibits maxillary sinus tenderness. Left sinus exhibits maxillary sinus tenderness.  Mouth/Throat: Uvula is midline, oropharynx is clear and moist and mucous membranes are  normal.  Eyes: Conjunctivae are normal.  Cardiovascular: Normal rate, regular rhythm, normal heart sounds and intact distal pulses.   Pulmonary/Chest: Effort normal. No respiratory distress. She has wheezes. She has no rales. She exhibits no tenderness.  Neurological: She is alert and oriented to person, place, and time.  Skin: Skin is warm and dry. No rash noted.  Psychiatric: Affect normal.   No results found for this or any previous visit (from the past 2160 hour(s)).  Assessment/Plan: Acute bacterial sinusitis Albuterol neb given x 1 with improvement in wheezing. Rx Augmentin.  Increase fluids.  Rest.  Saline nasal spray.  Probiotic.  Mucinex as directed.  Humidifier in bedroom. Restart albuterol and Symbicort as directed.  Call or return to clinic if symptoms are not improving.

## 2015-04-01 ENCOUNTER — Other Ambulatory Visit: Payer: Self-pay | Admitting: Family Medicine

## 2015-04-01 MED ORDER — CITALOPRAM HYDROBROMIDE 40 MG PO TABS: 40.0000 mg | ORAL_TABLET | Freq: Every day | ORAL | Status: DC

## 2015-05-16 ENCOUNTER — Other Ambulatory Visit: Payer: Self-pay | Admitting: Family Medicine

## 2015-05-31 DIAGNOSIS — C672 Malignant neoplasm of lateral wall of bladder: Secondary | ICD-10-CM | POA: Diagnosis not present

## 2015-05-31 DIAGNOSIS — R300 Dysuria: Secondary | ICD-10-CM | POA: Diagnosis not present

## 2015-05-31 DIAGNOSIS — Z0000 Encounter for general adult medical examination without abnormal findings: Secondary | ICD-10-CM | POA: Diagnosis not present

## 2015-07-04 ENCOUNTER — Other Ambulatory Visit (INDEPENDENT_AMBULATORY_CARE_PROVIDER_SITE_OTHER): Payer: Medicare Other

## 2015-07-04 DIAGNOSIS — E669 Obesity, unspecified: Principal | ICD-10-CM

## 2015-07-04 DIAGNOSIS — E785 Hyperlipidemia, unspecified: Secondary | ICD-10-CM | POA: Diagnosis not present

## 2015-07-04 DIAGNOSIS — E1169 Type 2 diabetes mellitus with other specified complication: Secondary | ICD-10-CM

## 2015-07-04 LAB — HEMOGLOBIN A1C: HEMOGLOBIN A1C: 6.5 % (ref 4.6–6.5)

## 2015-07-04 LAB — COMPREHENSIVE METABOLIC PANEL
ALBUMIN: 4.4 g/dL (ref 3.5–5.2)
ALK PHOS: 44 U/L (ref 39–117)
ALT: 11 U/L (ref 0–35)
AST: 16 U/L (ref 0–37)
BUN: 15 mg/dL (ref 6–23)
CALCIUM: 9.4 mg/dL (ref 8.4–10.5)
CHLORIDE: 100 meq/L (ref 96–112)
CO2: 30 mEq/L (ref 19–32)
CREATININE: 0.62 mg/dL (ref 0.40–1.20)
GFR: 102.1 mL/min (ref >60.00)
Glucose, Bld: 129 mg/dL — ABNORMAL HIGH (ref 70–99)
POTASSIUM: 3.8 meq/L (ref 3.5–5.1)
Sodium: 138 mEq/L (ref 135–145)
Total Bilirubin: 0.5 mg/dL (ref 0.2–1.2)
Total Protein: 6.9 g/dL (ref 6.0–8.3)

## 2015-07-04 LAB — LIPID PANEL
CHOLESTEROL: 170 mg/dL (ref 0–200)
HDL: 61.9 mg/dL (ref >39.00)
LDL Cholesterol: 85 mg/dL (ref 0–99)
NonHDL: 107.76
Total CHOL/HDL Ratio: 3
Triglycerides: 116 mg/dL (ref 0.0–149.0)
VLDL: 23.2 mg/dL (ref 0.0–40.0)

## 2015-07-04 LAB — CBC
HEMATOCRIT: 40.6 % (ref 36.0–46.0)
Hemoglobin: 13.5 g/dL (ref 12.0–15.0)
MCHC: 33.2 g/dL (ref 30.0–36.0)
MCV: 90.6 fl (ref 78.0–100.0)
PLATELETS: 193 10*3/uL (ref 150.0–400.0)
RBC: 4.48 Mil/uL (ref 3.87–5.11)
RDW: 13.1 % (ref 11.5–15.5)
WBC: 6 10*3/uL (ref 4.0–10.5)

## 2015-07-04 LAB — TSH: TSH: 0.92 u[IU]/mL (ref 0.35–4.50)

## 2015-07-08 ENCOUNTER — Other Ambulatory Visit: Payer: Medicare Other

## 2015-07-08 NOTE — Addendum Note (Signed)
Addended by: Caffie Pinto on: 07/08/2015 12:26 PM   Modules accepted: Orders

## 2015-07-15 ENCOUNTER — Other Ambulatory Visit (INDEPENDENT_AMBULATORY_CARE_PROVIDER_SITE_OTHER): Payer: Medicare Other

## 2015-07-15 ENCOUNTER — Encounter: Payer: Self-pay | Admitting: Family Medicine

## 2015-07-15 ENCOUNTER — Ambulatory Visit (INDEPENDENT_AMBULATORY_CARE_PROVIDER_SITE_OTHER): Payer: Medicare Other | Admitting: Family Medicine

## 2015-07-15 VITALS — BP 122/70 | HR 92 | Temp 98.8°F | Ht 63.0 in | Wt 211.1 lb

## 2015-07-15 DIAGNOSIS — J449 Chronic obstructive pulmonary disease, unspecified: Secondary | ICD-10-CM | POA: Diagnosis not present

## 2015-07-15 DIAGNOSIS — E669 Obesity, unspecified: Secondary | ICD-10-CM

## 2015-07-15 DIAGNOSIS — E785 Hyperlipidemia, unspecified: Secondary | ICD-10-CM

## 2015-07-15 DIAGNOSIS — E119 Type 2 diabetes mellitus without complications: Secondary | ICD-10-CM

## 2015-07-15 DIAGNOSIS — Z0000 Encounter for general adult medical examination without abnormal findings: Secondary | ICD-10-CM | POA: Diagnosis not present

## 2015-07-15 DIAGNOSIS — I10 Essential (primary) hypertension: Secondary | ICD-10-CM

## 2015-07-15 DIAGNOSIS — E1169 Type 2 diabetes mellitus with other specified complication: Secondary | ICD-10-CM

## 2015-07-15 NOTE — Progress Notes (Signed)
Subjective:    Patient ID: Savannah Stanley, female    DOB: 1950/07/30, 65 y.o.   MRN: [id number]  Chief Complaint  Patient presents with  . Medicare Wellness    HPI Patient is in today for Medicare Annual Wellness Visit.  She feels well today. She is frustrated with the price of her symbicort but after discussion of costs will stay wit the Symbicort because it does help her symptoms. No recent exacerbation. No recent hospitalization. Needs refill on Epipen due to expiration. Doing well with ADLs. Denies CP/palp/SOB/HA/congestion/fevers/GI or GU c/o. Taking meds as prescribed  Past Medical History  Diagnosis Date  . Hypertension   . GERD (gastroesophageal reflux disease)   . H/O hiatal hernia   . Type 2 diabetes mellitus ([place])   . Iron deficiency anemia   . Bladder cancer (HCC) UROLOGIST-- DR Diona Fanti    HIGH-GRADE UROTHELIAL CARCINOMA  . Depression   . COPD (chronic obstructive pulmonary disease) ([place])   . Murmur, heart 05/20/2014  . Diabetes mellitus type 2 in obese ([place]) 01/31/2015    Past Surgical History  Procedure Laterality Date  . Transurethral resection of bladder tumor with gyrus (turbt-gyrus) N/A 01/23/2013    Procedure: TRANSURETHRAL RESECTION OF BLADDER TUMOR WITH GYRUS ;  Surgeon: Franchot Gallo, MD;  Location: [facility];  Service: Urology;  Laterality: N/A;  . Appendectomy  1980'S  . Tubal ligation  1980'S  . Transurethral resection of bladder tumor N/A 02/16/2013    Procedure: TRANSURETHRAL RESECTION OF BLADDER TUMOR (TURBT);  Surgeon: Franchot Gallo, MD;  Location: [facility];  Service: Urology;  Laterality: N/A;  . Transurethral resection of bladder tumor N/A 12/18/2013    Procedure: TRANSURETHRAL RESECTION OF BLADDER TUMOR (TURBT) ;  Surgeon: Jorja Loa, MD;  Location: [facility];  Service: Urology;  Laterality: N/A;  . Cystoscopy N/A 12/18/2013    Procedure: CYSTOSCOPY;  Surgeon: Jorja Loa, MD;  Location:  [facility];  Service: Urology;  Laterality: N/A;    Family History  Problem Relation Age of Onset  . Cancer Father     prostate  . Arthritis Brother     Social History   Social History  . Marital Status: Widowed    Spouse Name: N/A  . Number of Children: N/A  . Years of Education: N/A   Occupational History  . Not on file.   Social History Main Topics  . Smoking status: Former Smoker -- 1.00 packs/day for 35 years    Types: Cigarettes    Quit date: 07/15/2010  . Smokeless tobacco: Never Used  . Alcohol Use: No  . Drug Use: No  . Sexual Activity: Not on file   Other Topics Concern  . Not on file   Social History Narrative    Outpatient Prescriptions Prior to Visit  Medication Sig Dispense Refill  . albuterol (PROVENTIL HFA;VENTOLIN HFA) 108 (90 BASE) MCG/ACT inhaler Inhale 2 puffs into the lungs every 6 (six) hours as needed for wheezing or shortness of breath. Please dispense ProAir 3 Inhaler 0  . budesonide-formoterol (SYMBICORT) 160-4.5 MCG/ACT inhaler Inhale 2 puffs into the lungs 2 (two) times daily. 3 Inhaler 1  . Calcium Citrate-Vitamin D (CITRACAL + D PO) Take 2 tablets by mouth 2 (two) times daily.     . citalopram (CELEXA) 40 MG tablet Take 1 tablet (40 mg total) by mouth daily. 90 tablet 2  . Coenzyme Q10 (COQ10) 200 MG CAPS Take 1 capsule by mouth daily.    Marland Kitchen  EPINEPHrine 0.3 mg/0.3 mL IJ SOAJ injection Inject 0.3 mLs (0.3 mg total) into the muscle once. (Patient not taking: Reported on 03/19/2015) 1 Device 1  . ferrous sulfate (FEOSOL) 325 (65 FE) MG tablet Take 325 mg by mouth daily with breakfast.     . losartan (COZAAR) 50 MG tablet Take 1 tablet by mouth  daily 90 tablet 2  . metFORMIN (GLUCOPHAGE) 500 MG tablet TAKE ONE TABLET BY MOUTH TWICE DAILY WITH MEALS 60 tablet 4  . metoprolol succinate (TOPROL-XL) 25 MG 24 hr tablet TAKE 1 TABLET (25 MG TOTAL) BY MOUTH DAILY. 90 tablet 2  . Multiple Vitamins-Minerals (ZINC PO) Take 1 tablet by  mouth daily.    Marland Kitchen omeprazole (PRILOSEC) 20 MG capsule Take 1 capsule (20 mg total) by mouth 2 (two) times daily as needed. 180 capsule 2  . pravastatin (PRAVACHOL) 10 MG tablet Take 1 tablet by mouth  daily 90 tablet 2  . amoxicillin-clavulanate (AUGMENTIN) 875-125 MG tablet Take 1 tablet by mouth 2 (two) times daily. 14 tablet 0   Facility-Administered Medications Prior to Visit  Medication Dose Route Frequency Provider Last Rate Last Dose  . albuterol (PROVENTIL) (2.5 MG/3ML) 0.083% nebulizer solution 2.5 mg  2.5 mg Nebulization Once Brunetta Jeans, PA-C      . mitoMYcin [facility]) chemo injection 40 mg  40 mg Bladder Instillation Once Franchot Gallo, MD        Allergies  Allergen Reactions  . Bee Venom Shortness Of Breath  . Other Shortness Of Breath    MSG.  . Latex Itching    Review of Systems  Constitutional: Negative for fever, chills and malaise/fatigue.  HENT: Negative for congestion and hearing loss.   Eyes: Negative for discharge.  Respiratory: Negative for cough, sputum production and shortness of breath.   Cardiovascular: Negative for chest pain, palpitations and leg swelling.  Gastrointestinal: Negative for heartburn, nausea, vomiting, abdominal pain, diarrhea, constipation and blood in stool.  Genitourinary: Negative for dysuria, urgency, frequency and hematuria.  Musculoskeletal: Negative for myalgias, back pain and falls.  Skin: Negative for rash.  Neurological: Negative for dizziness, sensory change, loss of consciousness, weakness and headaches.  Endo/Heme/Allergies: Negative for environmental allergies. Does not bruise/bleed easily.  Psychiatric/Behavioral: Negative for depression and suicidal ideas. The patient is not nervous/anxious and does not have insomnia.        Objective:    Physical Exam  Constitutional: She is oriented to person, place, and time. She appears well-developed and well-nourished. No distress.  HENT:  Head: Normocephalic and  atraumatic.  Right Ear: External ear normal.  Left Ear: External ear normal.  Nose: Nose normal.  Mouth/Throat: Oropharynx is clear and moist.  Eyes: Conjunctivae and EOM are normal. Pupils are equal, round, and reactive to light. Right eye exhibits no discharge. Left eye exhibits no discharge.  Neck: Normal range of motion. Neck supple. No JVD present. No thyromegaly present.  Cardiovascular: Normal rate, regular rhythm and intact distal pulses.   Murmur heard. Pulmonary/Chest: Effort normal and breath sounds normal. No respiratory distress. She has no wheezes. She has no rales. She exhibits no tenderness.  Abdominal: Soft. Bowel sounds are normal. She exhibits no distension and no mass. There is no tenderness. There is no rebound and no guarding.  Musculoskeletal: Normal range of motion. She exhibits no edema or tenderness.  Lymphadenopathy:    She has no cervical adenopathy.  Neurological: She is alert and oriented to person, place, and time. She has normal reflexes. No cranial  nerve deficit.  Skin: Skin is warm and dry. No rash noted. She is not diaphoretic. No erythema.  Psychiatric: She has a normal mood and affect. Her behavior is normal. Judgment and thought content normal.  Nursing note and vitals reviewed.   BP 122/70 mmHg  Pulse 92  Temp(Src) 98.8 F (37.1 C) (Oral)  Ht 5\' 3"  (1.6 m)  Wt 211 lb 2 oz (95.766 kg)  BMI 37.41 kg/m2  SpO2 94% Wt Readings from Last 3 Encounters:  07/15/15 211 lb 2 oz (95.766 kg)  03/19/15 204 lb 6.4 oz (92.715 kg)  01/31/15 206 lb (93.441 kg)     Lab Results  Component Value Date   WBC 6.0 07/04/2015   HGB 13.5 07/04/2015   HCT 40.6 07/04/2015   PLT 193.0 07/04/2015   GLUCOSE 129* 07/04/2015   CHOL 170 07/04/2015   TRIG 116.0 07/04/2015   HDL 61.90 07/04/2015   LDLCALC 85 07/04/2015   ALT 11 07/04/2015   AST 16 07/04/2015   NA 138 07/04/2015   K 3.8 07/04/2015   CL 100 07/04/2015   CREATININE 0.62 07/04/2015   BUN 15  07/04/2015   CO2 30 07/04/2015   TSH 0.92 07/04/2015   INR 1.04 01/18/2013   HGBA1C 6.5 07/04/2015   MICROALBUR <0.7 07/15/2015    Lab Results  Component Value Date   TSH 0.92 07/04/2015   Lab Results  Component Value Date   WBC 6.0 07/04/2015   HGB 13.5 07/04/2015   HCT 40.6 07/04/2015   MCV 90.6 07/04/2015   PLT 193.0 07/04/2015   Lab Results  Component Value Date   NA 138 07/04/2015   K 3.8 07/04/2015   CO2 30 07/04/2015   GLUCOSE 129* 07/04/2015   BUN 15 07/04/2015   CREATININE 0.62 07/04/2015   BILITOT 0.5 07/04/2015   ALKPHOS 44 07/04/2015   AST 16 07/04/2015   ALT 11 07/04/2015   PROT 6.9 07/04/2015   ALBUMIN 4.4 07/04/2015   CALCIUM 9.4 07/04/2015   GFR 102.10 07/04/2015   Lab Results  Component Value Date   CHOL 170 07/04/2015   Lab Results  Component Value Date   HDL 61.90 07/04/2015   Lab Results  Component Value Date   LDLCALC 85 07/04/2015   Lab Results  Component Value Date   TRIG 116.0 07/04/2015   Lab Results  Component Value Date   CHOLHDL 3 07/04/2015   Lab Results  Component Value Date   HGBA1C 6.5 07/04/2015       Assessment & Plan:   Problem List Items Addressed This Visit    Medicare annual wellness visit, initial - Primary    Patient denies any difficulties at home. No trouble with ADLs, depression or falls. See EMR for functional status screen and depression screen. No recent changes to vision or hearing. Is UTD with immunizations. Is UTD with screening. Discussed Advanced Directives. Encouraged heart healthy diet, exercise as tolerated and adequate sleep. See patient's problem list for health risk factors to monitor. See AVS for preventative healthcare recommendation schedule. Given and reviewed copy of ACP documents from [employer] and encouraged to complete and return. Labs reviewed. Given refill on Epipen      Hypertension    Well controlled, no changes to meds. Encouraged heart healthy diet such as the  DASH diet and exercise as tolerated.       Hyperlipidemia    Tolerating statin, encouraged heart healthy diet, avoid trans fats, minimize simple carbs and saturated fats.  Increase exercise as tolerated      Diabetes mellitus type 2 in obese (HCC)    hgba1c acceptable, minimize simple carbs. Increase exercise as tolerated. Continue current meds      COPD (chronic obstructive pulmonary disease) ([place])    Doing well on Symbicort, no changes to therapy.          I have discontinued Ms. Andrades's amoxicillin-clavulanate. I am also having her maintain her ferrous sulfate, Calcium Citrate-Vitamin D (CITRACAL + D PO), albuterol, Multiple Vitamins-Minerals (ZINC PO), CoQ10, EPINEPHrine, metoprolol succinate, omeprazole, losartan, pravastatin, budesonide-formoterol, citalopram, and metFORMIN. We will continue to administer albuterol.  No orders of the defined types were placed in this encounter.     Penni Homans, MD

## 2015-07-15 NOTE — Progress Notes (Signed)
Pre visit review using our clinic review tool, if applicable. No additional management support is needed unless otherwise documented below in the visit note. 

## 2015-07-15 NOTE — Patient Instructions (Addendum)
Schedule an eye exam.  Preventive Care for Adults, Female A healthy lifestyle and preventive care can promote health and wellness. Preventive health guidelines for women include the following key practices.  A routine yearly physical is a good way to check with your health care provider about your health and preventive screening. It is a chance to share any concerns and updates on your health and to receive a thorough exam.  Visit your dentist for a routine exam and preventive care every 6 months. Brush your teeth twice a day and floss once a day. Good oral hygiene prevents tooth decay and gum disease.  The frequency of eye exams is based on your age, health, family medical history, use of contact lenses, and other factors. Follow your health care provider's recommendations for frequency of eye exams.  Eat a healthy diet. Foods like vegetables, fruits, whole grains, low-fat dairy products, and lean protein foods contain the nutrients you need without too many calories. Decrease your intake of foods high in solid fats, added sugars, and salt. Eat the right amount of calories for you.Get information about a proper diet from your health care provider, if necessary.  Regular physical exercise is one of the most important things you can do for your health. Most adults should get at least 150 minutes of moderate-intensity exercise (any activity that increases your heart rate and causes you to sweat) each week. In addition, most adults need muscle-strengthening exercises on 2 or more days a week.  Maintain a healthy weight. The body mass index (BMI) is a screening tool to identify possible weight problems. It provides an estimate of body fat based on height and weight. Your health care provider can find your BMI and can help you achieve or maintain a healthy weight.For adults 20 years and older:  A BMI below 18.5 is considered underweight.  A BMI of 18.5 to 24.9 is normal.  A BMI of 25 to 29.9 is  considered overweight.  A BMI of 30 and above is considered obese.  Maintain normal blood lipids and cholesterol levels by exercising and minimizing your intake of saturated fat. Eat a balanced diet with plenty of fruit and vegetables. Blood tests for lipids and cholesterol should begin at age 55 and be repeated every 5 years. If your lipid or cholesterol levels are high, you are over 50, or you are at high risk for heart disease, you may need your cholesterol levels checked more frequently.Ongoing high lipid and cholesterol levels should be treated with medicines if diet and exercise are not working.  If you smoke, find out from your health care provider how to quit. If you do not use tobacco, do not start.  Lung cancer screening is recommended for adults aged 49-80 years who are at high risk for developing lung cancer because of a history of smoking. A yearly low-dose CT scan of the lungs is recommended for people who have at least a 30-pack-year history of smoking and are a current smoker or have quit within the past 15 years. A pack year of smoking is smoking an average of 1 pack of cigarettes a day for 1 year (for example: 1 pack a day for 30 years or 2 packs a day for 15 years). Yearly screening should continue until the smoker has stopped smoking for at least 15 years. Yearly screening should be stopped for people who develop a health problem that would prevent them from having lung cancer treatment.  If you are pregnant, do not  alcohol. If you are breastfeeding, be very cautious about drinking alcohol. If you are not pregnant and choose to drink alcohol, do not have more than 1 drink per day. One drink is considered to be 12 ounces (355 mL) of beer, 5 ounces (148 mL) of wine, or 1.5 ounces (44 mL) of liquor.  Avoid use of street drugs. Do not share needles with anyone. Ask for help if you need support or instructions about stopping the use of drugs.  High blood pressure causes heart disease and  increases the risk of stroke. Your blood pressure should be checked at least every 1 to 2 years. Ongoing high blood pressure should be treated with medicines if weight loss and exercise do not work.  If you are 55-79 years old, ask your health care provider if you should take aspirin to prevent strokes.  Diabetes screening is done by taking a blood sample to check your blood glucose level after you have not eaten for a certain period of time (fasting). If you are not overweight and you do not have risk factors for diabetes, you should be screened once every 3 years starting at age 45. If you are overweight or obese and you are 40-70 years of age, you should be screened for diabetes every year as part of your cardiovascular risk assessment.  Breast cancer screening is essential preventive care for women. You should practice "breast self-awareness." This means understanding the normal appearance and feel of your breasts and may include breast self-examination. Any changes detected, no matter how small, should be reported to a health care provider. Women in their 20s and 30s should have a clinical breast exam (CBE) by a health care provider as part of a regular health exam every 1 to 3 years. After age 40, women should have a CBE every year. Starting at age 40, women should consider having a mammogram (breast X-ray test) every year. Women who have a family history of breast cancer should talk to their health care provider about genetic screening. Women at a high risk of breast cancer should talk to their health care providers about having an MRI and a mammogram every year.  Breast cancer gene (BRCA)-related cancer risk assessment is recommended for women who have family members with BRCA-related cancers. BRCA-related cancers include breast, ovarian, tubal, and peritoneal cancers. Having family members with these cancers may be associated with an increased risk for harmful changes (mutations) in the breast  cancer genes BRCA1 and BRCA2. Results of the assessment will determine the need for genetic counseling and BRCA1 and BRCA2 testing.  Your health care provider may recommend that you be screened regularly for cancer of the pelvic organs (ovaries, uterus, and vagina). This screening involves a pelvic examination, including checking for microscopic changes to the surface of your cervix (Pap test). You may be encouraged to have this screening done every 3 years, beginning at age 21.  For women ages 30-65, health care providers may recommend pelvic exams and Pap testing every 3 years, or they may recommend the Pap and pelvic exam, combined with testing for human papilloma virus (HPV), every 5 years. Some types of HPV increase your risk of cervical cancer. Testing for HPV may also be done on women of any age with unclear Pap test results.  Other health care providers may not recommend any screening for nonpregnant women who are considered low risk for pelvic cancer and who do not have symptoms. Ask your health care provider if a screening pelvic   exam is right for you.  If you have had past treatment for cervical cancer or a condition that could lead to cancer, you need Pap tests and screening for cancer for at least 20 years after your treatment. If Pap tests have been discontinued, your risk factors (such as having a new sexual partner) need to be reassessed to determine if screening should resume. Some women have medical problems that increase the chance of getting cervical cancer. In these cases, your health care provider may recommend more frequent screening and Pap tests.  Colorectal cancer can be detected and often prevented. Most routine colorectal cancer screening begins at the age of 50 years and continues through age 75 years. However, your health care provider may recommend screening at an earlier age if you have risk factors for colon cancer. On a yearly basis, your health care provider may provide  home test kits to check for hidden blood in the stool. Use of a small camera at the end of a tube, to directly examine the colon (sigmoidoscopy or colonoscopy), can detect the earliest forms of colorectal cancer. Talk to your health care provider about this at age 50, when routine screening begins. Direct exam of the colon should be repeated every 5-10 years through age 75 years, unless early forms of precancerous polyps or small growths are found.  People who are at an increased risk for hepatitis B should be screened for this virus. You are considered at high risk for hepatitis B if:  You were born in a country where hepatitis B occurs often. Talk with your health care provider about which countries are considered high risk.  Your parents were born in a high-risk country and you have not received a shot to protect against hepatitis B (hepatitis B vaccine).  You have HIV or AIDS.  You use needles to inject street drugs.  You live with, or have sex with, someone who has hepatitis B.  You get hemodialysis treatment.  You take certain medicines for conditions like cancer, organ transplantation, and autoimmune conditions.  Hepatitis C blood testing is recommended for all people born from 1945 through 1965 and any individual with known risks for hepatitis C.  Practice safe sex. Use condoms and avoid high-risk sexual practices to reduce the spread of sexually transmitted infections (STIs). STIs include gonorrhea, chlamydia, syphilis, trichomonas, herpes, HPV, and human immunodeficiency virus (HIV). Herpes, HIV, and HPV are viral illnesses that have no cure. They can result in disability, cancer, and death.  You should be screened for sexually transmitted illnesses (STIs) including gonorrhea and chlamydia if:  You are sexually active and are younger than 24 years.  You are older than 24 years and your health care provider tells you that you are at risk for this type of infection.  Your sexual  activity has changed since you were last screened and you are at an increased risk for chlamydia or gonorrhea. Ask your health care provider if you are at risk.  If you are at risk of being infected with HIV, it is recommended that you take a prescription medicine daily to prevent HIV infection. This is called preexposure prophylaxis (PrEP). You are considered at risk if:  You are sexually active and do not regularly use condoms or know the HIV status of your partner(s).  You take drugs by injection.  You are sexually active with a partner who has HIV.  Talk with your health care provider about whether you are at high risk of being   infected with HIV. If you choose to begin PrEP, you should first be tested for HIV. You should then be tested every 3 months for as long as you are taking PrEP.  Osteoporosis is a disease in which the bones lose minerals and strength with aging. This can result in serious bone fractures or breaks. The risk of osteoporosis can be identified using a bone density scan. Women ages 65 years and over and women at risk for fractures or osteoporosis should discuss screening with their health care providers. Ask your health care provider whether you should take a calcium supplement or vitamin D to reduce the rate of osteoporosis.  Menopause can be associated with physical symptoms and risks. Hormone replacement therapy is available to decrease symptoms and risks. You should talk to your health care provider about whether hormone replacement therapy is right for you.  Use sunscreen. Apply sunscreen liberally and repeatedly throughout the day. You should seek shade when your shadow is shorter than you. Protect yourself by wearing long sleeves, pants, a wide-brimmed hat, and sunglasses year round, whenever you are outdoors.  Once a month, do a whole body skin exam, using a mirror to look at the skin on your back. Tell your health care provider of new moles, moles that have irregular  borders, moles that are larger than a pencil eraser, or moles that have changed in shape or color.  Stay current with required vaccines (immunizations).  Influenza vaccine. All adults should be immunized every year.  Tetanus, diphtheria, and acellular pertussis (Td, Tdap) vaccine. Pregnant women should receive 1 dose of Tdap vaccine during each pregnancy. The dose should be obtained regardless of the length of time since the last dose. Immunization is preferred during the 27th-36th week of gestation. An adult who has not previously received Tdap or who does not know her vaccine status should receive 1 dose of Tdap. This initial dose should be followed by tetanus and diphtheria toxoids (Td) booster doses every 10 years. Adults with an unknown or incomplete history of completing a 3-dose immunization series with Td-containing vaccines should begin or complete a primary immunization series including a Tdap dose. Adults should receive a Td booster every 10 years.  Varicella vaccine. An adult without evidence of immunity to varicella should receive 2 doses or a second dose if she has previously received 1 dose. Pregnant females who do not have evidence of immunity should receive the first dose after pregnancy. This first dose should be obtained before leaving the health care facility. The second dose should be obtained 4-8 weeks after the first dose.  Human papillomavirus (HPV) vaccine. Females aged 13-26 years who have not received the vaccine previously should obtain the 3-dose series. The vaccine is not recommended for use in pregnant females. However, pregnancy testing is not needed before receiving a dose. If a female is found to be pregnant after receiving a dose, no treatment is needed. In that case, the remaining doses should be delayed until after the pregnancy. Immunization is recommended for any person with an immunocompromised condition through the age of 26 years if she did not get any or all doses  earlier. During the 3-dose series, the second dose should be obtained 4-8 weeks after the first dose. The third dose should be obtained 24 weeks after the first dose and 16 weeks after the second dose.  Zoster vaccine. One dose is recommended for adults aged 60 years or older unless certain conditions are present.  Measles, mumps, and rubella (  MMR) vaccine. Adults born before 1957 generally are considered immune to measles and mumps. Adults born in 1957 or later should have 1 or more doses of MMR vaccine unless there is a contraindication to the vaccine or there is laboratory evidence of immunity to each of the three diseases. A routine second dose of MMR vaccine should be obtained at least 28 days after the first dose for students attending postsecondary schools, health care workers, or international travelers. People who received inactivated measles vaccine or an unknown type of measles vaccine during 1963-1967 should receive 2 doses of MMR vaccine. People who received inactivated mumps vaccine or an unknown type of mumps vaccine before 1979 and are at high risk for mumps infection should consider immunization with 2 doses of MMR vaccine. For females of childbearing age, rubella immunity should be determined. If there is no evidence of immunity, females who are not pregnant should be vaccinated. If there is no evidence of immunity, females who are pregnant should delay immunization until after pregnancy. Unvaccinated health care workers born before 1957 who lack laboratory evidence of measles, mumps, or rubella immunity or laboratory confirmation of disease should consider measles and mumps immunization with 2 doses of MMR vaccine or rubella immunization with 1 dose of MMR vaccine.  Pneumococcal 13-valent conjugate (PCV13) vaccine. When indicated, a person who is uncertain of his immunization history and has no record of immunization should receive the PCV13 vaccine. All adults 65 years of age and older  should receive this vaccine. An adult aged 19 years or older who has certain medical conditions and has not been previously immunized should receive 1 dose of PCV13 vaccine. This PCV13 should be followed with a dose of pneumococcal polysaccharide (PPSV23) vaccine. Adults who are at high risk for pneumococcal disease should obtain the PPSV23 vaccine at least 8 weeks after the dose of PCV13 vaccine. Adults older than 67 years of age who have normal immune system function should obtain the PPSV23 vaccine dose at least 1 year after the dose of PCV13 vaccine.  Pneumococcal polysaccharide (PPSV23) vaccine. When PCV13 is also indicated, PCV13 should be obtained first. All adults aged 65 years and older should be immunized. An adult younger than age 65 years who has certain medical conditions should be immunized. Any person who resides in a nursing home or long-term care facility should be immunized. An adult smoker should be immunized. People with an immunocompromised condition and certain other conditions should receive both PCV13 and PPSV23 vaccines. People with human immunodeficiency virus (HIV) infection should be immunized as soon as possible after diagnosis. Immunization during chemotherapy or radiation therapy should be avoided. Routine use of PPSV23 vaccine is not recommended for American Indians, [place] Natives, or people younger than 65 years unless there are medical conditions that require PPSV23 vaccine. When indicated, people who have unknown immunization and have no record of immunization should receive PPSV23 vaccine. One-time revaccination 5 years after the first dose of PPSV23 is recommended for people aged 19-64 years who have chronic kidney failure, nephrotic syndrome, asplenia, or immunocompromised conditions. People who received 1-2 doses of PPSV23 before age 65 years should receive another dose of PPSV23 vaccine at age 65 years or later if at least 5 years have passed since the previous dose. Doses  of PPSV23 are not needed for people immunized with PPSV23 at or after age 65 years.  Meningococcal vaccine. Adults with asplenia or persistent complement component deficiencies should receive 2 doses of quadrivalent meningococcal conjugate (MenACWY-D) vaccine. The   doses should be obtained at least 2 months apart. Microbiologists working with certain meningococcal bacteria, military recruits, people at risk during an outbreak, and people who travel to or live in countries with a high rate of meningitis should be immunized. A first-year college student up through age 21 years who is living in a residence hall should receive a dose if she did not receive a dose on or after her 16th birthday. Adults who have certain high-risk conditions should receive one or more doses of vaccine.  Hepatitis A vaccine. Adults who wish to be protected from this disease, have certain high-risk conditions, work with hepatitis A-infected animals, work in hepatitis A research labs, or travel to or work in countries with a high rate of hepatitis A should be immunized. Adults who were previously unvaccinated and who anticipate close contact with an international adoptee during the first 60 days after arrival in the [place] from a country with a high rate of hepatitis A should be immunized.  Hepatitis B vaccine. Adults who wish to be protected from this disease, have certain high-risk conditions, may be exposed to blood or other infectious body fluids, are household contacts or sex partners of hepatitis B positive people, are clients or workers in certain care facilities, or travel to or work in countries with a high rate of hepatitis B should be immunized.  Haemophilus influenzae type b (Hib) vaccine. A previously unvaccinated person with asplenia or sickle cell disease or having a scheduled splenectomy should receive 1 dose of Hib vaccine. Regardless of previous immunization, a recipient of a hematopoietic stem cell transplant  should receive a 3-dose series 6-12 months after her successful transplant. Hib vaccine is not recommended for adults with HIV infection. Preventive Services / Frequency Ages 19 to 39 years  Blood pressure check.** / Every 3-5 years.  Lipid and cholesterol check.** / Every 5 years beginning at age 20.  Clinical breast exam.** / Every 3 years for women in their 20s and 30s.  BRCA-related cancer risk assessment.** / For women who have family members with a BRCA-related cancer (breast, ovarian, tubal, or peritoneal cancers).  Pap test.** / Every 2 years from ages 21 through 29. Every 3 years starting at age 30 through age 65 or 70 with a history of 3 consecutive normal Pap tests.  HPV screening.** / Every 3 years from ages 30 through ages 65 to 70 with a history of 3 consecutive normal Pap tests.  Hepatitis C blood test.** / For any individual with known risks for hepatitis C.  Skin self-exam. / Monthly.  Influenza vaccine. / Every year.  Tetanus, diphtheria, and acellular pertussis (Tdap, Td) vaccine.** / Consult your health care provider. Pregnant women should receive 1 dose of Tdap vaccine during each pregnancy. 1 dose of Td every 10 years.  Varicella vaccine.** / Consult your health care provider. Pregnant females who do not have evidence of immunity should receive the first dose after pregnancy.  HPV vaccine. / 3 doses over 6 months, if 26 and younger. The vaccine is not recommended for use in pregnant females. However, pregnancy testing is not needed before receiving a dose.  Measles, mumps, rubella (MMR) vaccine.** / You need at least 1 dose of MMR if you were born in 1957 or later. You may also need a 2nd dose. For females of childbearing age, rubella immunity should be determined. If there is no evidence of immunity, females who are not pregnant should be vaccinated. If there is no evidence of   immunity, females who are pregnant should delay immunization until after  pregnancy.  Pneumococcal 13-valent conjugate (PCV13) vaccine.** / Consult your health care provider.  Pneumococcal polysaccharide (PPSV23) vaccine.** / 1 to 2 doses if you smoke cigarettes or if you have certain conditions.  Meningococcal vaccine.** / 1 dose if you are age 19 to 21 years and a first-year college student living in a residence hall, or have one of several medical conditions, you need to get vaccinated against meningococcal disease. You may also need additional booster doses.  Hepatitis A vaccine.** / Consult your health care provider.  Hepatitis B vaccine.** / Consult your health care provider.  Haemophilus influenzae type b (Hib) vaccine.** / Consult your health care provider. Ages 40 to 64 years  Blood pressure check.** / Every year.  Lipid and cholesterol check.** / Every 5 years beginning at age 20 years.  Lung cancer screening. / Every year if you are aged 55-80 years and have a 30-pack-year history of smoking and currently smoke or have quit within the past 15 years. Yearly screening is stopped once you have quit smoking for at least 15 years or develop a health problem that would prevent you from having lung cancer treatment.  Clinical breast exam.** / Every year after age 40 years.  BRCA-related cancer risk assessment.** / For women who have family members with a BRCA-related cancer (breast, ovarian, tubal, or peritoneal cancers).  Mammogram.** / Every year beginning at age 40 years and continuing for as long as you are in good health. Consult with your health care provider.  Pap test.** / Every 3 years starting at age 30 years through age 65 or 70 years with a history of 3 consecutive normal Pap tests.  HPV screening.** / Every 3 years from ages 30 years through ages 65 to 70 years with a history of 3 consecutive normal Pap tests.  Fecal occult blood test (FOBT) of stool. / Every year beginning at age 50 years and continuing until age 75 years. You may not need  to do this test if you get a colonoscopy every 10 years.  Flexible sigmoidoscopy or colonoscopy.** / Every 5 years for a flexible sigmoidoscopy or every 10 years for a colonoscopy beginning at age 50 years and continuing until age 75 years.  Hepatitis C blood test.** / For all people born from 1945 through 1965 and any individual with known risks for hepatitis C.  Skin self-exam. / Monthly.  Influenza vaccine. / Every year.  Tetanus, diphtheria, and acellular pertussis (Tdap/Td) vaccine.** / Consult your health care provider. Pregnant women should receive 1 dose of Tdap vaccine during each pregnancy. 1 dose of Td every 10 years.  Varicella vaccine.** / Consult your health care provider. Pregnant females who do not have evidence of immunity should receive the first dose after pregnancy.  Zoster vaccine.** / 1 dose for adults aged 60 years or older.  Measles, mumps, rubella (MMR) vaccine.** / You need at least 1 dose of MMR if you were born in 1957 or later. You may also need a second dose. For females of childbearing age, rubella immunity should be determined. If there is no evidence of immunity, females who are not pregnant should be vaccinated. If there is no evidence of immunity, females who are pregnant should delay immunization until after pregnancy.  Pneumococcal 13-valent conjugate (PCV13) vaccine.** / Consult your health care provider.  Pneumococcal polysaccharide (PPSV23) vaccine.** / 1 to 2 doses if you smoke cigarettes or if you have certain conditions.    Meningococcal vaccine.** / Consult your health care provider.  Hepatitis A vaccine.** / Consult your health care provider.  Hepatitis B vaccine.** / Consult your health care provider.  Haemophilus influenzae type b (Hib) vaccine.** / Consult your health care provider. Ages 65 years and over  Blood pressure check.** / Every year.  Lipid and cholesterol check.** / Every 5 years beginning at age 20 years.  Lung cancer  screening. / Every year if you are aged 55-80 years and have a 30-pack-year history of smoking and currently smoke or have quit within the past 15 years. Yearly screening is stopped once you have quit smoking for at least 15 years or develop a health problem that would prevent you from having lung cancer treatment.  Clinical breast exam.** / Every year after age 40 years.  BRCA-related cancer risk assessment.** / For women who have family members with a BRCA-related cancer (breast, ovarian, tubal, or peritoneal cancers).  Mammogram.** / Every year beginning at age 40 years and continuing for as long as you are in good health. Consult with your health care provider.  Pap test.** / Every 3 years starting at age 30 years through age 65 or 70 years with 3 consecutive normal Pap tests. Testing can be stopped between 65 and 70 years with 3 consecutive normal Pap tests and no abnormal Pap or HPV tests in the past 10 years.  HPV screening.** / Every 3 years from ages 30 years through ages 65 or 70 years with a history of 3 consecutive normal Pap tests. Testing can be stopped between 65 and 70 years with 3 consecutive normal Pap tests and no abnormal Pap or HPV tests in the past 10 years.  Fecal occult blood test (FOBT) of stool. / Every year beginning at age 50 years and continuing until age 75 years. You may not need to do this test if you get a colonoscopy every 10 years.  Flexible sigmoidoscopy or colonoscopy.** / Every 5 years for a flexible sigmoidoscopy or every 10 years for a colonoscopy beginning at age 50 years and continuing until age 75 years.  Hepatitis C blood test.** / For all people born from 1945 through 1965 and any individual with known risks for hepatitis C.  Osteoporosis screening.** / A one-time screening for women ages 65 years and over and women at risk for fractures or osteoporosis.  Skin self-exam. / Monthly.  Influenza vaccine. / Every year.  Tetanus, diphtheria, and  acellular pertussis (Tdap/Td) vaccine.** / 1 dose of Td every 10 years.  Varicella vaccine.** / Consult your health care provider.  Zoster vaccine.** / 1 dose for adults aged 60 years or older.  Pneumococcal 13-valent conjugate (PCV13) vaccine.** / Consult your health care provider.  Pneumococcal polysaccharide (PPSV23) vaccine.** / 1 dose for all adults aged 65 years and older.  Meningococcal vaccine.** / Consult your health care provider.  Hepatitis A vaccine.** / Consult your health care provider.  Hepatitis B vaccine.** / Consult your health care provider.  Haemophilus influenzae type b (Hib) vaccine.** / Consult your health care provider. ** Family history and personal history of risk and conditions may change your health care provider's recommendations.   This information is not intended to replace advice given to you by your health care provider. Make sure you discuss any questions you have with your health care provider.   Document Released: 04/28/2001 Document Revised: 03/23/2014 Document Reviewed: 07/28/2010 Elsevier Interactive Patient Education 2016 [employer]  

## 2015-07-16 LAB — MICROALBUMIN / CREATININE URINE RATIO
CREATININE, U: 22.5 mg/dL
Microalb Creat Ratio: 3.1 mg/g (ref 0.0–30.0)

## 2015-07-28 NOTE — Assessment & Plan Note (Signed)
Tolerating statin, encouraged heart healthy diet, avoid trans fats, minimize simple carbs and saturated fats. Increase exercise as tolerated 

## 2015-07-28 NOTE — Assessment & Plan Note (Signed)
hgba1c acceptable, minimize simple carbs. Increase exercise as tolerated. Continue current meds 

## 2015-07-28 NOTE — Assessment & Plan Note (Signed)
Well controlled, no changes to meds. Encouraged heart healthy diet such as the DASH diet and exercise as tolerated.  °

## 2015-07-28 NOTE — Assessment & Plan Note (Addendum)

## 2015-07-28 NOTE — Assessment & Plan Note (Signed)
Doing well on Symbicort, no changes to therapy.

## 2015-07-31 ENCOUNTER — Other Ambulatory Visit: Payer: Self-pay | Admitting: Family Medicine

## 2015-08-07 ENCOUNTER — Inpatient Hospital Stay (HOSPITAL_BASED_OUTPATIENT_CLINIC_OR_DEPARTMENT_OTHER)
Admission: EM | Admit: 2015-08-07 | Discharge: 2015-08-10 | DRG: 871 | Disposition: A | Discharge status: Home / Self Care | Payer: Medicare Other | Attending: Internal Medicine | Admitting: Internal Medicine

## 2015-08-07 ENCOUNTER — Emergency Department (HOSPITAL_BASED_OUTPATIENT_CLINIC_OR_DEPARTMENT_OTHER): Payer: Medicare Other

## 2015-08-07 ENCOUNTER — Ambulatory Visit (INDEPENDENT_AMBULATORY_CARE_PROVIDER_SITE_OTHER): Payer: Medicare Other | Admitting: Medical

## 2015-08-07 ENCOUNTER — Encounter (HOSPITAL_BASED_OUTPATIENT_CLINIC_OR_DEPARTMENT_OTHER): Payer: Self-pay | Admitting: Emergency Medicine

## 2015-08-07 VITALS — BP 95/56 | HR 104 | Wt 200.0 lb

## 2015-08-07 DIAGNOSIS — Z7984 Long term (current) use of oral hypoglycemic drugs: Secondary | ICD-10-CM

## 2015-08-07 DIAGNOSIS — J441 Chronic obstructive pulmonary disease with (acute) exacerbation: Secondary | ICD-10-CM | POA: Diagnosis present

## 2015-08-07 DIAGNOSIS — I10 Essential (primary) hypertension: Secondary | ICD-10-CM | POA: Diagnosis present

## 2015-08-07 DIAGNOSIS — Z79899 Other long term (current) drug therapy: Secondary | ICD-10-CM

## 2015-08-07 DIAGNOSIS — R7989 Other specified abnormal findings of blood chemistry: Secondary | ICD-10-CM

## 2015-08-07 DIAGNOSIS — K219 Gastro-esophageal reflux disease without esophagitis: Secondary | ICD-10-CM | POA: Diagnosis present

## 2015-08-07 DIAGNOSIS — N179 Acute kidney failure, unspecified: Secondary | ICD-10-CM | POA: Diagnosis present

## 2015-08-07 DIAGNOSIS — A419 Sepsis, unspecified organism: Secondary | ICD-10-CM | POA: Diagnosis not present

## 2015-08-07 DIAGNOSIS — G4700 Insomnia, unspecified: Secondary | ICD-10-CM

## 2015-08-07 DIAGNOSIS — T380X5A Adverse effect of glucocorticoids and synthetic analogues, initial encounter: Secondary | ICD-10-CM | POA: Diagnosis present

## 2015-08-07 DIAGNOSIS — R509 Fever, unspecified: Secondary | ICD-10-CM | POA: Diagnosis not present

## 2015-08-07 DIAGNOSIS — Z87891 Personal history of nicotine dependence: Secondary | ICD-10-CM

## 2015-08-07 DIAGNOSIS — R748 Abnormal levels of other serum enzymes: Secondary | ICD-10-CM | POA: Diagnosis present

## 2015-08-07 DIAGNOSIS — R778 Other specified abnormalities of plasma proteins: Secondary | ICD-10-CM | POA: Diagnosis present

## 2015-08-07 DIAGNOSIS — J440 Chronic obstructive pulmonary disease with acute lower respiratory infection: Secondary | ICD-10-CM | POA: Diagnosis present

## 2015-08-07 DIAGNOSIS — J189 Pneumonia, unspecified organism: Secondary | ICD-10-CM | POA: Diagnosis present

## 2015-08-07 DIAGNOSIS — R5383 Other fatigue: Secondary | ICD-10-CM | POA: Diagnosis not present

## 2015-08-07 DIAGNOSIS — E876 Hypokalemia: Secondary | ICD-10-CM | POA: Diagnosis present

## 2015-08-07 DIAGNOSIS — E871 Hypo-osmolality and hyponatremia: Secondary | ICD-10-CM | POA: Diagnosis not present

## 2015-08-07 DIAGNOSIS — E119 Type 2 diabetes mellitus without complications: Secondary | ICD-10-CM | POA: Diagnosis present

## 2015-08-07 DIAGNOSIS — R05 Cough: Secondary | ICD-10-CM

## 2015-08-07 DIAGNOSIS — R110 Nausea: Secondary | ICD-10-CM | POA: Diagnosis present

## 2015-08-07 DIAGNOSIS — J449 Chronic obstructive pulmonary disease, unspecified: Secondary | ICD-10-CM | POA: Diagnosis present

## 2015-08-07 DIAGNOSIS — R51 Headache: Secondary | ICD-10-CM | POA: Diagnosis present

## 2015-08-07 DIAGNOSIS — J209 Acute bronchitis, unspecified: Secondary | ICD-10-CM

## 2015-08-07 DIAGNOSIS — F32A Depression, unspecified: Secondary | ICD-10-CM | POA: Diagnosis present

## 2015-08-07 DIAGNOSIS — Z951 Presence of aortocoronary bypass graft: Secondary | ICD-10-CM

## 2015-08-07 DIAGNOSIS — E785 Hyperlipidemia, unspecified: Secondary | ICD-10-CM

## 2015-08-07 DIAGNOSIS — E7800 Pure hypercholesterolemia, unspecified: Secondary | ICD-10-CM | POA: Diagnosis present

## 2015-08-07 DIAGNOSIS — R9431 Abnormal electrocardiogram [ECG] [EKG]: Secondary | ICD-10-CM | POA: Diagnosis present

## 2015-08-07 DIAGNOSIS — F329 Major depressive disorder, single episode, unspecified: Secondary | ICD-10-CM

## 2015-08-07 DIAGNOSIS — R059 Cough, unspecified: Secondary | ICD-10-CM

## 2015-08-07 DIAGNOSIS — Z8551 Personal history of malignant neoplasm of bladder: Secondary | ICD-10-CM

## 2015-08-07 HISTORY — DX: Personal history of other medical treatment: Z92.89

## 2015-08-07 HISTORY — DX: Pure hypercholesterolemia, unspecified: E78.00

## 2015-08-07 HISTORY — DX: Pneumonia, unspecified organism: J18.9

## 2015-08-07 HISTORY — DX: Neoplasm of unspecified behavior of bladder: D49.4

## 2015-08-07 LAB — CBC WITH DIFFERENTIAL/PLATELET
BASOS ABS: 0 10*3/uL (ref 0.0–0.1)
Basophils Relative: 0 %
Eosinophils Absolute: 0 10*3/uL (ref 0.0–0.7)
Eosinophils Relative: 0 %
HEMATOCRIT: 40.8 % (ref 36.0–46.0)
HEMOGLOBIN: 13.7 g/dL (ref 12.0–15.0)
LYMPHS PCT: 4 %
Lymphs Abs: 0.9 10*3/uL (ref 0.7–4.0)
MCH: 30.9 pg (ref 26.0–34.0)
MCHC: 33.6 g/dL (ref 30.0–36.0)
MCV: 92.1 fL (ref 78.0–100.0)
MONO ABS: 1.2 10*3/uL — AB (ref 0.1–1.0)
MONOS PCT: 5 %
NEUTROS ABS: 19.9 10*3/uL — AB (ref 1.7–7.7)
Neutrophils Relative %: 91 %
Platelets: 179 10*3/uL (ref 150–400)
RBC: 4.43 MIL/uL (ref 3.87–5.11)
RDW: 12.7 % (ref 11.5–15.5)
WBC: 22 10*3/uL — ABNORMAL HIGH (ref 4.0–10.5)

## 2015-08-07 LAB — COMPREHENSIVE METABOLIC PANEL
ALK PHOS: 40 U/L (ref 38–126)
ALT: 13 U/L — ABNORMAL LOW (ref 14–54)
AST: 28 U/L (ref 15–41)
Albumin: 3.4 g/dL — ABNORMAL LOW (ref 3.5–5.0)
Anion gap: 13 (ref 5–15)
BILIRUBIN TOTAL: 1.2 mg/dL (ref 0.3–1.2)
BUN: 14 mg/dL (ref 6–20)
CO2: 23 mmol/L (ref 22–32)
Calcium: 8.2 mg/dL — ABNORMAL LOW (ref 8.9–10.3)
Chloride: 95 mmol/L — ABNORMAL LOW (ref 101–111)
Creatinine, Ser: 1.12 mg/dL — ABNORMAL HIGH (ref 0.44–1.00)
GFR calc Af Amer: 58 mL/min — ABNORMAL LOW (ref >60)
GFR, EST NON AFRICAN AMERICAN: 50 mL/min — AB (ref >60)
Glucose, Bld: 164 mg/dL — ABNORMAL HIGH (ref 65–99)
POTASSIUM: 2.9 mmol/L — AB (ref 3.5–5.1)
Sodium: 131 mmol/L — ABNORMAL LOW (ref 135–145)
Total Protein: 6.6 g/dL (ref 6.5–8.1)

## 2015-08-07 LAB — LACTIC ACID, PLASMA: Lactic Acid, Venous: 1.8 mmol/L (ref 0.5–2.0)

## 2015-08-07 LAB — GLUCOSE, CAPILLARY: Glucose-Capillary: 129 mg/dL — ABNORMAL HIGH (ref 65–99)

## 2015-08-07 LAB — TROPONIN I
TROPONIN I: 0.04 ng/mL — AB (ref <0.031)
Troponin I: 0.04 ng/mL — ABNORMAL HIGH (ref <0.031)

## 2015-08-07 MED ORDER — GUAIFENESIN ER 600 MG PO TB12
600.0000 mg | ORAL_TABLET | Freq: Two times a day (BID) | ORAL | Status: DC
  Administered 2015-08-07 – 2015-08-10 (×6): 600 mg via ORAL
  Filled 2015-08-07 (×6): qty 1

## 2015-08-07 MED ORDER — PANTOPRAZOLE SODIUM 40 MG PO TBEC
40.0000 mg | DELAYED_RELEASE_TABLET | Freq: Every day | ORAL | Status: DC
  Administered 2015-08-07 – 2015-08-10 (×4): 40 mg via ORAL
  Filled 2015-08-07 (×4): qty 1

## 2015-08-07 MED ORDER — DEXTROSE 5 % IV SOLN
1.0000 g | INTRAVENOUS | Status: DC
  Administered 2015-08-08 – 2015-08-10 (×3): 1 g via INTRAVENOUS
  Filled 2015-08-07 (×3): qty 10

## 2015-08-07 MED ORDER — ACETAMINOPHEN 325 MG PO TABS
650.0000 mg | ORAL_TABLET | Freq: Four times a day (QID) | ORAL | Status: DC | PRN
  Administered 2015-08-08: 650 mg via ORAL
  Filled 2015-08-07 (×2): qty 2

## 2015-08-07 MED ORDER — ARFORMOTEROL TARTRATE 15 MCG/2ML IN NEBU: 15.0000 ug | INHALATION_SOLUTION | Freq: Two times a day (BID) | RESPIRATORY_TRACT | Status: DC

## 2015-08-07 MED ORDER — ZOLPIDEM TARTRATE 5 MG PO TABS
5.0000 mg | ORAL_TABLET | ORAL | Status: AC
  Administered 2015-08-07: 5 mg via ORAL
  Filled 2015-08-07: qty 1

## 2015-08-07 MED ORDER — BENZONATATE 100 MG PO CAPS: 200.0000 mg | ORAL_CAPSULE | Freq: Two times a day (BID) | ORAL | Status: DC | PRN

## 2015-08-07 MED ORDER — ENOXAPARIN SODIUM 40 MG/0.4ML ~~LOC~~ SOLN
40.0000 mg | SUBCUTANEOUS | Status: DC
  Administered 2015-08-07 – 2015-08-09 (×3): 40 mg via SUBCUTANEOUS
  Filled 2015-08-07 (×3): qty 0.4

## 2015-08-07 MED ORDER — BUDESONIDE 0.5 MG/2ML IN SUSP: 0.5000 mg | Freq: Two times a day (BID) | RESPIRATORY_TRACT | Status: DC

## 2015-08-07 MED ORDER — ARFORMOTEROL TARTRATE 15 MCG/2ML IN NEBU
15.0000 ug | INHALATION_SOLUTION | Freq: Two times a day (BID) | RESPIRATORY_TRACT | Status: DC
  Administered 2015-08-08 – 2015-08-10 (×5): 15 ug via RESPIRATORY_TRACT
  Filled 2015-08-07 (×5): qty 2

## 2015-08-07 MED ORDER — IPRATROPIUM-ALBUTEROL 0.5-2.5 (3) MG/3ML IN SOLN
3.0000 mL | RESPIRATORY_TRACT | Status: DC | PRN
  Administered 2015-08-08 – 2015-08-10 (×4): 3 mL via RESPIRATORY_TRACT
  Filled 2015-08-07 (×4): qty 3

## 2015-08-07 MED ORDER — SODIUM CHLORIDE 0.9 % IV SOLN
INTRAVENOUS | Status: DC
  Administered 2015-08-07 – 2015-08-09 (×3): via INTRAVENOUS

## 2015-08-07 MED ORDER — ENSURE ENLIVE PO LIQD
237.0000 mL | Freq: Two times a day (BID) | ORAL | Status: DC
  Administered 2015-08-08: 237 mL via ORAL

## 2015-08-07 MED ORDER — AZITHROMYCIN 500 MG IV SOLR
500.0000 mg | INTRAVENOUS | Status: DC
  Administered 2015-08-08 – 2015-08-10 (×3): 500 mg via INTRAVENOUS
  Filled 2015-08-07 (×3): qty 500

## 2015-08-07 MED ORDER — SODIUM CHLORIDE 0.9 % IV BOLUS (SEPSIS)
1000.0000 mL | Freq: Once | INTRAVENOUS | Status: AC
  Administered 2015-08-07: 1000 mL via INTRAVENOUS

## 2015-08-07 MED ORDER — DIPHENHYDRAMINE HCL 25 MG PO CAPS: 25.0000 mg | ORAL_CAPSULE | Freq: Every evening | ORAL | Status: DC | PRN

## 2015-08-07 MED ORDER — METOPROLOL SUCCINATE ER 25 MG PO TB24: 25.0000 mg | ORAL_TABLET | Freq: Every day | ORAL | Status: DC

## 2015-08-07 MED ORDER — PROCHLORPERAZINE EDISYLATE 5 MG/ML IJ SOLN
10.0000 mg | Freq: Four times a day (QID) | INTRAMUSCULAR | Status: DC | PRN
  Administered 2015-08-08 – 2015-08-09 (×2): 10 mg via INTRAVENOUS
  Filled 2015-08-07 (×2): qty 2

## 2015-08-07 MED ORDER — LOSARTAN POTASSIUM 50 MG PO TABS
50.0000 mg | ORAL_TABLET | Freq: Every day | ORAL | Status: DC
  Administered 2015-08-08 – 2015-08-10 (×3): 50 mg via ORAL
  Filled 2015-08-07 (×3): qty 1

## 2015-08-07 MED ORDER — PREDNISONE 20 MG PO TABS
40.0000 mg | ORAL_TABLET | Freq: Every day | ORAL | Status: DC
  Administered 2015-08-07 – 2015-08-09 (×3): 40 mg via ORAL
  Filled 2015-08-07 (×3): qty 2

## 2015-08-07 MED ORDER — POTASSIUM CHLORIDE CRYS ER 20 MEQ PO TBCR
60.0000 meq | EXTENDED_RELEASE_TABLET | ORAL | Status: AC
  Administered 2015-08-07: 60 meq via ORAL
  Filled 2015-08-07: qty 3

## 2015-08-07 MED ORDER — DEXTROSE 5 % IV SOLN
500.0000 mg | Freq: Once | INTRAVENOUS | Status: AC
  Administered 2015-08-07: 500 mg via INTRAVENOUS

## 2015-08-07 MED ORDER — METOCLOPRAMIDE HCL 5 MG/ML IJ SOLN
10.0000 mg | Freq: Once | INTRAMUSCULAR | Status: AC
  Administered 2015-08-07: 10 mg via INTRAVENOUS
  Filled 2015-08-07: qty 2

## 2015-08-07 MED ORDER — INSULIN ASPART 100 UNIT/ML ~~LOC~~ SOLN
0.0000 [IU] | Freq: Three times a day (TID) | SUBCUTANEOUS | Status: DC
  Administered 2015-08-08: 2 [IU] via SUBCUTANEOUS
  Administered 2015-08-08: 3 [IU] via SUBCUTANEOUS
  Administered 2015-08-08: 2 [IU] via SUBCUTANEOUS
  Administered 2015-08-09: 7 [IU] via SUBCUTANEOUS
  Administered 2015-08-09: 2 [IU] via SUBCUTANEOUS
  Administered 2015-08-09: 1 [IU] via SUBCUTANEOUS
  Administered 2015-08-10: 3 [IU] via SUBCUTANEOUS
  Administered 2015-08-10: 1 [IU] via SUBCUTANEOUS

## 2015-08-07 MED ORDER — CITALOPRAM HYDROBROMIDE 40 MG PO TABS
40.0000 mg | ORAL_TABLET | Freq: Every day | ORAL | Status: DC
Start: 2015-08-07 — End: 2015-08-10
  Administered 2015-08-07 – 2015-08-10 (×4): 40 mg via ORAL
  Filled 2015-08-07 (×4): qty 1

## 2015-08-07 MED ORDER — BUDESONIDE 0.5 MG/2ML IN SUSP
0.5000 mg | Freq: Two times a day (BID) | RESPIRATORY_TRACT | Status: DC
  Administered 2015-08-08 – 2015-08-10 (×5): 0.5 mg via RESPIRATORY_TRACT
  Filled 2015-08-07 (×5): qty 2

## 2015-08-07 MED ORDER — PRAVASTATIN SODIUM 20 MG PO TABS
10.0000 mg | ORAL_TABLET | Freq: Every day | ORAL | Status: DC
  Administered 2015-08-07 – 2015-08-09 (×3): 10 mg via ORAL
  Filled 2015-08-07 (×3): qty 1

## 2015-08-07 MED ORDER — DEXTROSE 5 % IV SOLN
1.0000 g | Freq: Once | INTRAVENOUS | Status: AC
  Administered 2015-08-07: 1 g via INTRAVENOUS
  Filled 2015-08-07: qty 10

## 2015-08-07 MED ORDER — AZITHROMYCIN 500 MG IV SOLR
INTRAVENOUS | Status: AC
  Filled 2015-08-07: qty 500

## 2015-08-07 MED ORDER — COQ10 200 MG PO CAPS: 1.0000 | ORAL_CAPSULE | Freq: Every day | ORAL | Status: DC

## 2015-08-07 MED ORDER — ZOLPIDEM TARTRATE 5 MG PO TABS
5.0000 mg | ORAL_TABLET | Freq: Every evening | ORAL | Status: DC | PRN
  Administered 2015-08-08 – 2015-08-09 (×2): 5 mg via ORAL
  Filled 2015-08-07 (×2): qty 1

## 2015-08-07 NOTE — Progress Notes (Signed)
Patient evaluated at [facility] [place] and appropriate for transfer to [facility] for further treatment of severe CAP, HypoKalemia and AKI   Linna Darner, MD [facility] 08/07/2015, 4:09 PM

## 2015-08-07 NOTE — Patient Instructions (Addendum)
Your recent  fevers, chills, sweats and lung exam indicate,  may have bronchitis vs pneumonia. You need cxr stat.  Your level of fatigue requires stat labs.  Based on your poor intake recently and your weight loss you are likely dehydrated. So would benefit from IV hydration.  Your chest tighness ealier today was likey related to wheezing but ekg and troponin study may be beneficial.  Overall all work up needs to done quickly and done through ED. I called ED MD and notified them of your presentation. They know you are on your way down to ED now.

## 2015-08-07 NOTE — ED Notes (Signed)
MD at bedside. 

## 2015-08-07 NOTE — ED Notes (Signed)
Onset Sunday with weakness, productive cough, + chills and sweats.

## 2015-08-07 NOTE — Progress Notes (Signed)
Subjective:    Patient ID: Savannah Stanley, female    DOB: [birth year], 65 y.o.   MRN: [id number]  HPI  Pt in states at first nasal congested last week. Then got cough that was productive. Recent last day not productive. Pt feels chills with sweats. Some wheezing for 4 days. Pt has been fatigued. Some dry cough. Pt has been sweating last couple of days.  Pt has history of smoking. Pt stopped smoking May 2012. Hx of copd mild. Pt only took symbicort one time today. But other days has not.  Pt has not been sleeping well. Not eating well.   States no energy.  Pt mentioned some tightness of chest this am.    Review of Systems  Constitutional: Positive for fever, chills, diaphoresis and fatigue.  Respiratory: Positive for cough, shortness of breath and wheezing.   Gastrointestinal: Negative for abdominal pain, constipation and blood in stool.  Musculoskeletal: Negative for back pain.  Skin: Negative for pallor and rash.  Neurological: Negative for dizziness, syncope, weakness and headaches.  Hematological: Negative for adenopathy. Does not bruise/bleed easily.  Psychiatric/Behavioral: Negative for suicidal ideas, behavioral problems, confusion and sleep disturbance. The patient is not nervous/anxious.     Past Medical History  Diagnosis Date  . Hypertension   . GERD (gastroesophageal reflux disease)   . H/O hiatal hernia   . Type 2 diabetes mellitus ([place])   . Iron deficiency anemia   . Bladder cancer ([facility]) UROLOGIST-- DR Diona Fanti    HIGH-GRADE UROTHELIAL CARCINOMA  . Depression   . COPD (chronic obstructive pulmonary disease) ([place])   . Murmur, heart 05/20/2014  . Diabetes mellitus type 2 in obese ([place]) 01/31/2015     Social History   Social History  . Marital Status: Widowed    Spouse Name: N/A  . Number of Children: N/A  . Years of Education: N/A   Occupational History  . Not on file.   Social History Main Topics  . Smoking status: Former Smoker -- 1.00 packs/day for  35 years    Types: Cigarettes    Quit date: 07/15/2010  . Smokeless tobacco: Never Used  . Alcohol Use: No  . Drug Use: No  . Sexual Activity: Not on file   Other Topics Concern  . Not on file   Social History Narrative    Past Surgical History  Procedure Laterality Date  . Transurethral resection of bladder tumor with gyrus (turbt-gyrus) N/A 01/23/2013    Procedure: TRANSURETHRAL RESECTION OF BLADDER TUMOR WITH GYRUS ;  Surgeon: Franchot Gallo, MD;  Location: [facility];  Service: Urology;  Laterality: N/A;  . Appendectomy  1980'S  . Tubal ligation  1980'S  . Transurethral resection of bladder tumor N/A 02/16/2013    Procedure: TRANSURETHRAL RESECTION OF BLADDER TUMOR (TURBT);  Surgeon: Franchot Gallo, MD;  [facility];  Service: Urology;  Laterality: N/A;  . Transurethral resection of bladder tumor N/A 12/18/2013    Procedure: TRANSURETHRAL RESECTION OF BLADDER TUMOR (TURBT) ;  Surgeon: Jorja Loa, MD;  Location: [facility];  Service: Urology;  Laterality: N/A;  . Cystoscopy N/A 12/18/2013    Procedure: CYSTOSCOPY;  Surgeon: Jorja Loa, MD;  Location: [facility];  Service: Urology;  Laterality: N/A;    Family History  Problem Relation Age of Onset  . Cancer Father     prostate  . Arthritis Brother     Allergies  Allergen Reactions  . Bee Venom Shortness Of Breath  .  Other Shortness Of Breath    MSG.  . Latex Itching    Current Outpatient Prescriptions on File Prior to Visit  Medication Sig Dispense Refill  . albuterol (PROVENTIL HFA;VENTOLIN HFA) 108 (90 BASE) MCG/ACT inhaler Inhale 2 puffs into the lungs every 6 (six) hours as needed for wheezing or shortness of breath. Please dispense ProAir 3 Inhaler 0  . Calcium Citrate-Vitamin D (CITRACAL + D PO) Take 2 tablets by mouth 2 (two) times daily.     . citalopram (CELEXA) 40 MG tablet Take 1 tablet (40 mg total) by mouth daily. 90 tablet 2  .  Coenzyme Q10 (COQ10) 200 MG CAPS Take 1 capsule by mouth daily.    Marland Kitchen EPINEPHrine 0.3 mg/0.3 mL IJ SOAJ injection Inject 0.3 mLs (0.3 mg total) into the muscle once. (Patient not taking: Reported on 03/19/2015) 1 Device 1  . ferrous sulfate (FEOSOL) 325 (65 FE) MG tablet Take 325 mg by mouth daily with breakfast.     . losartan (COZAAR) 50 MG tablet Take 1 tablet by mouth  daily 90 tablet 2  . metFORMIN (GLUCOPHAGE) 500 MG tablet TAKE ONE TABLET BY MOUTH TWICE DAILY WITH MEALS 60 tablet 4  . metoprolol succinate (TOPROL-XL) 25 MG 24 hr tablet TAKE 1 TABLET (25 MG TOTAL) BY MOUTH DAILY. 90 tablet 2  . Multiple Vitamins-Minerals (ZINC PO) Take 1 tablet by mouth daily.    Marland Kitchen omeprazole (PRILOSEC) 20 MG capsule Take 1 capsule (20 mg total) by mouth 2 (two) times daily as needed. 180 capsule 2  . pravastatin (PRAVACHOL) 10 MG tablet Take 1 tablet by mouth  daily 90 tablet 2  . SYMBICORT 160-4.5 MCG/ACT inhaler Use 2 puffs two times daily 30.6 g 1   Current Facility-Administered Medications on File Prior to Visit  Medication Dose Route Frequency Provider Last Rate Last Dose  . albuterol (PROVENTIL) (2.5 MG/3ML) 0.083% nebulizer solution 2.5 mg  2.5 mg Nebulization Once Brunetta Jeans, PA-C      . mitoMYcin [facility]) chemo injection 40 mg  40 mg Bladder Instillation Once Franchot Gallo, MD        There were no vitals taken for this visit.       Objective:   Physical Exam  General  Mental Status - Alert. General Appearance - Well groomed. But appears very fatigued. Hard time standing and ambulating due to fatigue.  Skin Rashes- No Rashes. Feels dry.  HEENT Head- Normal. Ear Auditory Canal - Left- Normal. Right - Normal.Tympanic Membrane- Left- Normal. Right- Normal. Eye Sclera/Conjunctiva- Left- Normal. Right- Normal. Nose & Sinuses Nasal Mucosa- Left-  Boggy and Congested. Right-  Boggy and  Congested.Bilateral maxillary and frontal sinus pressure. Mouth & Throat Lips: Upper Lip-  Normal: no dryness, cracking, pallor, cyanosis, or vesicular eruption. Lower Lip-Normal: no dryness, cracking, pallor, cyanosis or vesicular eruption. Buccal Mucosa- Bilateral- No Aphthous ulcers. Oropharynx- No Discharge or Erythema. Tonsils: Characteristics- Bilateral- No Erythema or Congestion. Size/Enlargement- Bilateral- No enlargement. Discharge- bilateral-None.  Neck Neck- Supple. No Masses.   Chest and Lung Exam Auscultation: Breath Sounds:-even, rt side of lung field clear. Left side shows diffuse rough breath sounds.  Cardiovascular Auscultation:Rythm- Regular, rate and rhythm. Murmurs & Other Heart Sounds:Ausculatation of the heart reveal- No Murmurs.  Lymphatic Head & Neck General Head & Neck Lymphatics: Bilateral: Description- No Localized lymphadenopathy.       Assessment & Plan:  Your recent  fever, chills, sweats and lung exam, may  indicate  you  have bronchitis vs pneumonia.  You need cxr stat.  Your level of fatigue requires stat labs.  Based on your poor intake recently and your weight loss you are likely dehydrated. So would benefit from IV hydration.  Your chest tighness ealier today was likey related to wheezing but ekg and troponin study may be beneficial.  Overall all work up needs to done quickly and done through ED. I called them and notified them of your presentation. They know you are on your way down to ED now.  Sharnay Cashion, Percell Miller, PA-C

## 2015-08-07 NOTE — H&P (Addendum)
History and Physical    Savannah Stanley [id number] DOB: 06-Oct-1950 DOA: 08/07/2015  Referring MD/NP/PA: Dr. Stark Jock PCP: Penni Homans, MD  Patient coming from: [facility] transfer  Chief Complaint: Weakness with cough and congestion   HPI: Savannah Stanley is a 65 y.o. female with medical history significant of HTN,  DM type II, COPD, HLD, gerd, and bladder cancer s/p resection; who presents with complaints of generalized weakness with cough and congestion. Symptoms started approximately 1 week ago has just a "cold". She reports having loose's sputum production with the cough that she usually just swallowed. She reports utilizing Mucinex and Delsym with some improvement of symptoms first. However, symptoms persisted and the cough changed about 2 days ago(5/22) and became nonproductive. She's had some left lower rib pain associated with coughing. Associated symptoms at this time included chills, intermittent wheezing, shortness of breath with exertion, fatigue, decreased energy, nausea, dry heaves, no appetite, weight loss of 10 pounds over the last week, and headache. She denies having any fever, vomiting, rash, changes in vision, focal weakness, syncope, diarrhea, dysuria, or urinary frequency symptoms. She is up-to-date on her pneumonia and flu vaccines. She reports being around 3 grandchildren that are constantly a sick with something.    ED Course: Upon admission to the emergency department patient was evaluated and seen to have a temperature of 29F, pulse rate of up to 104, respirations up to 22, blood pressure as low as 91/52. Initial lab work revealed WBC 22, hemoglobin 13.7, platelets 179, sodium 131, potassium 2.9, chloride 95, BUN 14, creatinine 1.12 and initial troponin 0.04.  Chest x-ray revealed left lower lobe infiltrate. Patient was given 1 L of IV fluids and started on azithromycin and ceftriaxone. Patient was transferred from [facility] to a telemetry bed with TRH to admit.  Review  of Systems: As per HPI otherwise 10 point review of systems negative.   Past Medical History  Diagnosis Date  . Hypertension   . GERD (gastroesophageal reflux disease)   . H/O hiatal hernia   . Type 2 diabetes mellitus ([place])   . Iron deficiency anemia   . Bladder cancer (HCC) UROLOGIST-- DR Diona Fanti    HIGH-GRADE UROTHELIAL CARCINOMA  . Depression   . COPD (chronic obstructive pulmonary disease) ([place])   . Murmur, heart 05/20/2014  . Diabetes mellitus type 2 in obese ([place]) 01/31/2015    Past Surgical History  Procedure Laterality Date  . Transurethral resection of bladder tumor with gyrus (turbt-gyrus) N/A 01/23/2013    Procedure: TRANSURETHRAL RESECTION OF BLADDER TUMOR WITH GYRUS ;  Surgeon: Franchot Gallo, MD;  Location: [facility];  Service: Urology;  Laterality: N/A;  . Appendectomy  1980'S  . Tubal ligation  1980'S  . Transurethral resection of bladder tumor N/A 02/16/2013    Procedure: TRANSURETHRAL RESECTION OF BLADDER TUMOR (TURBT);  Surgeon: Franchot Gallo, MD;  Location: [facility];  Service: Urology;  Laterality: N/A;  . Transurethral resection of bladder tumor N/A 12/18/2013    Procedure: TRANSURETHRAL RESECTION OF BLADDER TUMOR (TURBT) ;  Surgeon: Jorja Loa, MD;  Location: [facility];  Service: Urology;  Laterality: N/A;  . Cystoscopy N/A 12/18/2013    Procedure: CYSTOSCOPY;  Surgeon: Jorja Loa, MD;  Location: [facility];  Service: Urology;  Laterality: N/A;     reports that she quit smoking about 5 years ago. Her smoking use included Cigarettes. She has a 35 pack-year smoking history. She has never used smokeless tobacco.  She reports that she does not drink alcohol or use illicit drugs.  Allergies  Allergen Reactions  . Bee Venom Shortness Of Breath  . Other Shortness Of Breath    MSG.  . Latex Itching    Family History  Problem Relation Age of Onset  . Cancer Father     prostate  . Arthritis  Brother     Prior to Admission medications   Medication Sig Start Date End Date Taking? Authorizing Provider  albuterol (PROVENTIL HFA;VENTOLIN HFA) 108 (90 BASE) MCG/ACT inhaler Inhale 2 puffs into the lungs every 6 (six) hours as needed for wheezing or shortness of breath. Please dispense ProAir 11/24/13  Yes [name], MD  Calcium Citrate-Vitamin D (CITRACAL + D PO) Take 2 tablets by mouth 2 (two) times daily.    Yes Historical Provider, MD  citalopram (CELEXA) 40 MG tablet Take 1 tablet (40 mg total) by mouth daily. 04/01/15  Yes [name], MD  Coenzyme Q10 (COQ10) 200 MG CAPS Take 1 capsule by mouth daily.   Yes Historical Provider, MD  EPINEPHrine 0.3 mg/0.3 mL IJ SOAJ injection Inject 0.3 mLs (0.3 mg total) into the muscle once. 08/17/14  Yes [name], MD  ferrous sulfate (FEOSOL) 325 (65 FE) MG tablet Take 325 mg by mouth daily with breakfast.    Yes Historical Provider, MD  losartan (COZAAR) 50 MG tablet Take 1 tablet by mouth  daily 01/31/15  Yes [name], MD  metFORMIN (GLUCOPHAGE) 500 MG tablet TAKE ONE TABLET BY MOUTH TWICE DAILY WITH MEALS 05/16/15  Yes [name], MD  metoprolol succinate (TOPROL-XL) 25 MG 24 hr tablet TAKE 1 TABLET (25 MG TOTAL) BY MOUTH DAILY. 01/31/15  Yes [name], MD  Multiple Vitamins-Minerals (ZINC PO) Take 1 tablet by mouth daily.   Yes Historical Provider, MD  omeprazole (PRILOSEC) 20 MG capsule Take 1 capsule (20 mg total) by mouth 2 (two) times daily as needed. 01/31/15  Yes [name], MD  pravastatin (PRAVACHOL) 10 MG tablet Take 1 tablet by mouth  daily 01/31/15  Yes [name], MD  SYMBICORT 160-4.5 MCG/ACT inhaler Use 2 puffs two times daily 07/31/15  Yes [name], MD    Physical Exam: Filed Vitals:   08/07/15 1630 08/07/15 1700 08/07/15 1758 08/07/15 1902  BP: 109/73 97/59 112/54 104/52  Pulse: 88 90 90 98  Temp:   98.6 F (37 C) 99 F (37.2 C)  TempSrc:   Oral   Resp: 21 22 20 20   Height:    5\' 3"   (1.6 m)  Weight:    94.1 kg (207 lb 7.3 oz)  SpO2: 93% 92% 94% 99%      Constitutional: Patient is sickly appearing but nontoxic and able to follow commands  Filed Vitals:   08/07/15 1630 08/07/15 1700 08/07/15 1758 08/07/15 1902  BP: 109/73 97/59 112/54 104/52  Pulse: 88 90 90 98  Temp:   98.6 F (37 C) 99 F (37.2 C)  TempSrc:   Oral   Resp: 21 22 20 20   Height:    5\' 3"  (1.6 m)  Weight:    94.1 kg (207 lb 7.3 oz)  SpO2: 93% 92% 94% 99%   Eyes: PERRL, lids and conjunctivae normal ENMT: Mucous membranes are moist. Posterior pharynx clear of any exudate or lesions.Normal dentition.  Neck: normal, supple, no masses, no thyromegaly Respiratory: Normal inspiratory and expiratory effort. Bilateral wheezes appreciated with decreased breath sounds in left  lower lung base. Rhonchi also appreciated on auscultation. Patient able to speak in full sentences Cardiovascular: Regular rate and rhythm, no murmurs / rubs / gallops. +1 pitting extremity edema. 2+ pedal pulses. No carotid bruits.  Abdomen: no tenderness, no masses palpated. No hepatosplenomegaly. Bowel sounds positive.  Musculoskeletal: no clubbing / cyanosis. No joint deformity upper and lower extremities. Good ROM, no contractures. Normal muscle tone.  Skin: no rashes, lesions, ulcers. No induration Neurologic: CN 2-12 grossly intact. Sensation intact, DTR normal. Strength 4+/5 in all 4.  Psychiatric: Normal judgment and insight. Alert and oriented x 3. Normal mood.     Labs on Admission: I have personally reviewed following labs and imaging studies  CBC:  Recent Labs Lab 08/07/15 1525  WBC 22.0*  NEUTROABS 19.9*  HGB 13.7  HCT 40.8  MCV 92.1  PLT 0000000   Basic Metabolic Panel:  Recent Labs Lab 08/07/15 1525  NA 131*  K 2.9*  CL 95*  CO2 23  GLUCOSE 164*  BUN 14  CREATININE 1.12*  CALCIUM 8.2*   GFR: Estimated Creatinine Clearance: 53.9 mL/min (by C-G formula based on Cr of 1.12). Liver Function  Tests:  Recent Labs Lab 08/07/15 1525  AST 28  ALT 13*  ALKPHOS 40  BILITOT 1.2  PROT 6.6  ALBUMIN 3.4*   No results for input(s): LIPASE, AMYLASE in the last 168 hours. No results for input(s): AMMONIA in the last 168 hours. Coagulation Profile: No results for input(s): INR, PROTIME in the last 168 hours. Cardiac Enzymes:  Recent Labs Lab 08/07/15 1525  TROPONINI 0.04*   BNP (last 3 results) No results for input(s): PROBNP in the last 8760 hours. HbA1C: No results for input(s): HGBA1C in the last 72 hours. CBG: No results for input(s): GLUCAP in the last 168 hours. Lipid Profile: No results for input(s): CHOL, HDL, LDLCALC, TRIG, CHOLHDL, LDLDIRECT in the last 72 hours. Thyroid Function Tests: No results for input(s): TSH, T4TOTAL, FREET4, T3FREE, THYROIDAB in the last 72 hours. Anemia Panel: No results for input(s): VITAMINB12, FOLATE, FERRITIN, TIBC, IRON, RETICCTPCT in the last 72 hours. Urine analysis:    Component Value Date/Time   COLORURINE RED* 01/18/2013 1219   APPEARANCEUR CLOUDY* 01/18/2013 1219   LABSPEC 1.014 01/18/2013 1219   PHURINE 6.5 01/18/2013 1219   GLUCOSEU NEGATIVE 01/18/2013 1219   HGBUR LARGE* 01/18/2013 1219   BILIRUBINUR LARGE* 01/18/2013 1219   KETONESUR 15* 01/18/2013 1219   PROTEINUR >300* 01/18/2013 1219   UROBILINOGEN 1.0 01/18/2013 1219   NITRITE POSITIVE* 01/18/2013 1219   LEUKOCYTESUR MODERATE* 01/18/2013 1219   Sepsis Labs: Recent Results (from the past 240 hour(s))  Blood culture (routine x 2)     Status: None (Preliminary result)   Collection Time: 08/07/15  3:25 PM  Result Value Ref Range Status   Specimen Description BLOOD LEFT ANTECUBITAL  Final   Special Requests BOTTLES DRAWN AEROBIC AND ANAEROBIC 10CC  Final   Culture PENDING  Incomplete   Report Status PENDING  Incomplete  Blood culture (routine x 2)     Status: None (Preliminary result)   Collection Time: 08/07/15  4:05 PM  Result Value Ref Range Status    Specimen Description BLOOD LEFT WRIST  Final   Special Requests BOTTLES DRAWN AEROBIC AND ANAEROBIC  5ML  Final   Culture PENDING  Incomplete   Report Status PENDING  Incomplete     Radiological Exams on Admission: Dg Chest 2 View  08/07/2015  CLINICAL DATA:  Fever, chills, sweats EXAM: CHEST  2 VIEW COMPARISON:  01/18/2013 FINDINGS: There is left lower lobe airspace disease. There is no pleural effusion or pneumothorax. The heart and mediastinal contours are unremarkable. The osseous structures are unremarkable. IMPRESSION: Left lower lobe pneumonia. Followup PA and lateral chest X-ray is recommended in 3-4 weeks following trial of antibiotic therapy to ensure resolution and exclude underlying malignancy. Electronically Signed   By: Kathreen Devoid   On: 08/07/2015 14:31    EKG: Independently reviewed. Sinus  tachycardia with short PR interval and prolonged QTC of 527 and nonspecific ST-T wave changes  Assessment/Plan Sepsis secondary to community-acquired pneumonia with COPD exacerbation: Acute. Patient with one week history of cough and congestion. CXR revealing a left lower lobe infiltrate. Patient started on ceftriaxone and azithromycin given initial fluid boluses. - Admit to telemetry bed - Continuous pulse oximetry with nasal cannula oxygen to keep O2 sats greater than 92% - Check lactic acid - Follow-up sputum, UA, and blood cultures  - Continue azithromycin and ceftriaxone - Prednisone 40 mg by mouth daily - Duoneb q 4hr prn shortness of breath or wheezing - Budesonide and Brovana nebs q12hrs  - mucinex - Tessalon Perles when necessary  Elevated troponin with abnormal EKG: Initial troponin is elevated 0.04 on admission suspected secondary to acute demand with underlying pneumonia. Patient with sinus tachycardia and prolonged QTC on EKG suspected secondary to electrolyte abnormalities. Radene Knee cardiac troponins  - Recheck EKG in a.m. - Correcting electrolyte abnormalities    Hypokalemia: Potassium on admission 2.9 - 60 mEq of potassium chloride 1 dose now  -Follow-up repeat BMP in am and replace as needed     Acute kidney injury: Baseline creatinine 0.69 creatinine elevated to 1.12 on admission with a BUN of 19. Suspect prerenal in nature. - IV fluids NS at 75 ml/hr overnight  - Follow-up repeat BMP in a.m.    Headache - tylenol prn  Nausea: Acute - Compazine prn   Diabetes mellitus type II: Patient previously well-controlled on oral agents of metformin. On admission patient's blood glucose elevated at 164. - Held the metformin - CBGs every before meals  with sensitive sliding scale of insulin  Essential hypertension - Continue metoprolol and Cozaar tomorrow as tolerated by blood pressure   Hyperlipidemia - Continue pravastatin  Depression - Continue Celexa  GERD  -  pharmacy substitution of Protonix for omeprazole   Hyponatremia: Sodium 131 on admission - Follow-up repeat bmp after IV fluids   Insomnia  - Ambien   DVT prophylax: Lovenox   Code Status: Full Family Communication:  Discussed plan with the patient and daughter daughter is present at bedside Disposition Plan:  Possible discharge home in 1-2 days  Consults called: None Admission status: Observation telemetrry  Norval Morton MD [facility] Pager [phone]  If 7PM-7AM, please contact night-coverage [email or web address] Password [facility]  08/07/2015, 8:57 PM

## 2015-08-07 NOTE — ED Provider Notes (Signed)
CSN: [id number]     Arrival date & time 08/07/15  1343 History   First MD Initiated Contact with Patient 08/07/15 1438     Chief Complaint  Patient presents with  . Weakness     (Consider location/radiation/quality/duration/timing/severity/associated sxs/prior Treatment) HPI Comments: Patient is a 65 year old female with past medical history of COPD, hypertension, and type 2 diabetes. She presents for evaluation of chest congestion and productive cough. She started 1 week ago with cold-like symptoms which have progressed. She is now coughing up phlegm. She reports having no appetite and decreased by mouth intake. She reports little energy. She was seen at the [facility] clinic upstairs, then sent to the emergency department for further evaluation due to hypotension and tachycardia.  Patient is a 65 y.o. female presenting with weakness. The history is provided by the patient.  Weakness This is a new problem. Episode onset: 1 week ago. The problem occurs constantly. The problem has been gradually worsening. Associated symptoms include shortness of breath. Pertinent negatives include no chest pain. Nothing aggravates the symptoms. Nothing relieves the symptoms. She has tried nothing for the symptoms. The treatment provided no relief.    Past Medical History  Diagnosis Date  . Hypertension   . GERD (gastroesophageal reflux disease)   . H/O hiatal hernia   . Type 2 diabetes mellitus ([place])   . Iron deficiency anemia   . Bladder cancer (HCC) UROLOGIST-- DR Diona Fanti    HIGH-GRADE UROTHELIAL CARCINOMA  . Depression   . COPD (chronic obstructive pulmonary disease) ([place])   . Murmur, heart 05/20/2014  . Diabetes mellitus type 2 in obese ([place]) 01/31/2015   Past Surgical History  Procedure Laterality Date  . Transurethral resection of bladder tumor with gyrus (turbt-gyrus) N/A 01/23/2013    Procedure: TRANSURETHRAL RESECTION OF BLADDER TUMOR WITH GYRUS ;  Surgeon: Franchot Gallo, MD;  Location: [facility];  Service: Urology;  Laterality: N/A;  . Appendectomy  1980'S  . Tubal ligation  1980'S  . Transurethral resection of bladder tumor N/A 02/16/2013    Procedure: TRANSURETHRAL RESECTION OF BLADDER TUMOR (TURBT);  Surgeon: Franchot Gallo, MD;  Location: [facility];  Service: Urology;  Laterality: N/A;  . Transurethral resection of bladder tumor N/A 12/18/2013    Procedure: TRANSURETHRAL RESECTION OF BLADDER TUMOR (TURBT) ;  Surgeon: Jorja Loa, MD;  Location: [facility];  Service: Urology;  Laterality: N/A;  . Cystoscopy N/A 12/18/2013    Procedure: CYSTOSCOPY;  Surgeon: Jorja Loa, MD;  Location: [facility];  Service: Urology;  Laterality: N/A;   Family History  Problem Relation Age of Onset  . Cancer Father     prostate  . Arthritis Brother    Social History  Substance Use Topics  . Smoking status: Former Smoker -- 1.00 packs/day for 35 years    Types: Cigarettes    Quit date: 07/15/2010  . Smokeless tobacco: Never Used  . Alcohol Use: No   OB History    No data available     Review of Systems  Respiratory: Positive for shortness of breath.   Cardiovascular: Negative for chest pain.  Neurological: Positive for weakness.  All other systems reviewed and are negative.     Allergies  Bee venom; Other; and Latex  Home Medications   Prior to Admission medications   Medication Sig Start Date End Date Taking? Authorizing Provider  albuterol (PROVENTIL HFA;VENTOLIN HFA) 108 (90 BASE) MCG/ACT inhaler Inhale 2 puffs into the lungs every 6 (  six) hours as needed for wheezing or shortness of breath. Please dispense ProAir 11/24/13   [name], MD  Calcium Citrate-Vitamin D (CITRACAL + D PO) Take 2 tablets by mouth 2 (two) times daily.     Historical Provider, MD  citalopram (CELEXA) 40 MG tablet Take 1 tablet (40 mg total) by mouth daily. 04/01/15   [name], MD  Coenzyme Q10 (COQ10) 200 MG CAPS Take 1  capsule by mouth daily.    Historical Provider, MD  EPINEPHrine 0.3 mg/0.3 mL IJ SOAJ injection Inject 0.3 mLs (0.3 mg total) into the muscle once. Patient not taking: Reported on 03/19/2015 08/17/14   [name], MD  ferrous sulfate (FEOSOL) 325 (65 FE) MG tablet Take 325 mg by mouth daily with breakfast.     Historical Provider, MD  losartan (COZAAR) 50 MG tablet Take 1 tablet by mouth  daily 01/31/15   [name], MD  metFORMIN (GLUCOPHAGE) 500 MG tablet TAKE ONE TABLET BY MOUTH TWICE DAILY WITH MEALS 05/16/15   [name], MD  metoprolol succinate (TOPROL-XL) 25 MG 24 hr tablet TAKE 1 TABLET (25 MG TOTAL) BY MOUTH DAILY. 01/31/15   [name], MD  Multiple Vitamins-Minerals (ZINC PO) Take 1 tablet by mouth daily.    Historical Provider, MD  omeprazole (PRILOSEC) 20 MG capsule Take 1 capsule (20 mg total) by mouth 2 (two) times daily as needed. 01/31/15   [name], MD  pravastatin (PRAVACHOL) 10 MG tablet Take 1 tablet by mouth  daily 01/31/15   [name], MD  SYMBICORT 160-4.5 MCG/ACT inhaler Use 2 puffs two times daily 07/31/15   [name], MD   BP 91/52 mmHg  Temp(Src) 98.3 F (36.8 C) (Oral)  Resp 20  Ht 5\' 3"  (1.6 m)  Wt 200 lb (90.719 kg)  BMI 35.44 kg/m2  SpO2 96% Physical Exam  Constitutional: She is oriented to person, place, and time. She appears well-developed and well-nourished. No distress.  HENT:  Head: Normocephalic and atraumatic.  Neck: Normal range of motion. Neck supple.  Cardiovascular: Normal rate and regular rhythm.  Exam reveals no gallop and no friction rub.   No murmur heard. Pulmonary/Chest: Effort normal. No respiratory distress. She has no wheezes. She has rales.  There are rales in the left base.  Abdominal: Soft. Bowel sounds are normal. She exhibits no distension. There is no tenderness.  Musculoskeletal: Normal range of motion.  Neurological: She is alert and oriented to person, place, and time.  Skin: Skin is warm and dry.  She is not diaphoretic.  Nursing note and vitals reviewed.   ED Course  Procedures (including critical care time) Labs Review Labs Reviewed  CBC WITH DIFFERENTIAL/PLATELET  COMPREHENSIVE METABOLIC PANEL  TROPONIN I  URINALYSIS, ROUTINE W REFLEX MICROSCOPIC (NOT AT [facility])    Imaging Review Dg Chest 2 View  08/07/2015  CLINICAL DATA:  Fever, chills, sweats EXAM: CHEST  2 VIEW COMPARISON:  01/18/2013 FINDINGS: There is left lower lobe airspace disease. There is no pleural effusion or pneumothorax. The heart and mediastinal contours are unremarkable. The osseous structures are unremarkable. IMPRESSION: Left lower lobe pneumonia. Followup PA and lateral chest X-ray is recommended in 3-4 weeks following trial of antibiotic therapy to ensure resolution and exclude underlying malignancy. Electronically Signed   By: Kathreen Devoid   On: 08/07/2015 14:31   I have personally reviewed and evaluated these images and lab results as part of my medical decision-making.  EKG Interpretation   Date/Time:  Wednesday Aug 07 2015 13:58:55 EDT Ventricular Rate:  108 PR Interval:  100 QRS Duration: 92 QT Interval:  394 QTC Calculation: 527 R Axis:   38 Text Interpretation:  Sinus tachycardia with short PR with Premature  atrial complexes Nonspecific ST abnormality Prolonged QT Abnormal ECG  Confirmed by Jakeb Lamping  MD, Nathaneil Canary ([id number]) on 08/07/2015 2:44:57 PM Also  confirmed by Stark Jock  MD, Alyaan Budzynski ([id number]), editor Gilford Rile, CCT, [place] ([id number])   on 08/07/2015 3:02:07 PM      MDM   Final diagnoses:  None    Patient presents with a one-week history of worsening cough and chest congestion. She was sent from upstairs due to low blood pressure and rapid heart rate. She was slightly hypotensive upon presentation, however this corrected with fluids. She is found to have a left lower lobe pneumonia and a white count of 22,000. She will be admitted for IV antibiotics, closer observation. I've spoken with Dr. Marily Memos  from the hospitalist service who agrees to admit.    Veryl Speak, MD 08/07/15 1600

## 2015-08-08 DIAGNOSIS — K219 Gastro-esophageal reflux disease without esophagitis: Secondary | ICD-10-CM | POA: Diagnosis present

## 2015-08-08 DIAGNOSIS — R51 Headache: Secondary | ICD-10-CM | POA: Diagnosis present

## 2015-08-08 DIAGNOSIS — Z7984 Long term (current) use of oral hypoglycemic drugs: Secondary | ICD-10-CM | POA: Diagnosis not present

## 2015-08-08 DIAGNOSIS — J449 Chronic obstructive pulmonary disease, unspecified: Secondary | ICD-10-CM

## 2015-08-08 DIAGNOSIS — G4700 Insomnia, unspecified: Secondary | ICD-10-CM | POA: Diagnosis present

## 2015-08-08 DIAGNOSIS — E876 Hypokalemia: Secondary | ICD-10-CM | POA: Diagnosis present

## 2015-08-08 DIAGNOSIS — J441 Chronic obstructive pulmonary disease with (acute) exacerbation: Secondary | ICD-10-CM | POA: Diagnosis not present

## 2015-08-08 DIAGNOSIS — J189 Pneumonia, unspecified organism: Secondary | ICD-10-CM | POA: Diagnosis not present

## 2015-08-08 DIAGNOSIS — E7800 Pure hypercholesterolemia, unspecified: Secondary | ICD-10-CM | POA: Diagnosis present

## 2015-08-08 DIAGNOSIS — E119 Type 2 diabetes mellitus without complications: Secondary | ICD-10-CM | POA: Diagnosis present

## 2015-08-08 DIAGNOSIS — T380X5A Adverse effect of glucocorticoids and synthetic analogues, initial encounter: Secondary | ICD-10-CM | POA: Diagnosis present

## 2015-08-08 DIAGNOSIS — J440 Chronic obstructive pulmonary disease with acute lower respiratory infection: Secondary | ICD-10-CM | POA: Diagnosis not present

## 2015-08-08 DIAGNOSIS — R9431 Abnormal electrocardiogram [ECG] [EKG]: Secondary | ICD-10-CM | POA: Diagnosis present

## 2015-08-08 DIAGNOSIS — Z8551 Personal history of malignant neoplasm of bladder: Secondary | ICD-10-CM | POA: Diagnosis not present

## 2015-08-08 DIAGNOSIS — Z951 Presence of aortocoronary bypass graft: Secondary | ICD-10-CM | POA: Diagnosis not present

## 2015-08-08 DIAGNOSIS — N179 Acute kidney failure, unspecified: Secondary | ICD-10-CM | POA: Diagnosis not present

## 2015-08-08 DIAGNOSIS — E871 Hypo-osmolality and hyponatremia: Secondary | ICD-10-CM | POA: Diagnosis present

## 2015-08-08 DIAGNOSIS — Z79899 Other long term (current) drug therapy: Secondary | ICD-10-CM | POA: Diagnosis not present

## 2015-08-08 DIAGNOSIS — I10 Essential (primary) hypertension: Secondary | ICD-10-CM | POA: Diagnosis present

## 2015-08-08 DIAGNOSIS — R748 Abnormal levels of other serum enzymes: Secondary | ICD-10-CM | POA: Diagnosis present

## 2015-08-08 DIAGNOSIS — Z87891 Personal history of nicotine dependence: Secondary | ICD-10-CM | POA: Diagnosis not present

## 2015-08-08 DIAGNOSIS — R509 Fever, unspecified: Secondary | ICD-10-CM | POA: Diagnosis present

## 2015-08-08 DIAGNOSIS — R110 Nausea: Secondary | ICD-10-CM | POA: Diagnosis present

## 2015-08-08 DIAGNOSIS — F329 Major depressive disorder, single episode, unspecified: Secondary | ICD-10-CM | POA: Diagnosis present

## 2015-08-08 DIAGNOSIS — E785 Hyperlipidemia, unspecified: Secondary | ICD-10-CM | POA: Diagnosis present

## 2015-08-08 DIAGNOSIS — A419 Sepsis, unspecified organism: Secondary | ICD-10-CM | POA: Diagnosis not present

## 2015-08-08 LAB — BASIC METABOLIC PANEL
ANION GAP: 10 (ref 5–15)
BUN: 19 mg/dL (ref 6–20)
CALCIUM: 8.4 mg/dL — AB (ref 8.9–10.3)
CO2: 24 mmol/L (ref 22–32)
Chloride: 99 mmol/L — ABNORMAL LOW (ref 101–111)
Creatinine, Ser: 1.18 mg/dL — ABNORMAL HIGH (ref 0.44–1.00)
GFR calc Af Amer: 54 mL/min — ABNORMAL LOW (ref >60)
GFR, EST NON AFRICAN AMERICAN: 47 mL/min — AB (ref >60)
GLUCOSE: 144 mg/dL — AB (ref 65–99)
POTASSIUM: 3.8 mmol/L (ref 3.5–5.1)
Sodium: 133 mmol/L — ABNORMAL LOW (ref 135–145)

## 2015-08-08 LAB — URINALYSIS, ROUTINE W REFLEX MICROSCOPIC
BILIRUBIN URINE: NEGATIVE
Glucose, UA: NEGATIVE mg/dL
Ketones, ur: 15 mg/dL — AB
Nitrite: NEGATIVE
PH: 5.5 (ref 5.0–8.0)
Protein, ur: NEGATIVE mg/dL
SPECIFIC GRAVITY, URINE: 1.017 (ref 1.005–1.030)

## 2015-08-08 LAB — CBC WITH DIFFERENTIAL/PLATELET
Basophils Absolute: 0 10*3/uL (ref 0.0–0.1)
Basophils Relative: 0 %
EOS PCT: 0 %
Eosinophils Absolute: 0 10*3/uL (ref 0.0–0.7)
HEMATOCRIT: 39.2 % (ref 36.0–46.0)
Hemoglobin: 12.8 g/dL (ref 12.0–15.0)
Lymphocytes Relative: 4 %
Lymphs Abs: 0.7 10*3/uL (ref 0.7–4.0)
MCH: 29.6 pg (ref 26.0–34.0)
MCHC: 32.7 g/dL (ref 30.0–36.0)
MCV: 90.5 fL (ref 78.0–100.0)
MONO ABS: 0.5 10*3/uL (ref 0.1–1.0)
Monocytes Relative: 2 %
Neutro Abs: 18.1 10*3/uL — ABNORMAL HIGH (ref 1.7–7.7)
Neutrophils Relative %: 94 %
PLATELETS: 183 10*3/uL (ref 150–400)
RBC: 4.33 MIL/uL (ref 3.87–5.11)
RDW: 12.8 % (ref 11.5–15.5)
WBC: 19.3 10*3/uL — AB (ref 4.0–10.5)

## 2015-08-08 LAB — TROPONIN I
TROPONIN I: 0.03 ng/mL (ref <0.031)
Troponin I: 0.07 ng/mL — ABNORMAL HIGH (ref <0.031)

## 2015-08-08 LAB — STREP PNEUMONIAE URINARY ANTIGEN: Strep Pneumo Urinary Antigen: NEGATIVE

## 2015-08-08 LAB — URINE MICROSCOPIC-ADD ON

## 2015-08-08 LAB — GLUCOSE, CAPILLARY
Glucose-Capillary: 180 mg/dL — ABNORMAL HIGH (ref 65–99)
Glucose-Capillary: 209 mg/dL — ABNORMAL HIGH (ref 65–99)
Glucose-Capillary: 184 mg/dL — ABNORMAL HIGH (ref 65–99)
Glucose-Capillary: 179 mg/dL — ABNORMAL HIGH (ref 65–99)
Glucose-Capillary: 166 mg/dL — ABNORMAL HIGH (ref 65–99)

## 2015-08-08 MED ORDER — BOOST / RESOURCE BREEZE PO LIQD
1.0000 | ORAL | Status: DC
  Administered 2015-08-08 – 2015-08-09 (×2): 1 via ORAL

## 2015-08-08 MED ORDER — INSULIN GLARGINE 100 UNIT/ML ~~LOC~~ SOLN
5.0000 [IU] | Freq: Every day | SUBCUTANEOUS | Status: DC
  Administered 2015-08-08 – 2015-08-10 (×3): 5 [IU] via SUBCUTANEOUS
  Filled 2015-08-08 (×3): qty 0.05

## 2015-08-08 MED ORDER — METOPROLOL SUCCINATE ER 25 MG PO TB24
25.0000 mg | ORAL_TABLET | Freq: Every day | ORAL | Status: DC
  Administered 2015-08-08 – 2015-08-10 (×3): 25 mg via ORAL
  Filled 2015-08-08 (×3): qty 1

## 2015-08-08 NOTE — Care Management Obs Status (Signed)
[place] NOTIFICATION   Patient Details  Name: Savannah Stanley MRN: [id number] Date of Birth: 06/06/1950  CC44 Medicare Observation Status Notification Given:  Yes    Carles Collet, RN 08/08/2015, 9:19 AM

## 2015-08-08 NOTE — Progress Notes (Signed)
PROGRESS NOTE                                                                                                                                                                                                             Patient Demographics:    Savannah Stanley, is a 65 y.o. female, DOB - 13-Oct-1950, [id number]  Admit date - 08/07/2015   Admitting Physician Waldemar Dickens, MD  Outpatient Primary MD for the patient is Penni Homans, MD  LOS - 1  Chief Complaint  Patient presents with  . Weakness       Brief Narrative   64 y.o. female with medical history significant of HTN, DM type II, COPD, HLD, gerd, and bladder cancer s/p resection; who presents with complaints of generalized weakness with cough and congestion, admitted for COPD exacerbation and pneumonia   Subjective:    Savannah Stanley today has, No headache, No chest pain, No abdominal pain - No Nausea, Complains of cough  Assessment  & Plan :    Principal Problem:   Sepsis ([place]) Active Problems:   COPD (chronic obstructive pulmonary disease) ([place])   Depression   Insomnia   Hypertension   Hyperlipidemia   Hypokalemia   CAP (community acquired pneumonia)   Elevated troponin   Nonspecific abnormal electrocardiogram (ECG) (EKG)   CAP - Patient presents with cough, productive sputum, chest x-ray with left lower lobe infiltrate - Continue with IV Rocephin and azithromycin - Follow blood cultures, sputum cultures  COPD exacerbation - Continue with  steroids, nebs,Budesonide and Brovana nebs q12hrs   Diabetes mellitus - Continue to hold metformin, will start on insulin sliding scale, CABG uncontrolled most likely due to steroids, will start on low dose Lantus  Hypokalemia - Repleted, recheck in a.m.  Borderline troponins - Non-ACS pattern,0.04>0.04>0.07>0.03 this is most likely related to sepsis  Essential hypertension - Continue metoprolol and Cozaar tomorrow as tolerated by  blood pressure   Hyperlipidemia - Continue pravastatin  Depression - Continue Celexa  GERD  - PPI  Hyponatremia - Sodium 131 on admission, improved with IV fluids  Insomnia  - Ambien   Code Status : Full  Family Communication  : Daughter at bedside  Disposition Plan  :Home when stable   Consults  :  None  Procedures  : None  DVT Prophylaxis  :  Lovenox  Lab Results  Component Value Date   PLT 183 08/08/2015    Antibiotics  :    Anti-infectives    Start     Dose/Rate Route Frequency Ordered Stop   08/08/15 1700  azithromycin (ZITHROMAX) 500 mg in dextrose 5 % 250 mL IVPB     500 mg 250 mL/hr over 60 Minutes Intravenous Every 24 hours 08/07/15 2134 08/14/15 1659   08/08/15 1600  cefTRIAXone (ROCEPHIN) 1 g in dextrose 5 % 50 mL IVPB     1 g 100 mL/hr over 30 Minutes Intravenous Every 24 hours 08/07/15 2134 08/14/15 1559   08/07/15 1723  azithromycin (ZITHROMAX) 500 MG injection    Comments:  Hartley, Amy   : cabinet override      08/07/15 1723 08/08/15 0529   08/07/15 1545  cefTRIAXone (ROCEPHIN) 1 g in dextrose 5 % 50 mL IVPB     1 g 100 mL/hr over 30 Minutes Intravenous  Once 08/07/15 1538 08/07/15 1642   08/07/15 1545  azithromycin (ZITHROMAX) 500 mg in dextrose 5 % 250 mL IVPB     500 mg 250 mL/hr over 60 Minutes Intravenous  Once 08/07/15 1538 08/07/15 1827        Objective:   Filed Vitals:   08/07/15 1902 08/08/15 0446 08/08/15 0605 08/08/15 1315  BP: 104/52  94/53 111/49  Pulse: 98 85 88 92  Temp: 99 F (37.2 C)  97.6 F (36.4 C) 98 F (36.7 C)  TempSrc:    Oral  Resp: 20 18 18 20   Height: 5\' 3"  (1.6 m)     Weight: 94.1 kg (207 lb 7.3 oz)     SpO2: 99% 98% 92% 93%    Wt Readings from Last 3 Encounters:  08/07/15 94.1 kg (207 lb 7.3 oz)  08/07/15 90.719 kg (200 lb)  07/15/15 95.766 kg (211 lb 2 oz)     Intake/Output Summary (Last 24 hours) at 08/08/15 1425 Last data filed at 08/08/15 1306  Gross per 24 hour  Intake   1162 ml    Output    675 ml  Net    487 ml     Physical Exam  Awake Alert, Oriented X 3,  Supple Neck,No JVD,  Symmetrical Chest wall movement, Diminished air movement bilaterally,  RRR,No Gallops,Rubs or new Murmurs, No Parasternal Heave +ve B.Sounds, Abd Soft, No tenderness, No rebound - guarding or rigidity. No Cyanosis, Clubbing or edema, No new Rash or bruise      Data Review:    CBC  Recent Labs Lab 08/07/15 1525 08/08/15 0524  WBC 22.0* 19.3*  HGB 13.7 12.8  HCT 40.8 39.2  PLT 179 183  MCV 92.1 90.5  MCH 30.9 29.6  MCHC 33.6 32.7  RDW 12.7 12.8  LYMPHSABS 0.9 0.7  MONOABS 1.2* 0.5  EOSABS 0.0 0.0  BASOSABS 0.0 0.0    Chemistries   Recent Labs Lab 08/07/15 1525 08/08/15 0115  NA 131* 133*  K 2.9* 3.8  CL 95* 99*  CO2 23 24  GLUCOSE 164* 144*  BUN 14 19  CREATININE 1.12* 1.18*  CALCIUM 8.2* 8.4*  AST 28  --   ALT 13*  --   ALKPHOS 40  --   BILITOT 1.2  --    ------------------------------------------------------------------------------------------------------------------ No results for input(s): CHOL, HDL, LDLCALC, TRIG, CHOLHDL, LDLDIRECT in the last 72 hours.  Lab Results  Component Value Date   HGBA1C 6.5 07/04/2015   ------------------------------------------------------------------------------------------------------------------ No results for input(s): TSH, T4TOTAL, T3FREE, THYROIDAB in  the last 72 hours.  Invalid input(s): FREET3 ------------------------------------------------------------------------------------------------------------------ No results for input(s): VITAMINB12, FOLATE, FERRITIN, TIBC, IRON, RETICCTPCT in the last 72 hours.  Coagulation profile No results for input(s): INR, PROTIME in the last 168 hours.  No results for input(s): DDIMER in the last 72 hours.  Cardiac Enzymes  Recent Labs Lab 08/07/15 2203 08/08/15 0115 08/08/15 0524  TROPONINI 0.04* 0.07* 0.03    ------------------------------------------------------------------------------------------------------------------ No results found for: BNP  Inpatient Medications  Scheduled Meds: . arformoterol  15 mcg Nebulization BID  . azithromycin  500 mg Intravenous Q24H  . budesonide (PULMICORT) nebulizer solution  0.5 mg Nebulization BID  . cefTRIAXone (ROCEPHIN)  IV  1 g Intravenous Q24H  . citalopram  40 mg Oral Daily  . enoxaparin (LOVENOX) injection  40 mg Subcutaneous Q24H  . feeding supplement  1 Container Oral Q24H  . guaiFENesin  600 mg Oral BID  . insulin aspart  0-9 Units Subcutaneous TID WC  . losartan  50 mg Oral Daily  . metoprolol succinate  25 mg Oral Daily  . pantoprazole  40 mg Oral Daily  . pravastatin  10 mg Oral q1800  . predniSONE  40 mg Oral Q breakfast   Continuous Infusions: . sodium chloride 75 mL/hr at 08/08/15 1212   PRN Meds:.acetaminophen, benzonatate, ipratropium-albuterol, prochlorperazine, zolpidem  Micro Results Recent Results (from the past 240 hour(s))  Blood culture (routine x 2)     Status: None (Preliminary result)   Collection Time: 08/07/15  3:25 PM  Result Value Ref Range Status   Specimen Description BLOOD LEFT ANTECUBITAL  Final   Special Requests BOTTLES DRAWN AEROBIC AND ANAEROBIC 10CC  Final   Culture PENDING  Incomplete   Report Status PENDING  Incomplete  Blood culture (routine x 2)     Status: None (Preliminary result)   Collection Time: 08/07/15  4:05 PM  Result Value Ref Range Status   Specimen Description BLOOD LEFT WRIST  Final   Special Requests BOTTLES DRAWN AEROBIC AND ANAEROBIC  5ML  Final   Culture PENDING  Incomplete   Report Status PENDING  Incomplete    Radiology Reports Dg Chest 2 View  08/07/2015  CLINICAL DATA:  Fever, chills, sweats EXAM: CHEST  2 VIEW COMPARISON:  01/18/2013 FINDINGS: There is left lower lobe airspace disease. There is no pleural effusion or pneumothorax. The heart and mediastinal contours are  unremarkable. The osseous structures are unremarkable. IMPRESSION: Left lower lobe pneumonia. Followup PA and lateral chest X-ray is recommended in 3-4 weeks following trial of antibiotic therapy to ensure resolution and exclude underlying malignancy. Electronically Signed   By: Kathreen Devoid   On: 08/07/2015 14:31    Time Spent in minutes  25 minutes   Gudelia Eugene M.D on 08/08/2015 at 2:25 PM  Between 7am to 7pm - Pager - [phone]  After 7pm go to [email or web address] - password [facility]  [facility] -  Office  [phone]

## 2015-08-08 NOTE — Care Management Note (Signed)
Case Management Note  Patient Details  Name: Savannah Stanley MRN: [id number] Date of Birth: 12-01-1950  Subjective/Objective:                 Spoke with patient and daughter in room. Patient lives at home by herself, but her daughter will be staying with her after discharge. She drives and is active, does not use DME (has nebulizer from years ago no in use). Denies needing assistance for medications.    Action/Plan:  CM will continue to follow for DC planning.   Expected Discharge Date:                  Expected Discharge Plan:  Home/Self Care  In-House Referral:     Discharge planning Services  CM Consult  Post Acute Care Choice:  NA Choice offered to:     DME Arranged:    DME Agency:     HH Arranged:    HH Agency:     Status of Service:  In process, will continue to follow  Medicare Important Message Given:    Date Medicare IM Given:    Medicare IM give by:    Date Additional Medicare IM Given:    Additional Medicare Important Message give by:     If discussed at [place] of Stay Meetings, dates discussed:    Additional Comments:  Carles Collet, RN 08/08/2015, 9:22 AM

## 2015-08-08 NOTE — Progress Notes (Addendum)
Initial Nutrition Assessment  DOCUMENTATION CODES:   Obesity unspecified  INTERVENTION:   -D/c Ensure Enlive po BID, each supplement provides 350 kcal and 20 grams of protein, due to poor acceptance -Boost Breeze po daily, each supplement provides 250 kcal and 9 grams of protein  NUTRITION DIAGNOSIS:   Inadequate oral intake related to poor appetite as evidenced by per patient/family report.  GOAL:   Patient will meet greater than or equal to 90% of their needs  MONITOR:   PO intake, Supplement acceptance, Labs, Weight trends, Skin, I & O's  REASON FOR ASSESSMENT:   Malnutrition Screening Tool    ASSESSMENT:   Savannah Stanley is a 65 y.o. female with medical history significant of HTN, DM type II, COPD, HLD, gerd, and bladder cancer s/p resection; who presents with complaints of generalized weakness with cough and congestion.  Pt admitted with sepsis secondary to CAP and COPD exacerbation.   Pt reports that her appetite is generally good, consuming 3 meals per day. She denies any changes to her eating habits until 3-4 days ago, when she experienced very poor appetite ("I just couldn't eat"). Pt reports her appetite is slowly returning, consumed a few bites of grits and eggs at breakfast.   Pt reports a 7# wt loss within the past week, however, per her report, UBW is around 205# (which is consistent with wt hx). Pt explains that she recently went on vacation and gained about 5#, and is now back to her UBW as a result of her illness. Pt expressed that she is not concerned about her weight loss at this time.   Pt consumed about 25% of Ensure supplement; she shares with this RD that she did not care for the taste of supplement. Pt amenable to Boost Breeze. RD to order.   Nutrition-Focused physical exam completed. Findings are no fat depletion, no muscle depletion, and no edema.   Discussed importance of good meal and supplement intake to promote healing. Pt very appreciative of  visit, but denies any further concerns at this time.   Labs reviewed: CBGS: 184-209.   Diet Order:  Diet Heart Room service appropriate?: Yes; Fluid consistency:: Thin  Skin:  Reviewed, no issues  Last BM:  08/07/15  Height:   Ht Readings from Last 1 Encounters:  08/07/15 5\' 3"  (1.6 m)    Weight:   Wt Readings from Last 1 Encounters:  08/07/15 207 lb 7.3 oz (94.1 kg)    Ideal Body Weight:  52.3 kg  BMI:  Body mass index is 36.76 kg/(m^2).  Estimated Nutritional Needs:   Kcal:  1650-1850  Protein:  70-85 grams  Fluid:  1.6-1.8 L  EDUCATION NEEDS:   Education needs addressed  Savannah Stanley, RD, LDN, CDE Pager: [phone] After hours Pager: [phone]

## 2015-08-09 DIAGNOSIS — J441 Chronic obstructive pulmonary disease with (acute) exacerbation: Secondary | ICD-10-CM

## 2015-08-09 LAB — GLUCOSE, CAPILLARY
GLUCOSE-CAPILLARY: 164 mg/dL — AB (ref 65–99)
Glucose-Capillary: 146 mg/dL — ABNORMAL HIGH (ref 65–99)
Glucose-Capillary: 312 mg/dL — ABNORMAL HIGH (ref 65–99)
Glucose-Capillary: 197 mg/dL — ABNORMAL HIGH (ref 65–99)

## 2015-08-09 LAB — CBC
HCT: 37.2 % (ref 36.0–46.0)
Hemoglobin: 11.9 g/dL — ABNORMAL LOW (ref 12.0–15.0)
MCH: 29.2 pg (ref 26.0–34.0)
MCHC: 32 g/dL (ref 30.0–36.0)
MCV: 91.4 fL (ref 78.0–100.0)
Platelets: 198 K/uL (ref 150–400)
RBC: 4.07 MIL/uL (ref 3.87–5.11)
RDW: 12.9 % (ref 11.5–15.5)
WBC: 11.3 K/uL — ABNORMAL HIGH (ref 4.0–10.5)

## 2015-08-09 LAB — BASIC METABOLIC PANEL WITH GFR
Anion gap: 9 (ref 5–15)
BUN: 16 mg/dL (ref 6–20)
CO2: 23 mmol/L (ref 22–32)
Calcium: 8.9 mg/dL (ref 8.9–10.3)
Chloride: 106 mmol/L (ref 101–111)
Creatinine, Ser: 0.64 mg/dL (ref 0.44–1.00)
GFR calc Af Amer: >60 mL/min
GFR calc non Af Amer: >60 mL/min
Glucose, Bld: 121 mg/dL — ABNORMAL HIGH (ref 65–99)
Potassium: 3.7 mmol/L (ref 3.5–5.1)
Sodium: 138 mmol/L (ref 135–145)

## 2015-08-09 MED ORDER — POTASSIUM CHLORIDE CRYS ER 20 MEQ PO TBCR
40.0000 meq | EXTENDED_RELEASE_TABLET | Freq: Once | ORAL | Status: AC
  Administered 2015-08-09: 40 meq via ORAL
  Filled 2015-08-09: qty 2

## 2015-08-09 MED ORDER — METHYLPREDNISOLONE SODIUM SUCC 125 MG IJ SOLR
60.0000 mg | Freq: Three times a day (TID) | INTRAMUSCULAR | Status: DC
  Administered 2015-08-09 – 2015-08-10 (×4): 60 mg via INTRAVENOUS
  Filled 2015-08-09 (×5): qty 2

## 2015-08-09 NOTE — Progress Notes (Signed)
PT Cancellation Note  Patient Details Name: Savannah Stanley MRN: [id number] DOB: [birth year]   Cancelled Treatment:    Reason Eval/Treat Not Completed: PT screened, no needs identified, will sign off (observed pt ambulating in halls independently several times today (no loss of balance), she is not in need of PT. Her daughter is a PT and stated pt doesn't need PT. )   [name] 08/09/2015, 2:31 PM [phone]

## 2015-08-09 NOTE — Progress Notes (Addendum)
PROGRESS NOTE                                                                                                                                                                                                             Patient Demographics:    Savannah Stanley, is a 65 y.o. female, DOB - 05/10/1950, [id number]  Admit date - 08/07/2015   Admitting Physician Waldemar Dickens, MD  Outpatient Primary MD for the patient is Penni Homans, MD  LOS - 2  Chief Complaint  Patient presents with  . Weakness       Brief Narrative   65 y.o. female with medical history significant of HTN, DM type II, COPD, HLD, gerd, and bladder cancer s/p resection; who presents with complaints of generalized weakness with cough and congestion, admitted for COPD exacerbation and pneumonia   Subjective:    Savannah Stanley today has, No headache, No chest pain, No abdominal pain - No Nausea, Complains of cough  Assessment  & Plan :    Principal Problem:   Sepsis ([place]) Active Problems:   COPD (chronic obstructive pulmonary disease) ([place])   Depression   Insomnia   Hypertension   Hyperlipidemia   Hypokalemia   CAP (community acquired pneumonia)   Elevated troponin   Nonspecific abnormal electrocardiogram (ECG) (EKG)   CAP - Patient presents with cough, productive sputum, chest x-ray with left lower lobe infiltrate - Continue with IV Rocephin and azithromycin - Follow blood cultures, sputum cultures - Will need follow-up 2 view chest x-ray to ensure resolution of pneumonia and no malignancy  COPD exacerbation - Continue with  Steroids (Will change back to IV steroids giving wheezing), nebs,Budesonide and Brovana nebs q12hrs   Diabetes mellitus - Continue to hold metformin, will start on insulin sliding scale, CABG  better controlled after starting low-dose Lantus .   Hypokalemia - Repleted,   Borderline troponins - Non-ACS pattern,0.04>0.04>0.07>0.03 this is most  likely related to sepsis  Essential hypertension - Continue metoprolol and Cozaar tomorrow as tolerated by blood pressure   Hyperlipidemia - Continue pravastatin  Depression - Continue Celexa  GERD  - PPI  Hyponatremia - Sodium 131 on admission, improved with IV fluids  Insomnia  - Ambien   Code Status : Full  Family Communication  : Daughter at bedside  Disposition Plan  :Home when stable  Consults  :  None  Procedures  : None  DVT Prophylaxis  :  Lovenox   Lab Results  Component Value Date   PLT 198 08/09/2015    Antibiotics  :    Anti-infectives    Start     Dose/Rate Route Frequency Ordered Stop   08/08/15 1700  azithromycin (ZITHROMAX) 500 mg in dextrose 5 % 250 mL IVPB     500 mg 250 mL/hr over 60 Minutes Intravenous Every 24 hours 08/07/15 2134 08/14/15 1659   08/08/15 1600  cefTRIAXone (ROCEPHIN) 1 g in dextrose 5 % 50 mL IVPB     1 g 100 mL/hr over 30 Minutes Intravenous Every 24 hours 08/07/15 2134 08/14/15 1559   08/07/15 1723  azithromycin (ZITHROMAX) 500 MG injection    Comments:  Hartley, Amy   : cabinet override      08/07/15 1723 08/08/15 0529   08/07/15 1545  cefTRIAXone (ROCEPHIN) 1 g in dextrose 5 % 50 mL IVPB     1 g 100 mL/hr over 30 Minutes Intravenous  Once 08/07/15 1538 08/07/15 1642   08/07/15 1545  azithromycin (ZITHROMAX) 500 mg in dextrose 5 % 250 mL IVPB     500 mg 250 mL/hr over 60 Minutes Intravenous  Once 08/07/15 1538 08/07/15 1827        Objective:   Filed Vitals:   08/08/15 1315 08/08/15 2027 08/09/15 0554 08/09/15 1407  BP: 111/49 132/52 124/62 134/64  Pulse: 92 96 78 92  Temp: 98 F (36.7 C) 98.3 F (36.8 C) 98.3 F (36.8 C) 98.2 F (36.8 C)  TempSrc: Oral Oral Oral Oral  Resp: 20 20 17 16   Height:      Weight:      SpO2: 93% 95% 94% 94%    Wt Readings from Last 3 Encounters:  08/07/15 94.1 kg (207 lb 7.3 oz)  08/07/15 90.719 kg (200 lb)  07/15/15 95.766 kg (211 lb 2 oz)     Intake/Output  Summary (Last 24 hours) at 08/09/15 1455 Last data filed at 08/09/15 0946  Gross per 24 hour  Intake   1320 ml  Output    850 ml  Net    470 ml     Physical Exam  Awake Alert, Oriented X 3,  Supple Neck,No JVD,  Symmetrical Chest wall movement, Diminished air movement bilaterally, Scattered Wheezing RRR,No Gallops,Rubs or new Murmurs, No Parasternal Heave +ve B.Sounds, Abd Soft, No tenderness, No rebound - guarding or rigidity. No Cyanosis, Clubbing or edema, No new Rash or bruise      Data Review:    CBC  Recent Labs Lab 08/07/15 1525 08/08/15 0524 08/09/15 0516  WBC 22.0* 19.3* 11.3*  HGB 13.7 12.8 11.9*  HCT 40.8 39.2 37.2  PLT 179 183 198  MCV 92.1 90.5 91.4  MCH 30.9 29.6 29.2  MCHC 33.6 32.7 32.0  RDW 12.7 12.8 12.9  LYMPHSABS 0.9 0.7  --   MONOABS 1.2* 0.5  --   EOSABS 0.0 0.0  --   BASOSABS 0.0 0.0  --     Chemistries   Recent Labs Lab 08/07/15 1525 08/08/15 0115 08/09/15 0516  NA 131* 133* 138  K 2.9* 3.8 3.7  CL 95* 99* 106  CO2 23 24 23   GLUCOSE 164* 144* 121*  BUN 14 19 16   CREATININE 1.12* 1.18* 0.64  CALCIUM 8.2* 8.4* 8.9  AST 28  --   --   ALT 13*  --   --   ALKPHOS  40  --   --   BILITOT 1.2  --   --    ------------------------------------------------------------------------------------------------------------------ No results for input(s): CHOL, HDL, LDLCALC, TRIG, CHOLHDL, LDLDIRECT in the last 72 hours.  Lab Results  Component Value Date   HGBA1C 6.5 07/04/2015   ------------------------------------------------------------------------------------------------------------------ No results for input(s): TSH, T4TOTAL, T3FREE, THYROIDAB in the last 72 hours.  Invalid input(s): FREET3 ------------------------------------------------------------------------------------------------------------------ No results for input(s): VITAMINB12, FOLATE, FERRITIN, TIBC, IRON, RETICCTPCT in the last 72 hours.  Coagulation profile No results  for input(s): INR, PROTIME in the last 168 hours.  No results for input(s): DDIMER in the last 72 hours.  Cardiac Enzymes  Recent Labs Lab 08/07/15 2203 08/08/15 0115 08/08/15 0524  TROPONINI 0.04* 0.07* 0.03   ------------------------------------------------------------------------------------------------------------------ No results found for: BNP  Inpatient Medications  Scheduled Meds: . arformoterol  15 mcg Nebulization BID  . azithromycin  500 mg Intravenous Q24H  . budesonide (PULMICORT) nebulizer solution  0.5 mg Nebulization BID  . cefTRIAXone (ROCEPHIN)  IV  1 g Intravenous Q24H  . citalopram  40 mg Oral Daily  . enoxaparin (LOVENOX) injection  40 mg Subcutaneous Q24H  . feeding supplement  1 Container Oral Q24H  . guaiFENesin  600 mg Oral BID  . insulin aspart  0-9 Units Subcutaneous TID WC  . insulin glargine  5 Units Subcutaneous Daily  . losartan  50 mg Oral Daily  . methylPREDNISolone (SOLU-MEDROL) injection  60 mg Intravenous Q8H  . metoprolol succinate  25 mg Oral Daily  . pantoprazole  40 mg Oral Daily  . pravastatin  10 mg Oral q1800   Continuous Infusions: . sodium chloride 75 mL/hr at 08/09/15 0411   PRN Meds:.acetaminophen, benzonatate, ipratropium-albuterol, prochlorperazine, zolpidem  Micro Results Recent Results (from the past 240 hour(s))  Blood culture (routine x 2)     Status: None (Preliminary result)   Collection Time: 08/07/15  3:25 PM  Result Value Ref Range Status   Specimen Description BLOOD LEFT ANTECUBITAL  Final   Special Requests BOTTLES DRAWN AEROBIC AND ANAEROBIC 10CC  Final   Culture   Final    NO GROWTH 2 DAYS Performed at [facility]    Report Status PENDING  Incomplete  Blood culture (routine x 2)     Status: None (Preliminary result)   Collection Time: 08/07/15  4:05 PM  Result Value Ref Range Status   Specimen Description BLOOD LEFT WRIST  Final   Special Requests BOTTLES DRAWN AEROBIC AND ANAEROBIC  5ML   Final   Culture   Final    NO GROWTH 2 DAYS Performed at [facility]    Report Status PENDING  Incomplete    Radiology Reports Dg Chest 2 View  08/07/2015  CLINICAL DATA:  Fever, chills, sweats EXAM: CHEST  2 VIEW COMPARISON:  01/18/2013 FINDINGS: There is left lower lobe airspace disease. There is no pleural effusion or pneumothorax. The heart and mediastinal contours are unremarkable. The osseous structures are unremarkable. IMPRESSION: Left lower lobe pneumonia. Followup PA and lateral chest X-ray is recommended in 3-4 weeks following trial of antibiotic therapy to ensure resolution and exclude underlying malignancy. Electronically Signed   By: Kathreen Devoid   On: 08/07/2015 14:31    Time Spent in minutes  25 minutes   Shian Goodnow M.D on 08/09/2015 at 2:55 PM  Between 7am to 7pm - Pager - [phone]  After 7pm go to [email or web address] - password [facility]  [facility] -  Office  [phone]

## 2015-08-09 NOTE — Care Management Important Message (Signed)
Important Message  Patient Details  Name: Savannah Stanley MRN: [id number] Date of Birth: 06-14-1950   Medicare Important Message Given:  Yes    Nathen May 08/09/2015, 1:26 PM

## 2015-08-09 NOTE — Progress Notes (Signed)
Pharmacy Antibiotic Note  Savannah Stanley is a 65 y.o. female admitted on 08/07/2015 with pneumonia.  Pharmacy has been consulted for rocephin/azith dosing. Being cover for PNA with azith/roc. These will not require renal adjustments.   Plan:  Cont standard dose roc/azith Rx will sign off  Height: 5\' 3"  (160 cm) Weight: 207 lb 7.3 oz (94.1 kg) IBW/kg (Calculated) : 52.4  Temp (24hrs), Avg:98.2 F (36.8 C), Min:98 F (36.7 C), Max:98.3 F (36.8 C)   Recent Labs Lab 08/07/15 1525 08/07/15 2204 08/08/15 0115 08/08/15 0524 08/09/15 0516  WBC 22.0*  --   --  19.3* 11.3*  CREATININE 1.12*  --  1.18*  --  0.64  LATICACIDVEN  --  1.8  --   --   --     Estimated Creatinine Clearance: 75.5 mL/min (by C-G formula based on Cr of 0.64).    Allergies  Allergen Reactions  . Bee Venom Shortness Of Breath  . Other Shortness Of Breath    MSG.  . Latex Itching    Antimicrobials this admission: 5/25 rocephin >> 5/31 5/25 azithromycin >> 5/31  Dose adjustments this admission:  Microbiology results: 5/24 BCx:   Onnie Boer, PharmD Pager: [phone] 08/09/2015 8:20 AM

## 2015-08-10 LAB — GLUCOSE, CAPILLARY
Glucose-Capillary: 223 mg/dL — ABNORMAL HIGH (ref 65–99)
Glucose-Capillary: 142 mg/dL — ABNORMAL HIGH (ref 65–99)

## 2015-08-10 MED ORDER — AMOXICILLIN-POT CLAVULANATE 875-125 MG PO TABS: 1.0000 | ORAL_TABLET | Freq: Two times a day (BID) | ORAL | Status: DC

## 2015-08-10 MED ORDER — PREDNISONE 10 MG PO TABS: ORAL_TABLET | ORAL | Status: DC

## 2015-08-10 MED ORDER — GUAIFENESIN ER 600 MG PO TB12: 600.0000 mg | ORAL_TABLET | Freq: Two times a day (BID) | ORAL | Status: DC

## 2015-08-10 MED ORDER — METHYLPREDNISOLONE SODIUM SUCC 40 MG IJ SOLR: 40.0000 mg | Freq: Two times a day (BID) | INTRAMUSCULAR | Status: DC

## 2015-08-10 MED ORDER — SENNOSIDES-DOCUSATE SODIUM 8.6-50 MG PO TABS
2.0000 | ORAL_TABLET | Freq: Two times a day (BID) | ORAL | Status: DC
  Administered 2015-08-10: 2 via ORAL
  Filled 2015-08-10: qty 2

## 2015-08-10 MED ORDER — POLYETHYLENE GLYCOL 3350 17 G PO PACK: 17.0000 g | PACK | Freq: Every day | ORAL | Status: DC | PRN

## 2015-08-10 NOTE — Discharge Summary (Signed)
Savannah Stanley, is a 65 y.o. female  DOB Oct 11, 1950  MRN [id number].  Admission date:  08/07/2015  Admitting Physician  Waldemar Dickens, MD  Discharge Date:  08/10/2015   Primary MD  Penni Homans, MD  Recommendations for primary care physician for things to follow:  - Please check CBC, BMP during next visit - Patient will need repeat 2 view chest x-ray in 3-4 weeks to ensure resolution of pneumonia   Admission Diagnosis  CAP (community acquired pneumonia) [J18.9]   Discharge Diagnosis  CAP (community acquired pneumonia) [J18.9]    Principal Problem:   Sepsis ([place]) Active Problems:   COPD (chronic obstructive pulmonary disease) ([place])   Depression   Insomnia   Hypertension   Hyperlipidemia   Hypokalemia   CAP (community acquired pneumonia)   Elevated troponin   Nonspecific abnormal electrocardiogram (ECG) (EKG)      Past Medical History  Diagnosis Date  . Hypertension   . GERD (gastroesophageal reflux disease)   . H/O hiatal hernia   . Iron deficiency anemia   . Depression   . COPD (chronic obstructive pulmonary disease) ([place])   . Murmur, heart 05/20/2014  . High cholesterol   . Pneumonia 07/2010; 08/07/2015  . Bladder tumor "multiple"    "some ORs; some removed in office"  . Type 2 diabetes mellitus ([place])   . Diabetes mellitus type 2 in obese ([place]) 01/31/2015  . History of blood transfusion 01/2013 - 02/2013    related to ORs  . Bladder cancer ([place]) UROLOGIST-- DR Diona Fanti    HIGH-GRADE UROTHELIAL CARCINOMA    Past Surgical History  Procedure Laterality Date  . Transurethral resection of bladder tumor with gyrus (turbt-gyrus) N/A 01/23/2013    Procedure: TRANSURETHRAL RESECTION OF BLADDER TUMOR WITH GYRUS ;  Surgeon: Franchot Gallo, MD;  Location: [facility];  Service: Urology;  Laterality: N/A;  . Appendectomy  1980's  . Tubal ligation  1980's  . Transurethral resection of  bladder tumor N/A 02/16/2013    Procedure: TRANSURETHRAL RESECTION OF BLADDER TUMOR (TURBT);  Surgeon: Franchot Gallo, MD;  Location: [facility];  Service: Urology;  Laterality: N/A;  . Transurethral resection of bladder tumor N/A 12/18/2013    Procedure: TRANSURETHRAL RESECTION OF BLADDER TUMOR (TURBT) ;  Surgeon: Jorja Loa, MD;  Location: [facility];  Service: Urology;  Laterality: N/A;  . Cystoscopy N/A 12/18/2013    Procedure: CYSTOSCOPY;  Surgeon: Jorja Loa, MD;  Location: [facility];  Service: Urology;  Laterality: N/A;       History of present illness and  Hospital Course:     Kindly see H&P for history of present illness and admission details, please review complete Labs, Consult reports and Test reports for all details in brief  HPI  from the history and physical done on the day of admission 08/07/2015 HPI: Savannah Stanley is a 65 y.o. female with medical history significant of HTN, DM type II, COPD, HLD, gerd, and bladder cancer s/p resection; who  presents with complaints of generalized weakness with cough and congestion. Symptoms started approximately 1 week ago has just a "cold". She reports having loose's sputum production with the cough that she usually just swallowed. She reports utilizing Mucinex and Delsym with some improvement of symptoms first. However, symptoms persisted and the cough changed about 2 days ago(5/22) and became nonproductive. She's had some left lower rib pain associated with coughing. Associated symptoms at this time included chills, intermittent wheezing, shortness of breath with exertion, fatigue, decreased energy, nausea, dry heaves, no appetite, weight loss of 10 pounds over the last week, and headache. She denies having any fever, vomiting, rash, changes in vision, focal weakness, syncope, diarrhea, dysuria, or urinary frequency symptoms. She is up-to-date on her pneumonia and flu vaccines.  She reports being around 3 grandchildren that are constantly a sick with something.   ED Course: Upon admission to the emergency department patient was evaluated and seen to have a temperature of 79F, pulse rate of up to 104, respirations up to 22, blood pressure as low as 91/52. Initial lab work revealed WBC 22, hemoglobin 13.7, platelets 179, sodium 131, potassium 2.9, chloride 95, BUN 14, creatinine 1.12 and initial troponin 0.04. Chest x-ray revealed left lower lobe infiltrate. Patient was given 1 L of IV fluids and started on azithromycin and ceftriaxone. Patient was transferred from [facility] to a telemetry bed with TRH to admit.  Review of Systems: As per HPI otherwise   Hospital Course   65 y.o. female with medical history significant of HTN, DM type II, COPD, HLD, gerd, and bladder cancer s/p resection; who presents with complaints of generalized weakness with cough and congestion, admitted for COPD exacerbation and pneumonia  CAP - Patient presents with cough, productive sputum, chest x-ray with left lower lobe infiltrate - Treated with IV Rocephin and azithromycin during hospital stay, finished total of 4 days, to finish another 3 days of by mouth Augmentin as an outpatient. - Follow blood cultures, no growth at time of discharge - Will need follow-up 2 view chest x-ray to ensure resolution of pneumonia and no malignancy  COPD exacerbation -Treated with IV steroids during hospital stays, wheezing significantly improved, we'll discharge on prednisone taper, continue with Symbicort on discharge   Diabetes mellitus - 0 metformin on discharge   Hypokalemia - Repleted,   Borderline troponins - Non-ACS pattern,0.04>0.04>0.07>0.03 this is most likely related to sepsis  Essential hypertension - Continue metoprolol and Cozaar tomorrow as tolerated by blood pressure   Hyperlipidemia - Continue pravastatin  Depression - Continue Celexa  GERD  -  PPI  Hyponatremia - Sodium 131 on admission, improved with IV fluids - Resolved    Discharge Condition: Stable   Follow UP  Follow-up Information    Schedule an appointment as soon as possible for a visit with Penni Homans, MD.   Specialty:  Family Medicine   Contact information:   [address] [place] [address] [phone]         Discharge Instructions  and  Discharge Medications    Discharge Instructions    Discharge instructions    Complete by:  As directed   Follow with Primary MD Penni Homans, MD in 7 days   Get CBC, CMP, 2 view Chest X ray checked  by Primary MD next visit.    Activity: As tolerated with Full fall precautions use walker/cane & assistance as needed   Disposition Home    Diet: Heart Healthy, carbohydrate  modified , with feeding assistance and aspiration precautions.  For Heart failure patients - Check your Weight same time everyday, if you gain over 2 pounds, or you develop in leg swelling, experience more shortness of breath or chest pain, call your Primary MD immediately. Follow Cardiac Low Salt Diet and 1.5 lit/day fluid restriction.   On your next visit with your primary care physician please Get Medicines reviewed and adjusted.   Please request your Prim.MD to go over all Hospital Tests and Procedure/Radiological results at the follow up, please get all Hospital records sent to your Prim MD by signing hospital release before you go home.   If you experience worsening of your admission symptoms, develop shortness of breath, life threatening emergency, suicidal or homicidal thoughts you must seek medical attention immediately by calling 911 or calling your MD immediately  if symptoms less severe.  You Must read complete instructions/literature along with all the possible adverse reactions/side effects for all the Medicines you take and that have been prescribed to you. Take any new Medicines after you have  completely understood and accpet all the possible adverse reactions/side effects.   Do not drive, operating heavy machinery, perform activities at heights, swimming or participation in water activities or provide baby sitting services if your were admitted for syncope or siezures until you have seen by Primary MD or a Neurologist and advised to do so again.  Do not drive when taking Pain medications.    Do not take more than prescribed Pain, Sleep and Anxiety Medications  Special Instructions: If you have smoked or chewed Tobacco  in the last 2 yrs please stop smoking, stop any regular Alcohol  and or any Recreational drug use.  Wear Seat belts while driving.   Please note  You were cared for by a hospitalist during your hospital stay. If you have any questions about your discharge medications or the care you received while you were in the hospital after you are discharged, you can call the unit and asked to speak with the hospitalist on call if the hospitalist that took care of you is not available. Once you are discharged, your primary care physician will handle any further medical issues. Please note that NO REFILLS for any discharge medications will be authorized once you are discharged, as it is imperative that you return to your primary care physician (or establish a relationship with a primary care physician if you do not have one) for your aftercare needs so that they can reassess your need for medications and monitor your lab values.            Medication List    TAKE these medications        albuterol 108 (90 Base) MCG/ACT inhaler  Commonly known as:  PROVENTIL HFA;VENTOLIN HFA  Inhale 2 puffs into the lungs every 6 (six) hours as needed for wheezing or shortness of breath. Please dispense ProAir     amoxicillin-clavulanate 875-125 MG tablet  Commonly known as:  AUGMENTIN  Take 1 tablet by mouth 2 (two) times daily. Please take for 3 days     citalopram 40 MG tablet   Commonly known as:  CELEXA  Take 1 tablet (40 mg total) by mouth daily.     CITRACAL + D PO  Take 2 tablets by mouth 2 (two) times daily.     CoQ10 200 MG Caps  Take 1 capsule by mouth daily.     EPINEPHrine 0.3 mg/0.3 mL Soaj injection  Commonly  known as:  EPI-PEN  Inject 0.3 mLs (0.3 mg total) into the muscle once.     FEOSOL 325 (65 FE) MG tablet  Generic drug:  ferrous sulfate  Take 325 mg by mouth daily with breakfast.     guaiFENesin 600 MG 12 hr tablet  Commonly known as:  MUCINEX  Take 1 tablet (600 mg total) by mouth 2 (two) times daily.     losartan 50 MG tablet  Commonly known as:  COZAAR  Take 1 tablet by mouth  daily     metFORMIN 500 MG tablet  Commonly known as:  GLUCOPHAGE  TAKE ONE TABLET BY MOUTH TWICE DAILY WITH MEALS     metoprolol succinate 25 MG 24 hr tablet  Commonly known as:  TOPROL-XL  TAKE 1 TABLET (25 MG TOTAL) BY MOUTH DAILY.     omeprazole 20 MG capsule  Commonly known as:  PRILOSEC  Take 1 capsule (20 mg total) by mouth 2 (two) times daily as needed.     pravastatin 10 MG tablet  Commonly known as:  PRAVACHOL  Take 1 tablet by mouth  daily     predniSONE 10 MG tablet  Commonly known as:  DELTASONE  Please take 40 mg daily for 3 days, then 30 mg for 3 days, then 20 mg for 2 days, then 10 mg for 2 days, then stop.     SYMBICORT 160-4.5 MCG/ACT inhaler  Generic drug:  budesonide-formoterol  Use 2 puffs two times daily     ZINC PO  Take 1 tablet by mouth daily.          Diet and Activity recommendation: See Discharge Instructions above   Consults obtained - None   Major procedures and Radiology Reports - PLEASE review detailed and final reports for all details, in brief -   None   Dg Chest 2 View  08/07/2015  CLINICAL DATA:  Fever, chills, sweats EXAM: CHEST  2 VIEW COMPARISON:  01/18/2013 FINDINGS: There is left lower lobe airspace disease. There is no pleural effusion or pneumothorax. The heart and mediastinal  contours are unremarkable. The osseous structures are unremarkable. IMPRESSION: Left lower lobe pneumonia. Followup PA and lateral chest X-ray is recommended in 3-4 weeks following trial of antibiotic therapy to ensure resolution and exclude underlying malignancy. Electronically Signed   By: Kathreen Devoid   On: 08/07/2015 14:31    Micro Results    Recent Results (from the past 240 hour(s))  Blood culture (routine x 2)     Status: None (Preliminary result)   Collection Time: 08/07/15  3:25 PM  Result Value Ref Range Status   Specimen Description BLOOD LEFT ANTECUBITAL  Final   Special Requests BOTTLES DRAWN AEROBIC AND ANAEROBIC 10CC  Final   Culture   Final    NO GROWTH 3 DAYS Performed at [facility]    Report Status PENDING  Incomplete  Blood culture (routine x 2)     Status: None (Preliminary result)   Collection Time: 08/07/15  4:05 PM  Result Value Ref Range Status   Specimen Description BLOOD LEFT WRIST  Final   Special Requests BOTTLES DRAWN AEROBIC AND ANAEROBIC  5ML  Final   Culture   Final    NO GROWTH 3 DAYS Performed at [facility]    Report Status PENDING  Incomplete       Today   Subjective:   Savannah Stanley today has no headache,no chest abdominal pain,no new weakness tingling or numbness, feels  much better wants to go home today, Cough significantly improved. Minimally productive.  Objective:   Blood pressure 130/51, pulse 84, temperature 97.7 F (36.5 C), temperature source Oral, resp. rate 19, height 5\' 3"  (1.6 m), weight 94.1 kg (207 lb 7.3 oz), SpO2 94 %.   Intake/Output Summary (Last 24 hours) at 08/10/15 1404 Last data filed at 08/10/15 1338  Gross per 24 hour  Intake   1520 ml  Output    200 ml  Net   1320 ml    Exam Awake Alert, Oriented x 3, No new F. Supple Neck,No JVD, No cervical lymphadenopathy appriciated.  Symmetrical Chest wall movement, Good air movement bilaterally, no further wheezing RRR,No Gallops,Rubs or new  Murmurs, No Parasternal Heave +ve B.Sounds, Abd Soft, Non tender, No organomegaly appriciated, No rebound -guarding or rigidity. No Cyanosis, Clubbing or edema, No new Rash or bruise  Data Review   CBC w Diff: Lab Results  Component Value Date   WBC 11.3* 08/09/2015   HGB 11.9* 08/09/2015   HCT 37.2 08/09/2015   PLT 198 08/09/2015   LYMPHOPCT 4 08/08/2015   MONOPCT 2 08/08/2015   EOSPCT 0 08/08/2015   BASOPCT 0 08/08/2015    CMP: Lab Results  Component Value Date   NA 138 08/09/2015   K 3.7 08/09/2015   CL 106 08/09/2015   CO2 23 08/09/2015   BUN 16 08/09/2015   CREATININE 0.64 08/09/2015   CREATININE 0.66 11/09/2013   PROT 6.6 08/07/2015   ALBUMIN 3.4* 08/07/2015   BILITOT 1.2 08/07/2015   ALKPHOS 40 08/07/2015   AST 28 08/07/2015   ALT 13* 08/07/2015  .   Total Time in preparing paper work, data evaluation and todays exam - 35 minutes  Jayel Scaduto M.D on 08/10/2015 at 2:04 PM  [facility]   Office  [phone]

## 2015-08-10 NOTE — Discharge Instructions (Signed)
Follow with Primary MD Penni Homans, MD in 7 days   Get CBC, CMP, 2 view Chest X ray checked  by Primary MD next visit.    Activity: As tolerated with Full fall precautions use walker/cane & assistance as needed   Disposition Home    Diet: Heart Healthy, carbohydrate modified , with feeding assistance and aspiration precautions.  For Heart failure patients - Check your Weight same time everyday, if you gain over 2 pounds, or you develop in leg swelling, experience more shortness of breath or chest pain, call your Primary MD immediately. Follow Cardiac Low Salt Diet and 1.5 lit/day fluid restriction.   On your next visit with your primary care physician please Get Medicines reviewed and adjusted.   Please request your Prim.MD to go over all Hospital Tests and Procedure/Radiological results at the follow up, please get all Hospital records sent to your Prim MD by signing hospital release before you go home.   If you experience worsening of your admission symptoms, develop shortness of breath, life threatening emergency, suicidal or homicidal thoughts you must seek medical attention immediately by calling 911 or calling your MD immediately  if symptoms less severe.  You Must read complete instructions/literature along with all the possible adverse reactions/side effects for all the Medicines you take and that have been prescribed to you. Take any new Medicines after you have completely understood and accpet all the possible adverse reactions/side effects.   Do not drive, operating heavy machinery, perform activities at heights, swimming or participation in water activities or provide baby sitting services if your were admitted for syncope or siezures until you have seen by Primary MD or a Neurologist and advised to do so again.  Do not drive when taking Pain medications.    Do not take more than prescribed Pain, Sleep and Anxiety Medications  Special Instructions: If you have smoked or  chewed Tobacco  in the last 2 yrs please stop smoking, stop any regular Alcohol  and or any Recreational drug use.  Wear Seat belts while driving.   Please note  You were cared for by a hospitalist during your hospital stay. If you have any questions about your discharge medications or the care you received while you were in the hospital after you are discharged, you can call the unit and asked to speak with the hospitalist on call if the hospitalist that took care of you is not available. Once you are discharged, your primary care physician will handle any further medical issues. Please note that NO REFILLS for any discharge medications will be authorized once you are discharged, as it is imperative that you return to your primary care physician (or establish a relationship with a primary care physician if you do not have one) for your aftercare needs so that they can reassess your need for medications and monitor your lab values.

## 2015-08-10 NOTE — Progress Notes (Signed)
After 2 attempts with a continuous pulse oximeter, patient does not want it on.  She has thick pink polish on and the sensor just will not pick it up.  Delcie Roch, RN

## 2015-08-10 NOTE — Progress Notes (Signed)
Nsg Discharge Note  Admit Date:  08/07/2015 Discharge date: 08/10/2015   Blane Ohara Littles to be D/C'd Home per MD order.  AVS completed.  Copy for chart, and copy for patient signed, and dated. Patient/caregiver able to verbalize understanding.  Discharge Medication:   Medication List    TAKE these medications        albuterol 108 (90 Base) MCG/ACT inhaler  Commonly known as:  PROVENTIL HFA;VENTOLIN HFA  Inhale 2 puffs into the lungs every 6 (six) hours as needed for wheezing or shortness of breath. Please dispense ProAir     amoxicillin-clavulanate 875-125 MG tablet  Commonly known as:  AUGMENTIN  Take 1 tablet by mouth 2 (two) times daily. Please take for 3 days     citalopram 40 MG tablet  Commonly known as:  CELEXA  Take 1 tablet (40 mg total) by mouth daily.     CITRACAL + D PO  Take 2 tablets by mouth 2 (two) times daily.     CoQ10 200 MG Caps  Take 1 capsule by mouth daily.     EPINEPHrine 0.3 mg/0.3 mL Soaj injection  Commonly known as:  EPI-PEN  Inject 0.3 mLs (0.3 mg total) into the muscle once.     FEOSOL 325 (65 FE) MG tablet  Generic drug:  ferrous sulfate  Take 325 mg by mouth daily with breakfast.     guaiFENesin 600 MG 12 hr tablet  Commonly known as:  MUCINEX  Take 1 tablet (600 mg total) by mouth 2 (two) times daily.     losartan 50 MG tablet  Commonly known as:  COZAAR  Take 1 tablet by mouth  daily     metFORMIN 500 MG tablet  Commonly known as:  GLUCOPHAGE  TAKE ONE TABLET BY MOUTH TWICE DAILY WITH MEALS     metoprolol succinate 25 MG 24 hr tablet  Commonly known as:  TOPROL-XL  TAKE 1 TABLET (25 MG TOTAL) BY MOUTH DAILY.     omeprazole 20 MG capsule  Commonly known as:  PRILOSEC  Take 1 capsule (20 mg total) by mouth 2 (two) times daily as needed.     pravastatin 10 MG tablet  Commonly known as:  PRAVACHOL  Take 1 tablet by mouth  daily     predniSONE 10 MG tablet  Commonly known as:  DELTASONE  Please take 40 mg daily for 3 days,  then 30 mg for 3 days, then 20 mg for 2 days, then 10 mg for 2 days, then stop.     SYMBICORT 160-4.5 MCG/ACT inhaler  Generic drug:  budesonide-formoterol  Use 2 puffs two times daily     ZINC PO  Take 1 tablet by mouth daily.        Discharge Assessment: Filed Vitals:   08/10/15 0545 08/10/15 1335  BP: 153/74 130/51  Pulse: 86 84  Temp: 97.8 F (36.6 C) 97.7 F (36.5 C)  Resp: 18 19   Skin clean, dry and intact without evidence of skin break down, no evidence of skin tears noted. IV catheter discontinued intact. Site without signs and symptoms of complications - no redness or edema noted at insertion site, patient denies c/o pain - only slight tenderness at site.  Dressing with slight pressure applied.  D/c Instructions-Education: Discharge instructions given to patient/family with verbalized understanding. D/c education completed with patient/family including follow up instructions, medication list, d/c activities limitations if indicated, with other d/c instructions as indicated by MD - patient able to verbalize  understanding, all questions fully answered. Patient instructed to return to ED, call 911, or call MD for any changes in condition.  Patient escorted via [place], and D/C home via private auto.  Salley Slaughter, RN 08/10/2015 3:38 PM

## 2015-08-12 LAB — CULTURE, BLOOD (ROUTINE X 2)
CULTURE: NO GROWTH
CULTURE: NO GROWTH

## 2015-08-14 ENCOUNTER — Telehealth: Payer: Self-pay | Admitting: Behavioral Health

## 2015-08-14 NOTE — Telephone Encounter (Signed)
Transition Care Management Follow-up Telephone Call  Admission date: 08/07/2015 Admitting Physician Waldemar Dickens, MD  Discharge Date: 08/10/2015   Primary MD Penni Homans, MD  Recommendations for primary care physician for things to follow:  - Please check CBC, BMP during next visit - Patient will need repeat 2 view chest x-ray in 3-4 weeks to ensure resolution of pneumonia   Admission Diagnosis CAP (community acquired pneumonia) [J18.9]   Discharge Diagnosis CAP (community acquired pneumonia) [J18.9]   Principal Problem:  Sepsis ([place]) Active Problems:  COPD (chronic obstructive pulmonary disease) ([place])  Depression  Insomnia  Hypertension  Hyperlipidemia  Hypokalemia  CAP (community acquired pneumonia)  Elevated troponin  Nonspecific abnormal electrocardiogram (ECG) (EKG)   Discharge Condition: Stable   How have you been since you were released from the hospital? Patient stated, "I've been fine, however I do have some wheezing and congestion in my head". "I am improving slowly".   Do you understand why you were in the hospital? yes   Do you understand the discharge instructions? yes   Where were you discharged to? Home   Items Reviewed:  Medications reviewed: yes  Allergies reviewed: yes  Dietary changes reviewed: no changes were made to diet.  Referrals reviewed: yes, follow-up with PCP in 1 week.   Functional Questionnaire:   Activities of Daily Living (ADLs):   She states they are independent in the following: ambulation, bathing and hygiene, feeding, continence, grooming, toileting and dressing States they require assistance with the following: None   Any transportation issues/concerns?: no   Any patient concerns? no   Confirmed importance and date/time of follow-up visits scheduled yes, 08/16/15 at 1:00 PM.  Provider Appointment booked with Mackie Pai, PA-C.  Confirmed with patient if condition begins to worsen  call PCP or go to the ER.  Patient was given the office number and encouraged to call back with question or concerns.  : yes

## 2015-08-16 ENCOUNTER — Encounter: Payer: Self-pay | Admitting: Medical

## 2015-08-16 ENCOUNTER — Ambulatory Visit (INDEPENDENT_AMBULATORY_CARE_PROVIDER_SITE_OTHER): Payer: Medicare Other | Admitting: Medical

## 2015-08-16 VITALS — BP 116/78 | HR 78 | Temp 98.1°F | Ht 63.0 in | Wt 202.0 lb

## 2015-08-16 DIAGNOSIS — R062 Wheezing: Secondary | ICD-10-CM | POA: Diagnosis not present

## 2015-08-16 DIAGNOSIS — D72829 Elevated white blood cell count, unspecified: Secondary | ICD-10-CM | POA: Diagnosis not present

## 2015-08-16 DIAGNOSIS — J189 Pneumonia, unspecified organism: Secondary | ICD-10-CM | POA: Diagnosis not present

## 2015-08-16 MED ORDER — PREDNISONE 10 MG PO TABS: ORAL_TABLET | ORAL | Status: DC

## 2015-08-16 NOTE — Patient Instructions (Addendum)
Your are clinically much better now after hospitalized and treatment.  For wheezing continue the prednisone taper. I am making shorter taper dose of prednisone  to use again over next week if you flare again. Continue your inhalers.  For elevated white count and pneumonia I will put in cbc and cxr to be done in 7-10 days. Currently wbc may be falsely elevated due to recent prednisone use.  For recent nasal congestion restart steroid nasal spray  Follow up in 7-10 days or as needed

## 2015-08-16 NOTE — Progress Notes (Signed)
Subjective:    Patient ID: Savannah Stanley, female    DOB: Aug 21, 1950, 65 y.o.   MRN: [id number]  HPI   Pt in for follow up. She was admitted to hospital for pneumonia and some possible sepis per report.  Pt left hospital on Saturday. Finished antibiotic on Monday or Tuesday. Pt on prednisone and in next 2-3 days will finish that.  Since discharge no fever or chills.   Some mild nasal congested. No sinus pain. Clear drainage. Occasional mild  Wheeze.  Pt is on symbicort. Uses albuterol if needed.  Pt had low na on admission but on lat check was good.  Pt wbc was elevated on admission.    Review of Systems  Constitutional: Negative for fever, chills and fatigue.  HENT: Negative for dental problem, postnasal drip and rhinorrhea.   Respiratory: Positive for wheezing. Negative for cough and chest tightness.        Rare transient wheeze.  Cardiovascular: Negative for chest pain and palpitations.  Gastrointestinal: Negative for nausea, abdominal pain, constipation and blood in stool.  Musculoskeletal: Negative for back pain.  Neurological: Negative for dizziness and light-headedness.  Hematological: Negative for adenopathy. Does not bruise/bleed easily.    Past Medical History  Diagnosis Date  . Hypertension   . GERD (gastroesophageal reflux disease)   . H/O hiatal hernia   . Iron deficiency anemia   . Depression   . COPD (chronic obstructive pulmonary disease) ([place])   . Murmur, heart 05/20/2014  . High cholesterol   . Pneumonia 07/2010; 08/07/2015  . Bladder tumor "multiple"    "some ORs; some removed in office"  . Type 2 diabetes mellitus ([place])   . Diabetes mellitus type 2 in obese ([place]) 01/31/2015  . History of blood transfusion 01/2013 - 02/2013    related to ORs  . Bladder cancer ([place]) UROLOGIST-- DR Diona Fanti    HIGH-GRADE UROTHELIAL CARCINOMA     Social History   Social History  . Marital Status: Widowed    Spouse Name: N/A  . Number of Children: N/A  .  Years of Education: N/A   Occupational History  . Not on file.   Social History Main Topics  . Smoking status: Former Smoker -- 1.00 packs/day for 35 years    Types: Cigarettes    Quit date: 07/15/2010  . Smokeless tobacco: Never Used  . Alcohol Use: No  . Drug Use: No  . Sexual Activity: Not on file   Other Topics Concern  . Not on file   Social History Narrative    Past Surgical History  Procedure Laterality Date  . Transurethral resection of bladder tumor with gyrus (turbt-gyrus) N/A 01/23/2013    Procedure: TRANSURETHRAL RESECTION OF BLADDER TUMOR WITH GYRUS ;  Surgeon: Franchot Gallo, MD;  Location: [facility];  Service: Urology;  Laterality: N/A;  . Appendectomy  1980's  . Tubal ligation  1980's  . Transurethral resection of bladder tumor N/A 02/16/2013    Procedure: TRANSURETHRAL RESECTION OF BLADDER TUMOR (TURBT);  Surgeon: Franchot Gallo, MD;  Location: [facility];  Service: Urology;  Laterality: N/A;  . Transurethral resection of bladder tumor N/A 12/18/2013    Procedure: TRANSURETHRAL RESECTION OF BLADDER TUMOR (TURBT) ;  Surgeon: Jorja Loa, MD;  Location: [facility];  Service: Urology;  Laterality: N/A;  . Cystoscopy N/A 12/18/2013    Procedure: CYSTOSCOPY;  Surgeon: Jorja Loa, MD;  Location: [facility];  Service: Urology;  Laterality: N/A;  Family History  Problem Relation Age of Onset  . Cancer Father     prostate  . Arthritis Brother     Allergies  Allergen Reactions  . Bee Venom Shortness Of Breath  . Other Shortness Of Breath    MSG.  . Latex Itching    Current Outpatient Prescriptions on File Prior to Visit  Medication Sig Dispense Refill  . albuterol (PROVENTIL HFA;VENTOLIN HFA) 108 (90 BASE) MCG/ACT inhaler Inhale 2 puffs into the lungs every 6 (six) hours as needed for wheezing or shortness of breath. Please dispense ProAir 3 Inhaler 0  . Calcium Citrate-Vitamin D (CITRACAL +  D PO) Take 2 tablets by mouth 2 (two) times daily.     . citalopram (CELEXA) 40 MG tablet Take 1 tablet (40 mg total) by mouth daily. 90 tablet 2  . Coenzyme Q10 (COQ10) 200 MG CAPS Take 1 capsule by mouth daily.    Marland Kitchen EPINEPHrine 0.3 mg/0.3 mL IJ SOAJ injection Inject 0.3 mLs (0.3 mg total) into the muscle once. 1 Device 1  . ferrous sulfate (FEOSOL) 325 (65 FE) MG tablet Take 325 mg by mouth daily with breakfast.     . guaiFENesin (MUCINEX) 600 MG 12 hr tablet Take 1 tablet (600 mg total) by mouth 2 (two) times daily. 20 tablet 0  . losartan (COZAAR) 50 MG tablet Take 1 tablet by mouth  daily 90 tablet 2  . metFORMIN (GLUCOPHAGE) 500 MG tablet TAKE ONE TABLET BY MOUTH TWICE DAILY WITH MEALS 60 tablet 4  . metoprolol succinate (TOPROL-XL) 25 MG 24 hr tablet TAKE 1 TABLET (25 MG TOTAL) BY MOUTH DAILY. 90 tablet 2  . Multiple Vitamins-Minerals (ZINC PO) Take 1 tablet by mouth daily.    Marland Kitchen omeprazole (PRILOSEC) 20 MG capsule Take 1 capsule (20 mg total) by mouth 2 (two) times daily as needed. 180 capsule 2  . pravastatin (PRAVACHOL) 10 MG tablet Take 1 tablet by mouth  daily 90 tablet 2  . predniSONE (DELTASONE) 10 MG tablet Please take 40 mg daily for 3 days, then 30 mg for 3 days, then 20 mg for 2 days, then 10 mg for 2 days, then stop. 27 tablet 0  . SYMBICORT 160-4.5 MCG/ACT inhaler Use 2 puffs two times daily 30.6 g 1   Current Facility-Administered Medications on File Prior to Visit  Medication Dose Route Frequency Provider Last Rate Last Dose  . mitoMYcin (MUTAMYCIN) chemo injection 40 mg  40 mg Bladder Instillation Once Franchot Gallo, MD        BP 116/78 mmHg  Pulse 78  Temp(Src) 98.1 F (36.7 C) (Oral)  Ht 5\' 3"  (1.6 m)  Wt 202 lb (91.627 kg)  BMI 35.79 kg/m2  SpO2 98%       Objective:   Physical Exam   General  Mental Status - Alert. General Appearance - Well groomed. Not in acute distress.  Skin Rashes- No Rashes.  HEENT Head- Normal. Ear Auditory Canal -  Left- Normal. Right - Normal.Tympanic Membrane- Left- Normal. Right- Normal. Eye Sclera/Conjunctiva- Left- Normal. Right- Normal. Nose & Sinuses Nasal Mucosa- Left-  Boggy and Congested. Right-  Boggy and  Congested.Bilateral no  maxillary and  No frontal sinus pressure. Mouth & Throat Lips: Upper Lip- Normal: no dryness, cracking, pallor, cyanosis, or vesicular eruption. Lower Lip-Normal: no dryness, cracking, pallor, cyanosis or vesicular eruption. Buccal Mucosa- Bilateral- No Aphthous ulcers. Oropharynx- No Discharge or Erythema. + pnd Tonsils: Characteristics- Bilateral- No Erythema or Congestion. Size/Enlargement- Bilateral- No enlargement. Discharge-  bilateral-None.  Neck Neck- Supple. No Masses.   Chest and Lung Exam Auscultation: Breath Sounds:-Clear even and unlabored.  Cardiovascular Auscultation:Rythm- Regular, rate and rhythm. Murmurs & Other Heart Sounds:Ausculatation of the heart reveal- No Murmurs.  Lymphatic Head & Neck General Head & Neck Lymphatics: Bilateral: Description- No Localized lymphadenopathy.      Assessment & Plan:  Your are clinically much better now after hospitalized and treatment.  For wheezing continue the prednisone taper. I am making shorter taper dose of prednisone  to use again over next week if you flare again. Continue your inhalers.  For elevated white count and pneumonia I will put in cbc and cxr to be done in 7-10 days. Currently wbc may be falsely elevated due to recent prednisone use.  For recent nasal congestoin restart steroid nasal spray  Follow up in 7-10 days or as needed    Kalieb Freeland, Percell Miller, [employer]

## 2015-08-16 NOTE — Progress Notes (Signed)
Pre visit review using our clinic review tool, if applicable. No additional management support is needed unless otherwise documented below in the visit note. 

## 2015-08-27 ENCOUNTER — Other Ambulatory Visit (INDEPENDENT_AMBULATORY_CARE_PROVIDER_SITE_OTHER): Payer: Medicare Other

## 2015-08-27 ENCOUNTER — Ambulatory Visit (HOSPITAL_BASED_OUTPATIENT_CLINIC_OR_DEPARTMENT_OTHER)
Admission: RE | Admit: 2015-08-27 | Discharge: 2015-08-27 | Disposition: A | Discharge status: Home / Self Care | Payer: Medicare Other | Source: Ambulatory Visit | Attending: Medical | Admitting: Medical

## 2015-08-27 DIAGNOSIS — K449 Diaphragmatic hernia without obstruction or gangrene: Secondary | ICD-10-CM | POA: Diagnosis not present

## 2015-08-27 DIAGNOSIS — D72829 Elevated white blood cell count, unspecified: Secondary | ICD-10-CM | POA: Diagnosis not present

## 2015-08-27 DIAGNOSIS — J189 Pneumonia, unspecified organism: Secondary | ICD-10-CM

## 2015-08-27 DIAGNOSIS — J9811 Atelectasis: Secondary | ICD-10-CM | POA: Diagnosis not present

## 2015-08-27 LAB — CBC WITH DIFFERENTIAL/PLATELET
BASOS PCT: 0.5 % (ref 0.0–3.0)
Basophils Absolute: 0 10*3/uL (ref 0.0–0.1)
Eosinophils Absolute: 0.1 10*3/uL (ref 0.0–0.7)
Eosinophils Relative: 1 % (ref 0.0–5.0)
HEMATOCRIT: 38.7 % (ref 36.0–46.0)
Hemoglobin: 12.8 g/dL (ref 12.0–15.0)
LYMPHS PCT: 17.5 % (ref 12.0–46.0)
Lymphs Abs: 1.3 10*3/uL (ref 0.7–4.0)
MCHC: 33.1 g/dL (ref 30.0–36.0)
MCV: 90.7 fl (ref 78.0–100.0)
MONOS PCT: 8.1 % (ref 3.0–12.0)
Monocytes Absolute: 0.6 10*3/uL (ref 0.1–1.0)
NEUTROS ABS: 5.5 10*3/uL (ref 1.4–7.7)
Neutrophils Relative %: 72.9 % (ref 43.0–77.0)
PLATELETS: 184 10*3/uL (ref 150.0–400.0)
RBC: 4.26 Mil/uL (ref 3.87–5.11)
RDW: 13.6 % (ref 11.5–15.5)
WBC: 7.6 10*3/uL (ref 4.0–10.5)

## 2015-10-16 ENCOUNTER — Other Ambulatory Visit: Payer: Self-pay | Admitting: Family Medicine

## 2015-10-16 NOTE — Telephone Encounter (Signed)
Last filled: 05/16/15 Amt: 60, 4 Last OV:  07/15/15  Med filled.

## 2015-11-16 ENCOUNTER — Other Ambulatory Visit: Payer: Self-pay | Admitting: Family Medicine

## 2015-11-25 DIAGNOSIS — C678 Malignant neoplasm of overlapping sites of bladder: Secondary | ICD-10-CM | POA: Diagnosis not present

## 2015-11-29 DIAGNOSIS — Z23 Encounter for immunization: Secondary | ICD-10-CM | POA: Diagnosis not present

## 2015-12-09 ENCOUNTER — Other Ambulatory Visit: Payer: Self-pay | Admitting: Family Medicine

## 2015-12-11 ENCOUNTER — Other Ambulatory Visit: Payer: Self-pay | Admitting: Family Medicine

## 2015-12-11 NOTE — Telephone Encounter (Signed)
Rx request to pharmacy/[facility]  

## 2016-01-16 ENCOUNTER — Encounter: Payer: Self-pay | Admitting: Family Medicine

## 2016-01-16 ENCOUNTER — Ambulatory Visit (INDEPENDENT_AMBULATORY_CARE_PROVIDER_SITE_OTHER): Payer: Medicare Other | Admitting: Family Medicine

## 2016-01-16 VITALS — BP 140/86 | HR 85 | Temp 98.1°F | Wt 213.4 lb

## 2016-01-16 DIAGNOSIS — Z0000 Encounter for general adult medical examination without abnormal findings: Secondary | ICD-10-CM | POA: Diagnosis not present

## 2016-01-16 DIAGNOSIS — E669 Obesity, unspecified: Secondary | ICD-10-CM

## 2016-01-16 DIAGNOSIS — I10 Essential (primary) hypertension: Secondary | ICD-10-CM

## 2016-01-16 DIAGNOSIS — F32A Depression, unspecified: Secondary | ICD-10-CM

## 2016-01-16 DIAGNOSIS — E785 Hyperlipidemia, unspecified: Secondary | ICD-10-CM

## 2016-01-16 DIAGNOSIS — D649 Anemia, unspecified: Secondary | ICD-10-CM

## 2016-01-16 DIAGNOSIS — F329 Major depressive disorder, single episode, unspecified: Secondary | ICD-10-CM

## 2016-01-16 DIAGNOSIS — E1169 Type 2 diabetes mellitus with other specified complication: Secondary | ICD-10-CM | POA: Diagnosis not present

## 2016-01-16 LAB — LIPID PANEL
CHOLESTEROL: 193 mg/dL (ref 0–200)
HDL: 68.5 mg/dL (ref >39.00)
LDL Cholesterol: 99 mg/dL (ref 0–99)
NONHDL: 124.93
Total CHOL/HDL Ratio: 3
Triglycerides: 129 mg/dL (ref 0.0–149.0)
VLDL: 25.8 mg/dL (ref 0.0–40.0)

## 2016-01-16 LAB — CBC
HEMATOCRIT: 41.2 % (ref 36.0–46.0)
HEMOGLOBIN: 13.7 g/dL (ref 12.0–15.0)
MCHC: 33.3 g/dL (ref 30.0–36.0)
MCV: 89.5 fl (ref 78.0–100.0)
Platelets: 213 10*3/uL (ref 150.0–400.0)
RBC: 4.6 Mil/uL (ref 3.87–5.11)
RDW: 13.1 % (ref 11.5–15.5)
WBC: 7.6 10*3/uL (ref 4.0–10.5)

## 2016-01-16 LAB — COMPREHENSIVE METABOLIC PANEL
ALK PHOS: 52 U/L (ref 39–117)
ALT: 13 U/L (ref 0–35)
AST: 16 U/L (ref 0–37)
Albumin: 4.3 g/dL (ref 3.5–5.2)
BUN: 17 mg/dL (ref 6–23)
CO2: 28 mEq/L (ref 19–32)
CREATININE: 0.64 mg/dL (ref 0.40–1.20)
Calcium: 9.8 mg/dL (ref 8.4–10.5)
Chloride: 99 mEq/L (ref 96–112)
GFR: 98.26 mL/min (ref >60.00)
GLUCOSE: 116 mg/dL — AB (ref 70–99)
POTASSIUM: 4.2 meq/L (ref 3.5–5.1)
SODIUM: 137 meq/L (ref 135–145)
TOTAL PROTEIN: 6.9 g/dL (ref 6.0–8.3)
Total Bilirubin: 0.4 mg/dL (ref 0.2–1.2)

## 2016-01-16 LAB — TSH: TSH: 1.32 u[IU]/mL (ref 0.35–4.50)

## 2016-01-16 LAB — HEMOGLOBIN A1C: Hgb A1c MFr Bld: 6.9 % — ABNORMAL HIGH (ref 4.6–6.5)

## 2016-01-16 LAB — HEPATITIS C ANTIBODY: HCV Ab: NEGATIVE

## 2016-01-16 NOTE — Patient Instructions (Signed)
Melatonin 2-10 mg at bed    Insomnia Insomnia is a sleep disorder that makes it difficult to fall asleep or to stay asleep. Insomnia can cause tiredness (fatigue), low energy, difficulty concentrating, mood swings, and poor performance at work or school.  There are three different ways to classify insomnia:  Difficulty falling asleep.  Difficulty staying asleep.  Waking up too early in the morning. Any type of insomnia can be long-term (chronic) or short-term (acute). Both are common. Short-term insomnia usually lasts for three months or less. Chronic insomnia occurs at least three times a week for longer than three months. CAUSES  Insomnia may be caused by another condition, situation, or substance, such as:  Anxiety.  Certain medicines.  Gastroesophageal reflux disease (GERD) or other gastrointestinal conditions.  Asthma or other breathing conditions.  Restless legs syndrome, sleep apnea, or other sleep disorders.  Chronic pain.  Menopause. This may include hot flashes.  Stroke.  Abuse of alcohol, tobacco, or illegal drugs.  Depression.  Caffeine.   Neurological disorders, such as Alzheimer disease.  An overactive thyroid (hyperthyroidism). The cause of insomnia may not be known. RISK FACTORS Risk factors for insomnia include:  Gender. Women are more commonly affected than men.  Age. Insomnia is more common as you get older.  Stress. This may involve your professional or personal life.  Income. Insomnia is more common in people with lower income.  Lack of exercise.   Irregular work schedule or night shifts.  Traveling between different time zones. SIGNS AND SYMPTOMS If you have insomnia, trouble falling asleep or trouble staying asleep is the main symptom. This may lead to other symptoms, such as:  Feeling fatigued.  Feeling nervous about going to sleep.  Not feeling rested in the morning.  Having trouble concentrating.  Feeling irritable,  anxious, or depressed. TREATMENT  Treatment for insomnia depends on the cause. If your insomnia is caused by an underlying condition, treatment will focus on addressing the condition. Treatment may also include:   Medicines to help you sleep.  Counseling or therapy.  Lifestyle adjustments. HOME CARE INSTRUCTIONS   Take medicines only as directed by your health care provider.  Keep regular sleeping and waking hours. Avoid naps.  Keep a sleep diary to help you and your health care provider figure out what could be causing your insomnia. Include:   When you sleep.  When you wake up during the night.  How well you sleep.   How rested you feel the next day.  Any side effects of medicines you are taking.  What you eat and drink.   Make your bedroom a comfortable place where it is easy to fall asleep:  Put up shades or special blackout curtains to block light from outside.  Use a white noise machine to block noise.  Keep the temperature cool.   Exercise regularly as directed by your health care provider. Avoid exercising right before bedtime.  Use relaxation techniques to manage stress. Ask your health care provider to suggest some techniques that may work well for you. These may include:  Breathing exercises.  Routines to release muscle tension.  Visualizing peaceful scenes.  Cut back on alcohol, caffeinated beverages, and cigarettes, especially close to bedtime. These can disrupt your sleep.  Do not overeat or eat spicy foods right before bedtime. This can lead to digestive discomfort that can make it hard for you to sleep.  Limit screen use before bedtime. This includes:  Watching TV.  Using your smartphone, tablet,  and computer.  Stick to a routine. This can help you fall asleep faster. Try to do a quiet activity, brush your teeth, and go to bed at the same time each night.  Get out of bed if you are still awake after 15 minutes of trying to sleep. Keep the  lights down, but try reading or doing a quiet activity. When you feel sleepy, go back to bed.  Make sure that you drive carefully. Avoid driving if you feel very sleepy.  Keep all follow-up appointments as directed by your health care provider. This is important. SEEK MEDICAL CARE IF:   You are tired throughout the day or have trouble in your daily routine due to sleepiness.  You continue to have sleep problems or your sleep problems get worse. SEEK IMMEDIATE MEDICAL CARE IF:   You have serious thoughts about hurting yourself or someone else.   This information is not intended to replace advice given to you by your health care provider. Make sure you discuss any questions you have with your health care provider.   Document Released: 02/28/2000 Document Revised: 11/21/2014 Document Reviewed: 12/01/2013 Elsevier Interactive Patient Education [employer].

## 2016-01-16 NOTE — Progress Notes (Signed)
Pre visit review using our clinic review tool, if applicable. No additional management support is needed unless otherwise documented below in the visit note. 

## 2016-01-16 NOTE — Progress Notes (Signed)
Patient ID: Savannah Stanley, female   DOB: 02-23-1951, 65 y.o.   MRN: [id number]   Subjective:    Patient ID: Savannah Stanley, female    DOB: [birth year], 65 y.o.   MRN: [id number]  Chief Complaint  Patient presents with  . Follow-up    HPI Patient is in today for a follow patient was recently in the hospital for pneumonia 09/07/15. Patient today states she is doing well today with no acute concerns. She denies polyuria or polydipsia. Denies CP/palp/SOB/HA/congestion/fevers/GI or GU c/o. Taking meds as prescribed  Past Medical History:  Diagnosis Date  . Bladder cancer ([facility]) UROLOGIST-- DR Diona Fanti   HIGH-GRADE UROTHELIAL CARCINOMA  . Bladder tumor "multiple"   "some ORs; some removed in office"  . COPD (chronic obstructive pulmonary disease) ([place])   . Depression   . Diabetes mellitus type 2 in obese ([place]) 01/31/2015  . GERD (gastroesophageal reflux disease)   . H/O hiatal hernia   . High cholesterol   . History of blood transfusion 01/2013 - 02/2013   related to ORs  . Hypertension   . Iron deficiency anemia   . Murmur, heart 05/20/2014  . Pneumonia 07/2010; 08/07/2015  . Type 2 diabetes mellitus ([place])     Past Surgical History:  Procedure Laterality Date  . APPENDECTOMY  1980's  . CYSTOSCOPY N/A 12/18/2013   Procedure: CYSTOSCOPY;  Surgeon: Jorja Loa, MD;  Location: [facility];  Service: Urology;  Laterality: N/A;  . TRANSURETHRAL RESECTION OF BLADDER TUMOR N/A 02/16/2013   Procedure: TRANSURETHRAL RESECTION OF BLADDER TUMOR (TURBT);  Surgeon: Franchot Gallo, MD;  Location: [facility];  Service: Urology;  Laterality: N/A;  . TRANSURETHRAL RESECTION OF BLADDER TUMOR N/A 12/18/2013   Procedure: TRANSURETHRAL RESECTION OF BLADDER TUMOR (TURBT) ;  Surgeon: Jorja Loa, MD;  Location: [facility];  Service: Urology;  Laterality: N/A;  . TRANSURETHRAL RESECTION OF BLADDER TUMOR WITH GYRUS (TURBT-GYRUS) N/A 01/23/2013   Procedure: TRANSURETHRAL RESECTION OF BLADDER TUMOR WITH GYRUS ;  Surgeon: Franchot Gallo, MD;  Location: [facility];  Service: Urology;  Laterality: N/A;  . TUBAL LIGATION  1980's    Family History  Problem Relation Age of Onset  . Cancer Father     prostate  . Arthritis Brother     Social History   Social History  . Marital status: Widowed    Spouse name: N/A  . Number of children: N/A  . Years of education: N/A   Occupational History  . Not on file.   Social History Main Topics  . Smoking status: Former Smoker    Packs/day: 1.00    Years: 35.00    Types: Cigarettes    Quit date: 07/15/2010  . Smokeless tobacco: Never Used  . Alcohol use No  . Drug use: No  . Sexual activity: Not on file   Other Topics Concern  . Not on file   Social History Narrative  . No narrative on file    Outpatient Medications Prior to Visit  Medication Sig Dispense Refill  . albuterol (PROVENTIL HFA;VENTOLIN HFA) 108 (90 BASE) MCG/ACT inhaler Inhale 2 puffs into the lungs every 6 (six) hours as needed for wheezing or shortness of breath. Please dispense ProAir 3 Inhaler 0  . Calcium Citrate-Vitamin D (CITRACAL + D PO) Take 2 tablets by mouth 2 (two) times daily.     . citalopram (CELEXA) 40 MG tablet Take 1 tablet (40 mg total) by mouth daily. 90 tablet 2  .  Coenzyme Q10 (COQ10) 200 MG CAPS Take 1 capsule by mouth daily.    . ferrous sulfate (FEOSOL) 325 (65 FE) MG tablet Take 325 mg by mouth daily with breakfast.     . guaiFENesin (MUCINEX) 600 MG 12 hr tablet Take 1 tablet (600 mg total) by mouth 2 (two) times daily. 20 tablet 0  . losartan (COZAAR) 50 MG tablet Take 1 tablet by mouth  daily 90 tablet 2  . metFORMIN (GLUCOPHAGE) 500 MG tablet TAKE ONE TABLET BY MOUTH TWICE DAILY WITH MEALS 60 tablet 5  . metoprolol succinate (TOPROL-XL) 25 MG 24 hr tablet TAKE ONE TABLET BY MOUTH ONCE DAILY 90 tablet 0  . Multiple Vitamins-Minerals (ZINC PO) Take 1 tablet by mouth daily.    Marland Kitchen omeprazole  (PRILOSEC) 20 MG capsule Take 1 capsule (20 mg total) by mouth 2 (two) times daily as needed. 180 capsule 2  . pravastatin (PRAVACHOL) 10 MG tablet Take 1 tablet by mouth  daily 90 tablet 1  . SYMBICORT 160-4.5 MCG/ACT inhaler Use 2 puffs two times daily 30.6 g 1  . EPINEPHrine 0.3 mg/0.3 mL IJ SOAJ injection Inject 0.3 mLs (0.3 mg total) into the muscle once. (Patient not taking: Reported on 01/16/2016) 1 Device 1  . predniSONE (DELTASONE) 10 MG tablet 4 tab po day 1, 3 tab po day 2, 2 tab po day 3, 1 tab po day 4. 10 tablet 0   Facility-Administered Medications Prior to Visit  Medication Dose Route Frequency Provider Last Rate Last Dose  . mitoMYcin (MUTAMYCIN) chemo injection 40 mg  40 mg Bladder Instillation Once Franchot Gallo, MD        Allergies  Allergen Reactions  . Bee Venom Shortness Of Breath  . Other Shortness Of Breath    MSG.  . Latex Itching    Review of Systems  Constitutional: Negative for fever.  Eyes: Negative for blurred vision.  Respiratory: Negative for cough and shortness of breath.   Cardiovascular: Negative for chest pain and palpitations.  Gastrointestinal: Negative for vomiting.  Musculoskeletal: Negative for back pain.  Skin: Negative for rash.  Neurological: Negative for loss of consciousness and headaches.       Objective:    Physical Exam  Constitutional: She is oriented to person, place, and time. She appears well-developed and well-nourished. No distress.  HENT:  Head: Normocephalic and atraumatic.  Eyes: Conjunctivae are normal.  Neck: Normal range of motion. No thyromegaly present.  Cardiovascular: Normal rate and regular rhythm.   Pulmonary/Chest: Effort normal and breath sounds normal. She has no wheezes.  Abdominal: Soft. Bowel sounds are normal. There is no tenderness.  Musculoskeletal: Normal range of motion. She exhibits no edema or deformity.  Neurological: She is alert and oriented to person, place, and time.  Skin: Skin is  warm and dry. She is not diaphoretic.  Psychiatric: She has a normal mood and affect.    BP 140/86   Pulse 85   Temp 98.1 F (36.7 C) (Oral)   Wt 213 lb 6.4 oz (96.8 kg)   SpO2 93%   BMI 37.80 kg/m  Wt Readings from Last 3 Encounters:  01/16/16 213 lb 6.4 oz (96.8 kg)  08/16/15 202 lb (91.6 kg)  08/07/15 207 lb 7.3 oz (94.1 kg)     Lab Results  Component Value Date   WBC 7.6 01/16/2016   HGB 13.7 01/16/2016   HCT 41.2 01/16/2016   PLT 213.0 01/16/2016   GLUCOSE 116 (H) 01/16/2016   CHOL 193  01/16/2016   TRIG 129.0 01/16/2016   HDL 68.50 01/16/2016   LDLCALC 99 01/16/2016   ALT 13 01/16/2016   AST 16 01/16/2016   NA 137 01/16/2016   K 4.2 01/16/2016   CL 99 01/16/2016   CREATININE 0.64 01/16/2016   BUN 17 01/16/2016   CO2 28 01/16/2016   TSH 1.32 01/16/2016   INR 1.04 01/18/2013   HGBA1C 6.9 (H) 01/16/2016   MICROALBUR <0.7 07/15/2015    Lab Results  Component Value Date   TSH 1.32 01/16/2016   Lab Results  Component Value Date   WBC 7.6 01/16/2016   HGB 13.7 01/16/2016   HCT 41.2 01/16/2016   MCV 89.5 01/16/2016   PLT 213.0 01/16/2016   Lab Results  Component Value Date   NA 137 01/16/2016   K 4.2 01/16/2016   CO2 28 01/16/2016   GLUCOSE 116 (H) 01/16/2016   BUN 17 01/16/2016   CREATININE 0.64 01/16/2016   BILITOT 0.4 01/16/2016   ALKPHOS 52 01/16/2016   AST 16 01/16/2016   ALT 13 01/16/2016   PROT 6.9 01/16/2016   ALBUMIN 4.3 01/16/2016   CALCIUM 9.8 01/16/2016   ANIONGAP 9 08/09/2015   GFR 98.26 01/16/2016   Lab Results  Component Value Date   CHOL 193 01/16/2016   Lab Results  Component Value Date   HDL 68.50 01/16/2016   Lab Results  Component Value Date   LDLCALC 99 01/16/2016   Lab Results  Component Value Date   TRIG 129.0 01/16/2016   Lab Results  Component Value Date   CHOLHDL 3 01/16/2016   Lab Results  Component Value Date   HGBA1C 6.9 (H) 01/16/2016       Assessment & Plan:   Problem List Items Addressed  This Visit    Anemia   Depression    Doing well on Citalopram. No changes      Hypertension - Primary    Well controlled, no changes to meds. Encouraged heart healthy diet such as the DASH diet and exercise as tolerated.       Relevant Orders   TSH (Completed)   CBC (Completed)   Comprehensive metabolic panel (Completed)   Hyperlipidemia    Encouraged heart healthy diet, increase exercise, avoid trans fats, consider a krill oil cap daily      Relevant Orders   Lipid panel (Completed)   Diabetes mellitus type 2 in obese (HCC)    hgba1c acceptable, minimize simple carbs. Increase exercise as tolerated. Continue current meds      Relevant Orders   Hemoglobin A1c (Completed)    Other Visit Diagnoses    Preventative health care       Relevant Orders   Hepatitis C Antibody (Completed)      I have discontinued Ms. Dinse's predniSONE. I am also having her maintain her ferrous sulfate, Calcium Citrate-Vitamin D (CITRACAL + D PO), albuterol, Multiple Vitamins-Minerals (ZINC PO), CoQ10, EPINEPHrine, omeprazole, losartan, citalopram, SYMBICORT, guaiFENesin, metFORMIN, pravastatin, and metoprolol succinate.  No orders of the defined types were placed in this encounter.    Penni Homans, MD

## 2016-01-19 NOTE — Assessment & Plan Note (Signed)
Doing well on Citalopram. No changes

## 2016-01-19 NOTE — Assessment & Plan Note (Signed)
Well controlled, no changes to meds. Encouraged heart healthy diet such as the DASH diet and exercise as tolerated.  °

## 2016-01-19 NOTE — Assessment & Plan Note (Signed)
hgba1c acceptable, minimize simple carbs. Increase exercise as tolerated. Continue current meds 

## 2016-01-19 NOTE — Assessment & Plan Note (Signed)
Encouraged heart healthy diet, increase exercise, avoid trans fats, consider a krill oil cap daily 

## 2016-02-15 ENCOUNTER — Other Ambulatory Visit: Payer: Self-pay | Admitting: Family Medicine

## 2016-03-02 ENCOUNTER — Other Ambulatory Visit: Payer: Self-pay | Admitting: Family Medicine

## 2016-03-10 ENCOUNTER — Other Ambulatory Visit: Payer: Self-pay | Admitting: Family Medicine

## 2016-03-12 ENCOUNTER — Other Ambulatory Visit: Payer: Self-pay | Admitting: Family Medicine

## 2016-03-12 MED ORDER — METOPROLOL SUCCINATE ER 25 MG PO TB24: 25.0000 mg | ORAL_TABLET | Freq: Every day | ORAL | 3 refills | Status: DC

## 2016-03-12 MED ORDER — METFORMIN HCL 500 MG PO TABS: 500.0000 mg | ORAL_TABLET | Freq: Two times a day (BID) | ORAL | 3 refills | Status: DC

## 2016-04-06 ENCOUNTER — Other Ambulatory Visit: Payer: Self-pay | Admitting: Family Medicine

## 2016-04-28 ENCOUNTER — Other Ambulatory Visit: Payer: Self-pay | Admitting: Family Medicine

## 2016-05-27 DIAGNOSIS — C678 Malignant neoplasm of overlapping sites of bladder: Secondary | ICD-10-CM | POA: Diagnosis not present

## 2016-07-09 ENCOUNTER — Other Ambulatory Visit: Payer: Medicare Other

## 2016-07-16 ENCOUNTER — Encounter: Payer: Medicare Other | Admitting: Family Medicine

## 2016-08-13 ENCOUNTER — Other Ambulatory Visit (INDEPENDENT_AMBULATORY_CARE_PROVIDER_SITE_OTHER): Payer: Medicare Other

## 2016-08-13 DIAGNOSIS — Z0000 Encounter for general adult medical examination without abnormal findings: Secondary | ICD-10-CM

## 2016-08-13 DIAGNOSIS — I10 Essential (primary) hypertension: Secondary | ICD-10-CM | POA: Diagnosis not present

## 2016-08-13 DIAGNOSIS — E785 Hyperlipidemia, unspecified: Secondary | ICD-10-CM | POA: Diagnosis not present

## 2016-08-13 LAB — LIPID PANEL
CHOLESTEROL: 173 mg/dL (ref 0–200)
HDL: 64.7 mg/dL (ref >39.00)
LDL Cholesterol: 87 mg/dL (ref 0–99)
NONHDL: 108.67
TRIGLYCERIDES: 108 mg/dL (ref 0.0–149.0)
Total CHOL/HDL Ratio: 3
VLDL: 21.6 mg/dL (ref 0.0–40.0)

## 2016-08-13 LAB — HEMOGLOBIN A1C: Hgb A1c MFr Bld: 7 % — ABNORMAL HIGH (ref 4.6–6.5)

## 2016-08-13 LAB — CBC
HEMATOCRIT: 42.3 % (ref 36.0–46.0)
HEMOGLOBIN: 14 g/dL (ref 12.0–15.0)
MCHC: 33 g/dL (ref 30.0–36.0)
MCV: 91.5 fl (ref 78.0–100.0)
PLATELETS: 212 10*3/uL (ref 150.0–400.0)
RBC: 4.63 Mil/uL (ref 3.87–5.11)
RDW: 13.4 % (ref 11.5–15.5)
WBC: 6 10*3/uL (ref 4.0–10.5)

## 2016-08-13 LAB — COMPREHENSIVE METABOLIC PANEL
ALK PHOS: 50 U/L (ref 39–117)
ALT: 13 U/L (ref 0–35)
AST: 17 U/L (ref 0–37)
Albumin: 4.2 g/dL (ref 3.5–5.2)
BILIRUBIN TOTAL: 0.5 mg/dL (ref 0.2–1.2)
BUN: 18 mg/dL (ref 6–23)
CALCIUM: 9.3 mg/dL (ref 8.4–10.5)
CO2: 29 mEq/L (ref 19–32)
Chloride: 99 mEq/L (ref 96–112)
Creatinine, Ser: 0.67 mg/dL (ref 0.40–1.20)
GFR: 93.04 mL/min (ref >60.00)
Glucose, Bld: 147 mg/dL — ABNORMAL HIGH (ref 70–99)
POTASSIUM: 4.5 meq/L (ref 3.5–5.1)
Sodium: 136 mEq/L (ref 135–145)
TOTAL PROTEIN: 6.7 g/dL (ref 6.0–8.3)

## 2016-08-13 LAB — TSH: TSH: 1.17 u[IU]/mL (ref 0.35–4.50)

## 2016-08-18 ENCOUNTER — Other Ambulatory Visit: Payer: Self-pay | Admitting: Family Medicine

## 2016-08-18 DIAGNOSIS — R062 Wheezing: Secondary | ICD-10-CM

## 2016-08-18 MED ORDER — ALBUTEROL SULFATE HFA 108 (90 BASE) MCG/ACT IN AERS: 2.0000 | INHALATION_SPRAY | Freq: Four times a day (QID) | RESPIRATORY_TRACT | 3 refills | Status: DC | PRN

## 2016-08-19 NOTE — Progress Notes (Signed)
Subjective:   Savannah Stanley is a 66 y.o. female who presents for Medicare Annual (Subsequent) preventive examination.  She has some concerns about her out of pocket costs for inhalers. Each inhaler (Proair and Symbicort) costs her several hundred dollars for each prescription. She would like [place] to contact her insurance company for prior [place] at [phone].   Review of Systems:  No ROS.  Medicare Wellness Visit.  Cardiac Risk Factors include: advanced age (>38men, >69 women);diabetes mellitus;dyslipidemia;hypertension;obesity (BMI >30kg/m2)  Sleep patterns: has restless sleep, gets up 1 times nightly to void and sleeps 4-6 hours nightly.   Home Safety/Smoke Alarms: Feels safe in home. Smoke alarms in place. Security system in place. Living environment; residence and Firearm Safety: Lives alone. 2-story house, can live on one level, walk-in shower. Seat Belt Safety/Bike Helmet: Wears seat belt.   Counseling:   Eye Exam- Does not follow w/ eye doctor regularly. Dental- Follows w/ dentist as needed. Cannot remember name of provider.  Female:   Pap- Aged out       [facility]- Not on file. Declined.      Dexa scan- Not on file. Declined. CCS- Not on file. Declined.     Objective:     Vitals: BP 120/78   Pulse 76   Ht 5\' 2"  (1.575 m)   Wt 211 lb 6.4 oz (95.9 kg)   SpO2 96%   BMI 38.67 kg/m   Body mass index is 38.67 kg/m.   Tobacco History  Smoking Status  . Former Smoker  . Packs/day: 1.00  . Years: 35.00  . Types: Cigarettes  . Quit date: 07/15/2010  Smokeless Tobacco  . Never Used     Counseling given: Not Answered   Past Medical History:  Diagnosis Date  . Bladder cancer (HCC) UROLOGIST-- DR Diona Fanti   HIGH-GRADE UROTHELIAL CARCINOMA  . Bladder tumor "multiple"   "some ORs; some removed in office"  . COPD (chronic obstructive pulmonary disease) ([place])   . Depression   . Diabetes mellitus type 2 in obese ([place]) 01/31/2015  . GERD (gastroesophageal reflux  disease)   . H/O hiatal hernia   . High cholesterol   . History of blood transfusion 01/2013 - 02/2013   related to ORs  . Hypertension   . Iron deficiency anemia   . Murmur, heart 05/20/2014  . Pneumonia 07/2010; 08/07/2015  . Type 2 diabetes mellitus ([place])    Past Surgical History:  Procedure Laterality Date  . APPENDECTOMY  1980's  . CYSTOSCOPY N/A 12/18/2013   Procedure: CYSTOSCOPY;  Surgeon: Jorja Loa, MD;  Location: [facility];  Service: Urology;  Laterality: N/A;  . TRANSURETHRAL RESECTION OF BLADDER TUMOR N/A 02/16/2013   Procedure: TRANSURETHRAL RESECTION OF BLADDER TUMOR (TURBT);  Surgeon: Franchot Gallo, MD;  Location[facility];  Service: Urology;  Laterality: N/A;  . TRANSURETHRAL RESECTION OF BLADDER TUMOR N/A 12/18/2013   Procedure: TRANSURETHRAL RESECTION OF BLADDER TUMOR (TURBT) ;  Surgeon: Jorja Loa, MD;  Location: [facility];  Service: Urology;  Laterality: N/A;  . TRANSURETHRAL RESECTION OF BLADDER TUMOR WITH GYRUS (TURBT-GYRUS) N/A 01/23/2013   Procedure: TRANSURETHRAL RESECTION OF BLADDER TUMOR WITH GYRUS ;  Surgeon: Franchot Gallo, MD;  Location: [facility];  Service: Urology;  Laterality: N/A;  . TUBAL LIGATION  1980's   Family History  Problem Relation Age of Onset  . Cancer Father        prostate  . Arthritis Brother    History  Sexual Activity  . Sexual activity: Not on file    Outpatient Encounter Prescriptions as of 08/20/2016  Medication Sig  . albuterol (PROVENTIL HFA;VENTOLIN HFA) 108 (90 Base) MCG/ACT inhaler Inhale 2 puffs into the lungs every 6 (six) hours as needed for wheezing or shortness of breath. Please dispense ProAir  . Calcium Citrate-Vitamin D (CITRACAL + D PO) Take 2 tablets by mouth 2 (two) times daily.   . citalopram (CELEXA) 40 MG tablet TAKE ONE TABLET BY MOUTH ONCE DAILY  . Coenzyme Q10 (COQ10) 200 MG CAPS Take 1 capsule by mouth daily.  . ferrous sulfate (FEOSOL)  325 (65 FE) MG tablet Take 325 mg by mouth daily with breakfast.   . guaiFENesin (MUCINEX) 600 MG 12 hr tablet Take 1 tablet (600 mg total) by mouth 2 (two) times daily. (Patient taking differently: Take 600 mg by mouth 2 (two) times daily as needed. )  . losartan (COZAAR) 50 MG tablet TAKE 1 TABLET BY MOUTH  DAILY  . MELATONIN PO Take 1 tablet by mouth at bedtime.  . metFORMIN (GLUCOPHAGE) 500 MG tablet Take 1 tablet (500 mg total) by mouth 2 (two) times daily with a meal.  . metoprolol succinate (TOPROL-XL) 25 MG 24 hr tablet Take 1 tablet (25 mg total) by mouth daily.  . Multiple Vitamins-Minerals (ZINC PO) Take 1 tablet by mouth daily.  Marland Kitchen omeprazole (PRILOSEC) 20 MG capsule TAKE 1 CAPSULE BY MOUTH TWO TIMES DAILY AS NEEDED (Patient taking differently: TAKE 1 CAPSULE BY MOUTH DAILY)  . pravastatin (PRAVACHOL) 10 MG tablet TAKE 1 TABLET BY MOUTH  DAILY  . Probiotic Product (PROBIOTIC PO) Take 1 capsule by mouth daily.  . SYMBICORT 160-4.5 MCG/ACT inhaler USE 2 PUFFS TWO TIMES DAILY  . [DISCONTINUED] citalopram (CELEXA) 40 MG tablet Take 1 tablet (40 mg total) by mouth daily.  Marland Kitchen EPINEPHrine 0.3 mg/0.3 mL IJ SOAJ injection Inject 0.3 mLs (0.3 mg total) into the muscle once. (Patient not taking: Reported on 01/16/2016)   Facility-Administered Encounter Medications as of 08/20/2016  Medication  . mitoMYcin (MUTAMYCIN) chemo injection 40 mg    Activities of Daily Living In your present state of health, do you have any difficulty performing the following activities: 08/20/2016  Hearing? N  Vision? N  Difficulty concentrating or making decisions? N  Walking or climbing stairs? N  Dressing or bathing? N  Doing errands, shopping? N  Preparing Food and eating ? N  Using the Toilet? N  In the past six months, have you accidently leaked urine? N  Do you have problems with loss of bowel control? N  Managing your Medications? N  Managing your Finances? N  Housekeeping or managing your Housekeeping? N   Some recent data might be hidden    Patient Care Team: [name], MD as PCP - General (Family Medicine) Franchot Gallo, MD as Consulting Physician (Urology)    Assessment:    Physical assessment deferred to PCP.  Exercise Activities and Dietary recommendations Current Exercise Habits: Home exercise routine, Type of exercise: walking  Diet (meal preparation, eat out, water intake, caffeinated beverages, dairy products, fruits and vegetables): in general, a "healthy" diet  , diabetic. Goes out to eat a few times weekly. Tries to snack on 'healthy stuff' in the evenings instead of eating a large meal. Tries to avoid concentrated sugars. Drinks coffee and decaf iced tea w/ Splenda. Does not drinks soda often-twice monthly on average. Drinks 2-3 oz cups of water daily.   Goals    .  Increase physical activity          Restart swimming/water aerobics this summer.      Fall Risk Fall Risk  08/20/2016 07/15/2015 11/17/2013  Falls in the past year? No No No   Depression Screen PHQ 2/9 Scores 08/20/2016 07/15/2015 11/17/2013  PHQ - 2 Score 0 0 0     Cognitive Function MMSE - Mini Mental State Exam 08/20/2016  Orientation to time 5  Orientation to Place 5  Registration 3  Attention/ Calculation 5  Recall 3  Language- name 2 objects 2  Language- repeat 1  Language- follow 3 step command 3  Language- read & follow direction 1  Write a sentence 1  Copy design 1  Total score 30        Immunization History  Administered Date(s) Administered  . Influenza Whole 11/24/2010  . Influenza, Seasonal, Injecte, Preservative Fre 12/28/2014  . Influenza,inj,Quad PF,36+ Mos 11/17/2013  . Influenza-Unspecified 11/17/2012, 11/29/2015  . Pneumococcal Conjugate-13 11/17/2013  . Pneumococcal Polysaccharide-23 11/09/2011  . Tdap 11/24/2010  . Zoster 03/17/2007   Screening Tests Health Maintenance  Topic Date Due  . FOOT EXAM  08/30/1960  . OPHTHALMOLOGY EXAM  08/30/1960  . MAMMOGRAM   11/23/2012  . DEXA SCAN  08/29/2013  . INFLUENZA VACCINE  10/14/2016  . PNA vac Low Risk Adult (2 of 2 - PPSV23) 11/08/2016  . HEMOGLOBIN A1C  02/12/2017  . COLONOSCOPY  11/23/2020  . TETANUS/TDAP  11/23/2020  . Hepatitis C Screening  Completed      Plan:    Follow-up w/ PCP as scheduled.   Create and bring a copy of your advance directives to your next office visit.  Consider compression stockings for daytime ankle swelling.  Declines ophtha referral today. She will contact us when she is ready for referral.  Declines MMG, DEXA, and CCS.   Prior authorization initiated, awaiting determination (see phone note).   I have personally reviewed and noted the following in the patient's chart:   . Medical and social history . Use of alcohol, tobacco or illicit drugs  . Current medications and supplements . Functional ability and status . Nutritional status . Physical activity . Advanced directives . List of other physicians . Vitals . Screenings to include cognitive, depression, and falls . Referrals and appointments  In addition, I have reviewed and discussed with patient certain preventive protocols, quality metrics, and best practice recommendations. A written personalized care plan for preventive services as well as general preventive health recommendations were provided to patient.     Dorrene German, RN  08/20/2016

## 2016-08-20 ENCOUNTER — Encounter: Payer: Self-pay | Admitting: *Deleted

## 2016-08-20 ENCOUNTER — Telehealth: Payer: Self-pay | Admitting: *Deleted

## 2016-08-20 ENCOUNTER — Encounter: Payer: Medicare Other | Admitting: Family Medicine

## 2016-08-20 ENCOUNTER — Ambulatory Visit (INDEPENDENT_AMBULATORY_CARE_PROVIDER_SITE_OTHER): Payer: Medicare Other | Admitting: *Deleted

## 2016-08-20 VITALS — BP 120/78 | HR 76 | Ht 62.0 in | Wt 211.4 lb

## 2016-08-20 DIAGNOSIS — Z0000 Encounter for general adult medical examination without abnormal findings: Secondary | ICD-10-CM | POA: Diagnosis not present

## 2016-08-20 NOTE — Patient Instructions (Addendum)
Savannah Stanley , Thank you for taking time to come for your Medicare Wellness Visit. I appreciate your ongoing commitment to your health goals. Please review the following plan we discussed and let me know if I can assist you in the future.   Create and bring a copy of your advance directives to your next office visit.  Consider the new shingles vaccine, Shingrix. This is available at most pharmacies.  Let us know when you are ready to set up your eye appointment and we can place a referral for you.  These are the goals we discussed: Goals    . Increase physical activity          Restart swimming/water aerobics this summer.       This is a list of the screening recommended for you and due dates:  Health Maintenance  Topic Date Due  . Complete foot exam   [birth year]30/1960  . Eye exam for diabetics  08[birth year]30/1960  . Mammogram  11/23/2012  . DEXA scan (bone density measurement)  08/29/2013  . Flu Shot  10/14/2016  . Pneumonia vaccines (2 of 2 - PPSV23) 11/08/2016  . Hemoglobin A1C  02/12/2017  . Colon Cancer Screening  11/23/2020  . Tetanus Vaccine  11/23/2020  .  Hepatitis C: One time screening is recommended by Center for Disease Control  (CDC) for  adults born from [birth year] through 1967.   Completed     Preventive Care 68 Years and Older, Female Preventive care refers to lifestyle choices and visits with your health care provider that can promote health and wellness. What does preventive care include?  A yearly physical exam. This is also called an annual well check.  Dental exams once or twice a year.  Routine eye exams. Ask your health care provider how often you should have your eyes checked.  Personal lifestyle choices, including: ? Daily care of your teeth and gums. ? Regular physical activity. ? Eating a healthy diet. ? Avoiding tobacco and drug use. ? Limiting alcohol use. ? Practicing safe sex. ? Taking low-dose aspirin every day. ? Taking vitamin and mineral supplements as  recommended by your health care provider. What happens during an annual well check? The services and screenings done by your health care provider during your annual well check will depend on your age, overall health, lifestyle risk factors, and family history of disease. Counseling Your health care provider may ask you questions about your:  Alcohol use.  Tobacco use.  Drug use.  Emotional well-being.  Home and relationship well-being.  Sexual activity.  Eating habits.  History of falls.  Memory and ability to understand (cognition).  Work and work Statistician.  Reproductive health.  Screening You may have the following tests or measurements:  Height, weight, and BMI.  Blood pressure.  Lipid and cholesterol levels. These may be checked every 5 years, or more frequently if you are over 15 years old.  Skin check.  Lung cancer screening. You may have this screening every year starting at age 43 if you have a 30-pack-year history of smoking and currently smoke or have quit within the past 15 years.  Fecal occult blood test (FOBT) of the stool. You may have this test every year starting at age 14.  Flexible sigmoidoscopy or colonoscopy. You may have a sigmoidoscopy every 5 years or a colonoscopy every 10 years starting at age 71.  Hepatitis C blood test.  Hepatitis B blood test.  Sexually transmitted disease (STD) testing.  Diabetes screening.  This is done by checking your blood sugar (glucose) after you have not eaten for a while (fasting). You may have this done every 1-3 years.  Bone density scan. This is done to screen for osteoporosis. You may have this done starting at age 41.  Mammogram. This may be done every 1-2 years. Talk to your health care provider about how often you should have regular mammograms.  Talk with your health care provider about your test results, treatment options, and if necessary, the need for more tests. Vaccines Your health care  provider may recommend certain vaccines, such as:  Influenza vaccine. This is recommended every year.  Tetanus, diphtheria, and acellular pertussis (Tdap, Td) vaccine. You may need a Td booster every 10 years.  Varicella vaccine. You may need this if you have not been vaccinated.  Zoster vaccine. You may need this after age 13.  Measles, mumps, and rubella (MMR) vaccine. You may need at least one dose of MMR if you were born in 1957 or later. You may also need a second dose.  Pneumococcal 13-valent conjugate (PCV13) vaccine. One dose is recommended after age 28.  Pneumococcal polysaccharide (PPSV23) vaccine. One dose is recommended after age 61.  Meningococcal vaccine. You may need this if you have certain conditions.  Hepatitis A vaccine. You may need this if you have certain conditions or if you travel or work in places where you may be exposed to hepatitis A.  Hepatitis B vaccine. You may need this if you have certain conditions or if you travel or work in places where you may be exposed to hepatitis B.  Haemophilus influenzae type b (Hib) vaccine. You may need this if you have certain conditions.  Talk to your health care provider about which screenings and vaccines you need and how often you need them. This information is not intended to replace advice given to you by your health care provider. Make sure you discuss any questions you have with your health care provider. Document Released: 03/29/2015 Document Revised: 11/20/2015 Document Reviewed: 01/01/2015 Elsevier Interactive Patient Education  2017 [employer].

## 2016-08-20 NOTE — Telephone Encounter (Signed)
Prior authorization initiated by phone for [place]. Member ID# [id number]. Awaiting determination via fax to [phone].

## 2016-08-21 ENCOUNTER — Encounter: Payer: Self-pay | Admitting: Family Medicine

## 2016-08-21 NOTE — Telephone Encounter (Signed)
Received [place]  Approval for [facility] 03/15/2017. Pharmacy informed Copy sent to scan

## 2016-10-09 ENCOUNTER — Other Ambulatory Visit: Payer: Self-pay

## 2016-10-28 ENCOUNTER — Other Ambulatory Visit: Payer: Self-pay | Admitting: Family Medicine

## 2016-11-10 ENCOUNTER — Other Ambulatory Visit: Payer: Self-pay | Admitting: Family Medicine

## 2016-11-12 IMAGING — DX DG CHEST 2V
2 series · 2 of 2 positions shown · non-contrast
Comparison: 01/18/2013

CLINICAL DATA: Fever, chills, sweats

EXAM:
CHEST  2 VIEW

[chest pa]
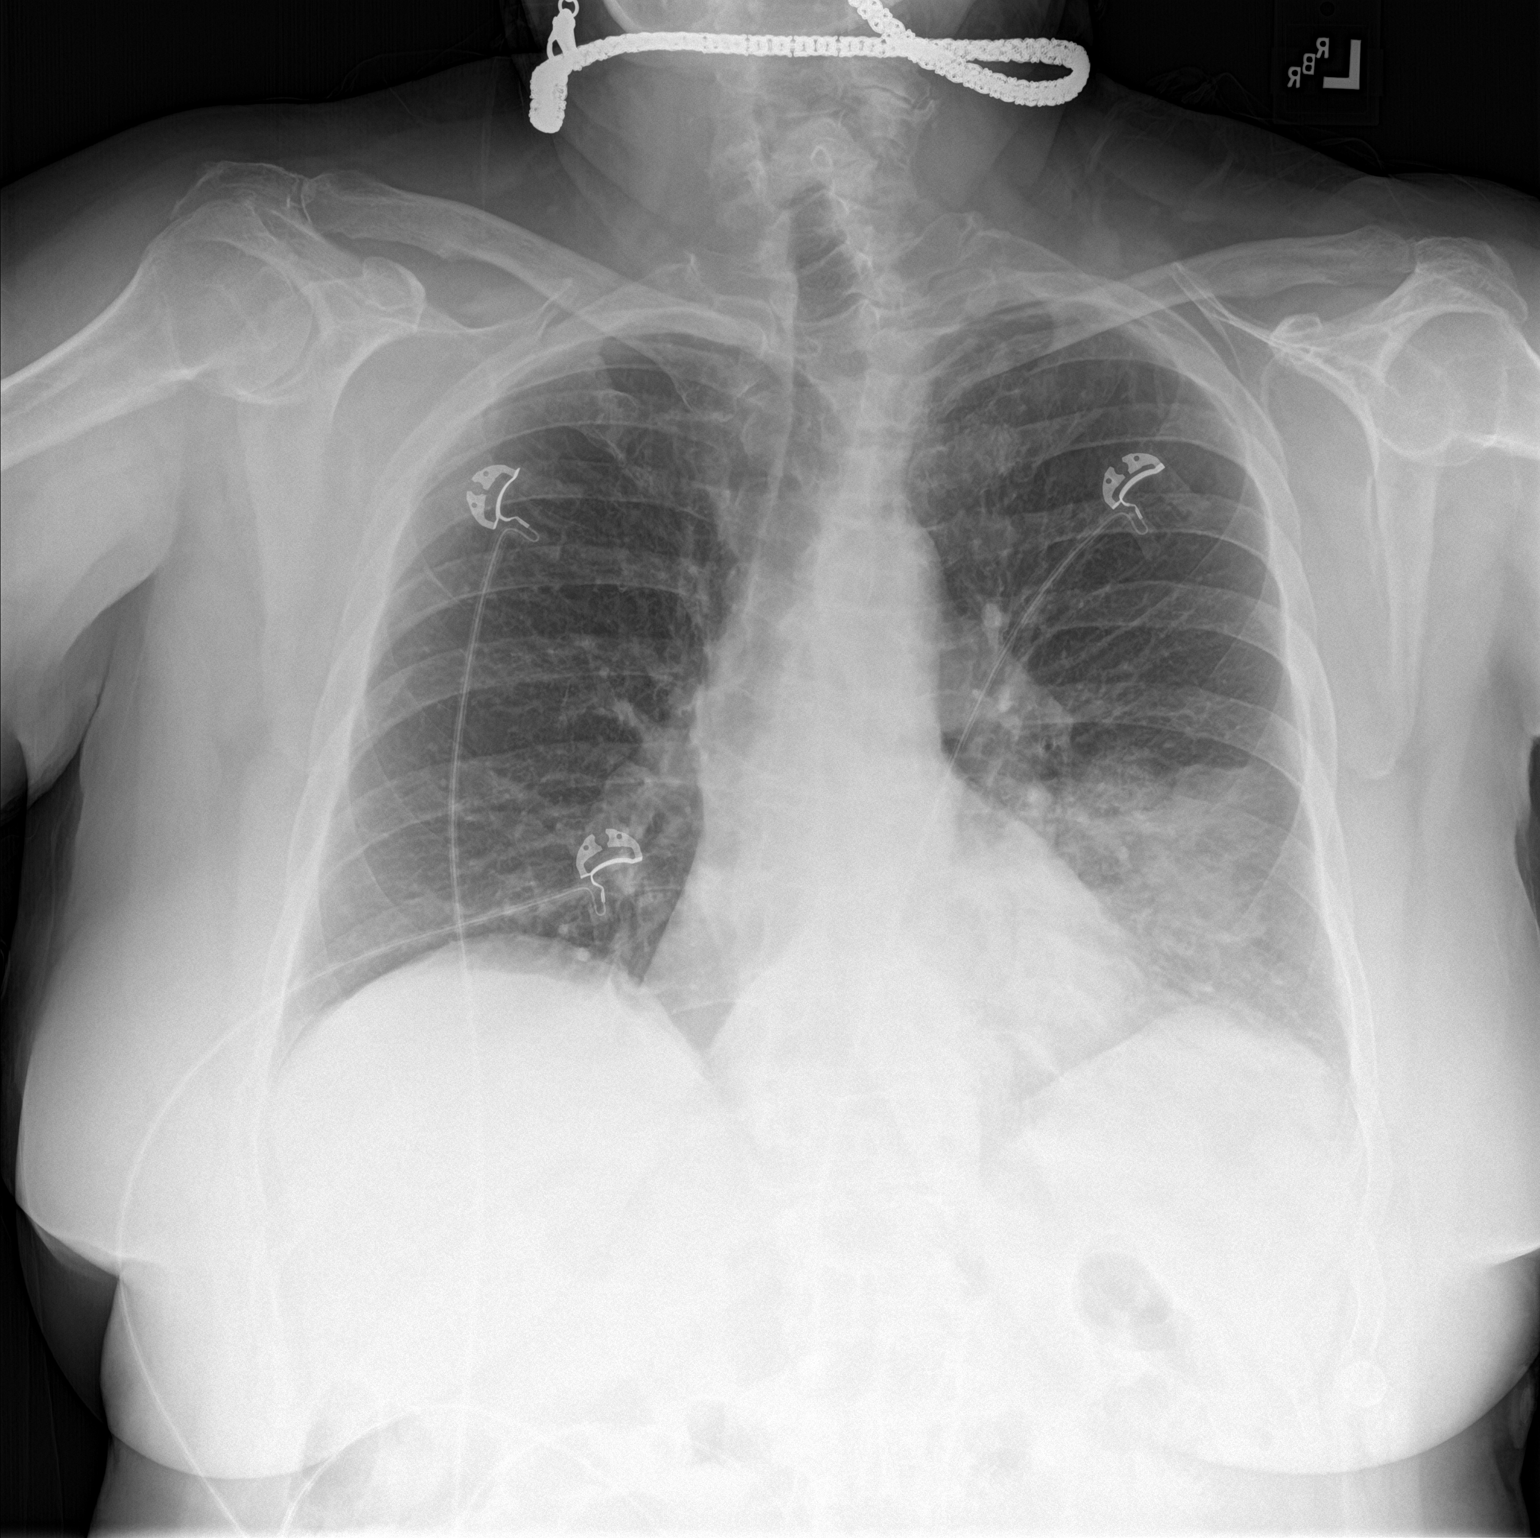

[chest lat]
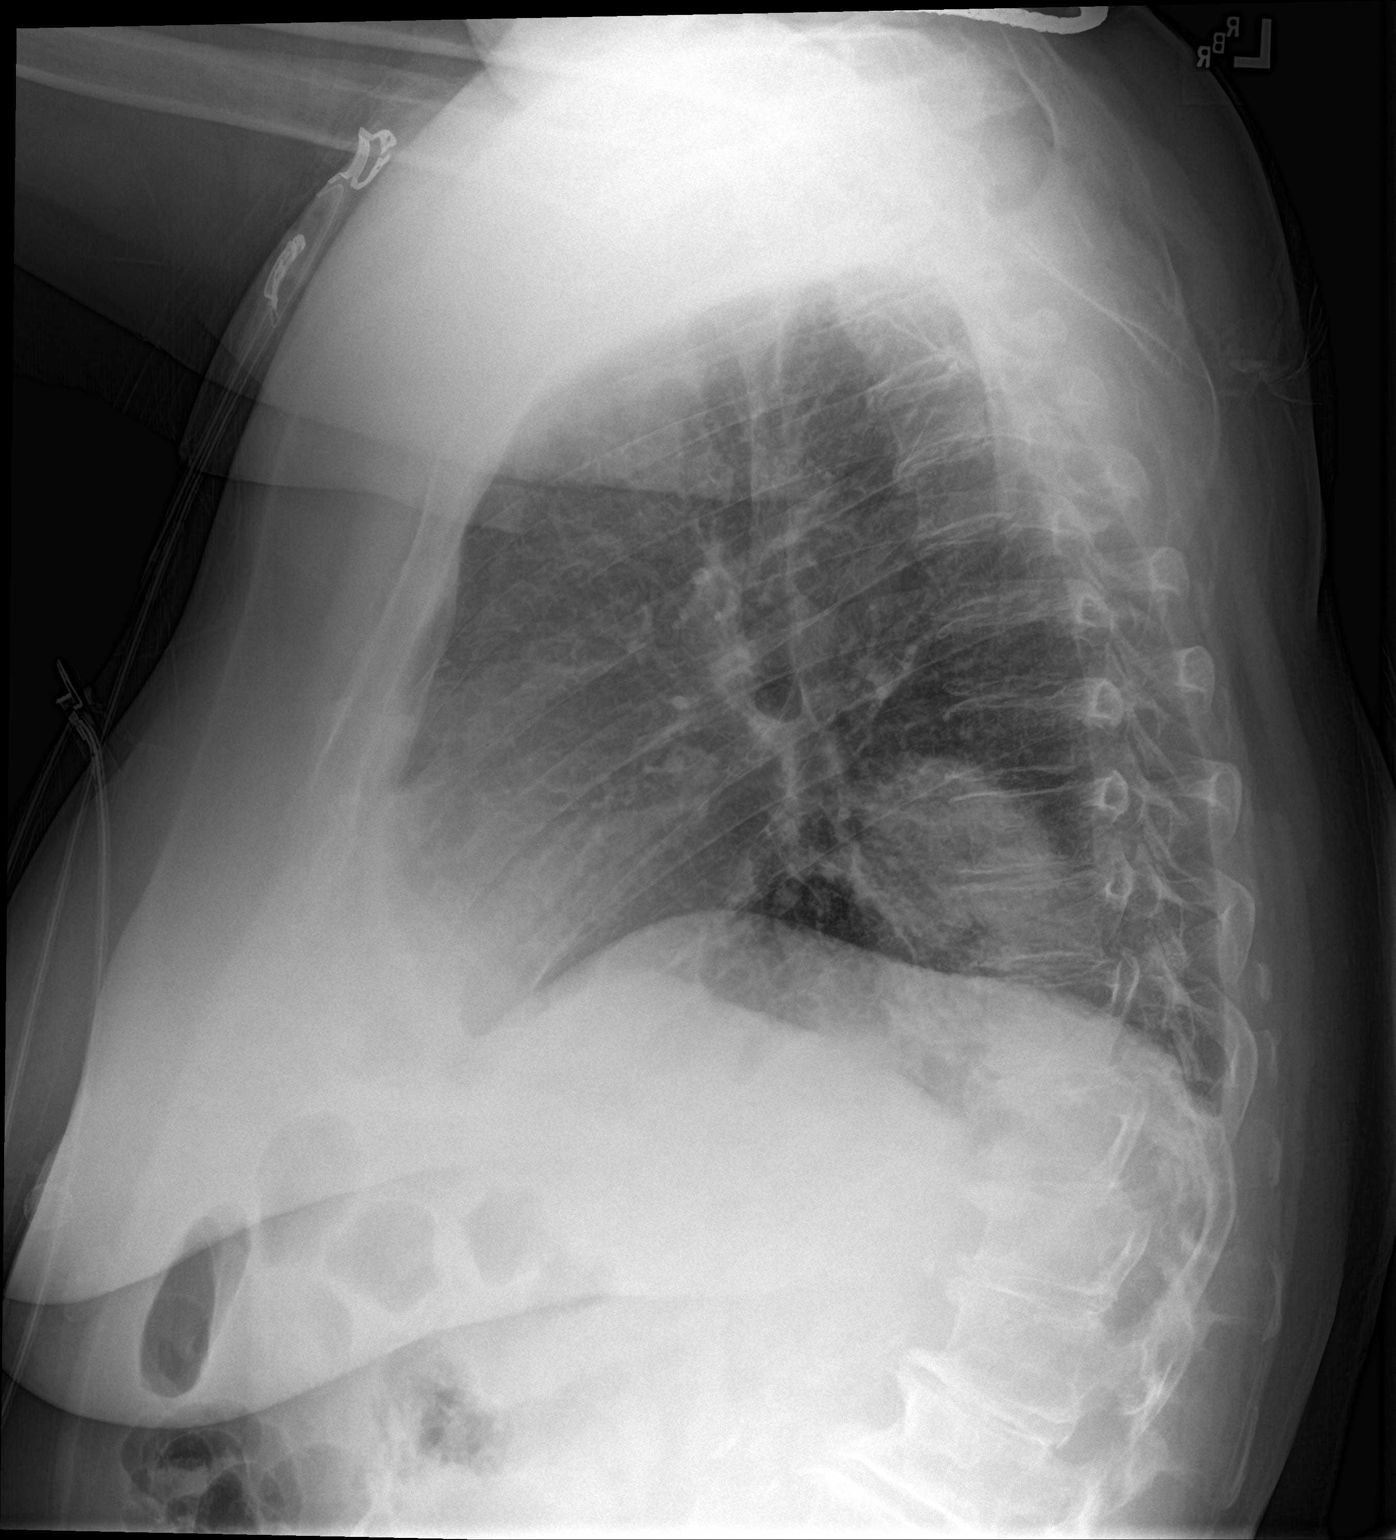

[2 of 2 positions shown; findings below may reference images not displayed]

FINDINGS: There is left lower lobe airspace disease. There is no pleural
effusion or pneumothorax. The heart and mediastinal contours are
unremarkable.

The osseous structures are unremarkable.
IMPRESSION: Left lower lobe pneumonia. Followup PA and lateral chest X-ray is
recommended in 3-4 weeks following trial of antibiotic therapy to
ensure resolution and exclude underlying malignancy.

## 2016-11-27 DIAGNOSIS — R339 Retention of urine, unspecified: Secondary | ICD-10-CM | POA: Diagnosis not present

## 2016-11-27 DIAGNOSIS — C678 Malignant neoplasm of overlapping sites of bladder: Secondary | ICD-10-CM | POA: Diagnosis not present

## 2016-12-02 DIAGNOSIS — Z23 Encounter for immunization: Secondary | ICD-10-CM | POA: Diagnosis not present

## 2016-12-02 IMAGING — DX DG CHEST 2V
2 series · 2 of 2 positions shown · non-contrast
Comparison: August 07, 2015.

CLINICAL DATA: Recent pneumonia.  Hypertension.

EXAM:
CHEST  2 VIEW

[chest pa]
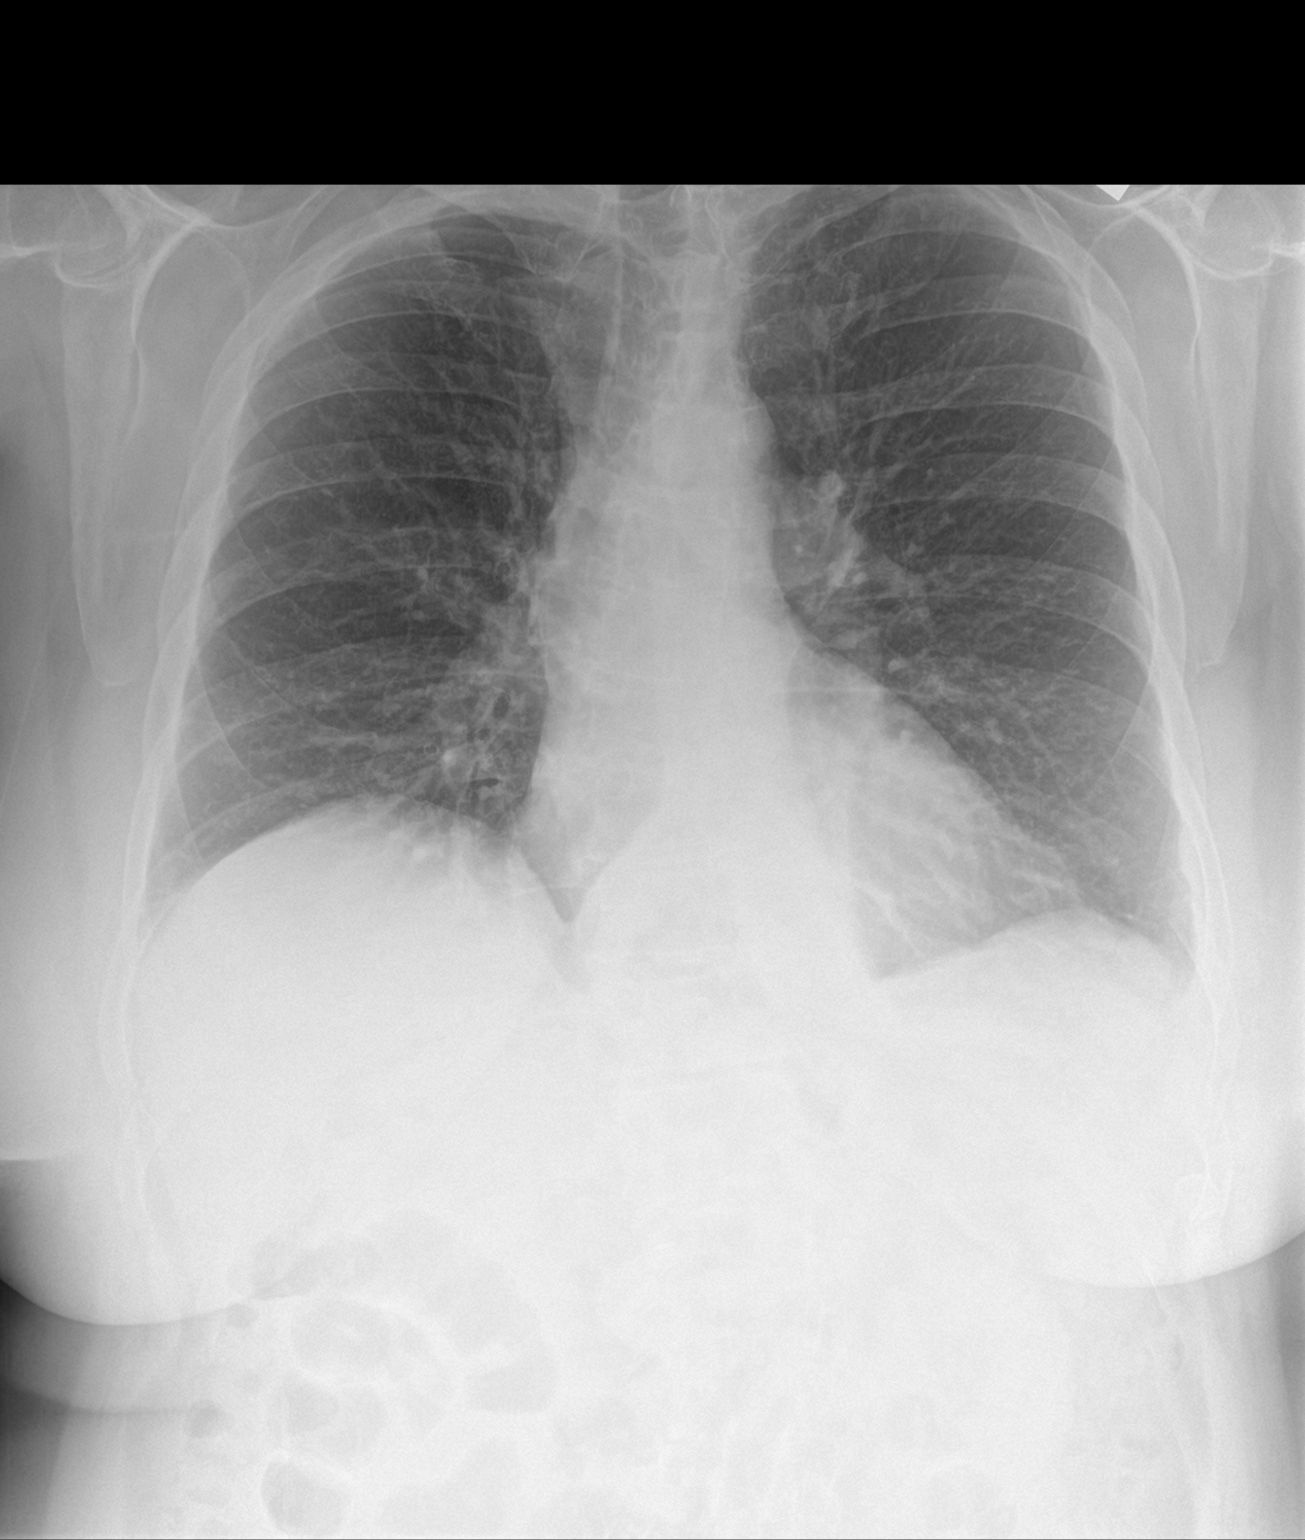

[chest lat]
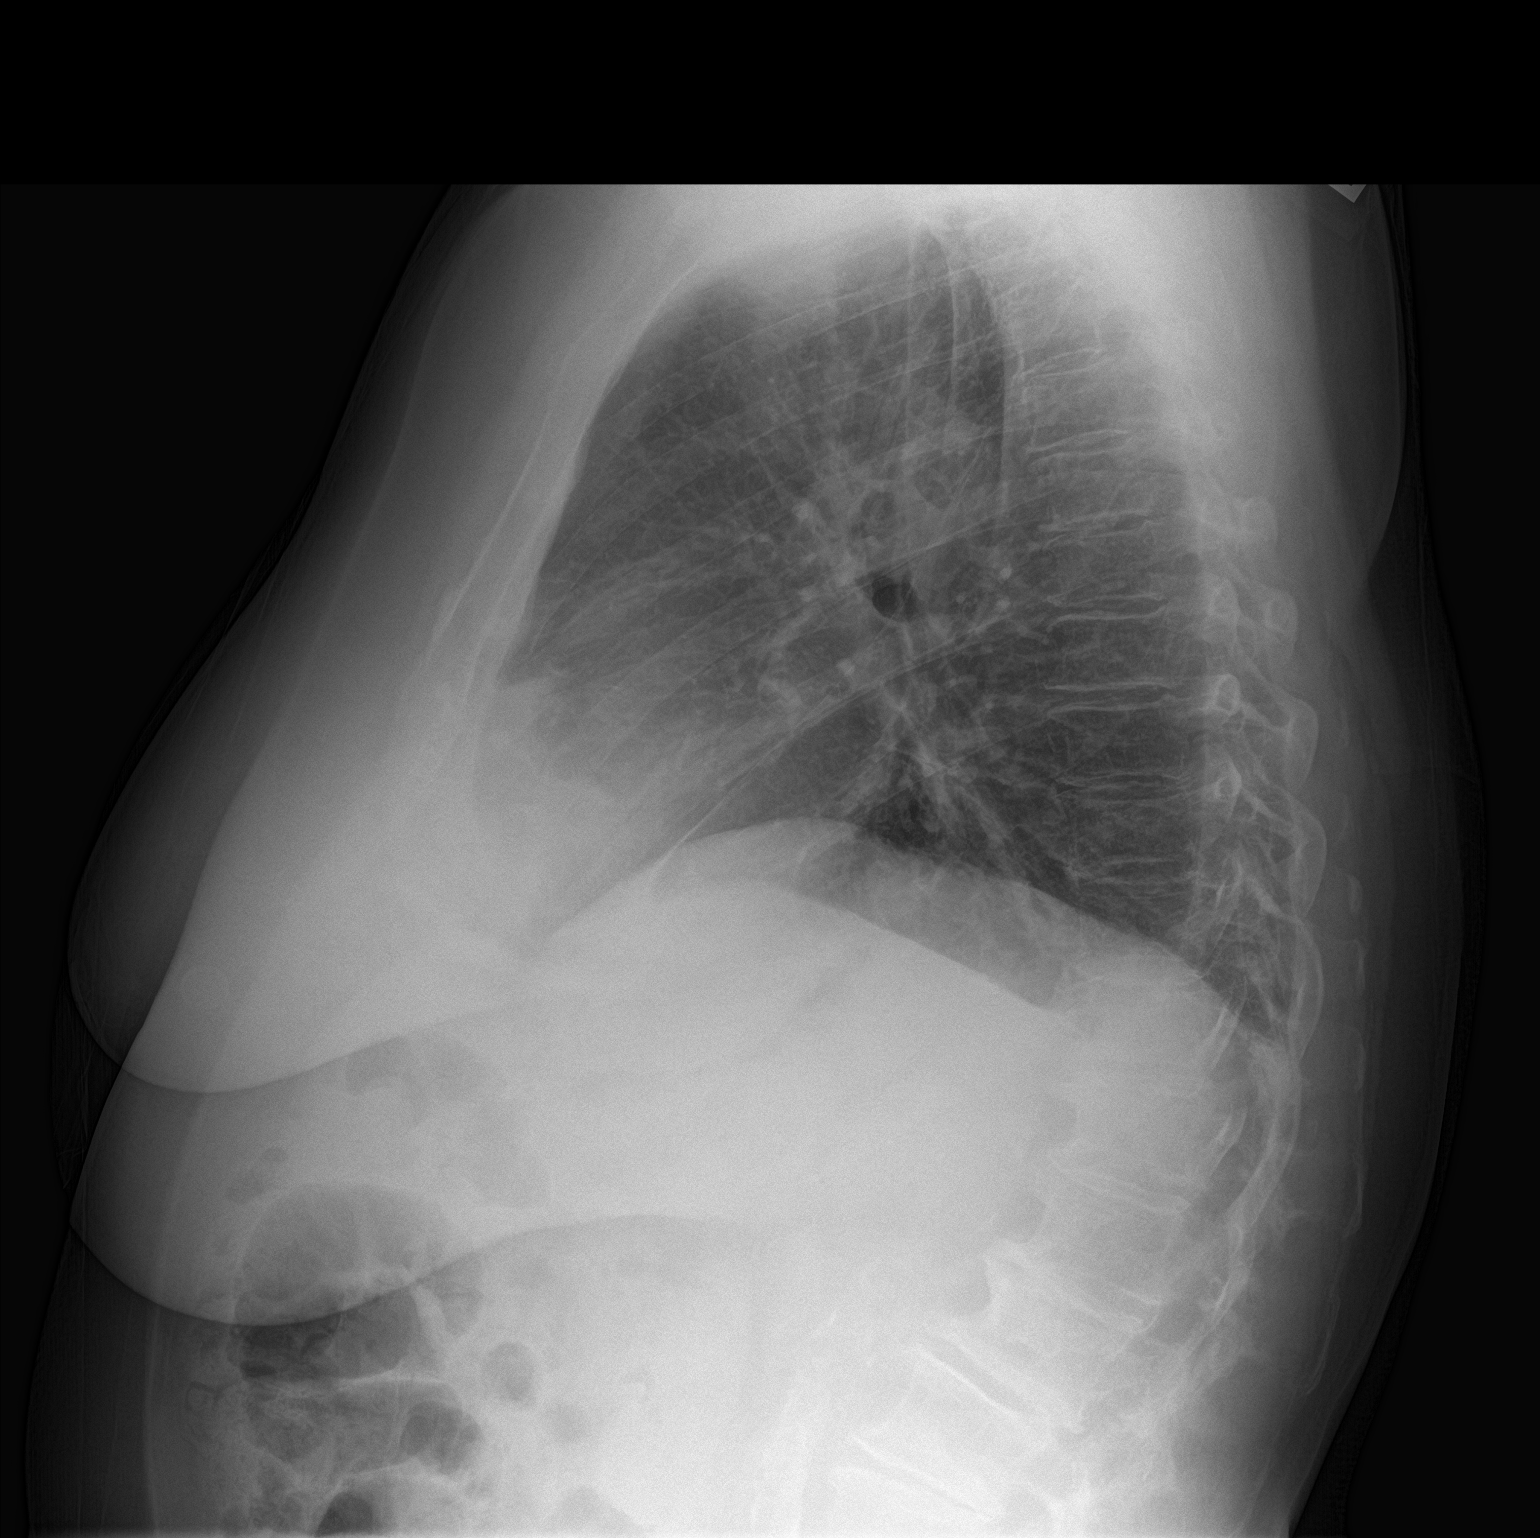

[2 of 2 positions shown; findings below may reference images not displayed]

FINDINGS: There has been interval clearing of left lower lobe airspace
consolidation. There is minimal atelectasis in the left lower lobe
currently. Lungs elsewhere clear. Heart size and pulmonary
vascularity are normal. No adenopathy. There is a focal hiatal
hernia. There is degenerative change in the upper lumbar spine.
IMPRESSION: Interval clearing of left lower lobe airspace consolidation with
only minimal left lower lobe atelectasis currently. Lungs elsewhere
clear. Stable cardiac silhouette. Hiatal hernia present.

## 2016-12-22 ENCOUNTER — Other Ambulatory Visit: Payer: Self-pay | Admitting: Family Medicine

## 2017-01-12 ENCOUNTER — Ambulatory Visit (INDEPENDENT_AMBULATORY_CARE_PROVIDER_SITE_OTHER): Payer: Medicare Other | Admitting: Family Medicine

## 2017-01-12 DIAGNOSIS — E1169 Type 2 diabetes mellitus with other specified complication: Secondary | ICD-10-CM

## 2017-01-12 DIAGNOSIS — E669 Obesity, unspecified: Secondary | ICD-10-CM | POA: Diagnosis not present

## 2017-01-12 DIAGNOSIS — J209 Acute bronchitis, unspecified: Secondary | ICD-10-CM

## 2017-01-12 DIAGNOSIS — J440 Chronic obstructive pulmonary disease with acute lower respiratory infection: Secondary | ICD-10-CM | POA: Diagnosis not present

## 2017-01-12 DIAGNOSIS — I10 Essential (primary) hypertension: Secondary | ICD-10-CM | POA: Diagnosis not present

## 2017-01-12 MED ORDER — CEFDINIR 300 MG PO CAPS: 300.0000 mg | ORAL_CAPSULE | Freq: Two times a day (BID) | ORAL | 0 refills | Status: AC

## 2017-01-12 MED ORDER — METHYLPREDNISOLONE 4 MG PO TABS: ORAL_TABLET | ORAL | 0 refills | Status: DC

## 2017-01-12 NOTE — Patient Instructions (Addendum)
Plain Mucinex twice, vitamin 500 to 1000, probiotics, elderberry, Sambucol, hydrate well Acute Bronchitis, Adult Acute bronchitis is when air tubes (bronchi) in the lungs suddenly get swollen. The condition can make it hard to breathe. It can also cause these symptoms:  A cough.  Coughing up clear, yellow, or green mucus.  Wheezing.  Chest congestion.  Shortness of breath.  A fever.  Body aches.  Chills.  A sore throat.  Follow these instructions at home: Medicines  Take over-the-counter and prescription medicines only as told by your doctor.  If you were prescribed an antibiotic medicine, take it as told by your doctor. Do not stop taking the antibiotic even if you start to feel better. General instructions  Rest.  Drink enough fluids to keep your pee (urine) clear or pale yellow.  Avoid smoking and secondhand smoke. If you smoke and you need help quitting, ask your doctor. Quitting will help your lungs heal faster.  Use an inhaler, cool mist vaporizer, or humidifier as told by your doctor.  Keep all follow-up visits as told by your doctor. This is important. How is this prevented? To lower your risk of getting this condition again:  Wash your hands often with soap and water. If you cannot use soap and water, use hand sanitizer.  Avoid contact with people who have cold symptoms.  Try not to touch your hands to your mouth, nose, or eyes.  Make sure to get the flu shot every year.  Contact a doctor if:  Your symptoms do not get better in 2 weeks. Get help right away if:  You cough up blood.  You have chest pain.  You have very bad shortness of breath.  You become dehydrated.  You faint (pass out) or keep feeling like you are going to pass out.  You keep throwing up (vomiting).  You have a very bad headache.  Your fever or chills gets worse. This information is not intended to replace advice given to you by your health care provider. Make sure you  discuss any questions you have with your health care provider. Document Released: 08/19/2007 Document Revised: 10/09/2015 Document Reviewed: 08/21/2015 Elsevier Interactive Patient Education  2017 [employer].

## 2017-01-12 NOTE — Progress Notes (Signed)
Subjective:  I acted as a Education administrator for Dr. Charlett Blake. Savannah Stanley, [place]  Patient ID: Savannah Stanley, female    DOB: 04-17-1950, 66 y.o.   MRN: [id number]  No chief complaint on file.   HPI  Patient is in today for an acute visit for possible pneumonia. She has been feeling ill x about 10 days. Her grandson is ill and she has been sick since seeing him. She has a cough which is productive at times with green sputum. Has been noting sore throat and facial pressure. Is struggling with malaise and fatigue. Denies CP/palp/SOB/HA/congestion/fevers/GI or GU c/o. Taking meds as prescribed  Patient Care Team: [name], MD as PCP - General (Family Medicine) Franchot Gallo, MD as Consulting Physician (Urology)   Past Medical History:  Diagnosis Date  . Acute bronchitis with COPD ([place]) 03/19/2015  . Bladder cancer (HCC) UROLOGIST-- DR Diona Fanti   HIGH-GRADE UROTHELIAL CARCINOMA  . Bladder tumor "multiple"   "some ORs; some removed in office"  . COPD (chronic obstructive pulmonary disease) ([place])   . Depression   . Diabetes mellitus type 2 in obese ([place]) 01/31/2015  . GERD (gastroesophageal reflux disease)   . H/O hiatal hernia   . High cholesterol   . History of blood transfusion 01/2013 - 02/2013   related to ORs  . Hypertension   . Iron deficiency anemia   . Murmur, heart 05/20/2014  . Pneumonia 07/2010; 08/07/2015  . Type 2 diabetes mellitus ([place])     Past Surgical History:  Procedure Laterality Date  . APPENDECTOMY  1980's  . TUBAL LIGATION  1980's    Family History  Problem Relation Age of Onset  . Cancer Father        prostate  . Arthritis Brother     Social History   Socioeconomic History  . Marital status: Widowed    Spouse name: Not on file  . Number of children: Not on file  . Years of education: Not on file  . Highest education level: Not on file  Social Needs  . Financial resource strain: Not on file  . Food insecurity - worry: Not on file  . Food insecurity -  inability: Not on file  . Transportation needs - medical: Not on file  . Transportation needs - non-medical: Not on file  Occupational History  . Not on file  Tobacco Use  . Smoking status: Former Smoker    Packs/day: 1.00    Years: 35.00    Pack years: 35.00    Types: Cigarettes    Last attempt to quit: 07/15/2010    Years since quitting: 6.5  . Smokeless tobacco: Never Used  Substance and Sexual Activity  . Alcohol use: No  . Drug use: No  . Sexual activity: Not on file  Other Topics Concern  . Not on file  Social History Narrative  . Not on file    Outpatient Medications Prior to Visit  Medication Sig Dispense Refill  . albuterol (PROVENTIL HFA;VENTOLIN HFA) 108 (90 Base) MCG/ACT inhaler Inhale 2 puffs into the lungs every 6 (six) hours as needed for wheezing or shortness of breath. Please dispense ProAir 3 Inhaler 3  . Calcium Citrate-Vitamin D (CITRACAL + D PO) Take 2 tablets by mouth 2 (two) times daily.     . citalopram (CELEXA) 40 MG tablet TAKE ONE TABLET BY MOUTH ONCE DAILY 90 tablet 2  . Coenzyme Q10 (COQ10) 200 MG CAPS Take 1 capsule by mouth daily.    Marland Kitchen  EPINEPHrine 0.3 mg/0.3 mL IJ SOAJ injection Inject 0.3 mLs (0.3 mg total) into the muscle once. (Patient not taking: Reported on 01/16/2016) 1 Device 1  . ferrous sulfate (FEOSOL) 325 (65 FE) MG tablet Take 325 mg by mouth daily with breakfast.     . guaiFENesin (MUCINEX) 600 MG 12 hr tablet Take 1 tablet (600 mg total) by mouth 2 (two) times daily. (Patient taking differently: Take 600 mg by mouth 2 (two) times daily as needed. ) 20 tablet 0  . losartan (COZAAR) 50 MG tablet TAKE 1 TABLET BY MOUTH  DAILY 90 tablet 1  . MELATONIN PO Take 1 tablet by mouth at bedtime.    . metFORMIN (GLUCOPHAGE) 500 MG tablet Take 1 tablet (500 mg total) by mouth 2 (two) times daily with a meal. 180 tablet 3  . metoprolol succinate (TOPROL-XL) 25 MG 24 hr tablet Take 1 tablet (25 mg total) by mouth daily. 90 tablet 3  . Multiple  Vitamins-Minerals (ZINC PO) Take 1 tablet by mouth daily.    Marland Kitchen omeprazole (PRILOSEC) 20 MG capsule Take 1 capsule (20 mg total) by mouth 2 (two) times daily as needed. 180 capsule 0  . pravastatin (PRAVACHOL) 10 MG tablet Take 1 tablet (10 mg total) by mouth daily. 90 tablet 0  . Probiotic Product (PROBIOTIC PO) Take 1 capsule by mouth daily.    . SYMBICORT 160-4.5 MCG/ACT inhaler USE 2 PUFFS TWO TIMES DAILY 30.6 g 4   Facility-Administered Medications Prior to Visit  Medication Dose Route Frequency Provider Last Rate Last Dose  . mitoMYcin (MUTAMYCIN) chemo injection 40 mg  40 mg Bladder Instillation Once Franchot Gallo, MD        Allergies  Allergen Reactions  . Bee Venom Shortness Of Breath  . Other Shortness Of Breath    MSG.  . Latex Itching    Review of Systems  Constitutional: Positive for malaise/fatigue. Negative for fever.  HENT: Positive for congestion and sinus pain.   Eyes: Negative for blurred vision.  Respiratory: Positive for cough and sputum production. Negative for shortness of breath.   Cardiovascular: Negative for chest pain, palpitations and leg swelling.  Gastrointestinal: Negative for abdominal pain, blood in stool and nausea.  Genitourinary: Negative for dysuria and frequency.  Musculoskeletal: Positive for myalgias. Negative for falls.  Skin: Negative for rash.  Neurological: Negative for dizziness, loss of consciousness and headaches.  Endo/Heme/Allergies: Negative for environmental allergies.  Psychiatric/Behavioral: Negative for depression. The patient is not nervous/anxious.        Objective:    Physical Exam  Constitutional: She is oriented to person, place, and time. She appears well-developed and well-nourished. No distress.  HENT:  Head: Normocephalic and atraumatic.  Nose: Nose normal.  Nasal mucosa boggy and erythematous  Eyes: Right eye exhibits no discharge. Left eye exhibits no discharge.  Neck: Normal range of motion. Neck supple.   Cardiovascular: Normal rate and regular rhythm.  No murmur heard. Pulmonary/Chest: Effort normal and breath sounds normal.  Abdominal: Soft. Bowel sounds are normal. There is no tenderness.  Musculoskeletal: She exhibits no edema.  Neurological: She is alert and oriented to person, place, and time.  Skin: Skin is warm and dry.  Psychiatric: She has a normal mood and affect.  Nursing note and vitals reviewed.   BP 130/80 (BP Location: Left Arm, Patient Position: Sitting, Cuff Size: Normal)   Pulse 96   Temp 98.4 F (36.9 C) (Oral)   Resp 18   Wt 207 lb 3.2 oz (  94 kg)   SpO2 92%   BMI 37.90 kg/m  Wt Readings from Last 3 Encounters:  01/12/17 207 lb 3.2 oz (94 kg)  08/20/16 211 lb 6.4 oz (95.9 kg)  01/16/16 213 lb 6.4 oz (96.8 kg)   BP Readings from Last 3 Encounters:  01/12/17 130/80  08/20/16 120/78  01/19/16 140/86     Immunization History  Administered Date(s) Administered  . Influenza Whole 11/24/2010  . Influenza, Seasonal, Injecte, Preservative Fre 12/28/2014  . Influenza,inj,Quad PF,6+ Mos 11/17/2013  . Influenza-Unspecified 11/17/2012, 11/29/2015  . Pneumococcal Conjugate-13 11/17/2013  . Pneumococcal Polysaccharide-23 11/09/2011  . Tdap 11/24/2010  . Zoster 03/17/2007    Health Maintenance  Topic Date Due  . FOOT EXAM  08/30/1958  . OPHTHALMOLOGY EXAM  08/30/1958  . MAMMOGRAM  11/23/2012  . DEXA SCAN  08/29/2013  . INFLUENZA VACCINE  10/14/2016  . PNA vac Low Risk Adult (2 of 2 - PPSV23) 11/08/2016  . HEMOGLOBIN A1C  02/12/2017  . COLONOSCOPY  11/23/2020  . TETANUS/TDAP  11/23/2020  . Hepatitis C Screening  Completed    Lab Results  Component Value Date   WBC 6.0 08/13/2016   HGB 14.0 08/13/2016   HCT 42.3 08/13/2016   PLT 212.0 08/13/2016   GLUCOSE 147 (H) 08/13/2016   CHOL 173 08/13/2016   TRIG 108.0 08/13/2016   HDL 64.70 08/13/2016   LDLCALC 87 08/13/2016   ALT 13 08/13/2016   AST 17 08/13/2016   NA 136 08/13/2016   K 4.5 08/13/2016     CL 99 08/13/2016   CREATININE 0.67 08/13/2016   BUN 18 08/13/2016   CO2 29 08/13/2016   TSH 1.17 08/13/2016   INR 1.04 01/18/2013   HGBA1C 7.0 (H) 08/13/2016   MICROALBUR <0.7 07/15/2015    Lab Results  Component Value Date   TSH 1.17 08/13/2016   Lab Results  Component Value Date   WBC 6.0 08/13/2016   HGB 14.0 08/13/2016   HCT 42.3 08/13/2016   MCV 91.5 08/13/2016   PLT 212.0 08/13/2016   Lab Results  Component Value Date   NA 136 08/13/2016   K 4.5 08/13/2016   CO2 29 08/13/2016   GLUCOSE 147 (H) 08/13/2016   BUN 18 08/13/2016   CREATININE 0.67 08/13/2016   BILITOT 0.5 08/13/2016   ALKPHOS 50 08/13/2016   AST 17 08/13/2016   ALT 13 08/13/2016   PROT 6.7 08/13/2016   ALBUMIN 4.2 08/13/2016   CALCIUM 9.3 08/13/2016   ANIONGAP 9 08/09/2015   GFR 93.04 08/13/2016   Lab Results  Component Value Date   CHOL 173 08/13/2016   Lab Results  Component Value Date   HDL 64.70 08/13/2016   Lab Results  Component Value Date   LDLCALC 87 08/13/2016   Lab Results  Component Value Date   TRIG 108.0 08/13/2016   Lab Results  Component Value Date   CHOLHDL 3 08/13/2016   Lab Results  Component Value Date   HGBA1C 7.0 (H) 08/13/2016         Assessment & Plan:   Problem List Items Addressed This Visit    Hypertension    Well controlled, no changes to meds. Encouraged heart healthy diet such as the DASH diet and exercise as tolerated.       Diabetes mellitus type 2 in obese (HCC)    hgba1c acceptable, minimize simple carbs. Increase exercise as tolerated.      Acute bronchitis with COPD ([place])    Started on Cefdinir and  medrol dose pak      Relevant Medications   methylPREDNISolone (MEDROL) 4 MG tablet      I am having Shyna Q. Lorah start on methylPREDNISolone and cefdinir. I am also having her maintain her ferrous sulfate, Calcium Citrate-Vitamin D (CITRACAL + D PO), Multiple Vitamins-Minerals (ZINC PO), CoQ10, EPINEPHrine, guaiFENesin,  metFORMIN, metoprolol succinate, SYMBICORT, albuterol, Probiotic Product (PROBIOTIC PO), MELATONIN PO, losartan, pravastatin, omeprazole, and citalopram.  Meds ordered this encounter  Medications  . methylPREDNISolone (MEDROL) 4 MG tablet    Sig: 5 tab po qd X 1d then 4 tab po qd X 1d then 3 tab po qd X 1d then 2 tab po qd then 1 tab po qd    Dispense:  15 tablet    Refill:  0  . cefdinir (OMNICEF) 300 MG capsule    Sig: Take 1 capsule (300 mg total) by mouth 2 (two) times daily.    Dispense:  20 capsule    Refill:  0    CMA served as scribe during this visit. History, Physical and Plan performed by medical provider. Documentation and orders reviewed and attested to.  Penni Homans, MD

## 2017-01-14 ENCOUNTER — Other Ambulatory Visit: Payer: Self-pay

## 2017-01-14 DIAGNOSIS — E1169 Type 2 diabetes mellitus with other specified complication: Secondary | ICD-10-CM

## 2017-01-14 DIAGNOSIS — E669 Obesity, unspecified: Secondary | ICD-10-CM

## 2017-01-14 DIAGNOSIS — I10 Essential (primary) hypertension: Secondary | ICD-10-CM

## 2017-01-14 DIAGNOSIS — E785 Hyperlipidemia, unspecified: Secondary | ICD-10-CM

## 2017-01-17 ENCOUNTER — Encounter: Payer: Self-pay | Admitting: Family Medicine

## 2017-01-17 NOTE — Assessment & Plan Note (Signed)
hgba1c acceptable, minimize simple carbs. Increase exercise as tolerated.  

## 2017-01-17 NOTE — Assessment & Plan Note (Signed)
Started on Cefdinir and medrol dose pak

## 2017-01-17 NOTE — Assessment & Plan Note (Signed)
Well controlled, no changes to meds. Encouraged heart healthy diet such as the DASH diet and exercise as tolerated.  °

## 2017-01-20 ENCOUNTER — Other Ambulatory Visit: Payer: Self-pay | Admitting: Family Medicine

## 2017-01-21 ENCOUNTER — Other Ambulatory Visit: Payer: Self-pay | Admitting: Family Medicine

## 2017-02-08 ENCOUNTER — Other Ambulatory Visit (INDEPENDENT_AMBULATORY_CARE_PROVIDER_SITE_OTHER): Payer: Medicare Other

## 2017-02-08 DIAGNOSIS — E669 Obesity, unspecified: Secondary | ICD-10-CM | POA: Diagnosis not present

## 2017-02-08 DIAGNOSIS — E785 Hyperlipidemia, unspecified: Secondary | ICD-10-CM

## 2017-02-08 DIAGNOSIS — I10 Essential (primary) hypertension: Secondary | ICD-10-CM | POA: Diagnosis not present

## 2017-02-08 DIAGNOSIS — E1169 Type 2 diabetes mellitus with other specified complication: Secondary | ICD-10-CM | POA: Diagnosis not present

## 2017-02-08 LAB — HEMOGLOBIN A1C: Hgb A1c MFr Bld: 7 % — ABNORMAL HIGH (ref 4.6–6.5)

## 2017-02-08 LAB — COMPREHENSIVE METABOLIC PANEL WITH GFR
ALT: 14 U/L (ref 0–35)
AST: 16 U/L (ref 0–37)
Albumin: 4.1 g/dL (ref 3.5–5.2)
Alkaline Phosphatase: 45 U/L (ref 39–117)
BUN: 13 mg/dL (ref 6–23)
CO2: 29 meq/L (ref 19–32)
Calcium: 9.2 mg/dL (ref 8.4–10.5)
Chloride: 100 meq/L (ref 96–112)
Creatinine, Ser: 0.6 mg/dL (ref 0.40–1.20)
GFR: 105.52 mL/min
Glucose, Bld: 173 mg/dL — ABNORMAL HIGH (ref 70–99)
Potassium: 3.9 meq/L (ref 3.5–5.1)
Sodium: 138 meq/L (ref 135–145)
Total Bilirubin: 0.4 mg/dL (ref 0.2–1.2)
Total Protein: 6.5 g/dL (ref 6.0–8.3)

## 2017-02-08 LAB — LIPID PANEL
CHOL/HDL RATIO: 3
CHOLESTEROL: 168 mg/dL (ref 0–200)
HDL: 61.5 mg/dL (ref >39.00)
LDL Cholesterol: 84 mg/dL (ref 0–99)
NonHDL: 106.52
TRIGLYCERIDES: 111 mg/dL (ref 0.0–149.0)
VLDL: 22.2 mg/dL (ref 0.0–40.0)

## 2017-02-08 LAB — CBC
HCT: 42.3 % (ref 36.0–46.0)
Hemoglobin: 13.8 g/dL (ref 12.0–15.0)
MCHC: 32.7 g/dL (ref 30.0–36.0)
MCV: 92.5 fl (ref 78.0–100.0)
Platelets: 214 K/uL (ref 150.0–400.0)
RBC: 4.57 Mil/uL (ref 3.87–5.11)
RDW: 13.1 % (ref 11.5–15.5)
WBC: 6.9 K/uL (ref 4.0–10.5)

## 2017-02-08 LAB — TSH: TSH: 0.79 u[IU]/mL (ref 0.35–4.50)

## 2017-02-12 ENCOUNTER — Encounter: Payer: Self-pay | Admitting: Family Medicine

## 2017-02-12 ENCOUNTER — Ambulatory Visit (INDEPENDENT_AMBULATORY_CARE_PROVIDER_SITE_OTHER): Payer: Medicare Other | Admitting: Family Medicine

## 2017-02-12 VITALS — BP 118/68 | HR 75 | Temp 98.3°F | Resp 18 | Wt 207.2 lb

## 2017-02-12 DIAGNOSIS — J440 Chronic obstructive pulmonary disease with acute lower respiratory infection: Secondary | ICD-10-CM | POA: Diagnosis not present

## 2017-02-12 DIAGNOSIS — I10 Essential (primary) hypertension: Secondary | ICD-10-CM | POA: Diagnosis not present

## 2017-02-12 DIAGNOSIS — R000 Tachycardia, unspecified: Secondary | ICD-10-CM

## 2017-02-12 DIAGNOSIS — E119 Type 2 diabetes mellitus without complications: Secondary | ICD-10-CM

## 2017-02-12 DIAGNOSIS — E669 Obesity, unspecified: Secondary | ICD-10-CM

## 2017-02-12 DIAGNOSIS — E782 Mixed hyperlipidemia: Secondary | ICD-10-CM

## 2017-02-12 DIAGNOSIS — E1169 Type 2 diabetes mellitus with other specified complication: Secondary | ICD-10-CM | POA: Diagnosis not present

## 2017-02-12 DIAGNOSIS — J209 Acute bronchitis, unspecified: Secondary | ICD-10-CM | POA: Diagnosis not present

## 2017-02-12 DIAGNOSIS — Z23 Encounter for immunization: Secondary | ICD-10-CM | POA: Diagnosis not present

## 2017-02-12 MED ORDER — DOXYCYCLINE HYCLATE 100 MG PO TABS: 100.0000 mg | ORAL_TABLET | Freq: Two times a day (BID) | ORAL | 0 refills | Status: DC

## 2017-02-12 MED ORDER — METHYLPREDNISOLONE 4 MG PO TABS: ORAL_TABLET | ORAL | 0 refills | Status: DC

## 2017-02-12 NOTE — Patient Instructions (Addendum)
Zyrtec/Cetirizine 10 mg tabs, 1 tab daily Flonase/Fluticasone nasal spray, 2 sprays each nostril daily  Shingrix is the new shingles call insurance and confirm where they will pay for it at.  Carbohydrate Counting for Diabetes Mellitus, Adult Carbohydrate counting is a method for keeping track of how many carbohydrates you eat. Eating carbohydrates naturally increases the amount of sugar (glucose) in the blood. Counting how many carbohydrates you eat helps keep your blood glucose within normal limits, which helps you manage your diabetes (diabetes mellitus). It is important to know how many carbohydrates you can safely have in each meal. This is different for every person. A diet and nutrition specialist (registered dietitian) can help you make a meal plan and calculate how many carbohydrates you should have at each meal and snack. Carbohydrates are found in the following foods:  Grains, such as breads and cereals.  Dried beans and soy products.  Starchy vegetables, such as potatoes, peas, and corn.  Fruit and fruit juices.  Milk and yogurt.  Sweets and snack foods, such as cake, cookies, candy, chips, and soft drinks.  How do I count carbohydrates? There are two ways to count carbohydrates in food. You can use either of the methods or a combination of both. Reading "Nutrition Facts" on packaged food The "Nutrition Facts" list is included on the labels of almost all packaged foods and beverages in the [place] It includes:  The serving size.  Information about nutrients in each serving, including the grams (g) of carbohydrate per serving.  To use the "Nutrition Facts":  Decide how many servings you will have.  Multiply the number of servings by the number of carbohydrates per serving.  The resulting number is the total amount of carbohydrates that you will be having.  Learning standard serving sizes of other foods When you eat foods containing carbohydrates that are not packaged  or do not include "Nutrition Facts" on the label, you need to measure the servings in order to count the amount of carbohydrates:  Measure the foods that you will eat with a food scale or measuring cup, if needed.  Decide how many standard-size servings you will eat.  Multiply the number of servings by 15. Most carbohydrate-rich foods have about 15 g of carbohydrates per serving. ? For example, if you eat 8 oz (170 g) of strawberries, you will have eaten 2 servings and 30 g of carbohydrates (2 servings x 15 g = 30 g).  For foods that have more than one food mixed, such as soups and casseroles, you must count the carbohydrates in each food that is included.  The following list contains standard serving sizes of common carbohydrate-rich foods. Each of these servings has about 15 g of carbohydrates:   hamburger bun or  English muffin.   oz (15 mL) syrup.   oz (14 g) jelly.  1 slice of bread.  1 six-inch tortilla.  3 oz (85 g) cooked rice or pasta.  4 oz (113 g) cooked dried beans.  4 oz (113 g) starchy vegetable, such as peas, corn, or potatoes.  4 oz (113 g) hot cereal.  4 oz (113 g) mashed potatoes or  of a large baked potato.  4 oz (113 g) canned or frozen fruit.  4 oz (120 mL) fruit juice.  4-6 crackers.  6 chicken nuggets.  6 oz (170 g) unsweetened dry cereal.  6 oz (170 g) plain fat-free yogurt or yogurt sweetened with artificial sweeteners.  8 oz (240 mL) milk.  8 oz (170 g) fresh fruit or one small piece of fruit.  24 oz (680 g) popped popcorn.  Example of carbohydrate counting Sample meal  3 oz (85 g) chicken breast.  6 oz (170 g) brown rice.  4 oz (113 g) corn.  8 oz (240 mL) milk.  8 oz (170 g) strawberries with sugar-free whipped topping. Carbohydrate calculation 1. Identify the foods that contain carbohydrates: ? Rice. ? Corn. ? Milk. ? Strawberries. 2. Calculate how many servings you have of each food: ? 2 servings rice. ? 1  serving corn. ? 1 serving milk. ? 1 serving strawberries. 3. Multiply each number of servings by 15 g: ? 2 servings rice x 15 g = 30 g. ? 1 serving corn x 15 g = 15 g. ? 1 serving milk x 15 g = 15 g. ? 1 serving strawberries x 15 g = 15 g. 4. Add together all of the amounts to find the total grams of carbohydrates eaten: ? 30 g + 15 g + 15 g + 15 g = 75 g of carbohydrates total. This information is not intended to replace advice given to you by your health care provider. Make sure you discuss any questions you have with your health care provider. Document Released: 03/02/2005 Document Revised: 09/20/2015 Document Reviewed: 08/14/2015 Elsevier Interactive Patient Education  [employer].

## 2017-02-12 NOTE — Assessment & Plan Note (Signed)
Well controlled, no changes to meds. Encouraged heart healthy diet such as the DASH diet and exercise as tolerated.  °

## 2017-02-12 NOTE — Progress Notes (Signed)
Subjective:  I acted as a Education administrator for Dr. Charlett Blake. Savannah Stanley, [place]  Patient ID: Savannah Stanley, female    DOB: May 21, 1950, 66 y.o.   MRN: [id number]  Chief Complaint  Patient presents with  . Follow-up    HPI  Patient is in today for a follow up. She is feeling well.  She has not had any recent febrile illness or hospitalizations.  She reports her blood sugars have been well controlled.  No polyuria or polydipsia.  She is trying to maintain a heart healthy diet and minimize her carbohydrate.  Has been active.  Continues to follow with urology. Denies CP/palp/SOB/HA/congestion/fevers/GI or GU c/o. Taking meds as prescribed  Patient Care Team: [name], MD as PCP - General (Family Medicine) Franchot Gallo, MD as Consulting Physician (Urology)   Past Medical History:  Diagnosis Date  . Acute bronchitis with COPD ([place]) 03/19/2015  . Bladder cancer (HCC) UROLOGIST-- DR Diona Fanti   HIGH-GRADE UROTHELIAL CARCINOMA  . Bladder tumor "multiple"   "some ORs; some removed in office"  . COPD (chronic obstructive pulmonary disease) ([place])   . Depression   . Diabetes mellitus type 2 in obese ([place]) 01/31/2015  . GERD (gastroesophageal reflux disease)   . H/O hiatal hernia   . High cholesterol   . History of blood transfusion 01/2013 - 02/2013   related to ORs  . Hypertension   . Iron deficiency anemia   . Murmur, heart 05/20/2014  . Pneumonia 07/2010; 08/07/2015  . Type 2 diabetes mellitus ([employer])     Past Surgical History:  Procedure Laterality Date  . APPENDECTOMY  1980's  . CYSTOSCOPY N/A 12/18/2013   Procedure: CYSTOSCOPY;  Surgeon: Jorja Loa, MD;  Location: [facility];  Service: Urology;  Laterality: N/A;  . TRANSURETHRAL RESECTION OF BLADDER TUMOR N/A 02/16/2013   Procedure: TRANSURETHRAL RESECTION OF BLADDER TUMOR (TURBT);  Surgeon: Franchot Gallo, MD;  [facility];  Service: Urology;  Laterality: N/A;  . TRANSURETHRAL RESECTION  OF BLADDER TUMOR N/A 12/18/2013   Procedure: TRANSURETHRAL RESECTION OF BLADDER TUMOR (TURBT) ;  Surgeon: Jorja Loa, MD;  Location[facility];  Service: Urology;  Laterality: N/A;  . TRANSURETHRAL RESECTION OF BLADDER TUMOR WITH GYRUS (TURBT-GYRUS) N/A 01/23/2013   Procedure: TRANSURETHRAL RESECTION OF BLADDER TUMOR WITH GYRUS ;  Surgeon: Franchot Gallo, MD;  Location[facility];  Service: Urology;  Laterality: N/A;  . TUBAL LIGATION  1980's    Family History  Problem Relation Age of Onset  . Cancer Father        prostate  . Arthritis Brother     Social History   Socioeconomic History  . Marital status: Widowed    Spouse name: Not on file  . Number of children: Not on file  . Years of education: Not on file  . Highest education level: Not on file  Social Needs  . Financial resource strain: Not on file  . Food insecurity - worry: Not on file  . Food insecurity - inability: Not on file  . Transportation needs - medical: Not on file  . Transportation needs - non-medical: Not on file  Occupational History  . Not on file  Tobacco Use  . Smoking status: Former Smoker    Packs/day: 1.00    Years: 35.00    Pack years: 35.00    Types: Cigarettes    Last attempt to quit: 07/15/2010    Years since quitting: 6.5  . Smokeless tobacco:  Never Used  Substance and Sexual Activity  . Alcohol use: No  . Drug use: No  . Sexual activity: Not on file  Other Topics Concern  . Not on file  Social History Narrative  . Not on file    Outpatient Medications Prior to Visit  Medication Sig Dispense Refill  . albuterol (PROVENTIL HFA;VENTOLIN HFA) 108 (90 Base) MCG/ACT inhaler Inhale 2 puffs into the lungs every 6 (six) hours as needed for wheezing or shortness of breath. Please dispense ProAir 3 Inhaler 3  . Calcium Citrate-Vitamin D (CITRACAL + D PO) Take 2 tablets by mouth 2 (two) times daily.     . citalopram (CELEXA) 40 MG tablet TAKE ONE TABLET BY MOUTH ONCE DAILY  90 tablet 2  . Coenzyme Q10 (COQ10) 200 MG CAPS Take 1 capsule by mouth daily.    . ferrous sulfate (FEOSOL) 325 (65 FE) MG tablet Take 325 mg by mouth daily with breakfast.     . guaiFENesin (MUCINEX) 600 MG 12 hr tablet Take 1 tablet (600 mg total) by mouth 2 (two) times daily. (Patient taking differently: Take 600 mg by mouth 2 (two) times daily as needed. ) 20 tablet 0  . losartan (COZAAR) 50 MG tablet TAKE 1 TABLET BY MOUTH  DAILY 90 tablet 1  . MELATONIN PO Take 1 tablet by mouth at bedtime.    . metFORMIN (GLUCOPHAGE) 500 MG tablet TAKE 1 TABLET BY MOUTH TWO  TIMES DAILY WITH A MEAL 180 tablet 3  . metoprolol succinate (TOPROL-XL) 25 MG 24 hr tablet Take 1 tablet (25 mg total) by mouth daily. 90 tablet 3  . Multiple Vitamins-Minerals (ZINC PO) Take 1 tablet by mouth daily.    Marland Kitchen omeprazole (PRILOSEC) 20 MG capsule TAKE 1 CAPSULE BY MOUTH TWO TIMES DAILY AS NEEDED 180 capsule 0  . pravastatin (PRAVACHOL) 10 MG tablet Take 1 tablet (10 mg total) by mouth daily. 90 tablet 0  . pravastatin (PRAVACHOL) 10 MG tablet TAKE 1 TABLET BY MOUTH  DAILY 90 tablet 0  . Probiotic Product (PROBIOTIC PO) Take 1 capsule by mouth daily.    . SYMBICORT 160-4.5 MCG/ACT inhaler USE 2 PUFFS TWO TIMES DAILY 30.6 g 4  . methylPREDNISolone (MEDROL) 4 MG tablet 5 tab po qd X 1d then 4 tab po qd X 1d then 3 tab po qd X 1d then 2 tab po qd then 1 tab po qd 15 tablet 0  . EPINEPHrine 0.3 mg/0.3 mL IJ SOAJ injection Inject 0.3 mLs (0.3 mg total) into the muscle once. (Patient not taking: Reported on 02/12/2017) 1 Device 1   Facility-Administered Medications Prior to Visit  Medication Dose Route Frequency Provider Last Rate Last Dose  . mitoMYcin (MUTAMYCIN) chemo injection 40 mg  40 mg Bladder Instillation Once Franchot Gallo, MD        Allergies  Allergen Reactions  . Bee Venom Shortness Of Breath  . Other Shortness Of Breath    MSG.  . Latex Itching    Review of Systems  Constitutional: Negative for fever  and malaise/fatigue.  HENT: Negative for congestion.   Eyes: Negative for blurred vision.  Respiratory: Negative for shortness of breath.   Cardiovascular: Negative for chest pain, palpitations and leg swelling.  Gastrointestinal: Negative for abdominal pain, blood in stool and nausea.  Genitourinary: Negative for dysuria and frequency.  Musculoskeletal: Negative for falls.  Skin: Negative for rash.  Neurological: Negative for dizziness, loss of consciousness and headaches.  Endo/Heme/Allergies: Negative for environmental allergies.  Psychiatric/Behavioral: Negative for depression. The patient is not nervous/anxious.        Objective:    Physical Exam  Constitutional: She is oriented to person, place, and time. She appears well-developed and well-nourished. No distress.  HENT:  Head: Normocephalic and atraumatic.  Nose: Nose normal.  Eyes: Right eye exhibits no discharge. Left eye exhibits no discharge.  Neck: Normal range of motion. Neck supple.  Cardiovascular: Normal rate and regular rhythm.  No murmur heard. Pulmonary/Chest: Effort normal and breath sounds normal.  Abdominal: Soft. Bowel sounds are normal. There is no tenderness.  Musculoskeletal: She exhibits no edema.  Neurological: She is alert and oriented to person, place, and time.  Skin: Skin is warm and dry.  Psychiatric: She has a normal mood and affect.  Nursing note and vitals reviewed.  BP 118/68 (BP Location: Left Arm, Patient Position: Sitting, Cuff Size: Normal)   Pulse 75   Temp 98.3 F (36.8 C) (Oral)   Resp 18   Wt 207 lb 3.2 oz (94 kg)   SpO2 94%   BMI 37.90 kg/m  Wt Readings from Last 3 Encounters:  02/12/17 207 lb 3.2 oz (94 kg)  01/12/17 207 lb 3.2 oz (94 kg)  08/20/16 211 lb 6.4 oz (95.9 kg)   BP Readings from Last 3 Encounters:  02/12/17 118/68  01/12/17 130/80  08/20/16 120/78     Immunization History  Administered Date(s) Administered  . Influenza Whole 11/24/2010  . Influenza,  Seasonal, Injecte, Preservative Fre 12/28/2014  . Influenza,inj,Quad PF,6+ Mos 11/17/2013  . Influenza-Unspecified 11/17/2012, 11/29/2015  . Pneumococcal Conjugate-13 11/17/2013  . Pneumococcal Polysaccharide-23 11/09/2011, 02/12/2017  . Tdap 11/24/2010  . Zoster 03/17/2007    Health Maintenance  Topic Date Due  . FOOT EXAM  08/30/1958  . OPHTHALMOLOGY EXAM  08/30/1958  . MAMMOGRAM  11/23/2012  . DEXA SCAN  08/29/2013  . HEMOGLOBIN A1C  08/08/2017  . COLONOSCOPY  11/23/2020  . TETANUS/TDAP  11/23/2020  . INFLUENZA VACCINE  Completed  . Hepatitis C Screening  Completed  . PNA vac Low Risk Adult  Completed    Lab Results  Component Value Date   WBC 6.9 02/08/2017   HGB 13.8 02/08/2017   HCT 42.3 02/08/2017   PLT 214.0 02/08/2017   GLUCOSE 173 (H) 02/08/2017   CHOL 168 02/08/2017   TRIG 111.0 02/08/2017   HDL 61.50 02/08/2017   LDLCALC 84 02/08/2017   ALT 14 02/08/2017   AST 16 02/08/2017   NA 138 02/08/2017   K 3.9 02/08/2017   CL 100 02/08/2017   CREATININE 0.60 02/08/2017   BUN 13 02/08/2017   CO2 29 02/08/2017   TSH 0.79 02/08/2017   INR 1.04 01/18/2013   HGBA1C 7.0 (H) 02/08/2017   MICROALBUR <0.7 07/15/2015    Lab Results  Component Value Date   TSH 0.79 02/08/2017   Lab Results  Component Value Date   WBC 6.9 02/08/2017   HGB 13.8 02/08/2017   HCT 42.3 02/08/2017   MCV 92.5 02/08/2017   PLT 214.0 02/08/2017   Lab Results  Component Value Date   NA 138 02/08/2017   K 3.9 02/08/2017   CO2 29 02/08/2017   GLUCOSE 173 (H) 02/08/2017   BUN 13 02/08/2017   CREATININE 0.60 02/08/2017   BILITOT 0.4 02/08/2017   ALKPHOS 45 02/08/2017   AST 16 02/08/2017   ALT 14 02/08/2017   PROT 6.5 02/08/2017   ALBUMIN 4.1 02/08/2017   CALCIUM 9.2 02/08/2017   ANIONGAP 9 08/09/2015  GFR 105.52 02/08/2017   Lab Results  Component Value Date   CHOL 168 02/08/2017   Lab Results  Component Value Date   HDL 61.50 02/08/2017   Lab Results  Component Value  Date   LDLCALC 84 02/08/2017   Lab Results  Component Value Date   TRIG 111.0 02/08/2017   Lab Results  Component Value Date   CHOLHDL 3 02/08/2017   Lab Results  Component Value Date   HGBA1C 7.0 (H) 02/08/2017         Assessment & Plan:   Problem List Items Addressed This Visit    Hypertension    Well controlled, no changes to meds. Encouraged heart healthy diet such as the DASH diet and exercise as tolerated.       Relevant Orders   CBC   Comprehensive metabolic panel   TSH   Hyperlipidemia    Tolerating statin, encouraged heart healthy diet, avoid trans fats, minimize simple carbs and saturated fats. Increase exercise as tolerated      Relevant Orders   Lipid panel   Tachycardia    RRR today      Diabetes mellitus type 2 in obese (HCC)    hgba1c acceptable, minimize simple carbs. Increase exercise as tolerated. Continue current meds      Relevant Orders   Hemoglobin A1c   Acute bronchitis with COPD ([place])    Refill given of Medrol and antibiotic to use only if symptoms worsen again. She got notably better but has now worsened some again      Relevant Medications   methylPREDNISolone (MEDROL) 4 MG tablet    Other Visit Diagnoses    Need for pneumococcal vaccine    -  Primary   Relevant Orders   Pneumococcal polysaccharide vaccine 23-valent greater than or equal to 2yo subcutaneous/IM (Completed)      I am having Savannah Stanley "Savannah Stanley" start on doxycycline. I am also having her maintain her ferrous sulfate, Calcium Citrate-Vitamin D (CITRACAL + D PO), Multiple Vitamins-Minerals (ZINC PO), CoQ10, EPINEPHrine, guaiFENesin, metoprolol succinate, SYMBICORT, albuterol, Probiotic Product (PROBIOTIC PO), MELATONIN PO, losartan, pravastatin, citalopram, omeprazole, pravastatin, metFORMIN, and methylPREDNISolone.  Meds ordered this encounter  Medications  . methylPREDNISolone (MEDROL) 4 MG tablet    Sig: 5 tab po qd X 1d then 4 tab po qd X 1d then 3 tab po qd  X 1d then 2 tab po qd then 1 tab po qd    Dispense:  15 tablet    Refill:  0  . doxycycline (VIBRA-TABS) 100 MG tablet    Sig: Take 1 tablet (100 mg total) by mouth 2 (two) times daily.    Dispense:  20 tablet    Refill:  0    CMA served as scribe during this visit. History, Physical and Plan performed by medical provider. Documentation and orders reviewed and attested to.  Penni Homans, MD

## 2017-02-12 NOTE — Assessment & Plan Note (Signed)
RRR today 

## 2017-02-12 NOTE — Assessment & Plan Note (Signed)
Refill given of Medrol and antibiotic to use only if symptoms worsen again. She got notably better but has now worsened some again

## 2017-02-12 NOTE — Assessment & Plan Note (Signed)
Tolerating statin, encouraged heart healthy diet, avoid trans fats, minimize simple carbs and saturated fats. Increase exercise as tolerated 

## 2017-02-12 NOTE — Assessment & Plan Note (Signed)
hgba1c acceptable, minimize simple carbs. Increase exercise as tolerated. Continue current meds 

## 2017-03-16 ENCOUNTER — Other Ambulatory Visit: Payer: Self-pay | Admitting: Family Medicine

## 2017-03-25 ENCOUNTER — Other Ambulatory Visit: Payer: Self-pay | Admitting: Family Medicine

## 2017-04-28 ENCOUNTER — Other Ambulatory Visit: Payer: Self-pay | Admitting: Family Medicine

## 2017-05-24 DIAGNOSIS — C678 Malignant neoplasm of overlapping sites of bladder: Secondary | ICD-10-CM | POA: Diagnosis not present

## 2017-06-07 ENCOUNTER — Other Ambulatory Visit (INDEPENDENT_AMBULATORY_CARE_PROVIDER_SITE_OTHER): Payer: Medicare Other

## 2017-06-07 DIAGNOSIS — E669 Obesity, unspecified: Secondary | ICD-10-CM | POA: Diagnosis not present

## 2017-06-07 DIAGNOSIS — E1169 Type 2 diabetes mellitus with other specified complication: Secondary | ICD-10-CM | POA: Diagnosis not present

## 2017-06-07 DIAGNOSIS — I10 Essential (primary) hypertension: Secondary | ICD-10-CM

## 2017-06-07 DIAGNOSIS — E782 Mixed hyperlipidemia: Secondary | ICD-10-CM | POA: Diagnosis not present

## 2017-06-07 LAB — LIPID PANEL
Cholesterol: 161 mg/dL (ref 0–200)
HDL: 66.2 mg/dL (ref >39.00)
LDL CALC: 66 mg/dL (ref 0–99)
NonHDL: 94.6
TRIGLYCERIDES: 142 mg/dL (ref 0.0–149.0)
Total CHOL/HDL Ratio: 2
VLDL: 28.4 mg/dL (ref 0.0–40.0)

## 2017-06-07 LAB — COMPREHENSIVE METABOLIC PANEL
ALBUMIN: 3.9 g/dL (ref 3.5–5.2)
ALT: 12 U/L (ref 0–35)
AST: 14 U/L (ref 0–37)
Alkaline Phosphatase: 49 U/L (ref 39–117)
BUN: 18 mg/dL (ref 6–23)
CALCIUM: 9.6 mg/dL (ref 8.4–10.5)
CHLORIDE: 99 meq/L (ref 96–112)
CO2: 30 mEq/L (ref 19–32)
CREATININE: 0.61 mg/dL (ref 0.40–1.20)
GFR: 103.43 mL/min (ref >60.00)
Glucose, Bld: 132 mg/dL — ABNORMAL HIGH (ref 70–99)
Potassium: 4.1 mEq/L (ref 3.5–5.1)
Sodium: 138 mEq/L (ref 135–145)
Total Bilirubin: 0.5 mg/dL (ref 0.2–1.2)
Total Protein: 6.8 g/dL (ref 6.0–8.3)

## 2017-06-07 LAB — CBC
HEMATOCRIT: 43 % (ref 36.0–46.0)
Hemoglobin: 14.3 g/dL (ref 12.0–15.0)
MCHC: 33.3 g/dL (ref 30.0–36.0)
MCV: 91.3 fl (ref 78.0–100.0)
Platelets: 221 10*3/uL (ref 150.0–400.0)
RBC: 4.7 Mil/uL (ref 3.87–5.11)
RDW: 13.1 % (ref 11.5–15.5)
WBC: 7.2 10*3/uL (ref 4.0–10.5)

## 2017-06-07 LAB — TSH: TSH: 1.31 u[IU]/mL (ref 0.35–4.50)

## 2017-06-07 LAB — HEMOGLOBIN A1C: HEMOGLOBIN A1C: 7.1 % — AB (ref 4.6–6.5)

## 2017-06-14 ENCOUNTER — Telehealth: Payer: Self-pay | Admitting: Family Medicine

## 2017-06-14 ENCOUNTER — Other Ambulatory Visit: Payer: Self-pay

## 2017-06-14 ENCOUNTER — Ambulatory Visit: Payer: Medicare Other | Admitting: Family Medicine

## 2017-06-14 DIAGNOSIS — R062 Wheezing: Secondary | ICD-10-CM

## 2017-06-14 MED ORDER — ALBUTEROL SULFATE HFA 108 (90 BASE) MCG/ACT IN AERS: 2.0000 | INHALATION_SPRAY | Freq: Four times a day (QID) | RESPIRATORY_TRACT | 3 refills | Status: DC | PRN

## 2017-06-14 MED ORDER — BUDESONIDE-FORMOTEROL FUMARATE 160-4.5 MCG/ACT IN AERO: INHALATION_SPRAY | RESPIRATORY_TRACT | 4 refills | Status: DC

## 2017-06-14 NOTE — Addendum Note (Signed)
Addended by: Magdalene Molly A on: 06/14/2017 10:15 AM   Modules accepted: Orders

## 2017-06-14 NOTE — Progress Notes (Signed)
Patient not seen.

## 2017-06-21 ENCOUNTER — Other Ambulatory Visit: Payer: Self-pay

## 2017-06-21 MED ORDER — LOSARTAN POTASSIUM 50 MG PO TABS: 50.0000 mg | ORAL_TABLET | Freq: Every day | ORAL | 0 refills | Status: DC

## 2017-06-23 ENCOUNTER — Telehealth: Payer: Self-pay

## 2017-06-23 ENCOUNTER — Encounter: Payer: Self-pay | Admitting: Family Medicine

## 2017-06-23 NOTE — Telephone Encounter (Signed)
PA approved through 03/15/2018. 

## 2017-06-23 NOTE — Addendum Note (Signed)
Addended byDamita Dunnings D on: 06/23/2017 10:33 AM   Modules accepted: Orders

## 2017-06-23 NOTE — Telephone Encounter (Addendum)
PA initiated via Covermymeds; KEY: [id number]. Awaiting determination.

## 2017-08-18 NOTE — Progress Notes (Addendum)
Subjective:   Savannah Stanley is a 67 y.o. female who presents for Medicare Annual (Subsequent) preventive examination.  Review of Systems: No ROS.  Medicare Wellness Visit. Additional risk factors are reflected in the social history.  Cardiac Risk Factors include: advanced age (>69men, >74 women);diabetes mellitus;dyslipidemia;hypertension Sleep patterns: Takes Melatonin 10 mg po. Sleeps 5-6 hrs.     Home Safety/Smoke Alarms: Feels safe in home. Smoke alarms in place.  Living environment; residence and Firearm Safety: Lives alone in 2 story home.  Walk-in shower.   Female:     Mammo- declined      Dexa scan- declined [facility]- declined Dentist-Dr.Reeves every 6 months.    Objective:     Vitals: BP 138/77 (BP Location: Left Arm, Patient Position: Sitting, Cuff Size: Normal)   Pulse 77   Ht 5\' 3"  (1.6 m)   Wt 207 lb 12.8 oz (94.3 kg)   SpO2 96%   BMI 36.81 kg/m   Body mass index is 36.81 kg/m.  Advanced Directives 08/24/2017 08/07/2015 11/17/2013 02/16/2013 01/23/2013 01/18/2013  Does Patient Have a Medical Advance Directive? Yes No No;Yes Patient has advance directive, copy not in chart - Patient has advance directive, copy not in chart  Type of Advance Directive [place];Living will - [place];Living will - - [place];Living will  Does patient want to make changes to medical advance directive? - - - - - No  Copy of [place] in Chart? No - copy requested - No - copy requested - - -  Would patient like information on creating a medical advance directive? - Yes - Educational materials given No - patient declined information - - -  Pre-existing out of facility DNR order (yellow form or pink MOST form) - - - - No -    Tobacco Social History   Tobacco Use  Smoking Status Former Smoker  . Packs/day: 1.00  . Years: 35.00  . Pack years: 35.00  . Types: Cigarettes  . Last attempt to quit: 07/15/2010  . Years  since quitting: 7.1  Smokeless Tobacco Never Used     Counseling given: Not Answered   Clinical Intake: Pain : No/denies pain     Past Medical History:  Diagnosis Date  . Acute bronchitis with COPD ([place]) 03/19/2015  . Bladder cancer (HCC) UROLOGIST-- DR Diona Fanti   HIGH-GRADE UROTHELIAL CARCINOMA  . Bladder tumor "multiple"   "some ORs; some removed in office"  . COPD (chronic obstructive pulmonary disease) ([place])   . Depression   . Diabetes mellitus type 2 in obese ([place]) 01/31/2015  . GERD (gastroesophageal reflux disease)   . H/O hiatal hernia   . High cholesterol   . History of blood transfusion 01/2013 - 02/2013   related to ORs  . Hypertension   . Iron deficiency anemia   . Murmur, heart 05/20/2014  . Pneumonia 07/2010; 08/07/2015  . Type 2 diabetes mellitus ([place])    Past Surgical History:  Procedure Laterality Date  . APPENDECTOMY  1980's  . CYSTOSCOPY N/A 12/18/2013   Procedure: CYSTOSCOPY;  Surgeon: Jorja Loa, MD;  Location: [facility];  Service: Urology;  Laterality: N/A;  . TRANSURETHRAL RESECTION OF BLADDER TUMOR N/A 02/16/2013   Procedure: TRANSURETHRAL RESECTION OF BLADDER TUMOR (TURBT);  Surgeon: Franchot Gallo, MD;  [facility];  Service: Urology;  Laterality: N/A;  . TRANSURETHRAL RESECTION OF BLADDER TUMOR N/A 12/18/2013   Procedure: TRANSURETHRAL RESECTION OF BLADDER  TUMOR (TURBT) ;  Surgeon: Jorja Loa, MD;  Location[facility];  Service: Urology;  Laterality: N/A;  . TRANSURETHRAL RESECTION OF BLADDER TUMOR WITH GYRUS (TURBT-GYRUS) N/A 01/23/2013   Procedure: TRANSURETHRAL RESECTION OF BLADDER TUMOR WITH GYRUS ;  Surgeon: Franchot Gallo, MD;  [facility];  Service: Urology;  Laterality: N/A;  . TUBAL LIGATION  1980's   Family History  Problem Relation Age of Onset  . Cancer Father        prostate  . Arthritis Brother    Social History   Socioeconomic History  .  Marital status: Widowed    Spouse name: Not on file  . Number of children: Not on file  . Years of education: Not on file  . Highest education level: Not on file  Occupational History  . Not on file  Social Needs  . Financial resource strain: Not on file  . Food insecurity:    Worry: Not on file    Inability: Not on file  . Transportation needs:    Medical: Not on file    Non-medical: Not on file  Tobacco Use  . Smoking status: Former Smoker    Packs/day: 1.00    Years: 35.00    Pack years: 35.00    Types: Cigarettes    Last attempt to quit: 07/15/2010    Years since quitting: 7.1  . Smokeless tobacco: Never Used  Substance and Sexual Activity  . Alcohol use: Yes    Comment: wine socially   . Drug use: No  . Sexual activity: Not on file  Lifestyle  . Physical activity:    Days per week: Not on file    Minutes per session: Not on file  . Stress: Not on file  Relationships  . Social connections:    Talks on phone: Not on file    Gets together: Not on file    Attends religious service: Not on file    Active member of club or organization: Not on file    Attends meetings of clubs or organizations: Not on file    Relationship status: Not on file  Other Topics Concern  . Not on file  Social History Narrative  . Not on file    Outpatient Encounter Medications as of 08/24/2017  Medication Sig  . albuterol (PROAIR HFA) 108 (90 Base) MCG/ACT inhaler Inhale 2 puffs into the lungs every 6 (six) hours as needed for wheezing or shortness of breath.  . budesonide-formoterol (SYMBICORT) 160-4.5 MCG/ACT inhaler USE 2 PUFFS TWO TIMES DAILY  . Calcium Citrate-Vitamin D (CITRACAL + D PO) Take 2 tablets by mouth 2 (two) times daily.   . citalopram (CELEXA) 40 MG tablet TAKE ONE TABLET BY MOUTH ONCE DAILY  . Coenzyme Q10 (COQ10) 200 MG CAPS Take 1 capsule by mouth daily.  Marland Kitchen losartan (COZAAR) 50 MG tablet Take 1 tablet (50 mg total) by mouth daily.  Marland Kitchen MELATONIN PO Take 1 tablet by mouth  at bedtime.  . metFORMIN (GLUCOPHAGE) 500 MG tablet TAKE 1 TABLET BY MOUTH TWO  TIMES DAILY WITH A MEAL  . metoprolol succinate (TOPROL-XL) 25 MG 24 hr tablet TAKE 1 TABLET BY MOUTH  DAILY  . Multiple Vitamins-Minerals (ZINC PO) Take 1 tablet by mouth daily.  Marland Kitchen omeprazole (PRILOSEC) 20 MG capsule TAKE 1 CAPSULE BY MOUTH TWO TIMES DAILY AS NEEDED  . pravastatin (PRAVACHOL) 10 MG tablet TAKE 1 TABLET BY MOUTH  DAILY  . Probiotic Product (PROBIOTIC PO) Take 1  capsule by mouth daily.  Marland Kitchen EPINEPHrine 0.3 mg/0.3 mL IJ SOAJ injection Inject 0.3 mLs (0.3 mg total) into the muscle once. (Patient not taking: Reported on 02/12/2017)  . ferrous sulfate (FEOSOL) 325 (65 FE) MG tablet Take 325 mg by mouth daily with breakfast.   . guaiFENesin (MUCINEX) 600 MG 12 hr tablet Take 1 tablet (600 mg total) by mouth 2 (two) times daily. (Patient not taking: Reported on 08/24/2017)  . [DISCONTINUED] doxycycline (VIBRA-TABS) 100 MG tablet Take 1 tablet (100 mg total) by mouth 2 (two) times daily.  . [DISCONTINUED] methylPREDNISolone (MEDROL) 4 MG tablet 5 tab po qd X 1d then 4 tab po qd X 1d then 3 tab po qd X 1d then 2 tab po qd then 1 tab po qd   Facility-Administered Encounter Medications as of 08/24/2017  Medication  . mitoMYcin (MUTAMYCIN) chemo injection 40 mg    Activities of Daily Living In your present state of health, do you have any difficulty performing the following activities: 08/24/2017  Hearing? N  Vision? N  Difficulty concentrating or making decisions? N  Walking or climbing stairs? N  Dressing or bathing? N  Doing errands, shopping? N  Preparing Food and eating ? N  Using the Toilet? N  In the past six months, have you accidently leaked urine? N  Do you have problems with loss of bowel control? N  Managing your Medications? N  Managing your Finances? N  Housekeeping or managing your Housekeeping? N  Some recent data might be hidden    Patient Care Team: [name], MD as PCP -  General (Family Medicine) Franchot Gallo, MD as Consulting Physician (Urology)    Assessment:   This is a routine wellness examination for [facility]. Physical assessment deferred to PCP.  Exercise Activities and Dietary recommendations Current Exercise Habits: The patient does not participate in regular exercise at present, Exercise limited by: None identified Diet (meal preparation, eat out, water intake, caffeinated beverages, dairy products, fruits and vegetables): well balanced, on average, 3 meals per day    Goals    . Increase physical activity     Go to the pool 2x/ week.       Fall Risk Fall Risk  08/24/2017 10/09/2016 08/20/2016 07/15/2015 11/17/2013  Falls in the past year? No No No No No  Comment - Emmi Telephone Survey: data to providers prior to load - - -     Depression Screen PHQ 2/9 Scores 08/24/2017 08/20/2016 07/15/2015 11/17/2013  PHQ - 2 Score 0 0 0 0     Cognitive Function Ad8 score reviewed for issues:  Issues making decisions:no  Less interest in hobbies / activities:no  Repeats questions, stories (family complaining):no  Trouble using ordinary gadgets (microwave, computer, phone):no  Forgets the month or year: no  Mismanaging finances: no  Remembering appts:no  Daily problems with thinking and/or memory:no Ad8 score is=0      MMSE - Mini Mental State Exam 08/20/2016  Orientation to time 5  Orientation to Place 5  Registration 3  Attention/ Calculation 5  Recall 3  Language- name 2 objects 2  Language- repeat 1  Language- follow 3 step command 3  Language- read & follow direction 1  Write a sentence 1  Copy design 1  Total score 30        Immunization History  Administered Date(s) Administered  . Influenza Whole 11/24/2010  . Influenza, Seasonal, Injecte, Preservative Fre 12/28/2014  . Influenza,inj,Quad PF,6+ Mos 11/17/2013  . Influenza-Unspecified  11/17/2012, 11/29/2015  . Pneumococcal Conjugate-13 11/17/2013  . Pneumococcal  Polysaccharide-23 11/09/2011, 02/12/2017  . Tdap 11/24/2010  . Zoster 03/17/2007   Screening Tests Health Maintenance  Topic Date Due  . FOOT EXAM  08/30/1958  . OPHTHALMOLOGY EXAM  08/30/1958  . MAMMOGRAM  11/23/2012  . DEXA SCAN  08/29/2013  . INFLUENZA VACCINE  10/14/2017  . HEMOGLOBIN A1C  12/08/2017  . COLONOSCOPY  11/23/2020  . TETANUS/TDAP  11/23/2020  . Hepatitis C Screening  Completed  . PNA vac Low Risk Adult  Completed      Plan:    Please schedule your next medicare wellness visit with me in 1 yr.  Continue to eat heart healthy diet (full of fruits, vegetables, whole grains, lean protein, water--limit salt, fat, and sugar intake) and increase physical activity as tolerated.  Continue doing brain stimulating activities (puzzles, reading, adult coloring books, staying active) to keep memory sharp.   Please schedule diabetic eye exam.   I have personally reviewed and noted the following in the patient's chart:   . Medical and social history . Use of alcohol, tobacco or illicit drugs  . Current medications and supplements . Functional ability and status . Nutritional status . Physical activity . Advanced directives . List of other physicians . Hospitalizations, surgeries, and ER visits in previous 12 months . Vitals . Screenings to include cognitive, depression, and falls . Referrals and appointments  In addition, I have reviewed and discussed with patient certain preventive protocols, quality metrics, and best practice recommendations. A written personalized care plan for preventive services as well as general preventive health recommendations were provided to patient.     Naaman Plummer [place], [place]  08/24/2017  Kathlene November, MD

## 2017-08-24 ENCOUNTER — Encounter: Payer: Self-pay | Admitting: *Deleted

## 2017-08-24 ENCOUNTER — Ambulatory Visit (INDEPENDENT_AMBULATORY_CARE_PROVIDER_SITE_OTHER): Payer: Medicare Other | Admitting: *Deleted

## 2017-08-24 ENCOUNTER — Ambulatory Visit: Payer: Medicare Other | Admitting: *Deleted

## 2017-08-24 VITALS — BP 138/77 | HR 77 | Ht 63.0 in | Wt 207.8 lb

## 2017-08-24 DIAGNOSIS — Z0000 Encounter for general adult medical examination without abnormal findings: Secondary | ICD-10-CM

## 2017-08-24 NOTE — Patient Instructions (Signed)
Please schedule your next medicare wellness visit with me in 1 yr.  Continue to eat heart healthy diet (full of fruits, vegetables, whole grains, lean protein, water--limit salt, fat, and sugar intake) and increase physical activity as tolerated.  Continue doing brain stimulating activities (puzzles, reading, adult coloring books, staying active) to keep memory sharp.   Please schedule diabetic eye exam.   Savannah Stanley , Thank you for taking time to come for your Medicare Wellness Visit. I appreciate your ongoing commitment to your health goals. Please review the following plan we discussed and let me know if I can assist you in the future.   These are the goals we discussed: Goals    . Increase physical activity     Go to the pool 2x/ week.       This is a list of the screening recommended for you and due dates:  Health Maintenance  Topic Date Due  . Complete foot exam   08/30/1958  . Eye exam for diabetics  08/30/1958  . Mammogram  11/23/2012  . DEXA scan (bone density measurement)  08/29/2013  . Flu Shot  10/14/2017  . Hemoglobin A1C  12/08/2017  . Colon Cancer Screening  11/23/2020  . Tetanus Vaccine  11/23/2020  .  Hepatitis C: One time screening is recommended by Center for Disease Control  (CDC) for  adults born from [birth year] through 1967.   Completed  . Pneumonia vaccines  Completed    Health Maintenance for Postmenopausal Women Menopause is a normal process in which your reproductive ability comes to an end. This process happens gradually over a span of months to years, usually between the ages of 64 and 80. Menopause is complete when you have missed 12 consecutive menstrual periods. It is important to talk with your health care provider about some of the most common conditions that affect postmenopausal women, such as heart disease, cancer, and bone loss (osteoporosis). Adopting a healthy lifestyle and getting preventive care can help to promote your health and wellness.  Those actions can also lower your chances of developing some of these common conditions. What should I know about menopause? During menopause, you may experience a number of symptoms, such as:  Moderate-to-severe hot flashes.  Night sweats.  Decrease in sex drive.  Mood swings.  Headaches.  Tiredness.  Irritability.  Memory problems.  Insomnia.  Choosing to treat or not to treat menopausal changes is an individual decision that you make with your health care provider. What should I know about hormone replacement therapy and supplements? Hormone therapy products are effective for treating symptoms that are associated with menopause, such as hot flashes and night sweats. Hormone replacement carries certain risks, especially as you become older. If you are thinking about using estrogen or estrogen with progestin treatments, discuss the benefits and risks with your health care provider. What should I know about heart disease and stroke? Heart disease, heart attack, and stroke become more likely as you age. This may be due, in part, to the hormonal changes that your body experiences during menopause. These can affect how your body processes dietary fats, triglycerides, and cholesterol. Heart attack and stroke are both medical emergencies. There are many things that you can do to help prevent heart disease and stroke:  Have your blood pressure checked at least every 1-2 years. High blood pressure causes heart disease and increases the risk of stroke.  If you are 56-65 years old, ask your health care provider if you should take  aspirin to prevent a heart attack or a stroke.  Do not use any tobacco products, including cigarettes, chewing tobacco, or electronic cigarettes. If you need help quitting, ask your health care provider.  It is important to eat a healthy diet and maintain a healthy weight. ? Be sure to include plenty of vegetables, fruits, low-fat dairy products, and lean  protein. ? Avoid eating foods that are high in solid fats, added sugars, or salt (sodium).  Get regular exercise. This is one of the most important things that you can do for your health. ? Try to exercise for at least 150 minutes each week. The type of exercise that you do should increase your heart rate and make you sweat. This is known as moderate-intensity exercise. ? Try to do strengthening exercises at least twice each week. Do these in addition to the moderate-intensity exercise.  Know your numbers.Ask your health care provider to check your cholesterol and your blood glucose. Continue to have your blood tested as directed by your health care provider.  What should I know about cancer screening? There are several types of cancer. Take the following steps to reduce your risk and to catch any cancer development as early as possible. Breast Cancer  Practice breast self-awareness. ? This means understanding how your breasts normally appear and feel. ? It also means doing regular breast self-exams. Let your health care provider know about any changes, no matter how small.  If you are 73 or older, have a clinician do a breast exam (clinical breast exam or CBE) every year. Depending on your age, family history, and medical history, it may be recommended that you also have a yearly breast X-ray (mammogram).  If you have a family history of breast cancer, talk with your health care provider about genetic screening.  If you are at high risk for breast cancer, talk with your health care provider about having an MRI and a mammogram every year.  Breast cancer (BRCA) gene test is recommended for women who have family members with BRCA-related cancers. Results of the assessment will determine the need for genetic counseling and BRCA1 and for BRCA2 testing. BRCA-related cancers include these types: ? Breast. This occurs in males or females. ? Ovarian. ? Tubal. This may also be called fallopian tube  cancer. ? Cancer of the abdominal or pelvic lining (peritoneal cancer). ? Prostate. ? Pancreatic.  Cervical, Uterine, and Ovarian Cancer Your health care provider may recommend that you be screened regularly for cancer of the pelvic organs. These include your ovaries, uterus, and vagina. This screening involves a pelvic exam, which includes checking for microscopic changes to the surface of your cervix (Pap test).  For women ages 21-65, health care providers may recommend a pelvic exam and a Pap test every three years. For women ages 44-65, they may recommend the Pap test and pelvic exam, combined with testing for human papilloma virus (HPV), every five years. Some types of HPV increase your risk of cervical cancer. Testing for HPV may also be done on women of any age who have unclear Pap test results.  Other health care providers may not recommend any screening for nonpregnant women who are considered low risk for pelvic cancer and have no symptoms. Ask your health care provider if a screening pelvic exam is right for you.  If you have had past treatment for cervical cancer or a condition that could lead to cancer, you need Pap tests and screening for cancer for at least 20  years after your treatment. If Pap tests have been discontinued for you, your risk factors (such as having a new sexual partner) need to be reassessed to determine if you should start having screenings again. Some women have medical problems that increase the chance of getting cervical cancer. In these cases, your health care provider may recommend that you have screening and Pap tests more often.  If you have a family history of uterine cancer or ovarian cancer, talk with your health care provider about genetic screening.  If you have vaginal bleeding after reaching menopause, tell your health care provider.  There are currently no reliable tests available to screen for ovarian cancer.  Lung Cancer Lung cancer screening is  recommended for adults 70-22 years old who are at high risk for lung cancer because of a history of smoking. A yearly low-dose CT scan of the lungs is recommended if you:  Currently smoke.  Have a history of at least 30 pack-years of smoking and you currently smoke or have quit within the past 15 years. A pack-year is smoking an average of one pack of cigarettes per day for one year.  Yearly screening should:  Continue until it has been 15 years since you quit.  Stop if you develop a health problem that would prevent you from having lung cancer treatment.  Colorectal Cancer  This type of cancer can be detected and can often be prevented.  Routine colorectal cancer screening usually begins at age 28 and continues through age 35.  If you have risk factors for colon cancer, your health care provider may recommend that you be screened at an earlier age.  If you have a family history of colorectal cancer, talk with your health care provider about genetic screening.  Your health care provider may also recommend using home test kits to check for hidden blood in your stool.  A small camera at the end of a tube can be used to examine your colon directly (sigmoidoscopy or colonoscopy). This is done to check for the earliest forms of colorectal cancer.  Direct examination of the colon should be repeated every 5-10 years until age 35. However, if early forms of precancerous polyps or small growths are found or if you have a family history or genetic risk for colorectal cancer, you may need to be screened more often.  Skin Cancer  Check your skin from head to toe regularly.  Monitor any moles. Be sure to tell your health care provider: ? About any new moles or changes in moles, especially if there is a change in a mole's shape or color. ? If you have a mole that is larger than the size of a pencil eraser.  If any of your family members has a history of skin cancer, especially at a young age,  talk with your health care provider about genetic screening.  Always use sunscreen. Apply sunscreen liberally and repeatedly throughout the day.  Whenever you are outside, protect yourself by wearing long sleeves, pants, a wide-brimmed hat, and sunglasses.  What should I know about osteoporosis? Osteoporosis is a condition in which bone destruction happens more quickly than new bone creation. After menopause, you may be at an increased risk for osteoporosis. To help prevent osteoporosis or the bone fractures that can happen because of osteoporosis, the following is recommended:  If you are 3-48 years old, get at least 1,000 mg of calcium and at least 600 mg of vitamin D per day.  If you are  older than age 58 but younger than age 56, get at least 1,200 mg of calcium and at least 600 mg of vitamin D per day.  If you are older than age 28, get at least 1,200 mg of calcium and at least 800 mg of vitamin D per day.  Smoking and excessive alcohol intake increase the risk of osteoporosis. Eat foods that are rich in calcium and vitamin D, and do weight-bearing exercises several times each week as directed by your health care provider. What should I know about how menopause affects my mental health? Depression may occur at any age, but it is more common as you become older. Common symptoms of depression include:  Low or sad mood.  Changes in sleep patterns.  Changes in appetite or eating patterns.  Feeling an overall lack of motivation or enjoyment of activities that you previously enjoyed.  Frequent crying spells.  Talk with your health care provider if you think that you are experiencing depression. What should I know about immunizations? It is important that you get and maintain your immunizations. These include:  Tetanus, diphtheria, and pertussis (Tdap) booster vaccine.  Influenza every year before the flu season begins.  Pneumonia vaccine.  Shingles vaccine.  Your health care  provider may also recommend other immunizations. This information is not intended to replace advice given to you by your health care provider. Make sure you discuss any questions you have with your health care provider. Document Released: 04/24/2005 Document Revised: 09/20/2015 Document Reviewed: 12/04/2014 Elsevier Interactive Patient Education  2018 [employer].

## 2017-09-04 ENCOUNTER — Other Ambulatory Visit: Payer: Self-pay | Admitting: Family Medicine

## 2017-09-27 ENCOUNTER — Other Ambulatory Visit: Payer: Self-pay | Admitting: Family Medicine

## 2017-10-01 ENCOUNTER — Ambulatory Visit (INDEPENDENT_AMBULATORY_CARE_PROVIDER_SITE_OTHER): Payer: Medicare Other | Admitting: Family Medicine

## 2017-10-01 VITALS — BP 110/64 | HR 84 | Temp 98.2°F | Resp 18 | Ht 63.0 in | Wt 206.8 lb

## 2017-10-01 DIAGNOSIS — E669 Obesity, unspecified: Secondary | ICD-10-CM | POA: Diagnosis not present

## 2017-10-01 DIAGNOSIS — J441 Chronic obstructive pulmonary disease with (acute) exacerbation: Secondary | ICD-10-CM

## 2017-10-01 DIAGNOSIS — K219 Gastro-esophageal reflux disease without esophagitis: Secondary | ICD-10-CM | POA: Diagnosis not present

## 2017-10-01 DIAGNOSIS — E1169 Type 2 diabetes mellitus with other specified complication: Secondary | ICD-10-CM

## 2017-10-01 DIAGNOSIS — E782 Mixed hyperlipidemia: Secondary | ICD-10-CM

## 2017-10-01 DIAGNOSIS — I10 Essential (primary) hypertension: Secondary | ICD-10-CM

## 2017-10-01 LAB — LIPID PANEL
CHOLESTEROL: 169 mg/dL (ref 0–200)
HDL: 67.9 mg/dL (ref >39.00)
LDL CALC: 78 mg/dL (ref 0–99)
NonHDL: 101.11
TRIGLYCERIDES: 114 mg/dL (ref 0.0–149.0)
Total CHOL/HDL Ratio: 2
VLDL: 22.8 mg/dL (ref 0.0–40.0)

## 2017-10-01 LAB — CBC
HCT: 42.1 % (ref 36.0–46.0)
HEMOGLOBIN: 13.7 g/dL (ref 12.0–15.0)
MCHC: 32.5 g/dL (ref 30.0–36.0)
MCV: 90.6 fl (ref 78.0–100.0)
PLATELETS: 260 10*3/uL (ref 150.0–400.0)
RBC: 4.64 Mil/uL (ref 3.87–5.11)
RDW: 13.2 % (ref 11.5–15.5)
WBC: 7.3 10*3/uL (ref 4.0–10.5)

## 2017-10-01 LAB — COMPREHENSIVE METABOLIC PANEL
ALBUMIN: 4.3 g/dL (ref 3.5–5.2)
ALT: 10 U/L (ref 0–35)
AST: 12 U/L (ref 0–37)
Alkaline Phosphatase: 55 U/L (ref 39–117)
BILIRUBIN TOTAL: 0.4 mg/dL (ref 0.2–1.2)
BUN: 16 mg/dL (ref 6–23)
CALCIUM: 9.5 mg/dL (ref 8.4–10.5)
CHLORIDE: 97 meq/L (ref 96–112)
CO2: 30 meq/L (ref 19–32)
Creatinine, Ser: 0.61 mg/dL (ref 0.40–1.20)
GFR: 103.33 mL/min (ref >60.00)
Glucose, Bld: 142 mg/dL — ABNORMAL HIGH (ref 70–99)
Potassium: 4 mEq/L (ref 3.5–5.1)
Sodium: 136 mEq/L (ref 135–145)
Total Protein: 7.1 g/dL (ref 6.0–8.3)

## 2017-10-01 LAB — HEMOGLOBIN A1C: Hgb A1c MFr Bld: 7 % — ABNORMAL HIGH (ref 4.6–6.5)

## 2017-10-01 LAB — TSH: TSH: 0.83 u[IU]/mL (ref 0.35–4.50)

## 2017-10-01 MED ORDER — FLUTICASONE-SALMETEROL 250-50 MCG/DOSE IN AEPB: 1.0000 | INHALATION_SPRAY | Freq: Two times a day (BID) | RESPIRATORY_TRACT | 1 refills | Status: DC

## 2017-10-01 NOTE — Progress Notes (Signed)
Subjective:  I acted as a Education administrator for Dr. Charlett Blake. [name], [place]  Patient ID: Savannah Stanley, female    DOB: June 16, 1950, 67 y.o.   MRN: [id number]  No chief complaint on file.   HPI  Patient is in today for a 4 month follow up. She is following up on her DM, HTN, hyperlipidemia and other medical concerns. Patient has no acute concerns. No recent febrile illness or acute hospitalizations. Denies CP/palp/SOB/HA/congestion/fevers/GI or GU c/o. Taking meds as prescribed. No polyuria or polydipsia. Is maintaining a heart healthy diet and she stays very active.    Patient Care Team: [name], MD as PCP - General (Family Medicine) Franchot Gallo, MD as Consulting Physician (Urology)   Past Medical History:  Diagnosis Date  . Acute bronchitis with COPD ([place]) 03/19/2015  . Bladder cancer ([facility]) UROLOGIST-- DR Diona Fanti   HIGH-GRADE UROTHELIAL CARCINOMA  . Bladder tumor "multiple"   "some ORs; some removed in office"  . COPD (chronic obstructive pulmonary disease) ([place])   . Depression   . Diabetes mellitus type 2 in obese ([place]) 01/31/2015  . GERD (gastroesophageal reflux disease)   . H/O hiatal hernia   . High cholesterol   . History of blood transfusion 01/2013 - 02/2013   related to ORs  . Hypertension   . Iron deficiency anemia   . Murmur, heart 05/20/2014  . Pneumonia 07/2010; 08/07/2015  . Type 2 diabetes mellitus ([place])     Past Surgical History:  Procedure Laterality Date  . APPENDECTOMY  1980's  . CYSTOSCOPY N/A 12/18/2013   Procedure: CYSTOSCOPY;  Surgeon: Jorja Loa, MD;  Location: [facility];  Service: Urology;  Laterality: N/A;  . TRANSURETHRAL RESECTION OF BLADDER TUMOR N/A 02/16/2013   Procedure: TRANSURETHRAL RESECTION OF BLADDER TUMOR (TURBT);  Surgeon: Franchot Gallo, MD;  Location: [facility];  Service: Urology;  Laterality: N/A;  . TRANSURETHRAL RESECTION OF BLADDER TUMOR N/A 12/18/2013   Procedure: TRANSURETHRAL  RESECTION OF BLADDER TUMOR (TURBT) ;  Surgeon: Jorja Loa, MD;  Location: [facility];  Service: Urology;  Laterality: N/A;  . TRANSURETHRAL RESECTION OF BLADDER TUMOR WITH GYRUS (TURBT-GYRUS) N/A 01/23/2013   Procedure: TRANSURETHRAL RESECTION OF BLADDER TUMOR WITH GYRUS ;  Surgeon: Franchot Gallo, MD;  Location: [facility];  Service: Urology;  Laterality: N/A;  . TUBAL LIGATION  1980's    Family History  Problem Relation Age of Onset  . Cancer Father        prostate  . Arthritis Brother     Social History   Socioeconomic History  . Marital status: Widowed    Spouse name: Not on file  . Number of children: Not on file  . Years of education: Not on file  . Highest education level: Not on file  Occupational History  . Not on file  Social Needs  . Financial resource strain: Not on file  . Food insecurity:    Worry: Not on file    Inability: Not on file  . Transportation needs:    Medical: Not on file    Non-medical: Not on file  Tobacco Use  . Smoking status: Former Smoker    Packs/day: 1.00    Years: 35.00    Pack years: 35.00    Types: Cigarettes    Last attempt to quit: 07/15/2010    Years since quitting: 7.2  . Smokeless tobacco: Never Used  Substance and Sexual Activity  . Alcohol use: Yes  Comment: wine socially   . Drug use: No  . Sexual activity: Not on file  Lifestyle  . Physical activity:    Days per week: Not on file    Minutes per session: Not on file  . Stress: Not on file  Relationships  . Social connections:    Talks on phone: Not on file    Gets together: Not on file    Attends religious service: Not on file    Active member of club or organization: Not on file    Attends meetings of clubs or organizations: Not on file    Relationship status: Not on file  . Intimate partner violence:    Fear of current or ex partner: Not on file    Emotionally abused: Not on file    Physically abused: Not on file    Forced sexual  activity: Not on file  Other Topics Concern  . Not on file  Social History Narrative  . Not on file    Outpatient Medications Prior to Visit  Medication Sig Dispense Refill  . albuterol (PROAIR HFA) 108 (90 Base) MCG/ACT inhaler Inhale 2 puffs into the lungs every 6 (six) hours as needed for wheezing or shortness of breath.    . Calcium Citrate-Vitamin D (CITRACAL + D PO) Take 2 tablets by mouth 2 (two) times daily.     . citalopram (CELEXA) 40 MG tablet TAKE 1 TABLET BY MOUTH ONCE DAILY 90 tablet 1  . Coenzyme Q10 (COQ10) 200 MG CAPS Take 1 capsule by mouth daily.    . ferrous sulfate (FEOSOL) 325 (65 FE) MG tablet Take 325 mg by mouth daily with breakfast.     . guaiFENesin (MUCINEX) 600 MG 12 hr tablet Take 1 tablet (600 mg total) by mouth 2 (two) times daily. 20 tablet 0  . losartan (COZAAR) 50 MG tablet Take 1 tablet (50 mg total) by mouth daily. 90 tablet 0  . MELATONIN PO Take 1 tablet by mouth at bedtime.    . metFORMIN (GLUCOPHAGE) 500 MG tablet TAKE 1 TABLET BY MOUTH TWO  TIMES DAILY WITH A MEAL 180 tablet 3  . metoprolol succinate (TOPROL-XL) 25 MG 24 hr tablet TAKE 1 TABLET BY MOUTH  DAILY 90 tablet 3  . Multiple Vitamins-Minerals (ZINC PO) Take 1 tablet by mouth daily.    Marland Kitchen omeprazole (PRILOSEC) 20 MG capsule TAKE 1 CAPSULE BY MOUTH TWO TIMES DAILY AS NEEDED 180 capsule 0  . pravastatin (PRAVACHOL) 10 MG tablet TAKE 1 TABLET BY MOUTH  DAILY 90 tablet 0  . pravastatin (PRAVACHOL) 10 MG tablet TAKE 1 TABLET BY MOUTH  DAILY 90 tablet 0  . Probiotic Product (PROBIOTIC PO) Take 1 capsule by mouth daily.    . budesonide-formoterol (SYMBICORT) 160-4.5 MCG/ACT inhaler USE 2 PUFFS TWO TIMES DAILY 30.6 g 4  . EPINEPHrine 0.3 mg/0.3 mL IJ SOAJ injection Inject 0.3 mLs (0.3 mg total) into the muscle once. (Patient not taking: Reported on 02/12/2017) 1 Device 1   Facility-Administered Medications Prior to Visit  Medication Dose Route Frequency Provider Last Rate Last Dose  . mitoMYcin  (MUTAMYCIN) chemo injection 40 mg  40 mg Bladder Instillation Once Franchot Gallo, MD        Allergies  Allergen Reactions  . Bee Venom Shortness Of Breath  . Other Shortness Of Breath    MSG.  . Latex Itching    Review of Systems  Constitutional: Negative for fever and malaise/fatigue.  HENT: Negative for congestion.  Eyes: Negative for blurred vision.  Respiratory: Negative for cough and shortness of breath.   Cardiovascular: Negative for chest pain, palpitations and leg swelling.  Gastrointestinal: Negative for vomiting.  Musculoskeletal: Negative for back pain.  Skin: Negative for rash.  Neurological: Negative for loss of consciousness and headaches.       Objective:    Physical Exam  Constitutional: She is oriented to person, place, and time. She appears well-developed and well-nourished. No distress.  HENT:  Head: Normocephalic and atraumatic.  Eyes: Conjunctivae are normal.  Neck: Normal range of motion. No thyromegaly present.  Cardiovascular: Normal rate and regular rhythm.  Pulmonary/Chest: Effort normal and breath sounds normal. She has no wheezes.  Abdominal: Soft. Bowel sounds are normal. There is no tenderness.  Genitourinary: No vaginal discharge found.  Musculoskeletal: Normal range of motion. She exhibits no edema or deformity.  Neurological: She is alert and oriented to person, place, and time.  Skin: Skin is warm and dry. She is not diaphoretic.  Psychiatric: She has a normal mood and affect.    BP 110/64 (BP Location: Left Arm, Patient Position: Sitting, Cuff Size: Normal)   Pulse 84   Temp 98.2 F (36.8 C) (Oral)   Resp 18   Ht 5\' 3"  (1.6 m)   Wt 206 lb 12.8 oz (93.8 kg)   SpO2 95%   BMI 36.63 kg/m  Wt Readings from Last 3 Encounters:  10/01/17 206 lb 12.8 oz (93.8 kg)  08/24/17 207 lb 12.8 oz (94.3 kg)  02/12/17 207 lb 3.2 oz (94 kg)   BP Readings from Last 3 Encounters:  10/01/17 110/64  08/24/17 138/77  02/12/17 118/68      Immunization History  Administered Date(s) Administered  . Influenza Whole 11/24/2010  . Influenza, Seasonal, Injecte, Preservative Fre 12/28/2014  . Influenza,inj,Quad PF,6+ Mos 11/17/2013  . Influenza-Unspecified 11/17/2012, 11/29/2015  . Pneumococcal Conjugate-13 11/17/2013  . Pneumococcal Polysaccharide-23 11/09/2011, 02/12/2017  . Tdap 11/24/2010  . Zoster 03/17/2007    Health Maintenance  Topic Date Due  . FOOT EXAM  08/30/1958  . OPHTHALMOLOGY EXAM  08/30/1958  . MAMMOGRAM  11/23/2012  . DEXA SCAN  08/29/2013  . INFLUENZA VACCINE  10/14/2017  . HEMOGLOBIN A1C  04/03/2018  . COLONOSCOPY  11/23/2020  . TETANUS/TDAP  11/23/2020  . Hepatitis C Screening  Completed  . PNA vac Low Risk Adult  Completed    Lab Results  Component Value Date   WBC 7.3 10/01/2017   HGB 13.7 10/01/2017   HCT 42.1 10/01/2017   PLT 260.0 10/01/2017   GLUCOSE 142 (H) 10/01/2017   CHOL 169 10/01/2017   TRIG 114.0 10/01/2017   HDL 67.90 10/01/2017   LDLCALC 78 10/01/2017   ALT 10 10/01/2017   AST 12 10/01/2017   NA 136 10/01/2017   K 4.0 10/01/2017   CL 97 10/01/2017   CREATININE 0.61 10/01/2017   BUN 16 10/01/2017   CO2 30 10/01/2017   TSH 0.83 10/01/2017   INR 1.04 01/18/2013   HGBA1C 7.0 (H) 10/01/2017   MICROALBUR <0.7 07/15/2015    Lab Results  Component Value Date   TSH 0.83 10/01/2017   Lab Results  Component Value Date   WBC 7.3 10/01/2017   HGB 13.7 10/01/2017   HCT 42.1 10/01/2017   MCV 90.6 10/01/2017   PLT 260.0 10/01/2017   Lab Results  Component Value Date   NA 136 10/01/2017   K 4.0 10/01/2017   CO2 30 10/01/2017   GLUCOSE 142 (H) 10/01/2017  BUN 16 10/01/2017   CREATININE 0.61 10/01/2017   BILITOT 0.4 10/01/2017   ALKPHOS 55 10/01/2017   AST 12 10/01/2017   ALT 10 10/01/2017   PROT 7.1 10/01/2017   ALBUMIN 4.3 10/01/2017   CALCIUM 9.5 10/01/2017   ANIONGAP 9 08/09/2015   GFR 103.33 10/01/2017   Lab Results  Component Value Date   CHOL  169 10/01/2017   Lab Results  Component Value Date   HDL 67.90 10/01/2017   Lab Results  Component Value Date   LDLCALC 78 10/01/2017   Lab Results  Component Value Date   TRIG 114.0 10/01/2017   Lab Results  Component Value Date   CHOLHDL 2 10/01/2017   Lab Results  Component Value Date   HGBA1C 7.0 (H) 10/01/2017         Assessment & Plan:   Problem List Items Addressed This Visit    COPD (chronic obstructive pulmonary disease) ([place])   Relevant Medications   Fluticasone-Salmeterol (ADVAIR DISKUS) 250-50 MCG/DOSE AEPB   Hypertension - Primary    Well controlled, no changes to meds. Encouraged heart healthy diet such as the DASH diet and exercise as tolerated.       Relevant Orders   CBC (Completed)   Comprehensive metabolic panel (Completed)   TSH (Completed)   Hyperlipidemia    Encouraged heart healthy diet, increase exercise, avoid trans fats, consider a krill oil cap daily      Relevant Orders   Lipid panel (Completed)   Esophageal reflux    Doing well with no meds most days.       Diabetes mellitus type 2 in obese ([facility])    hgba1c acceptable, minimize simple carbs. Increase exercise as tolerated.       Relevant Orders   Hemoglobin A1c (Completed)      I have discontinued Samiha Q. Simoni "Holly"'s budesonide-formoterol. I am also having her start on Fluticasone-Salmeterol. Additionally, I am having her maintain her ferrous sulfate, Calcium Citrate-Vitamin D (CITRACAL + D PO), Multiple Vitamins-Minerals (ZINC PO), CoQ10, EPINEPHrine, guaiFENesin, Probiotic Product (PROBIOTIC PO), MELATONIN PO, pravastatin, metFORMIN, metoprolol succinate, omeprazole, losartan, albuterol, pravastatin, and citalopram.  Meds ordered this encounter  Medications  . Fluticasone-Salmeterol (ADVAIR DISKUS) 250-50 MCG/DOSE AEPB    Sig: Inhale 1 puff into the lungs 2 (two) times daily.    Dispense:  3 each    Refill:  1    CMA served as scribe during this visit. History,  Physical and Plan performed by medical provider. Documentation and orders reviewed and attested to.  Penni Homans, MD

## 2017-10-01 NOTE — Patient Instructions (Addendum)

## 2017-10-01 NOTE — Assessment & Plan Note (Signed)
hgba1c acceptable, minimize simple carbs. Increase exercise as tolerated.  

## 2017-10-03 NOTE — Assessment & Plan Note (Signed)
Doing well with no meds most days.

## 2017-10-03 NOTE — Assessment & Plan Note (Signed)
Well controlled, no changes to meds. Encouraged heart healthy diet such as the DASH diet and exercise as tolerated.  °

## 2017-10-03 NOTE — Assessment & Plan Note (Signed)
Encouraged heart healthy diet, increase exercise, avoid trans fats, consider a krill oil cap daily 

## 2017-10-08 ENCOUNTER — Encounter: Payer: Self-pay | Admitting: Family Medicine

## 2017-10-12 MED ORDER — FLUTICASONE FUROATE-VILANTEROL 100-25 MCG/INH IN AEPB: 1.0000 | INHALATION_SPRAY | Freq: Every day | RESPIRATORY_TRACT | 5 refills | Status: DC

## 2017-10-15 ENCOUNTER — Other Ambulatory Visit: Payer: Self-pay

## 2017-10-15 NOTE — Telephone Encounter (Signed)
Received fax from pharmacy requesting refill on BREO. Refill already sent in electronically on 10/12/17. Fax shredded.

## 2017-10-23 ENCOUNTER — Other Ambulatory Visit: Payer: Self-pay | Admitting: Family Medicine

## 2017-11-15 ENCOUNTER — Other Ambulatory Visit: Payer: Self-pay | Admitting: Family Medicine

## 2017-11-26 DIAGNOSIS — C678 Malignant neoplasm of overlapping sites of bladder: Secondary | ICD-10-CM | POA: Diagnosis not present

## 2017-11-29 DIAGNOSIS — Z23 Encounter for immunization: Secondary | ICD-10-CM | POA: Diagnosis not present

## 2017-12-13 ENCOUNTER — Other Ambulatory Visit: Payer: Self-pay | Admitting: Family Medicine

## 2017-12-13 ENCOUNTER — Encounter: Payer: Self-pay | Admitting: Family Medicine

## 2017-12-13 MED ORDER — DOXYCYCLINE HYCLATE 100 MG PO TABS: 100.0000 mg | ORAL_TABLET | Freq: Two times a day (BID) | ORAL | 0 refills | Status: DC

## 2017-12-13 MED ORDER — METHYLPREDNISOLONE 4 MG PO TABS: ORAL_TABLET | ORAL | 0 refills | Status: DC

## 2017-12-14 ENCOUNTER — Encounter: Payer: Self-pay | Admitting: Family Medicine

## 2017-12-14 MED ORDER — DOXYCYCLINE HYCLATE 100 MG PO TABS: 100.0000 mg | ORAL_TABLET | Freq: Two times a day (BID) | ORAL | 0 refills | Status: DC

## 2017-12-14 MED ORDER — METHYLPREDNISOLONE 4 MG PO TABS: ORAL_TABLET | ORAL | 0 refills | Status: DC

## 2018-01-06 ENCOUNTER — Other Ambulatory Visit: Payer: Self-pay | Admitting: Family Medicine

## 2018-02-01 ENCOUNTER — Other Ambulatory Visit: Payer: Self-pay | Admitting: Family Medicine

## 2018-03-28 ENCOUNTER — Other Ambulatory Visit: Payer: Self-pay | Admitting: Family Medicine

## 2018-03-31 ENCOUNTER — Other Ambulatory Visit: Payer: Self-pay | Admitting: Family Medicine

## 2018-04-04 ENCOUNTER — Other Ambulatory Visit: Payer: Self-pay | Admitting: Family Medicine

## 2018-04-07 ENCOUNTER — Encounter: Payer: Self-pay | Admitting: Family Medicine

## 2018-04-07 ENCOUNTER — Ambulatory Visit (INDEPENDENT_AMBULATORY_CARE_PROVIDER_SITE_OTHER): Payer: Medicare Other | Admitting: Family Medicine

## 2018-04-07 VITALS — BP 122/82 | HR 90 | Temp 98.5°F | Resp 18 | Wt 206.2 lb

## 2018-04-07 DIAGNOSIS — E669 Obesity, unspecified: Secondary | ICD-10-CM | POA: Diagnosis not present

## 2018-04-07 DIAGNOSIS — D649 Anemia, unspecified: Secondary | ICD-10-CM

## 2018-04-07 DIAGNOSIS — E782 Mixed hyperlipidemia: Secondary | ICD-10-CM | POA: Diagnosis not present

## 2018-04-07 DIAGNOSIS — J441 Chronic obstructive pulmonary disease with (acute) exacerbation: Secondary | ICD-10-CM | POA: Diagnosis not present

## 2018-04-07 DIAGNOSIS — E1169 Type 2 diabetes mellitus with other specified complication: Secondary | ICD-10-CM | POA: Diagnosis not present

## 2018-04-07 DIAGNOSIS — I10 Essential (primary) hypertension: Secondary | ICD-10-CM | POA: Diagnosis not present

## 2018-04-07 DIAGNOSIS — Z0000 Encounter for general adult medical examination without abnormal findings: Secondary | ICD-10-CM | POA: Diagnosis not present

## 2018-04-07 DIAGNOSIS — D72829 Elevated white blood cell count, unspecified: Secondary | ICD-10-CM

## 2018-04-07 LAB — LIPID PANEL
Cholesterol: 186 mg/dL (ref 0–200)
HDL: 69.3 mg/dL (ref >39.00)
LDL Cholesterol: 96 mg/dL (ref 0–99)
NONHDL: 116.42
Total CHOL/HDL Ratio: 3
Triglycerides: 103 mg/dL (ref 0.0–149.0)
VLDL: 20.6 mg/dL (ref 0.0–40.0)

## 2018-04-07 LAB — CBC WITH DIFFERENTIAL/PLATELET
BASOS ABS: 0.1 10*3/uL (ref 0.0–0.1)
BASOS PCT: 1 % (ref 0.0–3.0)
EOS ABS: 0 10*3/uL (ref 0.0–0.7)
Eosinophils Relative: 0.2 % (ref 0.0–5.0)
HEMATOCRIT: 41.4 % (ref 36.0–46.0)
Hemoglobin: 13.2 g/dL (ref 12.0–15.0)
LYMPHS ABS: 1.4 10*3/uL (ref 0.7–4.0)
LYMPHS PCT: 11.1 % — AB (ref 12.0–46.0)
MCHC: 31.9 g/dL (ref 30.0–36.0)
MCV: 87.2 fl (ref 78.0–100.0)
MONO ABS: 0.9 10*3/uL (ref 0.1–1.0)
Monocytes Relative: 7 % (ref 3.0–12.0)
NEUTROS ABS: 9.9 10*3/uL — AB (ref 1.4–7.7)
NEUTROS PCT: 80.7 % — AB (ref 43.0–77.0)
PLATELETS: 252 10*3/uL (ref 150.0–400.0)
RBC: 4.75 Mil/uL (ref 3.87–5.11)
RDW: 14.1 % (ref 11.5–15.5)
WBC: 12.3 10*3/uL — ABNORMAL HIGH (ref 4.0–10.5)

## 2018-04-07 LAB — COMPREHENSIVE METABOLIC PANEL
ALT: 11 U/L (ref 0–35)
AST: 13 U/L (ref 0–37)
Albumin: 4.4 g/dL (ref 3.5–5.2)
Alkaline Phosphatase: 50 U/L (ref 39–117)
BUN: 16 mg/dL (ref 6–23)
CO2: 28 mEq/L (ref 19–32)
Calcium: 10 mg/dL (ref 8.4–10.5)
Chloride: 99 mEq/L (ref 96–112)
Creatinine, Ser: 0.68 mg/dL (ref 0.40–1.20)
GFR: 85.64 mL/min (ref >60.00)
Glucose, Bld: 99 mg/dL (ref 70–99)
Potassium: 4.4 mEq/L (ref 3.5–5.1)
Sodium: 136 mEq/L (ref 135–145)
Total Bilirubin: 0.4 mg/dL (ref 0.2–1.2)
Total Protein: 6.8 g/dL (ref 6.0–8.3)

## 2018-04-07 LAB — TSH: TSH: 1.23 u[IU]/mL (ref 0.35–4.50)

## 2018-04-07 LAB — HEMOGLOBIN A1C: Hgb A1c MFr Bld: 7.3 % — ABNORMAL HIGH (ref 4.6–6.5)

## 2018-04-07 MED ORDER — FLUTICASONE-UMECLIDIN-VILANT 100-62.5-25 MCG/INH IN AEPB: 1.0000 | INHALATION_SPRAY | Freq: Every day | RESPIRATORY_TRACT | 5 refills | Status: DC

## 2018-04-07 NOTE — Assessment & Plan Note (Addendum)
Recent exacerbation well treated. Feels well today except she just had implants dental placed yesterday. Notes symptoms of burning and not much improvement with Breo. She has failed Symbicort and Advair. Will try Trelegy and offered pulmonology consult now but declines for now

## 2018-04-07 NOTE — Assessment & Plan Note (Signed)
Increase leafy greens, consider increased lean red meat and using cast iron cookware. Continue to monitor, report any concerns 

## 2018-04-07 NOTE — Assessment & Plan Note (Signed)
hgba1c acceptable, minimize simple carbs. Increase exercise as tolerated. Continue current meds 

## 2018-04-07 NOTE — Patient Instructions (Addendum)
Tylenol 3000 mg max in 24 hours Advil 2400 mg in 24 hours (400 mg dose is optimal)  Copy of advanced directive  Recommend calcium intake of 1200 to 1500 mg daily, divided into roughly 3 doses. Best source is the diet and a single dairy serving is about 500 mg, a supplement of calcium citrate once or twice daily to balance diet is fine if not getting enough in diet. Also need Vitamin D 2000 IU caps, 1 cap daily if not already taking vitamin D. Also recommend weight baring exercise on hips and upper body to keep bones strong Preventive Care 65 Years and Older, Female Preventive care refers to lifestyle choices and visits with your health care provider that can promote health and wellness. What does preventive care include?  A yearly physical exam. This is also called an annual well check.  Dental exams once or twice a year.  Routine eye exams. Ask your health care provider how often you should have your eyes checked.  Personal lifestyle choices, including: ? Daily care of your teeth and gums. ? Regular physical activity. ? Eating a healthy diet. ? Avoiding tobacco and drug use. ? Limiting alcohol use. ? Practicing safe sex. ? Taking low-dose aspirin every day. ? Taking vitamin and mineral supplements as recommended by your health care provider. What happens during an annual well check? The services and screenings done by your health care provider during your annual well check will depend on your age, overall health, lifestyle risk factors, and family history of disease. Counseling Your health care provider may ask you questions about your:  Alcohol use.  Tobacco use.  Drug use.  Emotional well-being.  Home and relationship well-being.  Sexual activity.  Eating habits.  History of falls.  Memory and ability to understand (cognition).  Work and work Statistician.  Reproductive health.  Screening You may have the following tests or measurements:  Height, weight, and  BMI.  Blood pressure.  Lipid and cholesterol levels. These may be checked every 5 years, or more frequently if you are over 1 years old.  Skin check.  Lung cancer screening. You may have this screening every year starting at age 44 if you have a 30-pack-year history of smoking and currently smoke or have quit within the past 15 years.  Colorectal cancer screening. All adults should have this screening starting at age 30 and continuing until age 69. You will have tests every 1-10 years, depending on your results and the type of screening test. People at increased risk should start screening at an earlier age. Screening tests may include: ? Guaiac-based fecal occult blood testing. ? Fecal immunochemical test (FIT). ? Stool DNA test. ? Virtual colonoscopy. ? Sigmoidoscopy. During this test, a flexible tube with a tiny camera (sigmoidoscope) is used to examine your rectum and lower colon. The sigmoidoscope is inserted through your anus into your rectum and lower colon. ? Colonoscopy. During this test, a long, thin, flexible tube with a tiny camera (colonoscope) is used to examine your entire colon and rectum.  Hepatitis C blood test.  Hepatitis B blood test.  Sexually transmitted disease (STD) testing.  Diabetes screening. This is done by checking your blood sugar (glucose) after you have not eaten for a while (fasting). You may have this done every 1-3 years.  Bone density scan. This is done to screen for osteoporosis. You may have this done starting at age 62.  Mammogram. This may be done every 1-2 years. Talk to your health  care provider about how often you should have regular mammograms. Talk with your health care provider about your test results, treatment options, and if necessary, the need for more tests. Vaccines Your health care provider may recommend certain vaccines, such as:  Influenza vaccine. This is recommended every year.  Tetanus, diphtheria, and acellular pertussis  (Tdap, Td) vaccine. You may need a Td booster every 10 years.  Varicella vaccine. You may need this if you have not been vaccinated.  Zoster vaccine. You may need this after age 54.  Measles, mumps, and rubella (MMR) vaccine. You may need at least one dose of MMR if you were born in 1957 or later. You may also need a second dose.  Pneumococcal 13-valent conjugate (PCV13) vaccine. One dose is recommended after age 61.  Pneumococcal polysaccharide (PPSV23) vaccine. One dose is recommended after age 9.  Meningococcal vaccine. You may need this if you have certain conditions.  Hepatitis A vaccine. You may need this if you have certain conditions or if you travel or work in places where you may be exposed to hepatitis A.  Hepatitis B vaccine. You may need this if you have certain conditions or if you travel or work in places where you may be exposed to hepatitis B.  Haemophilus influenzae type b (Hib) vaccine. You may need this if you have certain conditions. Talk to your health care provider about which screenings and vaccines you need and how often you need them. This information is not intended to replace advice given to you by your health care provider. Make sure you discuss any questions you have with your health care provider. Document Released: 03/29/2015 Document Revised: 04/22/2017 Document Reviewed: 01/01/2015 Elsevier Interactive Patient Education  2019 [employer].

## 2018-04-07 NOTE — Assessment & Plan Note (Signed)
Tolerating statin, encouraged heart healthy diet, avoid trans fats, minimize simple carbs and saturated fats. Increase exercise as tolerated 

## 2018-04-07 NOTE — Assessment & Plan Note (Signed)
Well controlled, no changes to meds. Encouraged heart healthy diet such as the DASH diet and exercise as tolerated.  °

## 2018-04-07 NOTE — Progress Notes (Signed)
Subjective:    Patient ID: Savannah Stanley, female    DOB: 1950-08-01, 68 y.o.   MRN: [id number]  No chief complaint on file.   HPI Patient is in today for annual preventative exam and follow up on chronic medical concerns including diabetes, copd, hyperlipidemia and hypertension. No polyuria or polydipsia. Is trying to maintain a heart healthy diet. Stays acitve. Is struggling with the stress of caring for a family member with frontotemporal dementia. She just had 3 teeth removed and is working on dental implants. No recent hospitalizations or acute concerns. She does not feel her Memory Dance is helping adequately and she is requesting a trial of Trelegy. Denies CP/palp/HA/congestion/fevers/GI or GU c/o. Taking meds as prescribed  Past Medical History:  Diagnosis Date  . Acute bronchitis with COPD ([place]) 03/19/2015  . Bladder cancer (H[facility]) UROLOGIST-- DR Diona Fanti   HIGH-GRADE UROTHELIAL CARCINOMA  . Bladder tumor "multiple"   "some ORs; some removed in office"  . COPD (chronic obstructive pulmonary disease) ([place])   . Depression   . Diabetes mellitus type 2 in obese ([place]) 01/31/2015  . GERD (gastroesophageal reflux disease)   . H/O hiatal hernia   . High cholesterol   . History of blood transfusion 01/2013 - 02/2013   related to ORs  . Hypertension   . Iron deficiency anemia   . Murmur, heart 05/20/2014  . Pneumonia 07/2010; 08/07/2015  . Type 2 diabetes mellitus ([place])     Past Surgical History:  Procedure Laterality Date  . APPENDECTOMY  1980's  . CYSTOSCOPY N/A 12/18/2013   Procedure: CYSTOSCOPY;  Surgeon: Jorja Loa, MD;  Location: [facility];  Service: Urology;  Laterality: N/A;  . TRANSURETHRAL RESECTION OF BLADDER TUMOR N/A 02/16/2013   Procedure: TRANSURETHRAL RESECTION OF BLADDER TUMOR (TURBT);  Surgeon: Franchot Gallo, MD;  Location: [facility];  Service: Urology;  Laterality: N/A;  . TRANSURETHRAL RESECTION OF BLADDER TUMOR N/A 12/18/2013     Procedure: TRANSURETHRAL RESECTION OF BLADDER TUMOR (TURBT) ;  Surgeon: Jorja Loa, MD;  Location: [facility];  Service: Urology;  Laterality: N/A;  . TRANSURETHRAL RESECTION OF BLADDER TUMOR WITH GYRUS (TURBT-GYRUS) N/A 01/23/2013   Procedure: TRANSURETHRAL RESECTION OF BLADDER TUMOR WITH GYRUS ;  Surgeon: Franchot Gallo, MD;  Location: [facility];  Service: Urology;  Laterality: N/A;  . TUBAL LIGATION  1980's    Family History  Problem Relation Age of Onset  . Cancer Father        prostate  . Arthritis Brother   . Frontotemporal dementia Brother     Social History   Socioeconomic History  . Marital status: Widowed    Spouse name: Not on file  . Number of children: Not on file  . Years of education: Not on file  . Highest education level: Not on file  Occupational History  . Not on file  Social Needs  . Financial resource strain: Not on file  . Food insecurity:    Worry: Not on file    Inability: Not on file  . Transportation needs:    Medical: Not on file    Non-medical: Not on file  Tobacco Use  . Smoking status: Former Smoker    Packs/day: 1.00    Years: 35.00    Pack years: 35.00    Types: Cigarettes    Last attempt to quit: 07/15/2010    Years since quitting: 7.7  . Smokeless tobacco: Never Used  Substance and Sexual Activity  .  Alcohol use: Yes    Comment: wine socially   . Drug use: No  . Sexual activity: Not on file  Lifestyle  . Physical activity:    Days per week: Not on file    Minutes per session: Not on file  . Stress: Not on file  Relationships  . Social connections:    Talks on phone: Not on file    Gets together: Not on file    Attends religious service: Not on file    Active member of club or organization: Not on file    Attends meetings of clubs or organizations: Not on file    Relationship status: Not on file  . Intimate partner violence:    Fear of current or ex partner: Not on file    Emotionally abused: Not  on file    Physically abused: Not on file    Forced sexual activity: Not on file  Other Topics Concern  . Not on file  Social History Narrative  . Not on file    Outpatient Medications Prior to Visit  Medication Sig Dispense Refill  . albuterol (PROAIR HFA) 108 (90 Base) MCG/ACT inhaler Inhale 2 puffs into the lungs every 6 (six) hours as needed for wheezing or shortness of breath.    . Calcium Citrate-Vitamin D (CITRACAL + D PO) Take 2 tablets by mouth 2 (two) times daily.     . citalopram (CELEXA) 40 MG tablet TAKE 1 TABLET BY MOUTH ONCE DAILY 90 tablet 0  . Coenzyme Q10 (COQ10) 200 MG CAPS Take 1 capsule by mouth daily.    Marland Kitchen doxycycline (VIBRA-TABS) 100 MG tablet Take 1 tablet (100 mg total) by mouth 2 (two) times daily. 20 tablet 0  . ferrous sulfate (FEOSOL) 325 (65 FE) MG tablet Take 325 mg by mouth daily with breakfast.     . fluticasone furoate-vilanterol (BREO ELLIPTA) 100-25 MCG/INH AEPB Inhale 1 puff into the lungs daily. 60 each 5  . guaiFENesin (MUCINEX) 600 MG 12 hr tablet Take 1 tablet (600 mg total) by mouth 2 (two) times daily. 20 tablet 0  . losartan (COZAAR) 50 MG tablet TAKE 1 TABLET BY MOUTH  DAILY 90 tablet 1  . MELATONIN PO Take 1 tablet by mouth at bedtime.    . metFORMIN (GLUCOPHAGE) 500 MG tablet TAKE 1 TABLET BY MOUTH TWO  TIMES DAILY WITH MEALS 180 tablet 3  . methylPREDNISolone (MEDROL) 4 MG tablet 5 tab po qd X 1d then 4 tab po qd X 1d then 3 tab po qd X 1d then 2 tab po qd then 1 tab po qd 15 tablet 0  . metoprolol succinate (TOPROL-XL) 25 MG 24 hr tablet TAKE 1 TABLET BY MOUTH  DAILY 90 tablet 3  . Multiple Vitamins-Minerals (ZINC PO) Take 1 tablet by mouth daily.    Marland Kitchen omeprazole (PRILOSEC) 20 MG capsule TAKE 1 CAPSULE BY MOUTH TWO TIMES DAILY AS NEEDED 180 capsule 0  . pravastatin (PRAVACHOL) 10 MG tablet TAKE 1 TABLET BY MOUTH  DAILY 90 tablet 1  . Probiotic Product (PROBIOTIC PO) Take 1 capsule by mouth daily.    . pravastatin (PRAVACHOL) 10 MG tablet  TAKE 1 TABLET BY MOUTH  DAILY 90 tablet 0  . pravastatin (PRAVACHOL) 10 MG tablet TAKE 1 TABLET BY MOUTH  DAILY 90 tablet 0  . pravastatin (PRAVACHOL) 10 MG tablet TAKE 1 TABLET BY MOUTH  DAILY 90 tablet 0  . EPINEPHrine 0.3 mg/0.3 mL IJ SOAJ injection Inject 0.3 mLs (  0.3 mg total) into the muscle once. (Patient not taking: Reported on 02/12/2017) 1 Device 1   Facility-Administered Medications Prior to Visit  Medication Dose Route Frequency Provider Last Rate Last Dose  . mitoMYcin (MUTAMYCIN) chemo injection 40 mg  40 mg Bladder Instillation Once Franchot Gallo, MD        Allergies  Allergen Reactions  . Bee Venom Shortness Of Breath  . Other Shortness Of Breath    MSG.  . Latex Itching    Review of Systems  Constitutional: Negative for chills, fever and malaise/fatigue.  HENT: Positive for congestion. Negative for hearing loss.   Eyes: Negative for discharge.  Respiratory: Positive for sputum production. Negative for cough and shortness of breath.   Cardiovascular: Negative for chest pain, palpitations and leg swelling.  Gastrointestinal: Negative for abdominal pain, blood in stool, constipation, diarrhea, heartburn, nausea and vomiting.  Genitourinary: Negative for dysuria, frequency, hematuria and urgency.  Musculoskeletal: Negative for back pain, falls and myalgias.  Skin: Negative for rash.  Neurological: Negative for dizziness, sensory change, loss of consciousness, weakness and headaches.  Endo/Heme/Allergies: Negative for environmental allergies. Does not bruise/bleed easily.  Psychiatric/Behavioral: Negative for depression and suicidal ideas. The patient is not nervous/anxious and does not have insomnia.        Objective:    Physical Exam Constitutional:      General: She is not in acute distress.    Appearance: She is well-developed.  HENT:     Head: Normocephalic and atraumatic.  Eyes:     Conjunctiva/sclera: Conjunctivae normal.  Neck:      Musculoskeletal: Neck supple.     Thyroid: No thyromegaly.  Cardiovascular:     Rate and Rhythm: Normal rate and regular rhythm.     Heart sounds: Normal heart sounds. No murmur.  Pulmonary:     Effort: Pulmonary effort is normal. No respiratory distress.     Breath sounds: Normal breath sounds.  Abdominal:     General: Bowel sounds are normal. There is no distension.     Palpations: Abdomen is soft. There is no mass.     Tenderness: There is no abdominal tenderness.  Lymphadenopathy:     Cervical: No cervical adenopathy.  Skin:    General: Skin is warm and dry.  Neurological:     Mental Status: She is alert and oriented to person, place, and time.  Psychiatric:        Behavior: Behavior normal.     BP 122/82 (BP Location: Left Arm, Patient Position: Sitting, Cuff Size: Normal)   Pulse 90   Temp 98.5 F (36.9 C) (Oral)   Resp 18   Wt 206 lb 3.2 oz (93.5 kg)   SpO2 97%   BMI 36.53 kg/m  Wt Readings from Last 3 Encounters:  04/07/18 206 lb 3.2 oz (93.5 kg)  10/01/17 206 lb 12.8 oz (93.8 kg)  08/24/17 207 lb 12.8 oz (94.3 kg)     Lab Results  Component Value Date   WBC 7.3 10/01/2017   HGB 13.7 10/01/2017   HCT 42.1 10/01/2017   PLT 260.0 10/01/2017   GLUCOSE 142 (H) 10/01/2017   CHOL 169 10/01/2017   TRIG 114.0 10/01/2017   HDL 67.90 10/01/2017   LDLCALC 78 10/01/2017   ALT 10 10/01/2017   AST 12 10/01/2017   NA 136 10/01/2017   K 4.0 10/01/2017   CL 97 10/01/2017   CREATININE 0.61 10/01/2017   BUN 16 10/01/2017   CO2 30 10/01/2017   TSH 0.83  10/01/2017   INR 1.04 01/18/2013   HGBA1C 7.0 (H) 10/01/2017   MICROALBUR <0.7 07/15/2015    Lab Results  Component Value Date   TSH 0.83 10/01/2017   Lab Results  Component Value Date   WBC 7.3 10/01/2017   HGB 13.7 10/01/2017   HCT 42.1 10/01/2017   MCV 90.6 10/01/2017   PLT 260.0 10/01/2017   Lab Results  Component Value Date   NA 136 10/01/2017   K 4.0 10/01/2017   CO2 30 10/01/2017   GLUCOSE 142  (H) 10/01/2017   BUN 16 10/01/2017   CREATININE 0.61 10/01/2017   BILITOT 0.4 10/01/2017   ALKPHOS 55 10/01/2017   AST 12 10/01/2017   ALT 10 10/01/2017   PROT 7.1 10/01/2017   ALBUMIN 4.3 10/01/2017   CALCIUM 9.5 10/01/2017   ANIONGAP 9 08/09/2015   GFR 103.33 10/01/2017   Lab Results  Component Value Date   CHOL 169 10/01/2017   Lab Results  Component Value Date   HDL 67.90 10/01/2017   Lab Results  Component Value Date   LDLCALC 78 10/01/2017   Lab Results  Component Value Date   TRIG 114.0 10/01/2017   Lab Results  Component Value Date   CHOLHDL 2 10/01/2017   Lab Results  Component Value Date   HGBA1C 7.0 (H) 10/01/2017       Assessment & Plan:   Problem List Items Addressed This Visit    COPD (chronic obstructive pulmonary disease) ([place])    Recent exacerbation well treated. Feels well today except she just had implants dental placed yesterday. Notes symptoms of burning and not much improvement with Breo. She has failed Symbicort and Advair. Will try Trelegy and offered pulmonology consult now but declines for now      Relevant Medications   Fluticasone-Umeclidin-Vilant (TRELEGY ELLIPTA) 100-62.5-25 MCG/INH AEPB   Anemia    Increase leafy greens, consider increased lean red meat and using cast iron cookware. Continue to monitor, report any concerns      Relevant Orders   CBC with Differential/Platelet   Hypertension    Well controlled, no changes to meds. Encouraged heart healthy diet such as the DASH diet and exercise as tolerated.       Relevant Orders   CBC with Differential/Platelet   Comprehensive metabolic panel   TSH   CBC   Comprehensive metabolic panel   TSH   Hyperlipidemia    Tolerating statin, encouraged heart healthy diet, avoid trans fats, minimize simple carbs and saturated fats. Increase exercise as tolerated      Relevant Orders   Lipid panel   Lipid panel   Preventative health care    Patient encouraged to maintain heart  healthy diet, regular exercise, adequate sleep. Consider daily probiotics. Take medications as prescribed      Diabetes mellitus type 2 in obese (HCC)    hgba1c acceptable, minimize simple carbs. Increase exercise as tolerated. Continue current meds      Relevant Orders   Hemoglobin A1c   Comprehensive metabolic panel   Hemoglobin A1c      I am having Savannah Stanley "Holly" start on Fluticasone-Umeclidin-Vilant. I am also having her maintain her ferrous sulfate, Calcium Citrate-Vitamin D (CITRACAL + D PO), Multiple Vitamins-Minerals (ZINC PO), CoQ10, EPINEPHrine, guaiFENesin, Probiotic Product (PROBIOTIC PO), MELATONIN PO, albuterol, fluticasone furoate-vilanterol, doxycycline, methylPREDNISolone, metFORMIN, pravastatin, omeprazole, citalopram, metoprolol succinate, and losartan.  Meds ordered this encounter  Medications  . Fluticasone-Umeclidin-Vilant (TRELEGY ELLIPTA) 100-62.5-25 MCG/INH AEPB    Sig: Inhale  1 Dose into the lungs daily.    Dispense:  1 each    Refill:  5     Penni Homans, MD

## 2018-04-07 NOTE — Assessment & Plan Note (Signed)
Patient encouraged to maintain heart healthy diet, regular exercise, adequate sleep. Consider daily probiotics. Take medications as prescribed 

## 2018-05-02 ENCOUNTER — Telehealth: Payer: Self-pay

## 2018-05-02 NOTE — Telephone Encounter (Signed)
PA denied. Preferred alternatives: Anoro Ellipta, Bevespi Aerosphere, Serevent Diskus, Stiolto Respimat.

## 2018-05-02 NOTE — Telephone Encounter (Signed)
PA initiated via Covermymeds; KEY: [id number]. Awaiting determination.

## 2018-05-02 NOTE — Telephone Encounter (Signed)
If she is willing we could try Anoro please check with her

## 2018-05-04 ENCOUNTER — Other Ambulatory Visit: Payer: Self-pay | Admitting: Family Medicine

## 2018-05-04 ENCOUNTER — Other Ambulatory Visit (INDEPENDENT_AMBULATORY_CARE_PROVIDER_SITE_OTHER): Payer: Medicare Other

## 2018-05-04 DIAGNOSIS — D72829 Elevated white blood cell count, unspecified: Secondary | ICD-10-CM | POA: Diagnosis not present

## 2018-05-04 LAB — CBC WITH DIFFERENTIAL/PLATELET
Basophils Absolute: 0.1 10*3/uL (ref 0.0–0.1)
Basophils Relative: 0.6 % (ref 0.0–3.0)
Eosinophils Absolute: 0.1 10*3/uL (ref 0.0–0.7)
Eosinophils Relative: 1.6 % (ref 0.0–5.0)
HCT: 40.1 % (ref 36.0–46.0)
HEMOGLOBIN: 13.1 g/dL (ref 12.0–15.0)
Lymphocytes Relative: 22.5 % (ref 12.0–46.0)
Lymphs Abs: 1.8 10*3/uL (ref 0.7–4.0)
MCHC: 32.6 g/dL (ref 30.0–36.0)
MCV: 86.9 fl (ref 78.0–100.0)
MONOS PCT: 9 % (ref 3.0–12.0)
Monocytes Absolute: 0.7 10*3/uL (ref 0.1–1.0)
Neutro Abs: 5.2 10*3/uL (ref 1.4–7.7)
Neutrophils Relative %: 66.3 % (ref 43.0–77.0)
Platelets: 243 10*3/uL (ref 150.0–400.0)
RBC: 4.61 Mil/uL (ref 3.87–5.11)
RDW: 14.6 % (ref 11.5–15.5)
WBC: 7.8 10*3/uL (ref 4.0–10.5)

## 2018-05-05 NOTE — Telephone Encounter (Signed)
Called patient left voicemail for patient to call the office back.]\  Nurse triage may handle  

## 2018-05-06 ENCOUNTER — Other Ambulatory Visit: Payer: Medicare Other

## 2018-05-06 LAB — PROTEIN ELECTROPHORESIS, SERUM
Albumin ELP: 3.9 g/dL (ref 3.8–4.8)
Alpha 1: 0.3 g/dL (ref 0.2–0.3)
Alpha 2: 0.8 g/dL (ref 0.5–0.9)
Beta 2: 0.3 g/dL (ref 0.2–0.5)
Beta Globulin: 0.5 g/dL (ref 0.4–0.6)
Gamma Globulin: 0.7 g/dL — ABNORMAL LOW (ref 0.8–1.7)
Total Protein: 6.4 g/dL (ref 6.1–8.1)

## 2018-05-24 ENCOUNTER — Other Ambulatory Visit: Payer: Self-pay | Admitting: Family Medicine

## 2018-05-24 ENCOUNTER — Encounter: Payer: Self-pay | Admitting: Family Medicine

## 2018-05-24 MED ORDER — BUDESONIDE-FORMOTEROL FUMARATE 160-4.5 MCG/ACT IN AERO: 2.0000 | INHALATION_SPRAY | Freq: Two times a day (BID) | RESPIRATORY_TRACT | 1 refills | Status: DC

## 2018-06-02 ENCOUNTER — Telehealth: Payer: Self-pay | Admitting: Family Medicine

## 2018-06-02 NOTE — Telephone Encounter (Signed)
Please advise 

## 2018-06-02 NOTE — Telephone Encounter (Signed)
Copied from [place] [id number]. Topic: Quick Communication - Rx Refill/Question >> Jun 02, 2018  9:36 AM Burchel, Abbi R wrote: [facility] requesting rx clarification.  Re: should pt be on Symbicort or Breo or both?  Ph: [phone] Ref #: [id number]

## 2018-06-02 NOTE — Telephone Encounter (Signed)
Check with patient on this. She has had to switch around due to cost. Confirm what she wants then we can let insurance

## 2018-06-09 NOTE — Telephone Encounter (Signed)
Left message for patient to call the office back   Nurse triage may handle  

## 2018-06-13 ENCOUNTER — Encounter: Payer: Self-pay | Admitting: Family Medicine

## 2018-06-14 NOTE — Telephone Encounter (Signed)
See 06/02/18 phone note pasted below.   [name], MD  Family Medicine   Medication Management  Reason for call   Conversation: Medication Management  (Newest Message First)  June 09, 2018  Magdalene Molly, [place]  to [facility] Nurse Triage Pool     9:17 AM  Note    Left message for patient to call the office back   Nurse triage may handle     June 02, 2018  [name], MD  to Magdalene Molly, RMA     5:08 PM  Note    Check with patient on this. She has had to switch around due to cost. Confirm what she wants then we can let insurance     [place], [place], [place]  to [name], MD     10:18 AM  Note    Please advise.         9:41 AM  Burchel, Abbi R routed this conversation to [employer], Abbi R     9:41 AM  Note    Copied from [place] [id number]. Topic: Quick Communication - Rx Refill/Question >> Jun 02, 2018  9:36 AM Burchel, Abbi R wrote: [facility] requesting rx clarification.  Re: should pt be on Symbicort or Breo or both?  Ph: [phone] Ref #: [id number]

## 2018-06-14 NOTE — Telephone Encounter (Signed)
Spoke with CSR at [facility] and verified that pt is taking Symbicort only. Memory Dance was too expensive.

## 2018-06-14 NOTE — Telephone Encounter (Signed)
See 06/13/18 mychart message.

## 2018-06-14 NOTE — Telephone Encounter (Signed)
Please advise 

## 2018-06-28 ENCOUNTER — Other Ambulatory Visit: Payer: Self-pay | Admitting: Family Medicine

## 2018-08-26 ENCOUNTER — Ambulatory Visit: Payer: Medicare Other | Admitting: *Deleted

## 2018-08-29 ENCOUNTER — Other Ambulatory Visit: Payer: Self-pay | Admitting: Family Medicine

## 2018-09-02 DIAGNOSIS — Z8551 Personal history of malignant neoplasm of bladder: Secondary | ICD-10-CM | POA: Diagnosis not present

## 2018-09-16 ENCOUNTER — Other Ambulatory Visit: Payer: Self-pay | Admitting: Family Medicine

## 2018-09-25 ENCOUNTER — Encounter: Payer: Self-pay | Admitting: Family Medicine

## 2018-09-26 ENCOUNTER — Encounter: Payer: Self-pay | Admitting: Family Medicine

## 2018-09-27 ENCOUNTER — Ambulatory Visit (INDEPENDENT_AMBULATORY_CARE_PROVIDER_SITE_OTHER): Payer: Medicare Other | Admitting: Medical

## 2018-09-27 ENCOUNTER — Encounter: Payer: Self-pay | Admitting: Medical

## 2018-09-27 ENCOUNTER — Other Ambulatory Visit: Payer: Self-pay

## 2018-09-27 VITALS — BP 128/72 | HR 95

## 2018-09-27 DIAGNOSIS — H00012 Hordeolum externum right lower eyelid: Secondary | ICD-10-CM | POA: Diagnosis not present

## 2018-09-27 MED ORDER — TOBRAMYCIN 0.3 % OP SOLN: 2.0000 [drp] | Freq: Four times a day (QID) | OPHTHALMIC | 0 refills | Status: DC

## 2018-09-27 NOTE — Patient Instructions (Signed)
Patient does appear to have stye presently by video inspection.  Some improvement with the warm compresses and the Systane eyedrops.  At this point I think she would benefit more from Tobrex antibiotic eyedrops and she thinks stop the Systane.  Continue the warm compresses twice daily.  Asked patient to give me a my chart update in 5days in the event that the region clears completely.  I did ask you to follow-up in 6 to 7 days in the event that the area still lingers.  Explained sometimes will refer to ophthalmologist if area like this persists/does not resolve.

## 2018-09-27 NOTE — Progress Notes (Signed)
   Subjective:    Patient ID: Savannah Stanley, female    DOB: 24-Oct-1950, 68 y.o.   MRN: [id number]  HPI  Virtual Visit via Video Note  I connected with Blane Ohara Santillanes on 09/27/18 at  1:20 PM EDT by a video enabled telemedicine application and verified that I am speaking with the correct person using two identifiers.  Location: Patient: home Provider: home.  Pt did not check bp today. She states will check later and my chart me the readings.   I discussed the limitations of evaluation and management by telemedicine and the availability of in person appointments. The patient expressed understanding and agreed to proceed.  History of Present Illness: Pt states small white pimple type bump lower eye lid margin eye lash area. Pt states started on Saturday. At that time lower lid was swollen.  Did warm compresses and systane otc for 2 days and area decreased in size. But area still feel mild irritated. No DC. No vision changes.    Observations/Objective: General-no acute distress, pleasant, oriented. Lungs- on inspection lungs appear unlabored. Neck- no tracheal deviation or jvd on inspection. Neuro- gross motor function appears intact. Right eye-conjunctiva clear.  No discharge.  Lower lid looks a little bit slightly swollen just beneath the eyelash.  Patient inverts the lid slightly and I can see what appears to be stye.   Assessment and Plan: Patient does appear to have stye presently by video inspection.  Some improvement with the warm compresses and the Systane eyedrops.  At this point I think she would benefit more from Tobrex antibiotic eyedrops and she thinks stop the Systane.  Continue the warm compresses twice daily.  Asked patient to give me a my chart update in 5days in the event that the region clears completely.  I did ask you to follow-up in 6 to 7 days in the event that the area still lingers.  Explained sometimes will refer to ophthalmologist if area like this  persists/does not resolve.  Mackie Pai, PA-C  Follow Up Instructions:    I discussed the assessment and treatment plan with the patient. The patient was provided an opportunity to ask questions and all were answered. The patient agreed with the plan and demonstrated an understanding of the instructions.   The patient was advised to call back or seek an in-person evaluation if the symptoms worsen or if the condition fails to improve as anticipated.  I provided 15 minutes of non-face-to-face time during this encounter.   Mackie Pai, PA-C   Review of Systems     Objective:   Physical Exam        Assessment & Plan:

## 2018-09-28 ENCOUNTER — Encounter: Payer: Self-pay | Admitting: Medical

## 2018-10-03 ENCOUNTER — Other Ambulatory Visit: Payer: Self-pay | Admitting: Family Medicine

## 2018-10-06 ENCOUNTER — Other Ambulatory Visit: Payer: Medicare Other

## 2018-10-10 ENCOUNTER — Ambulatory Visit: Payer: Medicare Other | Admitting: Family Medicine

## 2018-10-17 NOTE — Progress Notes (Signed)
Virtual Visit via Video Note  I connected with patient on 10/18/18 at  8:00 AM EDT by audio enabled telemedicine application and verified that I am speaking with the correct person using two identifiers.   THIS ENCOUNTER IS A VIRTUAL VISIT DUE TO COVID-19 - PATIENT WAS NOT SEEN IN THE OFFICE. PATIENT HAS CONSENTED TO VIRTUAL VISIT / TELEMEDICINE VISIT   Location of patient: home  Location of provider: office  I discussed the limitations of evaluation and management by telemedicine and the availability of in person appointments. The patient expressed understanding and agreed to proceed.   Subjective:   Savannah Stanley is a 68 y.o. female who presents for Medicare Annual (Subsequent) preventive examination.  Visits friends and family often. Reports she has a great support system.  Review of Systems: No ROS.  Medicare Wellness Virtual Visit.  Visual/audio telehealth visit, UTA vital signs.   See social history for additional risk factors.   Sleep patterns: Melatonin 12mg  at bedtime. Sleep still varies. Has trouble staying asleep but it is improving.  Home Safety/Smoke Alarms: Feels safe in home. Smoke alarms in place.   Lives alone in 2 story home. Walk-in shower.   Female:       Mammo- declines      Dexa scan- declines       CCS- declines     Objective:     Advanced Directives 10/18/2018 08/24/2017 08/07/2015 11/17/2013 02/16/2013 01/23/2013 01/18/2013  Does Patient Have a Medical Advance Directive? No Yes No No;Yes Patient has advance directive, copy not in chart - Patient has advance directive, copy not in chart  Type of Advance Directive - [place];Living will - [place];Living will - - [place];Living will  Does patient want to make changes to medical advance directive? - - - - - - No  Copy of Powers in Chart? - No - copy requested - No - copy requested - - -  Would patient like information on creating  a medical advance directive? No - Patient declined - Yes - Educational materials given No - patient declined information - - -  Pre-existing out of facility DNR order (yellow form or pink MOST form) - - - - - No -    Tobacco Social History   Tobacco Use  Smoking Status Former Smoker  . Packs/day: 1.00  . Years: 35.00  . Pack years: 35.00  . Types: Cigarettes  . Quit date: 07/15/2010  . Years since quitting: 8.2  Smokeless Tobacco Never Used     Counseling given: Not Answered   Clinical Intake: Pain : No/denies pain    Past Medical History:  Diagnosis Date  . Acute bronchitis with COPD ([place]) 03/19/2015  . Bladder cancer ([facility]) UROLOGIST-- DR Diona Fanti   HIGH-GRADE UROTHELIAL CARCINOMA  . Bladder tumor "multiple"   "some ORs; some removed in office"  . COPD (chronic obstructive pulmonary disease) ([place])   . Depression   . Diabetes mellitus type 2 in obese ([place]) 01/31/2015  . GERD (gastroesophageal reflux disease)   . H/O hiatal hernia   . High cholesterol   . History of blood transfusion 01/2013 - 02/2013   related to ORs  . Hypertension   . Iron deficiency anemia   . Murmur, heart 05/20/2014  . Pneumonia 07/2010; 08/07/2015  . Type 2 diabetes mellitus ([place])    Past Surgical History:  Procedure Laterality Date  . APPENDECTOMY  1980's  . CYSTOSCOPY N/A 12/18/2013  Procedure: CYSTOSCOPY;  Surgeon: Jorja Loa, MD;  Location: [facility];  Service: Urology;  Laterality: N/A;  . TRANSURETHRAL RESECTION OF BLADDER TUMOR N/A 02/16/2013   Procedure: TRANSURETHRAL RESECTION OF BLADDER TUMOR (TURBT);  Surgeon: Franchot Gallo, MD;  Location: [facility];  Service: Urology;  Laterality: N/A;  . TRANSURETHRAL RESECTION OF BLADDER TUMOR N/A 12/18/2013   Procedure: TRANSURETHRAL RESECTION OF BLADDER TUMOR (TURBT) ;  Surgeon: Jorja Loa, MD;  Location: [facility];  Service: Urology;  Laterality: N/A;  . TRANSURETHRAL  RESECTION OF BLADDER TUMOR WITH GYRUS (TURBT-GYRUS) N/A 01/23/2013   Procedure: TRANSURETHRAL RESECTION OF BLADDER TUMOR WITH GYRUS ;  Surgeon: Franchot Gallo, MD;  Location: [facility];  Service: Urology;  Laterality: N/A;  . TUBAL LIGATION  1980's   Family History  Problem Relation Age of Onset  . Cancer Father        prostate  . Arthritis Brother   . Frontotemporal dementia Brother    Social History   Socioeconomic History  . Marital status: Widowed    Spouse name: Not on file  . Number of children: Not on file  . Years of education: Not on file  . Highest education level: Not on file  Occupational History  . Not on file  Social Needs  . Financial resource strain: Not on file  . Food insecurity    Worry: Not on file    Inability: Not on file  . Transportation needs    Medical: Not on file    Non-medical: Not on file  Tobacco Use  . Smoking status: Former Smoker    Packs/day: 1.00    Years: 35.00    Pack years: 35.00    Types: Cigarettes    Quit date: 07/15/2010    Years since quitting: 8.2  . Smokeless tobacco: Never Used  Substance and Sexual Activity  . Alcohol use: Yes    Comment: wine socially   . Drug use: No  . Sexual activity: Not on file  Lifestyle  . Physical activity    Days per week: Not on file    Minutes per session: Not on file  . Stress: Not on file  Relationships  . Social Herbalist on phone: Not on file    Gets together: Not on file    Attends religious service: Not on file    Active member of club or organization: Not on file    Attends meetings of clubs or organizations: Not on file    Relationship status: Not on file  Other Topics Concern  . Not on file  Social History Narrative  . Not on file    Outpatient Encounter Medications as of 10/18/2018  Medication Sig  . albuterol (PROAIR HFA) 108 (90 Base) MCG/ACT inhaler Inhale 2 puffs into the lungs every 6 (six) hours as needed for wheezing or shortness of breath.  .  budesonide-formoterol (SYMBICORT) 160-4.5 MCG/ACT inhaler Inhale 2 puffs into the lungs 2 (two) times daily.  . Calcium Citrate-Vitamin D (CITRACAL + D PO) Take 2 tablets by mouth 2 (two) times daily.   . citalopram (CELEXA) 40 MG tablet Take 1 tablet by mouth once daily  . Coenzyme Q10 (COQ10) 200 MG CAPS Take 1 capsule by mouth daily.  Marland Kitchen guaiFENesin (MUCINEX) 600 MG 12 hr tablet Take 1 tablet (600 mg total) by mouth 2 (two) times daily.  Marland Kitchen losartan (COZAAR) 50 MG tablet TAKE 1 TABLET BY MOUTH  DAILY  .  MELATONIN PO Take 1 tablet by mouth at bedtime.  . metFORMIN (GLUCOPHAGE) 500 MG tablet TAKE 1 TABLET BY MOUTH TWO  TIMES DAILY WITH MEALS  . metoprolol succinate (TOPROL-XL) 25 MG 24 hr tablet TAKE 1 TABLET BY MOUTH  DAILY  . Multiple Vitamins-Minerals (ZINC PO) Take 1 tablet by mouth daily.  Marland Kitchen omeprazole (PRILOSEC) 20 MG capsule TAKE 1 CAPSULE BY MOUTH TWO TIMES DAILY AS NEEDED  . pravastatin (PRAVACHOL) 10 MG tablet TAKE 1 TABLET BY MOUTH  DAILY  . Probiotic Product (PROBIOTIC PO) Take 1 capsule by mouth daily.  Marland Kitchen tobramycin (TOBREX) 0.3 % ophthalmic solution Place 2 drops into the right eye every 6 (six) hours.  Marland Kitchen EPINEPHrine 0.3 mg/0.3 mL IJ SOAJ injection Inject 0.3 mLs (0.3 mg total) into the muscle once. (Patient not taking: Reported on 10/18/2018)  . ferrous sulfate (FEOSOL) 325 (65 FE) MG tablet Take 325 mg by mouth daily with breakfast.   . [DISCONTINUED] doxycycline (VIBRA-TABS) 100 MG tablet Take 1 tablet (100 mg total) by mouth 2 (two) times daily.  . [DISCONTINUED] methylPREDNISolone (MEDROL) 4 MG tablet 5 tab po qd X 1d then 4 tab po qd X 1d then 3 tab po qd X 1d then 2 tab po qd then 1 tab po qd   Facility-Administered Encounter Medications as of 10/18/2018  Medication  . mitoMYcin (MUTAMYCIN) chemo injection 40 mg    Activities of Daily Living No flowsheet data found.  Patient Care Team: [name], MD as PCP - General (Family Medicine) Franchot Gallo, MD as  Consulting Physician (Urology)    Assessment:   This is a routine wellness examination for [facility]. Physical assessment deferred to PCP.  Exercise Activities and Dietary recommendations   Diet (meal preparation, eat out, water intake, caffeinated beverages, dairy products, fruits and vegetables): well balanced, on average, 3 meals per day  Goals    . Increase physical activity     Restart swimming/water aerobics this summer.    . Increase physical activity     Go to the pool 2x/ week.       Fall Risk Fall Risk  10/18/2018 08/24/2017 10/09/2016 08/20/2016 07/15/2015  Falls in the past year? 0 No No No No  Comment - - Emmi Telephone Survey: data to providers prior to load - -    Depression Screen PHQ 2/9 Scores 10/18/2018 08/24/2017 08/20/2016 07/15/2015  PHQ - 2 Score 0 0 0 0     Cognitive Function Ad8 score reviewed for issues:  Issues making decisions:no  Less interest in hobbies / activities: no  Repeats questions, stories (family complaining):no  Trouble using ordinary gadgets (microwave, computer, phone):no  Forgets the month or year: no  Mismanaging finances: no  Remembering appts:no  Daily problems with thinking and/or memory:no Ad8 score is=0     MMSE - Mini Mental State Exam 08/20/2016  Orientation to time 5  Orientation to Place 5  Registration 3  Attention/ Calculation 5  Recall 3  Language- name 2 objects 2  Language- repeat 1  Language- follow 3 step command 3  Language- read & follow direction 1  Write a sentence 1  Copy design 1  Total score 30        Immunization History  Administered Date(s) Administered  . Influenza Whole 11/24/2010  . Influenza, Seasonal, Injecte, Preservative Fre 12/28/2014  . Influenza,inj,Quad PF,6+ Mos 11/17/2013  . Influenza-Unspecified 11/17/2012, 11/29/2015  . Pneumococcal Conjugate-13 11/17/2013  . Pneumococcal Polysaccharide-23 11/09/2011, 02/12/2017  . Tdap 11/24/2010  .  Zoster 03/17/2007  . Zoster Recombinat  (Shingrix) 10/02/2017, 11/29/2017    Screening Tests Health Maintenance  Topic Date Due  . FOOT EXAM  08/30/1958  . OPHTHALMOLOGY EXAM  08/30/1958  . MAMMOGRAM  11/23/2012  . DEXA SCAN  08/29/2013  . HEMOGLOBIN A1C  10/06/2018  . INFLUENZA VACCINE  10/15/2018  . COLONOSCOPY  11/23/2020  . TETANUS/TDAP  11/23/2020  . Hepatitis C Screening  Completed  . PNA vac Low Risk Adult  Completed        Plan:   See you next year!  Continue to eat heart healthy diet (full of fruits, vegetables, whole grains, lean protein, water--limit salt, fat, and sugar intake) and increase physical activity as tolerated.  Continue doing brain stimulating activities (puzzles, reading, adult coloring books, staying active) to keep memory sharp.     I have personally reviewed and noted the following in the patient's chart:   . Medical and social history . Use of alcohol, tobacco or illicit drugs  . Current medications and supplements . Functional ability and status . Nutritional status . Physical activity . Advanced directives . List of other physicians . Hospitalizations, surgeries, and ER visits in previous 12 months . Vitals . Screenings to include cognitive, depression, and falls . Referrals and appointments  In addition, I have reviewed and discussed with patient certain preventive protocols, quality metrics, and best practice recommendations. A written personalized care plan for preventive services as well as general preventive health recommendations were provided to patient.     Shela Nevin, [place]  10/18/2018

## 2018-10-18 ENCOUNTER — Other Ambulatory Visit: Payer: Self-pay

## 2018-10-18 ENCOUNTER — Ambulatory Visit (INDEPENDENT_AMBULATORY_CARE_PROVIDER_SITE_OTHER): Payer: Medicare Other | Admitting: *Deleted

## 2018-10-18 ENCOUNTER — Encounter: Payer: Self-pay | Admitting: *Deleted

## 2018-10-18 DIAGNOSIS — Z0000 Encounter for general adult medical examination without abnormal findings: Secondary | ICD-10-CM

## 2018-10-18 NOTE — Patient Instructions (Signed)
See you next year!  Continue to eat heart healthy diet (full of fruits, vegetables, whole grains, lean protein, water--limit salt, fat, and sugar intake) and increase physical activity as tolerated.  Continue doing brain stimulating activities (puzzles, reading, adult coloring books, staying active) to keep memory sharp.    Savannah Stanley , Thank you for taking time to come for your Medicare Wellness Visit. I appreciate your ongoing commitment to your health goals. Please review the following plan we discussed and let me know if I can assist you in the future.   These are the goals we discussed: Goals    . DIET - EAT MORE FRUITS AND VEGETABLES    . Increase physical activity     Restart swimming/water aerobics this summer.       This is a list of the screening recommended for you and due dates:  Health Maintenance  Topic Date Due  . Complete foot exam   08/30/1960  . Eye exam for diabetics  08/30/1958  . Mammogram  11/23/2012  . DEXA scan (bone density measurement)  08/29/2013  . Hemoglobin A1C  10/06/2018  . Flu Shot  10/15/2018  . Colon Cancer Screening  11/23/2020  . Tetanus Vaccine  11/23/2020  .  Hepatitis C: One time screening is recommended by Center for Disease Control  (CDC) for  adults born from [birth year] through 1967.   Completed  . Pneumonia vaccines  Completed    Health Maintenance After Age 15 After age 65, you are at a higher risk for certain long-term diseases and infections as well as injuries from falls. Falls are a major cause of broken bones and head injuries in people who are older than age 37. Getting regular preventive care can help to keep you healthy and well. Preventive care includes getting regular testing and making lifestyle changes as recommended by your health care provider. Talk with your health care provider about:  Which screenings and tests you should have. A screening is a test that checks for a disease when you have no symptoms.  A diet and exercise  plan that is right for you. What should I know about screenings and tests to prevent falls? Screening and testing are the best ways to find a health problem early. Early diagnosis and treatment give you the best chance of managing medical conditions that are common after age 8. Certain conditions and lifestyle choices may make you more likely to have a fall. Your health care provider may recommend:  Regular vision checks. Poor vision and conditions such as cataracts can make you more likely to have a fall. If you wear glasses, make sure to get your prescription updated if your vision changes.  Medicine review. Work with your health care provider to regularly review all of the medicines you are taking, including over-the-counter medicines. Ask your health care provider about any side effects that may make you more likely to have a fall. Tell your health care provider if any medicines that you take make you feel dizzy or sleepy.  Osteoporosis screening. Osteoporosis is a condition that causes the bones to get weaker. This can make the bones weak and cause them to break more easily.  Blood pressure screening. Blood pressure changes and medicines to control blood pressure can make you feel dizzy.  Strength and balance checks. Your health care provider may recommend certain tests to check your strength and balance while standing, walking, or changing positions.  Foot health exam. Foot pain and numbness, as well  as not wearing proper footwear, can make you more likely to have a fall.  Depression screening. You may be more likely to have a fall if you have a fear of falling, feel emotionally low, or feel unable to do activities that you used to do.  Alcohol use screening. Using too much alcohol can affect your balance and may make you more likely to have a fall. What actions can I take to lower my risk of falls? General instructions  Talk with your health care provider about your risks for falling.  Tell your health care provider if: ? You fall. Be sure to tell your health care provider about all falls, even ones that seem minor. ? You feel dizzy, sleepy, or off-balance.  Take over-the-counter and prescription medicines only as told by your health care provider. These include any supplements.  Eat a healthy diet and maintain a healthy weight. A healthy diet includes low-fat dairy products, low-fat (lean) meats, and fiber from whole grains, beans, and lots of fruits and vegetables. Home safety  Remove any tripping hazards, such as rugs, cords, and clutter.  Install safety equipment such as grab bars in bathrooms and safety rails on stairs.  Keep rooms and walkways well-lit. Activity   Follow a regular exercise program to stay fit. This will help you maintain your balance. Ask your health care provider what types of exercise are appropriate for you.  If you need a cane or walker, use it as recommended by your health care provider.  Wear supportive shoes that have nonskid soles. Lifestyle  Do not drink alcohol if your health care provider tells you not to drink.  If you drink alcohol, limit how much you have: ? 0-1 drink a day for women. ? 0-2 drinks a day for men.  Be aware of how much alcohol is in your drink. In the [place] one drink equals one typical bottle of beer (12 oz), one-half glass of wine (5 oz), or one shot of hard liquor (1 oz).  Do not use any products that contain nicotine or tobacco, such as cigarettes and e-cigarettes. If you need help quitting, ask your health care provider. Summary  Having a healthy lifestyle and getting preventive care can help to protect your health and wellness after age 4.  Screening and testing are the best way to find a health problem early and help you avoid having a fall. Early diagnosis and treatment give you the best chance for managing medical conditions that are more common for people who are older than age 52.  Falls are a  major cause of broken bones and head injuries in people who are older than age 30. Take precautions to prevent a fall at home.  Work with your health care provider to learn what changes you can make to improve your health and wellness and to prevent falls. This information is not intended to replace advice given to you by your health care provider. Make sure you discuss any questions you have with your health care provider. Document Released: 01/13/2017 Document Revised: 06/23/2018 Document Reviewed: 01/13/2017 Elsevier Patient Education  2020 [employer].

## 2018-10-25 ENCOUNTER — Other Ambulatory Visit: Payer: Self-pay | Admitting: Family Medicine

## 2018-10-26 NOTE — Telephone Encounter (Signed)
Dr Charlett Blake -- pt has appt with you on 11/03/18. [facility] is requesting Symbicort refills for up to 1 yr. Is that ok with you?

## 2018-10-27 ENCOUNTER — Other Ambulatory Visit (INDEPENDENT_AMBULATORY_CARE_PROVIDER_SITE_OTHER): Payer: Medicare Other

## 2018-10-27 ENCOUNTER — Other Ambulatory Visit: Payer: Self-pay

## 2018-10-27 DIAGNOSIS — E782 Mixed hyperlipidemia: Secondary | ICD-10-CM

## 2018-10-27 DIAGNOSIS — I10 Essential (primary) hypertension: Secondary | ICD-10-CM | POA: Diagnosis not present

## 2018-10-27 DIAGNOSIS — E669 Obesity, unspecified: Secondary | ICD-10-CM | POA: Diagnosis not present

## 2018-10-27 DIAGNOSIS — E1169 Type 2 diabetes mellitus with other specified complication: Secondary | ICD-10-CM | POA: Diagnosis not present

## 2018-10-27 LAB — CBC
HCT: 39.3 % (ref 36.0–46.0)
Hemoglobin: 12.6 g/dL (ref 12.0–15.0)
MCHC: 32.1 g/dL (ref 30.0–36.0)
MCV: 84.2 fl (ref 78.0–100.0)
Platelets: 240 10*3/uL (ref 150.0–400.0)
RBC: 4.66 Mil/uL (ref 3.87–5.11)
RDW: 15.4 % (ref 11.5–15.5)
WBC: 7.2 10*3/uL (ref 4.0–10.5)

## 2018-10-27 LAB — COMPREHENSIVE METABOLIC PANEL
ALT: 9 U/L (ref 0–35)
AST: 13 U/L (ref 0–37)
Albumin: 4.1 g/dL (ref 3.5–5.2)
Alkaline Phosphatase: 53 U/L (ref 39–117)
BUN: 14 mg/dL (ref 6–23)
CO2: 28 mEq/L (ref 19–32)
Calcium: 9.1 mg/dL (ref 8.4–10.5)
Chloride: 98 mEq/L (ref 96–112)
Creatinine, Ser: 0.61 mg/dL (ref 0.40–1.20)
GFR: 96.92 mL/min (ref >60.00)
Glucose, Bld: 129 mg/dL — ABNORMAL HIGH (ref 70–99)
Potassium: 4.1 mEq/L (ref 3.5–5.1)
Sodium: 135 mEq/L (ref 135–145)
Total Bilirubin: 0.3 mg/dL (ref 0.2–1.2)
Total Protein: 6.2 g/dL (ref 6.0–8.3)

## 2018-10-27 LAB — HEMOGLOBIN A1C: Hgb A1c MFr Bld: 7.5 % — ABNORMAL HIGH (ref 4.6–6.5)

## 2018-10-27 LAB — LIPID PANEL
Cholesterol: 162 mg/dL (ref 0–200)
HDL: 56.8 mg/dL (ref >39.00)
LDL Cholesterol: 76 mg/dL (ref 0–99)
NonHDL: 105.49
Total CHOL/HDL Ratio: 3
Triglycerides: 148 mg/dL (ref 0.0–149.0)
VLDL: 29.6 mg/dL (ref 0.0–40.0)

## 2018-10-27 LAB — TSH: TSH: 1.04 u[IU]/mL (ref 0.35–4.50)

## 2018-11-03 ENCOUNTER — Other Ambulatory Visit: Payer: Self-pay | Admitting: Family Medicine

## 2018-11-03 ENCOUNTER — Other Ambulatory Visit: Payer: Self-pay

## 2018-11-03 ENCOUNTER — Encounter: Payer: Self-pay | Admitting: Family Medicine

## 2018-11-03 ENCOUNTER — Ambulatory Visit (INDEPENDENT_AMBULATORY_CARE_PROVIDER_SITE_OTHER): Payer: Medicare Other | Admitting: Family Medicine

## 2018-11-03 VITALS — BP 122/60 | HR 85 | Temp 96.9°F | Resp 18 | Wt 200.8 lb

## 2018-11-03 DIAGNOSIS — E669 Obesity, unspecified: Secondary | ICD-10-CM | POA: Diagnosis not present

## 2018-11-03 DIAGNOSIS — Z23 Encounter for immunization: Secondary | ICD-10-CM

## 2018-11-03 DIAGNOSIS — J441 Chronic obstructive pulmonary disease with (acute) exacerbation: Secondary | ICD-10-CM

## 2018-11-03 DIAGNOSIS — E1169 Type 2 diabetes mellitus with other specified complication: Secondary | ICD-10-CM | POA: Diagnosis not present

## 2018-11-03 DIAGNOSIS — E782 Mixed hyperlipidemia: Secondary | ICD-10-CM

## 2018-11-03 DIAGNOSIS — I10 Essential (primary) hypertension: Secondary | ICD-10-CM | POA: Diagnosis not present

## 2018-11-03 MED ORDER — METFORMIN HCL 500 MG PO TABS: ORAL_TABLET | ORAL | 1 refills | Status: DC

## 2018-11-03 NOTE — Assessment & Plan Note (Signed)
Well controlled, no changes to meds. Encouraged heart healthy diet such as the DASH diet and exercise as tolerated.  °

## 2018-11-03 NOTE — Assessment & Plan Note (Signed)
No recent exacerbation 

## 2018-11-03 NOTE — Patient Instructions (Signed)
Pulse Oximeter  Omron blood pressure cuff, upper arm  Weekly vitals  High Cholesterol  High cholesterol is a condition in which the blood has high levels of a white, waxy, fat-like substance (cholesterol). The human body needs small amounts of cholesterol. The liver makes all the cholesterol that the body needs. Extra (excess) cholesterol comes from the food that we eat. Cholesterol is carried from the liver by the blood through the blood vessels. If you have high cholesterol, deposits (plaques) may build up on the walls of your blood vessels (arteries). Plaques make the arteries narrower and stiffer. Cholesterol plaques increase your risk for heart attack and stroke. Work with your health care provider to keep your cholesterol levels in a healthy range. What increases the risk? This condition is more likely to develop in people who:  Eat foods that are high in animal fat (saturated fat) or cholesterol.  Are overweight.  Are not getting enough exercise.  Have a family history of high cholesterol. What are the signs or symptoms? There are no symptoms of this condition. How is this diagnosed? This condition may be diagnosed from the results of a blood test.  If you are older than age 73, your health care provider may check your cholesterol every 4-6 years.  You may be checked more often if you already have high cholesterol or other risk factors for heart disease. The blood test for cholesterol measures:  "Bad" cholesterol (LDL cholesterol). This is the main type of cholesterol that causes heart disease. The desired level for LDL is less than 100.  "Good" cholesterol (HDL cholesterol). This type helps to protect against heart disease by cleaning the arteries and carrying the LDL away. The desired level for HDL is 60 or higher.  Triglycerides. These are fats that the body can store or burn for energy. The desired number for triglycerides is lower than 150.  Total cholesterol. This is a  measure of the total amount of cholesterol in your blood, including LDL cholesterol, HDL cholesterol, and triglycerides. A healthy number is less than 200. How is this treated? This condition is treated with diet changes, lifestyle changes, and medicines. Diet changes  This may include eating more whole grains, fruits, vegetables, nuts, and fish.  This may also include cutting back on red meat and foods that have a lot of added sugar. Lifestyle changes  Changes may include getting at least 40 minutes of aerobic exercise 3 times a week. Aerobic exercises include walking, biking, and swimming. Aerobic exercise along with a healthy diet can help you maintain a healthy weight.  Changes may also include quitting smoking. Medicines  Medicines are usually given if diet and lifestyle changes have failed to reduce your cholesterol to healthy levels.  Your health care provider may prescribe a statin medicine. Statin medicines have been shown to reduce cholesterol, which can reduce the risk of heart disease. Follow these instructions at home: Eating and drinking If told by your health care provider:  Eat chicken (without skin), fish, veal, shellfish, ground [place] breast, and round or loin cuts of red meat.  Do not eat fried foods or fatty meats, such as hot dogs and salami.  Eat plenty of fruits, such as apples.  Eat plenty of vegetables, such as broccoli, potatoes, and carrots.  Eat beans, peas, and lentils.  Eat grains such as barley, rice, couscous, and bulgur wheat.  Eat pasta without cream sauces.  Use skim or nonfat milk, and eat low-fat or nonfat yogurt and cheeses.  Do not eat or drink whole milk, cream, ice cream, egg yolks, or hard cheeses.  Do not eat stick margarine or tub margarines that contain trans fats (also called partially hydrogenated oils).  Do not eat saturated tropical oils, such as coconut oil and palm oil.  Do not eat cakes, cookies, crackers, or other baked  goods that contain trans fats.  General instructions  Exercise as directed by your health care provider. Increase your activity level with activities such as gardening, walking, and taking the stairs.  Take over-the-counter and prescription medicines only as told by your health care provider.  Do not use any products that contain nicotine or tobacco, such as cigarettes and e-cigarettes. If you need help quitting, ask your health care provider.  Keep all follow-up visits as told by your health care provider. This is important. Contact a health care provider if:  You are struggling to maintain a healthy diet or weight.  You need help to start on an exercise program.  You need help to stop smoking. Get help right away if:  You have chest pain.  You have trouble breathing. This information is not intended to replace advice given to you by your health care provider. Make sure you discuss any questions you have with your health care provider. Document Released: 03/02/2005 Document Revised: 03/05/2017 Document Reviewed: 08/31/2015 Elsevier Patient Education  2020 [employer].

## 2018-11-03 NOTE — Assessment & Plan Note (Signed)
hgba1c acceptable, minimize simple carbs. Increase exercise as tolerated. Continue current meds 

## 2018-11-03 NOTE — Assessment & Plan Note (Signed)
Encouraged heart healthy diet, increase exercise, avoid trans fats, consider a krill oil cap daily 

## 2018-11-06 NOTE — Progress Notes (Signed)
Subjective:    Patient ID: Savannah Stanley, female    DOB: August 20, 1950, 68 y.o.   MRN: [id number]  Chief Complaint  Patient presents with  . Follow-up    HPI Patient is in today for follow up on chronic medical concerns including reflux, diabetes and more. She feels well today. No recent febrile illness or hospitalizations. No polyuria or polydipsia. Denies CP/palp/SOB/HA/congestion/fevers/GI or GU c/o. Taking meds as prescribed. Maintaining quarantine well  Past Medical History:  Diagnosis Date  . Acute bronchitis with COPD ([place]) 03/19/2015  . Bladder cancer (H[facility]) UROLOGIST-- DR Diona Fanti   HIGH-GRADE UROTHELIAL CARCINOMA  . Bladder tumor "multiple"   "some ORs; some removed in office"  . COPD (chronic obstructive pulmonary disease) ([place])   . Depression   . Diabetes mellitus type 2 in obese ([place]) 01/31/2015  . GERD (gastroesophageal reflux disease)   . H/O hiatal hernia   . High cholesterol   . History of blood transfusion 01/2013 - 02/2013   related to ORs  . Hypertension   . Iron deficiency anemia   . Murmur, heart 05/20/2014  . Pneumonia 07/2010; 08/07/2015  . Type 2 diabetes mellitus ([place])     Past Surgical History:  Procedure Laterality Date  . APPENDECTOMY  1980's  . CYSTOSCOPY N/A 12/18/2013   Procedure: CYSTOSCOPY;  Surgeon: Jorja Loa, MD;  Location: [facility];  Service: Urology;  Laterality: N/A;  . TRANSURETHRAL RESECTION OF BLADDER TUMOR N/A 02/16/2013   Procedure: TRANSURETHRAL RESECTION OF BLADDER TUMOR (TURBT);  Surgeon: Franchot Gallo, MD;  Location: [facility];  Service: Urology;  Laterality: N/A;  . TRANSURETHRAL RESECTION OF BLADDER TUMOR N/A 12/18/2013   Procedure: TRANSURETHRAL RESECTION OF BLADDER TUMOR (TURBT) ;  Surgeon: Jorja Loa, MD;  Location: [facility];  Service: Urology;  Laterality: N/A;  . TRANSURETHRAL RESECTION OF BLADDER TUMOR WITH GYRUS (TURBT-GYRUS) N/A 01/23/2013   Procedure: TRANSURETHRAL RESECTION OF BLADDER TUMOR WITH GYRUS ;  Surgeon: Franchot Gallo, MD;  Location: [facility];  Service: Urology;  Laterality: N/A;  . TUBAL LIGATION  1980's    Family History  Problem Relation Age of Onset  . Cancer Father        prostate  . Arthritis Brother   . Frontotemporal dementia Brother     Social History   Socioeconomic History  . Marital status: Widowed    Spouse name: Not on file  . Number of children: Not on file  . Years of education: Not on file  . Highest education level: Not on file  Occupational History  . Not on file  Social Needs  . Financial resource strain: Not on file  . Food insecurity    Worry: Not on file    Inability: Not on file  . Transportation needs    Medical: Not on file    Non-medical: Not on file  Tobacco Use  . Smoking status: Former Smoker    Packs/day: 1.00    Years: 35.00    Pack years: 35.00    Types: Cigarettes    Quit date: 07/15/2010    Years since quitting: 8.3  . Smokeless tobacco: Never Used  Substance and Sexual Activity  . Alcohol use: Yes    Comment: wine socially   . Drug use: No  . Sexual activity: Not on file  Lifestyle  . Physical activity    Days per week: Not on file    Minutes per session: Not on file  . Stress:  Not on file  Relationships  . Social Herbalist on phone: Not on file    Gets together: Not on file    Attends religious service: Not on file    Active member of club or organization: Not on file    Attends meetings of clubs or organizations: Not on file    Relationship status: Not on file  . Intimate partner violence    Fear of current or ex partner: Not on file    Emotionally abused: Not on file    Physically abused: Not on file    Forced sexual activity: Not on file  Other Topics Concern  . Not on file  Social History Narrative  . Not on file    Outpatient Medications Prior to Visit  Medication Sig Dispense Refill  . albuterol (PROAIR HFA) 108 (90  Base) MCG/ACT inhaler Inhale 2 puffs into the lungs every 6 (six) hours as needed for wheezing or shortness of breath.    . Calcium Citrate-Vitamin D (CITRACAL + D PO) Take 2 tablets by mouth 2 (two) times daily.     . citalopram (CELEXA) 40 MG tablet Take 1 tablet by mouth once daily 90 tablet 0  . Coenzyme Q10 (COQ10) 200 MG CAPS Take 1 capsule by mouth daily.    Marland Kitchen EPINEPHrine 0.3 mg/0.3 mL IJ SOAJ injection Inject 0.3 mLs (0.3 mg total) into the muscle once. (Patient not taking: Reported on 10/18/2018) 1 Device 1  . ferrous sulfate (FEOSOL) 325 (65 FE) MG tablet Take 325 mg by mouth daily with breakfast.     . guaiFENesin (MUCINEX) 600 MG 12 hr tablet Take 1 tablet (600 mg total) by mouth 2 (two) times daily. 20 tablet 0  . losartan (COZAAR) 50 MG tablet TAKE 1 TABLET BY MOUTH  DAILY 90 tablet 1  . MELATONIN PO Take 1 tablet by mouth at bedtime.    . metoprolol succinate (TOPROL-XL) 25 MG 24 hr tablet TAKE 1 TABLET BY MOUTH  DAILY 90 tablet 3  . Multiple Vitamins-Minerals (ZINC PO) Take 1 tablet by mouth daily.    Marland Kitchen omeprazole (PRILOSEC) 20 MG capsule TAKE 1 CAPSULE BY MOUTH TWO TIMES DAILY AS NEEDED 180 capsule 0  . pravastatin (PRAVACHOL) 10 MG tablet TAKE 1 TABLET BY MOUTH  DAILY 90 tablet 1  . Probiotic Product (PROBIOTIC PO) Take 1 capsule by mouth daily.    . SYMBICORT 160-4.5 MCG/ACT inhaler USE 2 INHALATIONS BY MOUTH  TWICE DAILY 30.6 g 3  . tobramycin (TOBREX) 0.3 % ophthalmic solution Place 2 drops into the right eye every 6 (six) hours. 5 mL 0  . metFORMIN (GLUCOPHAGE) 500 MG tablet TAKE 1 TABLET BY MOUTH TWO  TIMES DAILY WITH MEALS 180 tablet 3   Facility-Administered Medications Prior to Visit  Medication Dose Route Frequency Provider Last Rate Last Dose  . mitoMYcin (MUTAMYCIN) chemo injection 40 mg  40 mg Bladder Instillation Once Franchot Gallo, MD        Allergies  Allergen Reactions  . Bee Venom Shortness Of Breath  . Other Shortness Of Breath    MSG.  . Latex  Itching    Review of Systems  Constitutional: Negative for fever and malaise/fatigue.  HENT: Negative for congestion.   Eyes: Negative for blurred vision.  Respiratory: Negative for shortness of breath.   Cardiovascular: Negative for chest pain, palpitations and leg swelling.  Gastrointestinal: Negative for abdominal pain, blood in stool and nausea.  Genitourinary: Negative for dysuria and  frequency.  Musculoskeletal: Negative for falls.  Skin: Negative for rash.  Neurological: Negative for dizziness, loss of consciousness and headaches.  Endo/Heme/Allergies: Negative for environmental allergies.  Psychiatric/Behavioral: Negative for depression. The patient is not nervous/anxious.        Objective:    Physical Exam Vitals signs and nursing note reviewed.  Constitutional:      General: She is not in acute distress.    Appearance: She is well-developed.  HENT:     Head: Normocephalic and atraumatic.     Nose: Nose normal.  Eyes:     General:        Right eye: No discharge.        Left eye: No discharge.  Neck:     Musculoskeletal: Normal range of motion and neck supple.  Cardiovascular:     Rate and Rhythm: Normal rate and regular rhythm.     Heart sounds: No murmur.  Pulmonary:     Effort: Pulmonary effort is normal.     Breath sounds: Normal breath sounds.  Abdominal:     General: Bowel sounds are normal.     Palpations: Abdomen is soft.     Tenderness: There is no abdominal tenderness.  Skin:    General: Skin is warm and dry.  Neurological:     Mental Status: She is alert and oriented to person, place, and time.     BP 122/60 (BP Location: Left Arm, Patient Position: Sitting, Cuff Size: Normal)   Pulse 85   Temp (!) 96.9 F (36.1 C) (Oral)   Resp 18   Wt 200 lb 12.8 oz (91.1 kg)   SpO2 92%   BMI 35.57 kg/m  Wt Readings from Last 3 Encounters:  11/03/18 200 lb 12.8 oz (91.1 kg)  04/07/18 206 lb 3.2 oz (93.5 kg)  10/01/17 206 lb 12.8 oz (93.8 kg)     Diabetic Foot Exam - Simple   No data filed     Lab Results  Component Value Date   WBC 7.2 10/27/2018   HGB 12.6 10/27/2018   HCT 39.3 10/27/2018   PLT 240.0 10/27/2018   GLUCOSE 129 (H) 10/27/2018   CHOL 162 10/27/2018   TRIG 148.0 10/27/2018   HDL 56.80 10/27/2018   LDLCALC 76 10/27/2018   ALT 9 10/27/2018   AST 13 10/27/2018   NA 135 10/27/2018   K 4.1 10/27/2018   CL 98 10/27/2018   CREATININE 0.61 10/27/2018   BUN 14 10/27/2018   CO2 28 10/27/2018   TSH 1.04 10/27/2018   INR 1.04 01/18/2013   HGBA1C 7.5 (H) 10/27/2018   MICROALBUR <0.7 07/15/2015    Lab Results  Component Value Date   TSH 1.04 10/27/2018   Lab Results  Component Value Date   WBC 7.2 10/27/2018   HGB 12.6 10/27/2018   HCT 39.3 10/27/2018   MCV 84.2 10/27/2018   PLT 240.0 10/27/2018   Lab Results  Component Value Date   NA 135 10/27/2018   K 4.1 10/27/2018   CO2 28 10/27/2018   GLUCOSE 129 (H) 10/27/2018   BUN 14 10/27/2018   CREATININE 0.61 10/27/2018   BILITOT 0.3 10/27/2018   ALKPHOS 53 10/27/2018   AST 13 10/27/2018   ALT 9 10/27/2018   PROT 6.2 10/27/2018   ALBUMIN 4.1 10/27/2018   CALCIUM 9.1 10/27/2018   ANIONGAP 9 08/09/2015   GFR 96.92 10/27/2018   Lab Results  Component Value Date   CHOL 162 10/27/2018   Lab Results  Component  Value Date   HDL 56.80 10/27/2018   Lab Results  Component Value Date   LDLCALC 76 10/27/2018   Lab Results  Component Value Date   TRIG 148.0 10/27/2018   Lab Results  Component Value Date   CHOLHDL 3 10/27/2018   Lab Results  Component Value Date   HGBA1C 7.5 (H) 10/27/2018       Assessment & Plan:   Problem List Items Addressed This Visit    COPD (chronic obstructive pulmonary disease) ([place])    No recent exacerbation      Hypertension    Well controlled, no changes to meds. Encouraged heart healthy diet such as the DASH diet and exercise as tolerated.       Hyperlipidemia    Encouraged heart healthy diet, increase  exercise, avoid trans fats, consider a krill oil cap daily      Diabetes mellitus type 2 in obese (HCC)    hgba1c acceptable, minimize simple carbs. Increase exercise as tolerated. Continue current meds      Relevant Medications   metFORMIN (GLUCOPHAGE) 500 MG tablet    Other Visit Diagnoses    Flu vaccine need    -  Primary   Relevant Orders   Flu Vaccine QUAD High Dose(Fluad) (Completed)      I have changed Savannah Stanley "Holly"'s metFORMIN. I am also having her maintain her ferrous sulfate, Calcium Citrate-Vitamin D (CITRACAL + D PO), Multiple Vitamins-Minerals (ZINC PO), CoQ10, EPINEPHrine, guaiFENesin, Probiotic Product (PROBIOTIC PO), MELATONIN PO, albuterol, metoprolol succinate, losartan, pravastatin, citalopram, tobramycin, omeprazole, and Symbicort.  Meds ordered this encounter  Medications  . metFORMIN (GLUCOPHAGE) 500 MG tablet    Sig: 2 tabs po q am and 1 tab po q pm    Dispense:  270 tablet    Refill:  1    Requesting 1 year supply     Penni Homans, MD

## 2018-12-14 ENCOUNTER — Other Ambulatory Visit: Payer: Self-pay | Admitting: Family Medicine

## 2018-12-27 ENCOUNTER — Other Ambulatory Visit: Payer: Self-pay | Admitting: Family Medicine

## 2019-01-19 ENCOUNTER — Other Ambulatory Visit: Payer: Self-pay | Admitting: Family Medicine

## 2019-03-09 ENCOUNTER — Other Ambulatory Visit: Payer: Self-pay | Admitting: Family Medicine

## 2019-03-14 ENCOUNTER — Other Ambulatory Visit: Payer: Self-pay | Admitting: Family Medicine

## 2019-04-13 ENCOUNTER — Encounter: Payer: Self-pay | Admitting: Family Medicine

## 2019-05-11 ENCOUNTER — Ambulatory Visit: Payer: Medicare Other | Admitting: Family Medicine

## 2019-05-12 ENCOUNTER — Other Ambulatory Visit: Payer: Self-pay

## 2019-05-12 ENCOUNTER — Encounter: Payer: Self-pay | Admitting: Family Medicine

## 2019-05-12 ENCOUNTER — Ambulatory Visit (INDEPENDENT_AMBULATORY_CARE_PROVIDER_SITE_OTHER): Payer: Medicare Other | Admitting: Family Medicine

## 2019-05-12 DIAGNOSIS — J441 Chronic obstructive pulmonary disease with (acute) exacerbation: Secondary | ICD-10-CM | POA: Diagnosis not present

## 2019-05-12 DIAGNOSIS — I10 Essential (primary) hypertension: Secondary | ICD-10-CM | POA: Diagnosis not present

## 2019-05-12 DIAGNOSIS — E1169 Type 2 diabetes mellitus with other specified complication: Secondary | ICD-10-CM

## 2019-05-12 DIAGNOSIS — E782 Mixed hyperlipidemia: Secondary | ICD-10-CM | POA: Diagnosis not present

## 2019-05-12 DIAGNOSIS — E669 Obesity, unspecified: Secondary | ICD-10-CM

## 2019-05-12 NOTE — Patient Instructions (Signed)
Omron Blood Pressure cuff, upper arm, want BP 100-140/60-90 Pulse oximeter, want oxygen in 90s  Weekly vitals  Take Multivitamin with minerals, selenium Vitamin D 1000-2000 IU daily Probiotic with lactobacillus and bifidophilus Asprin EC 81 mg daily  Melatonin 2-5 mg at bedtime  [email or web address] 

## 2019-05-12 NOTE — Assessment & Plan Note (Signed)
Encouraged heart healthy diet, increase exercise, avoid trans fats, consider a krill oil cap daily. Pravastatin is tolerated

## 2019-05-12 NOTE — Assessment & Plan Note (Signed)
Doing well no recent exacerbation.

## 2019-05-12 NOTE — Assessment & Plan Note (Signed)
Well controlled, no changes to meds. Encouraged heart healthy diet such as the DASH diet and exercise as tolerated.  °

## 2019-05-12 NOTE — Assessment & Plan Note (Signed)
hgba1c acceptable, minimize simple carbs. Increase exercise as tolerated. Continue current meds 

## 2019-05-14 NOTE — Progress Notes (Signed)
Virtual Visit via phone Note  I connected with Savannah Stanley on 05/12/19 at  8:40 AM EST by a phone enabled telemedicine application and verified that I am speaking with the correct person using two identifiers.  Location: Patient: home Provider: home   I discussed the limitations of evaluation and management by telemedicine and the availability of in person appointments. The patient expressed understanding and agreed to proceed. Savannah Stanley, CMA was able to set the patient up on a phone visit after being unable to set up a video visit.   Subjective:    Patient ID: Savannah Stanley, female    DOB: [birth year], 69 y.o.   MRN: [id number]  No chief complaint on file.   HPI Patient is in today for follow up on chronic medical concerns. No recent febrile illness or acute hospitalizations. She has been maintaining quarantine well. No polyuria or polydipsia. She is trying to maintain a heart healthy diet and stay active. Denies CP/palp/SOB/HA/congestion/fevers/GI or GU c/o. Taking meds as prescribed  Past Medical History:  Diagnosis Date  . Acute bronchitis with COPD ([place]) 03/19/2015  . Bladder cancer (HCC) UROLOGIST-- DR Diona Fanti   HIGH-GRADE UROTHELIAL CARCINOMA  . Bladder tumor "multiple"   "some ORs; some removed in office"  . COPD (chronic obstructive pulmonary disease) ([place])   . Depression   . Diabetes mellitus type 2 in obese ([place]) 01/31/2015  . GERD (gastroesophageal reflux disease)   . H/O hiatal hernia   . High cholesterol   . History of blood transfusion 01/2013 - 02/2013   related to ORs  . Hypertension   . Iron deficiency anemia   . Murmur, heart 05/20/2014  . Pneumonia 07/2010; 08/07/2015  . Type 2 diabetes mellitus ([place])     Past Surgical History:  Procedure Laterality Date  . APPENDECTOMY  1980's  . CYSTOSCOPY N/A 12/18/2013   Procedure: CYSTOSCOPY;  Surgeon: Jorja Loa, MD;  Location[facility];  Service: Urology;  Laterality: N/A;  .  TRANSURETHRAL RESECTION OF BLADDER TUMOR N/A 02/16/2013   Procedure: TRANSURETHRAL RESECTION OF BLADDER TUMOR (TURBT);  Surgeon: Franchot Gallo, MD;  Location: [facility];  Service: Urology;  Laterality: N/A;  . TRANSURETHRAL RESECTION OF BLADDER TUMOR N/A 12/18/2013   Procedure: TRANSURETHRAL RESECTION OF BLADDER TUMOR (TURBT) ;  Surgeon: Jorja Loa, MD;  Location: [facility];  Service: Urology;  Laterality: N/A;  . TRANSURETHRAL RESECTION OF BLADDER TUMOR WITH GYRUS (TURBT-GYRUS) N/A 01/23/2013   Procedure: TRANSURETHRAL RESECTION OF BLADDER TUMOR WITH GYRUS ;  Surgeon: Franchot Gallo, MD;  Location: [facility];  Service: Urology;  Laterality: N/A;  . TUBAL LIGATION  1980's    Family History  Problem Relation Age of Onset  . Cancer Father        prostate  . Arthritis Brother   . Frontotemporal dementia Brother   . Dementia Brother     Social History   Socioeconomic History  . Marital status: Widowed    Spouse name: Not on file  . Number of children: Not on file  . Years of education: Not on file  . Highest education level: Not on file  Occupational History  . Not on file  Tobacco Use  . Smoking status: Former Smoker    Packs/day: 1.00    Years: 35.00    Pack years: 35.00    Types: Cigarettes    Quit date: 07/15/2010    Years since quitting: 8.8  . Smokeless tobacco: Never Used  Substance and Sexual Activity  . Alcohol use: Yes    Comment: wine socially   . Drug use: No  . Sexual activity: Not on file  Other Topics Concern  . Not on file  Social History Narrative  . Not on file   Social Determinants of Health   Financial Resource Strain:   . Difficulty of Paying Living Expenses: Not on file  Food Insecurity:   . Worried About Charity fundraiser in the Last Year: Not on file  . Ran Out of Food in the Last Year: Not on file  Transportation Needs:   . Lack of Transportation (Medical): Not on file  . Lack of Transportation  (Non-Medical): Not on file  Physical Activity:   . Days of Exercise per Week: Not on file  . Minutes of Exercise per Session: Not on file  Stress:   . Feeling of Stress : Not on file  Social Connections:   . Frequency of Communication with Friends and Family: Not on file  . Frequency of Social Gatherings with Friends and Family: Not on file  . Attends Religious Services: Not on file  . Active Member of Clubs or Organizations: Not on file  . Attends Archivist Meetings: Not on file  . Marital Status: Not on file  Intimate Partner Violence:   . Fear of Current or Ex-Partner: Not on file  . Emotionally Abused: Not on file  . Physically Abused: Not on file  . Sexually Abused: Not on file    Outpatient Medications Prior to Visit  Medication Sig Dispense Refill  . albuterol (PROAIR HFA) 108 (90 Base) MCG/ACT inhaler Inhale 2 puffs into the lungs every 6 (six) hours as needed for wheezing or shortness of breath.    . Calcium Citrate-Vitamin D (CITRACAL + D PO) Take 2 tablets by mouth 2 (two) times daily.     . citalopram (CELEXA) 40 MG tablet Take 1 tablet by mouth once daily 90 tablet 0  . Coenzyme Q10 (COQ10) 200 MG CAPS Take 1 capsule by mouth daily.    . ferrous sulfate (FEOSOL) 325 (65 FE) MG tablet Take 325 mg by mouth daily with breakfast.     . guaiFENesin (MUCINEX) 600 MG 12 hr tablet Take 1 tablet (600 mg total) by mouth 2 (two) times daily. 20 tablet 0  . losartan (COZAAR) 50 MG tablet TAKE 1 TABLET BY MOUTH  DAILY 90 tablet 3  . MELATONIN PO Take 1 tablet by mouth at bedtime.    . metFORMIN (GLUCOPHAGE) 500 MG tablet TAKE 2 TABLETS BY MOUTH IN  THE MORNING AND 1 TABLET IN THE EVENING 270 tablet 3  . metoprolol succinate (TOPROL-XL) 25 MG 24 hr tablet TAKE 1 TABLET BY MOUTH  DAILY 90 tablet 3  . Multiple Vitamins-Minerals (ZINC PO) Take 1 tablet by mouth daily.    Marland Kitchen omeprazole (PRILOSEC) 20 MG capsule TAKE 1 CAPSULE BY MOUTH TWO TIMES DAILY AS NEEDED 180 capsule 3  .  pravastatin (PRAVACHOL) 10 MG tablet TAKE 1 TABLET BY MOUTH  DAILY 90 tablet 3  . Probiotic Product (PROBIOTIC PO) Take 1 capsule by mouth daily.    . SYMBICORT 160-4.5 MCG/ACT inhaler USE 2 INHALATIONS BY MOUTH  TWICE DAILY 30.6 g 3  . tobramycin (TOBREX) 0.3 % ophthalmic solution Place 2 drops into the right eye every 6 (six) hours. 5 mL 0  . EPINEPHrine 0.3 mg/0.3 mL IJ SOAJ injection Inject 0.3 mLs (0.3 mg total) into the muscle  once. (Patient not taking: Reported on 10/18/2018) 1 Device 1   Facility-Administered Medications Prior to Visit  Medication Dose Route Frequency Provider Last Rate Last Admin  . mitoMYcin (MUTAMYCIN) chemo injection 40 mg  40 mg Bladder Instillation Once Franchot Gallo, MD        Allergies  Allergen Reactions  . Bee Venom Shortness Of Breath  . Other Shortness Of Breath    MSG.  . Latex Itching    Review of Systems  Constitutional: Negative for fever and malaise/fatigue.  HENT: Negative for congestion.   Eyes: Negative for blurred vision.  Respiratory: Negative for shortness of breath.   Cardiovascular: Negative for chest pain, palpitations and leg swelling.  Gastrointestinal: Negative for abdominal pain, blood in stool and nausea.  Genitourinary: Negative for dysuria and frequency.  Musculoskeletal: Negative for falls.  Skin: Negative for rash.  Neurological: Negative for dizziness, loss of consciousness and headaches.  Endo/Heme/Allergies: Negative for environmental allergies.  Psychiatric/Behavioral: Negative for depression. The patient is not nervous/anxious.        Objective:    Physical Exam unable to obtain via phone visit.   BP 129/84 (BP Location: Left Arm, Patient Position: Sitting, Cuff Size: Normal)   Wt 200 lb (90.7 kg)   BMI 35.43 kg/m  Wt Readings from Last 3 Encounters:  05/12/19 200 lb (90.7 kg)  11/03/18 200 lb 12.8 oz (91.1 kg)  04/07/18 206 lb 3.2 oz (93.5 kg)    Diabetic Foot Exam - Simple   No data filed      Lab Results  Component Value Date   WBC 7.2 10/27/2018   HGB 12.6 10/27/2018   HCT 39.3 10/27/2018   PLT 240.0 10/27/2018   GLUCOSE 129 (H) 10/27/2018   CHOL 162 10/27/2018   TRIG 148.0 10/27/2018   HDL 56.80 10/27/2018   LDLCALC 76 10/27/2018   ALT 9 10/27/2018   AST 13 10/27/2018   NA 135 10/27/2018   K 4.1 10/27/2018   CL 98 10/27/2018   CREATININE 0.61 10/27/2018   BUN 14 10/27/2018   CO2 28 10/27/2018   TSH 1.04 10/27/2018   INR 1.04 01/18/2013   HGBA1C 7.5 (H) 10/27/2018   MICROALBUR <0.7 07/15/2015    Lab Results  Component Value Date   TSH 1.04 10/27/2018   Lab Results  Component Value Date   WBC 7.2 10/27/2018   HGB 12.6 10/27/2018   HCT 39.3 10/27/2018   MCV 84.2 10/27/2018   PLT 240.0 10/27/2018   Lab Results  Component Value Date   NA 135 10/27/2018   K 4.1 10/27/2018   CO2 28 10/27/2018   GLUCOSE 129 (H) 10/27/2018   BUN 14 10/27/2018   CREATININE 0.61 10/27/2018   BILITOT 0.3 10/27/2018   ALKPHOS 53 10/27/2018   AST 13 10/27/2018   ALT 9 10/27/2018   PROT 6.2 10/27/2018   ALBUMIN 4.1 10/27/2018   CALCIUM 9.1 10/27/2018   ANIONGAP 9 08/09/2015   GFR 96.92 10/27/2018   Lab Results  Component Value Date   CHOL 162 10/27/2018   Lab Results  Component Value Date   HDL 56.80 10/27/2018   Lab Results  Component Value Date   LDLCALC 76 10/27/2018   Lab Results  Component Value Date   TRIG 148.0 10/27/2018   Lab Results  Component Value Date   CHOLHDL 3 10/27/2018   Lab Results  Component Value Date   HGBA1C 7.5 (H) 10/27/2018       Assessment & Plan:   Problem  List Items Addressed This Visit    COPD (chronic obstructive pulmonary disease) ([place])    Doing well no recent exacerbation.       Hypertension    Well controlled, no changes to meds. Encouraged heart healthy diet such as the DASH diet and exercise as tolerated.       Hyperlipidemia    Encouraged heart healthy diet, increase exercise, avoid trans fats,  consider a krill oil cap daily. Pravastatin is tolerated      Diabetes mellitus type 2 in obese (HCC)    hgba1c acceptable, minimize simple carbs. Increase exercise as tolerated. Continue current meds.          I am having Brier Q. Ronda "Holly" maintain her ferrous sulfate, Calcium Citrate-Vitamin D (CITRACAL + D PO), Multiple Vitamins-Minerals (ZINC PO), CoQ10, EPINEPHrine, guaiFENesin, Probiotic Product (PROBIOTIC PO), MELATONIN PO, albuterol, tobramycin, Symbicort, losartan, omeprazole, pravastatin, metoprolol succinate, metFORMIN, and citalopram.  No orders of the defined types were placed in this encounter.    I discussed the assessment and treatment plan with the patient. The patient was provided an opportunity to ask questions and all were answered. The patient agreed with the plan and demonstrated an understanding of the instructions.   The patient was advised to call back or seek an in-person evaluation if the symptoms worsen or if the condition fails to improve as anticipated.  I provided 25 minutes of non-face-to-face time during this encounter.   Penni Homans, MD

## 2019-05-15 ENCOUNTER — Other Ambulatory Visit: Payer: Self-pay

## 2019-05-15 ENCOUNTER — Ambulatory Visit (INDEPENDENT_AMBULATORY_CARE_PROVIDER_SITE_OTHER): Payer: Medicare Other | Admitting: Family Medicine

## 2019-05-15 ENCOUNTER — Encounter: Payer: Self-pay | Admitting: Family Medicine

## 2019-05-15 ENCOUNTER — Telehealth: Payer: Self-pay | Admitting: Family Medicine

## 2019-05-15 VITALS — BP 132/82 | HR 99 | Temp 95.4°F | Resp 16 | Ht 63.0 in | Wt 192.0 lb

## 2019-05-15 DIAGNOSIS — E669 Obesity, unspecified: Secondary | ICD-10-CM

## 2019-05-15 DIAGNOSIS — I10 Essential (primary) hypertension: Secondary | ICD-10-CM | POA: Diagnosis not present

## 2019-05-15 DIAGNOSIS — F32A Depression, unspecified: Secondary | ICD-10-CM

## 2019-05-15 DIAGNOSIS — E119 Type 2 diabetes mellitus without complications: Secondary | ICD-10-CM

## 2019-05-15 DIAGNOSIS — F329 Major depressive disorder, single episode, unspecified: Secondary | ICD-10-CM | POA: Diagnosis not present

## 2019-05-15 DIAGNOSIS — S81802A Unspecified open wound, left lower leg, initial encounter: Secondary | ICD-10-CM

## 2019-05-15 DIAGNOSIS — E1169 Type 2 diabetes mellitus with other specified complication: Secondary | ICD-10-CM

## 2019-05-15 LAB — BASIC METABOLIC PANEL
BUN: 16 mg/dL (ref 6–23)
CO2: 29 mEq/L (ref 19–32)
Calcium: 9.4 mg/dL (ref 8.4–10.5)
Chloride: 99 mEq/L (ref 96–112)
Creatinine, Ser: 0.62 mg/dL (ref 0.40–1.20)
GFR: 94.97 mL/min (ref >60.00)
Glucose, Bld: 97 mg/dL (ref 70–99)
Potassium: 4.5 mEq/L (ref 3.5–5.1)
Sodium: 139 mEq/L (ref 135–145)

## 2019-05-15 LAB — HEMOGLOBIN A1C: Hgb A1c MFr Bld: 7.3 % — ABNORMAL HIGH (ref 4.6–6.5)

## 2019-05-15 MED ORDER — CEPHALEXIN 500 MG PO CAPS: 500.0000 mg | ORAL_CAPSULE | Freq: Three times a day (TID) | ORAL | 0 refills | Status: DC

## 2019-05-15 MED ORDER — CITALOPRAM HYDROBROMIDE 40 MG PO TABS: 40.0000 mg | ORAL_TABLET | Freq: Every day | ORAL | 1 refills | Status: DC

## 2019-05-15 NOTE — Patient Instructions (Addendum)
It was good to see you today- I will be in touch with your labs asap Please do get a tetanus booster from your pharmacist in one week  We will use keflex antibiotic for the wound on your right leg- please let me know if not improving in the next several days, Sooner if worse.

## 2019-05-15 NOTE — Telephone Encounter (Signed)
Bmp and a1c was done

## 2019-05-15 NOTE — Progress Notes (Addendum)
Savannah Stanley at [facility] [address] [place], [place] [address] [phone]  Date:  05/15/2019   Name:  Savannah Stanley   DOB:  1950-04-22   MRN:  [id number]  PCP:  [name], MD    Chief Complaint: Wound Check   History of Present Illness:  RASCHEL Stanley is a 69 y.o. very pleasant female patient who presents with the following:  Primary patient of my partner Dr. Charlett Blake, history of diabetes, hypertension, COPD, bladder cancer, hyperlipidemia Lab Results  Component Value Date   HGBA1C 7.5 (H) 10/27/2018   I have not seen this patient in the past-she did a virtual exam with her PCP on February 26 Here today with concern of a wound on her leg Last tetanus 2012 Due for foot exam Not quite 10 days ago now she cut her right shin on a dog bone.  Her son's dog was sitting on her lap, and then jumped down.  He was playing with a sharp edged Nylabone which hit her shin and cut it.  This is not a bite wound She has felt well- no fever She got her covid shot #2 on 2/23 and had some chills at that time   Patient Active Problem List   Diagnosis Date Noted  . Nonspecific abnormal electrocardiogram (ECG) (EKG) 08/07/2015  . Diabetes mellitus type 2 in obese ([place]) 01/31/2015  . Murmur, heart 05/20/2014  . Preventative health care 11/17/2013  . Malignant neoplasm of bladder ([place]) 02/16/2013  . Urethral carcinoma ([place]) 01/19/2013  . Hypokalemia 01/18/2013  . Weakness 01/18/2013  . Tachycardia 01/18/2013  . Conjunctivitis 11/18/2012  . Esophageal reflux 08/25/2012  . Hyperlipidemia 02/17/2012  . Hypertension 09/22/2010  . COPD (chronic obstructive pulmonary disease) ([place]) 08/17/2010  . Anemia 08/17/2010  . Depression 08/17/2010  . Insomnia 08/17/2010    Past Medical History:  Diagnosis Date  . Acute bronchitis with COPD ([place]) 03/19/2015  . Bladder cancer (HCC) UROLOGIST-- DR Diona Fanti   HIGH-GRADE UROTHELIAL CARCINOMA  .  Bladder tumor "multiple"   "some ORs; some removed in office"  . COPD (chronic obstructive pulmonary disease) ([place])   . Depression   . Diabetes mellitus type 2 in obese ([place]) 01/31/2015  . GERD (gastroesophageal reflux disease)   . H/O hiatal hernia   . High cholesterol   . History of blood transfusion 01/2013 - 02/2013   related to ORs  . Hypertension   . Iron deficiency anemia   . Murmur, heart 05/20/2014  . Pneumonia 07/2010; 08/07/2015  . Type 2 diabetes mellitus ([place])     Past Surgical History:  Procedure Laterality Date  . APPENDECTOMY  1980's  . CYSTOSCOPY N/A 12/18/2013   Procedure: CYSTOSCOPY;  Surgeon: Jorja Loa, MD;  Location: [facility];  Service: Urology;  Laterality: N/A;  . TRANSURETHRAL RESECTION OF BLADDER TUMOR N/A 02/16/2013   Procedure: TRANSURETHRAL RESECTION OF BLADDER TUMOR (TURBT);  Surgeon: Franchot Gallo, MD;  [facility];  Service: Urology;  Laterality: N/A;  . TRANSURETHRAL RESECTION OF BLADDER TUMOR N/A 12/18/2013   Procedure: TRANSURETHRAL RESECTION OF BLADDER TUMOR (TURBT) ;  Surgeon: Jorja Loa, MD;  Location: [facility];  Service: Urology;  Laterality: N/A;  . TRANSURETHRAL RESECTION OF BLADDER TUMOR WITH GYRUS (TURBT-GYRUS) N/A 01/23/2013   Procedure: TRANSURETHRAL RESECTION OF BLADDER TUMOR WITH GYRUS ;  Surgeon: Franchot Gallo, MD;  Location: [facility];  Service: Urology;  Laterality:  N/A;  . TUBAL LIGATION  1980's    Social History   Tobacco Use  . Smoking status: Former Smoker    Packs/day: 1.00    Years: 35.00    Pack years: 35.00    Types: Cigarettes    Quit date: 07/15/2010    Years since quitting: 8.8  . Smokeless tobacco: Never Used  Substance Use Topics  . Alcohol use: Yes    Comment: wine socially   . Drug use: No    Family History  Problem Relation Age of Onset  . Cancer Father        prostate  . Arthritis Brother   . Frontotemporal dementia Brother    . Dementia Brother     Allergies  Allergen Reactions  . Bee Venom Shortness Of Breath  . Other Shortness Of Breath    MSG.  . Latex Itching    Medication list has been reviewed and updated.  Current Outpatient Medications on File Prior to Visit  Medication Sig Dispense Refill  . albuterol (PROAIR HFA) 108 (90 Base) MCG/ACT inhaler Inhale 2 puffs into the lungs every 6 (six) hours as needed for wheezing or shortness of breath.    . Calcium Citrate-Vitamin D (CITRACAL + D PO) Take 2 tablets by mouth 2 (two) times daily.     . citalopram (CELEXA) 40 MG tablet Take 1 tablet by mouth once daily 90 tablet 0  . Coenzyme Q10 (COQ10) 200 MG CAPS Take 1 capsule by mouth daily.    Marland Kitchen EPINEPHrine 0.3 mg/0.3 mL IJ SOAJ injection Inject 0.3 mLs (0.3 mg total) into the muscle once. 1 Device 1  . ferrous sulfate (FEOSOL) 325 (65 FE) MG tablet Take 325 mg by mouth daily with breakfast.     . guaiFENesin (MUCINEX) 600 MG 12 hr tablet Take 1 tablet (600 mg total) by mouth 2 (two) times daily. 20 tablet 0  . losartan (COZAAR) 50 MG tablet TAKE 1 TABLET BY MOUTH  DAILY 90 tablet 3  . MELATONIN PO Take 1 tablet by mouth at bedtime.    . metFORMIN (GLUCOPHAGE) 500 MG tablet TAKE 2 TABLETS BY MOUTH IN  THE MORNING AND 1 TABLET IN THE EVENING 270 tablet 3  . metoprolol succinate (TOPROL-XL) 25 MG 24 hr tablet TAKE 1 TABLET BY MOUTH  DAILY 90 tablet 3  . Multiple Vitamins-Minerals (ZINC PO) Take 1 tablet by mouth daily.    Marland Kitchen omeprazole (PRILOSEC) 20 MG capsule TAKE 1 CAPSULE BY MOUTH TWO TIMES DAILY AS NEEDED 180 capsule 3  . pravastatin (PRAVACHOL) 10 MG tablet TAKE 1 TABLET BY MOUTH  DAILY 90 tablet 3  . Probiotic Product (PROBIOTIC PO) Take 1 capsule by mouth daily.    . SYMBICORT 160-4.5 MCG/ACT inhaler USE 2 INHALATIONS BY MOUTH  TWICE DAILY 30.6 g 3  . tobramycin (TOBREX) 0.3 % ophthalmic solution Place 2 drops into the right eye every 6 (six) hours. 5 mL 0   Current Facility-Administered Medications  on File Prior to Visit  Medication Dose Route Frequency Provider Last Rate Last Admin  . mitoMYcin (MUTAMYCIN) chemo injection 40 mg  40 mg Bladder Instillation Once Franchot Gallo, MD        Review of Systems:  As per HPI- otherwise negative.   Physical Examination: Vitals:   05/15/19 0927  BP: 132/82  Pulse: 99  Resp: 16  Temp: (!) 95.4 F (35.2 C)  SpO2: 97%   Vitals:   05/15/19 0927  Weight: 192 lb (87.1 kg)  Height: 5\' 3"  (1.6 m)   Body mass index is 34.01 kg/m. Ideal Body Weight: Weight in (lb) to have BMI = 25: 140.8  GEN: no acute distress.  Obese, looks well HEENT: Atraumatic, Normocephalic.  Ears and Nose: No external deformity. CV: RRR, No M/G/R. No JVD. No thrill. No extra heart sounds. PULM: CTA B, no wheezes, crackles, rhonchi. No retractions. No resp. distress. No accessory muscle use. EXTR: No c/c/e PSYCH: Normally interactive. Conversant.  Foot exam normal today RIGHT leg displays a healing wound over the inferior medial shin.  It is an inverted V shape, per patient report it started with a skin tear.  It appears to be healing reasonably well, there is some mild tenderness and erythema of the skin surrounding the wound.  No pus or heat is noted     Assessment and Plan: Diabetes mellitus type 2 in obese ([place]) - Plan: Basic metabolic panel, Hemoglobin A1c  Essential hypertension  Wound of left lower extremity, initial encounter - Plan: cephALEXin (KEFLEX) 500 MG capsule  Depression, unspecified depression type - Plan: citalopram (CELEXA) 40 MG tablet  Here today with a wound of her left shin which occurred 9 days ago Wound appears to be healing reasonably well, but does have some tenderness and mild erythema.  We will start her on an antibiotic for 1 week.  Would like to update her tetanus, but she had her second Covid shot 1 week ago.  Discussed with patient.  At current time we recommend waiting 2 weeks in between Covid vaccine and any other  vaccine.  She prefers to defer a tetanus vaccine, will plan to have this done with her pharmacy in 1 week  Check labs for diabetes control  She is taking Celexa, doing well on current dose.  Needs refill  Moderate medical decision making today This visit occurred during the SARS-CoV-2 public health emergency.  Safety protocols were in place, including screening questions prior to the visit, additional usage of staff PPE, and extensive cleaning of exam room while observing appropriate contact time as indicated for disinfecting solutions.     Signed Lamar Blinks, MD  Received her labs, message to patient A1c is stable Results for orders placed or performed in visit on 0000000  Basic metabolic panel  Result Value Ref Range   Sodium 139 135 - 145 mEq/L   Potassium 4.5 3.5 - 5.1 mEq/L   Chloride 99 96 - 112 mEq/L   CO2 29 19 - 32 mEq/L   Glucose, Bld 97 70 - 99 mg/dL   BUN 16 6 - 23 mg/dL   Creatinine, Ser 0.62 0.40 - 1.20 mg/dL   GFR 94.97 >60.00 mL/min   Calcium 9.4 8.4 - 10.5 mg/dL  Hemoglobin A1c  Result Value Ref Range   Hgb A1c MFr Bld 7.3 (H) 4.6 - 6.5 %

## 2019-05-15 NOTE — Telephone Encounter (Signed)
PT is coming in at 940 Acute with [facility]. Stated she was suppose to have labs done per her appt with Dr.B last week However  there is no order in the system. Please advise or add orders so patient can have labs done today. Thanks

## 2019-05-23 ENCOUNTER — Encounter: Payer: Self-pay | Admitting: Family Medicine

## 2019-06-02 ENCOUNTER — Telehealth: Payer: Self-pay | Admitting: Family Medicine

## 2019-06-02 NOTE — Telephone Encounter (Signed)
Caller : ZLATA NGO   Call # [phone]  Patient states that she is changing Pharmacies to [facility] and would like all (prescriptions) medications she there.  Do not send : citalopram (CELEXA) 40 MG tablet [id number]  She gets that medication sent to [facility] only.

## 2019-06-05 NOTE — Telephone Encounter (Signed)
Pharmacy updated and patient will let us know when she will need refills.

## 2019-06-15 ENCOUNTER — Other Ambulatory Visit: Payer: Self-pay

## 2019-06-15 DIAGNOSIS — F329 Major depressive disorder, single episode, unspecified: Secondary | ICD-10-CM

## 2019-06-15 DIAGNOSIS — F32A Depression, unspecified: Secondary | ICD-10-CM

## 2019-06-15 NOTE — Telephone Encounter (Signed)
Patient called in to see if Dr. Charlett Blake could send in a prescription for metFORMIN (GLUCOPHAGE) 500 MG tablet [id number]   metoprolol succinate (TOPROL-XL) 25 MG 24 hr tablet [id number]   losartan (COZAAR) 50 MG tablet [id number]   pravastatin (PRAVACHOL) 10 MG tablet [id number]   citalopram (CELEXA) 40 MG tablet [id number]   Please send this to: [employer] number  [phone]

## 2019-06-19 ENCOUNTER — Telehealth: Payer: Self-pay

## 2019-06-19 MED ORDER — PRAVASTATIN SODIUM 10 MG PO TABS: 10.0000 mg | ORAL_TABLET | Freq: Every day | ORAL | 3 refills | Status: DC

## 2019-06-19 MED ORDER — METFORMIN HCL 500 MG PO TABS: ORAL_TABLET | ORAL | 3 refills | Status: DC

## 2019-06-19 MED ORDER — LOSARTAN POTASSIUM 50 MG PO TABS: 50.0000 mg | ORAL_TABLET | Freq: Every day | ORAL | 3 refills | Status: DC

## 2019-06-19 MED ORDER — METOPROLOL SUCCINATE ER 25 MG PO TB24: 25.0000 mg | ORAL_TABLET | Freq: Every day | ORAL | 3 refills | Status: DC

## 2019-06-19 MED ORDER — CITALOPRAM HYDROBROMIDE 40 MG PO TABS: 40.0000 mg | ORAL_TABLET | Freq: Every day | ORAL | 1 refills | Status: DC

## 2019-06-19 NOTE — Telephone Encounter (Signed)
Patient notified that rxs has been sent in.

## 2019-06-19 NOTE — Telephone Encounter (Signed)
Patient called in to see if Dr. Charlett Blake could send in a prescription for metFORMIN (GLUCOPHAGE) 500 MG tablet [id number]   metoprolol succinate (TOPROL-XL) 25 MG 24 hr tablet [id number]   losartan (COZAAR) 50 MG tablet [id number]   pravastatin (PRAVACHOL) 10 MG tablet [id number]   citalopram (CELEXA) 40 MG tablet [id number]   Please send this to: [employer] number  [phone]

## 2019-09-04 DIAGNOSIS — Z8551 Personal history of malignant neoplasm of bladder: Secondary | ICD-10-CM | POA: Diagnosis not present

## 2019-09-08 ENCOUNTER — Encounter: Payer: Self-pay | Admitting: Family Medicine

## 2019-09-08 MED ORDER — ALBUTEROL SULFATE HFA 108 (90 BASE) MCG/ACT IN AERS: 2.0000 | INHALATION_SPRAY | Freq: Four times a day (QID) | RESPIRATORY_TRACT | 1 refills | Status: DC | PRN

## 2019-09-18 ENCOUNTER — Encounter: Payer: Self-pay | Admitting: Family Medicine

## 2019-09-20 ENCOUNTER — Telehealth (INDEPENDENT_AMBULATORY_CARE_PROVIDER_SITE_OTHER): Payer: Medicare Other | Admitting: Family Medicine

## 2019-09-20 ENCOUNTER — Encounter: Payer: Self-pay | Admitting: Family Medicine

## 2019-09-20 VITALS — Ht 63.0 in | Wt 195.0 lb

## 2019-09-20 DIAGNOSIS — J019 Acute sinusitis, unspecified: Secondary | ICD-10-CM

## 2019-09-20 MED ORDER — AZITHROMYCIN 250 MG PO TABS: ORAL_TABLET | ORAL | 0 refills | Status: DC

## 2019-09-20 MED ORDER — ASPIRIN EC 81 MG PO TBEC: 81.0000 mg | DELAYED_RELEASE_TABLET | Freq: Every day | ORAL | 11 refills | Status: DC

## 2019-09-20 NOTE — Progress Notes (Signed)
Subjective:    Patient ID: Savannah Stanley, female    DOB: [birth year], 69 y.o.   MRN: [id number]  HPI Virtual Visit via Video Note  I connected with the patient on 09/20/19 at  1:15 PM EDT by a video enabled telemedicine application and verified that I am speaking with the correct person using two identifiers.  Location patient: home Location provider:work or home office Persons participating in the virtual visit: patient, provider  I discussed the limitations of evaluation and management by telemedicine and the availability of in person appointments. The patient expressed understanding and agreed to proceed.   HPI: Here for one week of stuffy head, PND, blowing yellow mucus from the nose, and a dry cough. No fever. No chest pain or SOB. Using Flonase, Allegra, and Mucinex.    ROS: See pertinent positives and negatives per HPI.  Past Medical History:  Diagnosis Date  . Acute bronchitis with COPD ([place]) 03/19/2015  . Bladder cancer (HCC) UROLOGIST-- DR Diona Fanti   HIGH-GRADE UROTHELIAL CARCINOMA  . Bladder tumor "multiple"   "some ORs; some removed in office"  . COPD (chronic obstructive pulmonary disease) ([place])   . Depression   . Diabetes mellitus type 2 in obese ([place]) 01/31/2015  . GERD (gastroesophageal reflux disease)   . H/O hiatal hernia   . High cholesterol   . History of blood transfusion 01/2013 - 02/2013   related to ORs  . Hypertension   . Iron deficiency anemia   . Murmur, heart 05/20/2014  . Pneumonia 07/2010; 08/07/2015  . Type 2 diabetes mellitus ([place])     Past Surgical History:  Procedure Laterality Date  . APPENDECTOMY  1980's  . CYSTOSCOPY N/A 12/18/2013   Procedure: CYSTOSCOPY;  Surgeon: Jorja Loa, MD;  Location: [facility];  Service: Urology;  Laterality: N/A;  . TRANSURETHRAL RESECTION OF BLADDER TUMOR N/A 02/16/2013   Procedure: TRANSURETHRAL RESECTION OF BLADDER TUMOR (TURBT);  Surgeon: Franchot Gallo, MD;  Location: [facility];  Service: Urology;  Laterality: N/A;  . TRANSURETHRAL RESECTION OF BLADDER TUMOR N/A 12/18/2013   Procedure: TRANSURETHRAL RESECTION OF BLADDER TUMOR (TURBT) ;  Surgeon: Jorja Loa, MD;  Location: [facility];  Service: Urology;  Laterality: N/A;  . TRANSURETHRAL RESECTION OF BLADDER TUMOR WITH GYRUS (TURBT-GYRUS) N/A 01/23/2013   Procedure: TRANSURETHRAL RESECTION OF BLADDER TUMOR WITH GYRUS ;  Surgeon: Franchot Gallo, MD;  Location: [facility];  Service: Urology;  Laterality: N/A;  . TUBAL LIGATION  1980's    Family History  Problem Relation Age of Onset  . Cancer Father        prostate  . Arthritis Brother   . Frontotemporal dementia Brother   . Dementia Brother      Current Outpatient Medications:  .  albuterol (PROAIR HFA) 108 (90 Base) MCG/ACT inhaler, Inhale 2 puffs into the lungs every 6 (six) hours as needed for wheezing or shortness of breath., Disp: 54 g, Rfl: 1 .  Calcium Citrate-Vitamin D (CITRACAL + D PO), Take 2 tablets by mouth 2 (two) times daily. , Disp: , Rfl:  .  citalopram (CELEXA) 40 MG tablet, Take 1 tablet (40 mg total) by mouth daily., Disp: 90 tablet, Rfl: 1 .  Coenzyme Q10 (COQ10) 200 MG CAPS, Take 1 capsule by mouth daily., Disp: , Rfl:  .  EPINEPHrine 0.3 mg/0.3 mL IJ SOAJ injection, Inject 0.3 mLs (0.3 mg total) into the muscle once., Disp: 1 Device, Rfl: 1 .  ferrous  sulfate (FEOSOL) 325 (65 FE) MG tablet, Take 325 mg by mouth daily with breakfast. , Disp: , Rfl:  .  guaiFENesin (MUCINEX) 600 MG 12 hr tablet, Take 1 tablet (600 mg total) by mouth 2 (two) times daily., Disp: 20 tablet, Rfl: 0 .  losartan (COZAAR) 50 MG tablet, Take 1 tablet (50 mg total) by mouth daily., Disp: 90 tablet, Rfl: 3 .  MELATONIN PO, Take 1 tablet by mouth at bedtime., Disp: , Rfl:  .  metFORMIN (GLUCOPHAGE) 500 MG tablet, TAKE 2 TABLETS BY MOUTH IN  THE MORNING AND 1 TABLET IN THE EVENING, Disp: 270 tablet, Rfl: 3 .  metoprolol succinate  (TOPROL-XL) 25 MG 24 hr tablet, Take 1 tablet (25 mg total) by mouth daily., Disp: 90 tablet, Rfl: 3 .  Multiple Vitamins-Minerals (ZINC PO), Take 1 tablet by mouth daily., Disp: , Rfl:  .  omeprazole (PRILOSEC) 20 MG capsule, TAKE 1 CAPSULE BY MOUTH TWO TIMES DAILY AS NEEDED, Disp: 180 capsule, Rfl: 3 .  pravastatin (PRAVACHOL) 10 MG tablet, Take 1 tablet (10 mg total) by mouth daily., Disp: 90 tablet, Rfl: 3 .  Probiotic Product (PROBIOTIC PO), Take 1 capsule by mouth daily., Disp: , Rfl:  .  SYMBICORT 160-4.5 MCG/ACT inhaler, USE 2 INHALATIONS BY MOUTH  TWICE DAILY, Disp: 30.6 g, Rfl: 3 .  tobramycin (TOBREX) 0.3 % ophthalmic solution, Place 2 drops into the right eye every 6 (six) hours., Disp: 5 mL, Rfl: 0 .  aspirin EC 81 MG tablet, Take 1 tablet (81 mg total) by mouth daily. Swallow whole., Disp: 30 tablet, Rfl: 11 .  azithromycin (ZITHROMAX) 250 MG tablet, As directed, Disp: 6 tablet, Rfl: 0 No current facility-administered medications for this visit.  Facility-Administered Medications Ordered in Other Visits:  .  mitoMYcin (MUTAMYCIN) chemo injection 40 mg, 40 mg, Bladder Instillation, Once, Franchot Gallo, MD  EXAM:  VITALS per patient if applicable:  GENERAL: alert, oriented, appears well and in no acute distress  HEENT: atraumatic, conjunttiva clear, no obvious abnormalities on inspection of external nose and ears  NECK: normal movements of the head and neck  LUNGS: on inspection no signs of respiratory distress, breathing rate appears normal, no obvious gross SOB, gasping or wheezing  CV: no obvious cyanosis  MS: moves all visible extremities without noticeable abnormality  PSYCH/NEURO: pleasant and cooperative, no obvious depression or anxiety, speech and thought processing grossly intact  ASSESSMENT AND PLAN: Sinusitis, treat with a Zpack.  Alysia Penna, MD  Discussed the following assessment and plan:  No diagnosis found.     I discussed the assessment and  treatment plan with the patient. The patient was provided an opportunity to ask questions and all were answered. The patient agreed with the plan and demonstrated an understanding of the instructions.   The patient was advised to call back or seek an in-person evaluation if the symptoms worsen or if the condition fails to improve as anticipated.     Review of Systems     Objective:   Physical Exam        Assessment & Plan:

## 2019-09-27 ENCOUNTER — Telehealth: Payer: Self-pay | Admitting: Family Medicine

## 2019-09-27 NOTE — Telephone Encounter (Signed)
Please advise. Should the patient have an appointment for this?

## 2019-09-27 NOTE — Telephone Encounter (Signed)
Patient had a virtual visit with Dr Sarajane Jews last week.  She was directed to take a Z-pack which she has finished.  She is now having nose bleeds and wants to speak to Dr Barbie Banner nurse.

## 2019-09-28 NOTE — Telephone Encounter (Signed)
Spoke with the patient, she is aware of Dr. Barbie Banner message below.

## 2019-09-28 NOTE — Telephone Encounter (Signed)
I think she should have an OV with Dr. Charlett Blake for this

## 2019-10-18 NOTE — Progress Notes (Signed)
I connected with Earnest Bailey today by telephone and verified that I am speaking with the correct person using two identifiers. Location patient: home Location provider: work Persons participating in the virtual visit: patient, Marine scientist.    I discussed the limitations, risks, security and privacy concerns of performing an evaluation and management service by telephone and the availability of in person appointments. I also discussed with the patient that there may be a patient responsible charge related to this service. The patient expressed understanding and verbally consented to this telephonic visit.    Interactive audio and video telecommunications were attempted between this provider and patient, however failed, due to patient having technical difficulties OR patient did not have access to video capability.  We continued and completed visit with audio only.  Some vital signs may be absent or patient reported.   Subjective:   Savannah Stanley is a 69 y.o. female who presents for Medicare Annual (Subsequent) preventive examination.  Review of Systems    Cardiac Risk Factors include: advanced age (>26men, >46 women);dyslipidemia;diabetes mellitus     Objective:    Today's Vitals   10/19/19 0849  BP: 118/70  SpO2: 97%   There is no height or weight on file to calculate BMI.  Advanced Directives 10/19/2019 10/18/2018 08/24/2017 08/07/2015 11/17/2013 02/16/2013 01/23/2013  Does Patient Have a Medical Advance Directive? No No Yes No No;Yes Patient has advance directive, copy not in chart -  Type of Advance Directive - - [place];Living will - [place];Living will - -  Does patient want to make changes to medical advance directive? - - - - - - -  Copy of [place] in Chart? - - No - copy requested - No - copy requested - -  Would patient like information on creating a medical advance directive? No - Patient declined No - Patient declined - Yes -  Educational materials given No - patient declined information - -  Pre-existing out of facility DNR order (yellow form or pink MOST form) - - - - - - No    Current Medications (verified) Outpatient Encounter Medications as of 10/19/2019  Medication Sig  . albuterol (PROAIR HFA) 108 (90 Base) MCG/ACT inhaler Inhale 2 puffs into the lungs every 6 (six) hours as needed for wheezing or shortness of breath.  . Calcium Citrate-Vitamin D (CITRACAL + D PO) Take 2 tablets by mouth 2 (two) times daily.   . citalopram (CELEXA) 40 MG tablet Take 1 tablet (40 mg total) by mouth daily.  . Coenzyme Q10 (COQ10) 200 MG CAPS Take 1 capsule by mouth daily.  Marland Kitchen losartan (COZAAR) 50 MG tablet Take 1 tablet (50 mg total) by mouth daily.  Marland Kitchen MELATONIN PO Take 1 tablet by mouth at bedtime.  . metFORMIN (GLUCOPHAGE) 500 MG tablet TAKE 2 TABLETS BY MOUTH IN  THE MORNING AND 1 TABLET IN THE EVENING  . metoprolol succinate (TOPROL-XL) 25 MG 24 hr tablet Take 1 tablet (25 mg total) by mouth daily.  . Multiple Vitamins-Minerals (ZINC PO) Take 1 tablet by mouth daily.  Marland Kitchen omeprazole (PRILOSEC) 20 MG capsule TAKE 1 CAPSULE BY MOUTH TWO TIMES DAILY AS NEEDED  . pravastatin (PRAVACHOL) 10 MG tablet Take 1 tablet (10 mg total) by mouth daily.  . Probiotic Product (PROBIOTIC PO) Take 1 capsule by mouth daily.  Marland Kitchen tobramycin (TOBREX) 0.3 % ophthalmic solution Place 2 drops into the right eye every 6 (six) hours.  Marland Kitchen aspirin EC 81 MG  tablet Take 1 tablet (81 mg total) by mouth daily. Swallow whole. (Patient not taking: Reported on 10/19/2019)  . azithromycin (ZITHROMAX) 250 MG tablet As directed  . EPINEPHrine 0.3 mg/0.3 mL IJ SOAJ injection Inject 0.3 mLs (0.3 mg total) into the muscle once. (Patient not taking: Reported on 10/19/2019)  . guaiFENesin (MUCINEX) 600 MG 12 hr tablet Take 1 tablet (600 mg total) by mouth 2 (two) times daily. (Patient not taking: Reported on 10/19/2019)  . SYMBICORT 160-4.5 MCG/ACT inhaler USE 2 INHALATIONS BY  MOUTH  TWICE DAILY (Patient not taking: Reported on 10/19/2019)  . [DISCONTINUED] ferrous sulfate (FEOSOL) 325 (65 FE) MG tablet Take 325 mg by mouth daily with breakfast.    Facility-Administered Encounter Medications as of 10/19/2019  Medication  . mitoMYcin (MUTAMYCIN) chemo injection 40 mg    Allergies (verified) Bee venom, Other, and Latex   History: Past Medical History:  Diagnosis Date  . Acute bronchitis with COPD ([place]) 03/19/2015  . Bladder cancer (HCC) UROLOGIST-- DR Diona Fanti   HIGH-GRADE UROTHELIAL CARCINOMA  . Bladder tumor "multiple"   "some ORs; some removed in office"  . COPD (chronic obstructive pulmonary disease) ([place])   . Depression   . Diabetes mellitus type 2 in obese ([place]) 01/31/2015  . GERD (gastroesophageal reflux disease)   . H/O hiatal hernia   . High cholesterol   . History of blood transfusion 01/2013 - 02/2013   related to ORs  . Hypertension   . Iron deficiency anemia   . Murmur, heart 05/20/2014  . Pneumonia 07/2010; 08/07/2015  . Type 2 diabetes mellitus ([place])    Past Surgical History:  Procedure Laterality Date  . APPENDECTOMY  1980's  . CYSTOSCOPY N/A 12/18/2013   Procedure: CYSTOSCOPY;  Surgeon: Jorja Loa, MD;  [facility];  Service: Urology;  Laterality: N/A;  . TRANSURETHRAL RESECTION OF BLADDER TUMOR N/A 02/16/2013   Procedure: TRANSURETHRAL RESECTION OF BLADDER TUMOR (TURBT);  Surgeon: Franchot Gallo, MD;  Location: [facility];  Service: Urology;  Laterality: N/A;  . TRANSURETHRAL RESECTION OF BLADDER TUMOR N/A 12/18/2013   Procedure: TRANSURETHRAL RESECTION OF BLADDER TUMOR (TURBT) ;  Surgeon: Jorja Loa, MD;  Location[facility];  Service: Urology;  Laterality: N/A;  . TRANSURETHRAL RESECTION OF BLADDER TUMOR WITH GYRUS (TURBT-GYRUS) N/A 01/23/2013   Procedure: TRANSURETHRAL RESECTION OF BLADDER TUMOR WITH GYRUS ;  Surgeon: Franchot Gallo, MD;  [facility];   Service: Urology;  Laterality: N/A;  . TUBAL LIGATION  1980's   Family History  Problem Relation Age of Onset  . Cancer Father        prostate  . Arthritis Brother   . Frontotemporal dementia Brother   . Dementia Brother    Social History   Socioeconomic History  . Marital status: Widowed    Spouse name: Not on file  . Number of children: Not on file  . Years of education: Not on file  . Highest education level: Not on file  Occupational History  . Not on file  Tobacco Use  . Smoking status: Former Smoker    Packs/day: 1.00    Years: 35.00    Pack years: 35.00    Types: Cigarettes    Quit date: 07/15/2010    Years since quitting: 9.2  . Smokeless tobacco: Never Used  Substance and Sexual Activity  . Alcohol use: Yes    Comment: wine socially   . Drug use: No  . Sexual activity: Not on  file  Other Topics Concern  . Not on file  Social History Narrative  . Not on file   Social Determinants of Health   Financial Resource Strain: Low Risk   . Difficulty of Paying Living Expenses: Not hard at all  Food Insecurity: No Food Insecurity  . Worried About Charity fundraiser in the Last Year: Never true  . Ran Out of Food in the Last Year: Never true  Transportation Needs: No Transportation Needs  . Lack of Transportation (Medical): No  . Lack of Transportation (Non-Medical): No  Physical Activity:   . Days of Exercise per Week:   . Minutes of Exercise per Session:   Stress:   . Feeling of Stress :   Social Connections:   . Frequency of Communication with Friends and Family:   . Frequency of Social Gatherings with Friends and Family:   . Attends Religious Services:   . Active Member of Clubs or Organizations:   . Attends Archivist Meetings:   Marland Kitchen Marital Status:     Tobacco Counseling Counseling given: Not Answered   Clinical Intake: Pain : No/denies pain    Activities of Daily Living In your present state of health, do you have any difficulty  performing the following activities: 10/19/2019  Hearing? N  Vision? N  Difficulty concentrating or making decisions? N  Walking or climbing stairs? N  Dressing or bathing? N  Doing errands, shopping? N  Preparing Food and eating ? N  Using the Toilet? N  In the past six months, have you accidently leaked urine? N  Do you have problems with loss of bowel control? N  Managing your Medications? N  Managing your Finances? N  Housekeeping or managing your Housekeeping? N  Some recent data might be hidden    Patient Care Team: [name], MD as PCP - General (Family Medicine) Franchot Gallo, MD as Consulting Physician (Urology)  Indicate any recent Medical Services you may have received from other than [facility] providers in the past year (date may be approximate).     Assessment:   This is a routine wellness examination for [facility].  Dietary issues and exercise activities discussed: Current Exercise Habits: The patient does not participate in regular exercise at present, Exercise limited by: None identified Diet (meal preparation, eat out, water intake, caffeinated beverages, dairy products, fruits and vegetables): well balanced      Goals    . DIET - EAT MORE FRUITS AND VEGETABLES    . Increase physical activity     Restart swimming/water aerobics this summer.      Depression Screen PHQ 2/9 Scores 10/19/2019 10/18/2018 08/24/2017 08/20/2016 07/15/2015 11/17/2013  PHQ - 2 Score 0 0 0 0 0 0    Fall Risk Fall Risk  10/19/2019 10/18/2018 08/24/2017 10/09/2016 08/20/2016  Falls in the past year? 0 0 No No No  Comment - - - [place] Telephone Survey: data to providers prior to load -  Number falls in past yr: 0 - - - -  Injury with Fall? 0 - - - -  Follow up Education provided;Falls prevention discussed - - - -   Lives alone in 2 story home.  Any stairs in or around the home? Yes  If so, are there any without handrails? No  Home free of loose throw rugs in walkways, pet beds, electrical cords,  etc? Yes  Adequate lighting in your home to reduce risk of falls? Yes   ASSISTIVE DEVICES UTILIZED TO  PREVENT FALLS:  Life alert? No  Use of a cane, walker or w/c? No  Grab bars in the bathroom? Yes  Shower chair or bench in shower? Yes  Elevated toilet seat or a handicapped toilet? No    Cognitive Function: Ad8 score reviewed for issues:  Issues making decisions:no  Less interest in hobbies / activities:no  Repeats questions, stories (family complaining):no  Trouble using ordinary gadgets (microwave, computer, phone):no  Forgets the month or year: no  Mismanaging finances: no  Remembering appts:no  Daily problems with thinking and/or memory:no Ad8 score is=0     MMSE - Mini Mental State Exam 08/20/2016  Orientation to time 5  Orientation to Place 5  Registration 3  Attention/ Calculation 5  Recall 3  Language- name 2 objects 2  Language- repeat 1  Language- follow 3 step command 3  Language- read & follow direction 1  Write a sentence 1  Copy design 1  Total score 30        Immunizations Immunization History  Administered Date(s) Administered  . Fluad Quad(high Dose 65+) 11/03/2018  . Influenza Whole 11/24/2010  . Influenza, Seasonal, Injecte, Preservative Fre 12/28/2014  . Influenza,inj,Quad PF,6+ Mos 11/17/2013  . Influenza-Unspecified 11/17/2012, 11/29/2015, 11/14/2017  . Moderna SARS-COVID-2 Vaccination 04/14/2019, 05/09/2019  . Pneumococcal Conjugate-13 11/17/2013  . Pneumococcal Polysaccharide-23 11/09/2011, 02/12/2017  . Td 05/22/2019  . Tdap 11/24/2010  . Zoster 03/17/2007  . Zoster Recombinat (Shingrix) 10/02/2017, 11/29/2017    TDAP status: Up to date Flu Vaccine status: Up to date Pneumococcal vaccine status: Up to date Covid-19 vaccine status: Completed vaccines  Qualifies for Shingles Vaccine? Yes   Zostavax completed Yes   Shingrix Completed?: Yes  Screening Tests Health Maintenance  Topic Date Due  . FOOT EXAM  Never done    . OPHTHALMOLOGY EXAM  Never done  . MAMMOGRAM  11/23/2012  . DEXA SCAN  Never done  . INFLUENZA VACCINE  10/15/2019  . HEMOGLOBIN A1C  11/15/2019  . COLONOSCOPY  11/23/2020  . TETANUS/TDAP  05/21/2029  . COVID-19 Vaccine  Completed  . Hepatitis C Screening  Completed  . PNA vac Low Risk Adult  Completed    Health Maintenance  Health Maintenance Due  Topic Date Due  . FOOT EXAM  Never done  . OPHTHALMOLOGY EXAM  Never done  . MAMMOGRAM  11/23/2012  . DEXA SCAN  Never done  . INFLUENZA VACCINE  10/15/2019    Pt declines mammogram, bone density, and colonoscopy.   Additional Screening:  Hepatitis C Screening: does qualify; Completed 01/16/16  Vision Screening: Recommended annual ophthalmology exams for early detection of glaucoma and other disorders of the eye. Is the patient up to date with their annual eye exam?  No  pt states she will schedule.  Dental Screening: Recommended annual dental exams for proper oral hygiene  Community Resource Referral / Chronic Care Management: CRR required this visit?  No   CCM required this visit?  No      Plan:    Please schedule your next medicare wellness visit with me in 1 yr.  Continue to eat heart healthy diet (full of fruits, vegetables, whole grains, lean protein, water--limit salt, fat, and sugar intake) and increase physical activity as tolerated.  Continue doing brain stimulating activities (puzzles, reading, adult coloring books, staying active) to keep memory sharp.     I have personally reviewed and noted the following in the patient's chart:   . Medical and social history . Use of alcohol,  tobacco or illicit drugs  . Current medications and supplements . Functional ability and status . Nutritional status . Physical activity . Advanced directives . List of other physicians . Hospitalizations, surgeries, and ER visits in previous 12 months . Vitals . Screenings to include cognitive, depression, and  falls . Referrals and appointments  In addition, I have reviewed and discussed with patient certain preventive protocols, quality metrics, and best practice recommendations. A written personalized care plan for preventive services as well as general preventive health recommendations were provided to patient.   Due to this being a telephonic visit, the after visit summary with patients personalized plan was offered to patient via mail or my-chart.Patient would like to access on my-chart.   Shela Nevin, [place]   10/19/2019

## 2019-10-19 ENCOUNTER — Other Ambulatory Visit: Payer: Self-pay

## 2019-10-19 ENCOUNTER — Encounter: Payer: Self-pay | Admitting: *Deleted

## 2019-10-19 ENCOUNTER — Telehealth: Payer: Self-pay | Admitting: *Deleted

## 2019-10-19 ENCOUNTER — Ambulatory Visit (INDEPENDENT_AMBULATORY_CARE_PROVIDER_SITE_OTHER): Payer: Medicare Other | Admitting: *Deleted

## 2019-10-19 VITALS — BP 118/70

## 2019-10-19 DIAGNOSIS — Z0000 Encounter for general adult medical examination without abnormal findings: Secondary | ICD-10-CM

## 2019-10-19 MED ORDER — OMEPRAZOLE 20 MG PO CPDR: DELAYED_RELEASE_CAPSULE | ORAL | 3 refills | Status: DC

## 2019-10-19 NOTE — Patient Instructions (Signed)
Please schedule your next medicare wellness visit with me in 1 yr.  Continue to eat heart healthy diet (full of fruits, vegetables, whole grains, lean protein, water--limit salt, fat, and sugar intake) and increase physical activity as tolerated.  Continue doing brain stimulating activities (puzzles, reading, adult coloring books, staying active) to keep memory sharp.    Savannah Stanley , Thank you for taking time to come for your Medicare Wellness Visit. I appreciate your ongoing commitment to your health goals. Please review the following plan we discussed and let me know if I can assist you in the future.   These are the goals we discussed: Goals    . DIET - EAT MORE FRUITS AND VEGETABLES    . Increase physical activity     Restart swimming/water aerobics this summer.       This is a list of the screening recommended for you and due dates:  Health Maintenance  Topic Date Due  . Complete foot exam   Never done  . Eye exam for diabetics  Never done  . Mammogram  11/23/2012  . DEXA scan (bone density measurement)  Never done  . Flu Shot  10/15/2019  . Hemoglobin A1C  11/15/2019  . Colon Cancer Screening  11/23/2020  . Tetanus Vaccine  05/21/2029  . COVID-19 Vaccine  Completed  .  Hepatitis C: One time screening is recommended by Center for Disease Control  (CDC) for  adults born from [birth year] through 1967.   Completed  . Pneumonia vaccines  Completed    Preventive Care 47 Years and Older, Female Preventive care refers to lifestyle choices and visits with your health care provider that can promote health and wellness. This includes:  A yearly physical exam. This is also called an annual well check.  Regular dental and eye exams.  Immunizations.  Screening for certain conditions.  Healthy lifestyle choices, such as diet and exercise. What can I expect for my preventive care visit? Physical exam Your health care provider will check:  Height and weight. These may be used to  calculate body mass index (BMI), which is a measurement that tells if you are at a healthy weight.  Heart rate and blood pressure.  Your skin for abnormal spots. Counseling Your health care provider may ask you questions about:  Alcohol, tobacco, and drug use.  Emotional well-being.  Home and relationship well-being.  Sexual activity.  Eating habits.  History of falls.  Memory and ability to understand (cognition).  Work and work Astronomer.  Pregnancy and menstrual history. What immunizations do I need?  Influenza (flu) vaccine  This is recommended every year. Tetanus, diphtheria, and pertussis (Tdap) vaccine  You may need a Td booster every 10 years. Varicella (chickenpox) vaccine  You may need this vaccine if you have not already been vaccinated. Zoster (shingles) vaccine  You may need this after age 14. Pneumococcal conjugate (PCV13) vaccine  One dose is recommended after age 67. Pneumococcal polysaccharide (PPSV23) vaccine  One dose is recommended after age 35. Measles, mumps, and rubella (MMR) vaccine  You may need at least one dose of MMR if you were born in 1957 or later. You may also need a second dose. Meningococcal conjugate (MenACWY) vaccine  You may need this if you have certain conditions. Hepatitis A vaccine  You may need this if you have certain conditions or if you travel or work in places where you may be exposed to hepatitis A. Hepatitis B vaccine  You may need this  if you have certain conditions or if you travel or work in places where you may be exposed to hepatitis B. Haemophilus influenzae type b (Hib) vaccine  You may need this if you have certain conditions. You may receive vaccines as individual doses or as more than one vaccine together in one shot (combination vaccines). Talk with your health care provider about the risks and benefits of combination vaccines. What tests do I need? Blood tests  Lipid and cholesterol levels.  These may be checked every 5 years, or more frequently depending on your overall health.  Hepatitis C test.  Hepatitis B test. Screening  Lung cancer screening. You may have this screening every year starting at age 84 if you have a 30-pack-year history of smoking and currently smoke or have quit within the past 15 years.  Colorectal cancer screening. All adults should have this screening starting at age 55 and continuing until age 48. Your health care provider may recommend screening at age 84 if you are at increased risk. You will have tests every 1-10 years, depending on your results and the type of screening test.  Diabetes screening. This is done by checking your blood sugar (glucose) after you have not eaten for a while (fasting). You may have this done every 1-3 years.  Mammogram. This may be done every 1-2 years. Talk with your health care provider about how often you should have regular mammograms.  BRCA-related cancer screening. This may be done if you have a family history of breast, ovarian, tubal, or peritoneal cancers. Other tests  Sexually transmitted disease (STD) testing.  Bone density scan. This is done to screen for osteoporosis. You may have this done starting at age 62. Follow these instructions at home: Eating and drinking  Eat a diet that includes fresh fruits and vegetables, whole grains, lean protein, and low-fat dairy products. Limit your intake of foods with high amounts of sugar, saturated fats, and salt.  Take vitamin and mineral supplements as recommended by your health care provider.  Do not drink alcohol if your health care provider tells you not to drink.  If you drink alcohol: ? Limit how much you have to 0-1 drink a day. ? Be aware of how much alcohol is in your drink. In the [place] one drink equals one 12 oz bottle of beer (355 mL), one 5 oz glass of wine (148 mL), or one 1 oz glass of hard liquor (44 mL). Lifestyle  Take daily care of your teeth  and gums.  Stay active. Exercise for at least 30 minutes on 5 or more days each week.  Do not use any products that contain nicotine or tobacco, such as cigarettes, e-cigarettes, and chewing tobacco. If you need help quitting, ask your health care provider.  If you are sexually active, practice safe sex. Use a condom or other form of protection in order to prevent STIs (sexually transmitted infections).  Talk with your health care provider about taking a low-dose aspirin or statin. What's next?  Go to your health care provider once a year for a well check visit.  Ask your health care provider how often you should have your eyes and teeth checked.  Stay up to date on all vaccines. This information is not intended to replace advice given to you by your health care provider. Make sure you discuss any questions you have with your health care provider. Document Revised: 02/24/2018 Document Reviewed: 02/24/2018 Elsevier Patient Education  2020 [employer].

## 2019-10-19 NOTE — Telephone Encounter (Signed)
-----   Message from Dennis Bast, RN sent at 10/19/2019  9:07 AM EDT ----- Regarding: rx Pt requesting refill on [facility]eprazole [facility].  Thanks :)

## 2019-10-19 NOTE — Telephone Encounter (Signed)
Patient notified that rx has been sent in. 

## 2019-12-15 ENCOUNTER — Other Ambulatory Visit: Payer: Self-pay | Admitting: Family Medicine

## 2019-12-15 DIAGNOSIS — F32A Depression, unspecified: Secondary | ICD-10-CM

## 2020-03-11 ENCOUNTER — Other Ambulatory Visit: Payer: Self-pay | Admitting: Family Medicine

## 2020-03-11 DIAGNOSIS — F32A Depression, unspecified: Secondary | ICD-10-CM

## 2020-04-26 ENCOUNTER — Other Ambulatory Visit: Payer: Self-pay | Admitting: Medical

## 2020-04-29 ENCOUNTER — Other Ambulatory Visit: Payer: Self-pay | Admitting: Family Medicine

## 2020-05-08 ENCOUNTER — Other Ambulatory Visit: Payer: Self-pay | Admitting: Family Medicine

## 2020-06-19 ENCOUNTER — Other Ambulatory Visit: Payer: Self-pay | Admitting: Family Medicine

## 2020-06-19 DIAGNOSIS — F32A Depression, unspecified: Secondary | ICD-10-CM

## 2020-06-24 ENCOUNTER — Telehealth: Payer: Self-pay | Admitting: Family Medicine

## 2020-06-24 NOTE — Telephone Encounter (Signed)
Caller Savannah Stanley  Call Back @ [phone]  Patient states she would like lab orders place, so that she can continue to get refills of medication.  Patient offered a office visit, patient sates her medication bottle said lab only.   Please advise

## 2020-06-24 NOTE — Telephone Encounter (Signed)
Patient scheduled for 08/22/20 for ov.

## 2020-07-17 ENCOUNTER — Other Ambulatory Visit: Payer: Self-pay | Admitting: Family Medicine

## 2020-07-17 DIAGNOSIS — F32A Depression, unspecified: Secondary | ICD-10-CM

## 2020-08-22 ENCOUNTER — Other Ambulatory Visit: Payer: Self-pay

## 2020-08-22 ENCOUNTER — Ambulatory Visit (INDEPENDENT_AMBULATORY_CARE_PROVIDER_SITE_OTHER): Payer: Medicare Other | Admitting: Family Medicine

## 2020-08-22 ENCOUNTER — Telehealth: Payer: Self-pay | Admitting: *Deleted

## 2020-08-22 VITALS — BP 122/66 | HR 94 | Temp 98.5°F | Resp 16 | Wt 190.3 lb

## 2020-08-22 DIAGNOSIS — E782 Mixed hyperlipidemia: Secondary | ICD-10-CM

## 2020-08-22 DIAGNOSIS — E1169 Type 2 diabetes mellitus with other specified complication: Secondary | ICD-10-CM

## 2020-08-22 DIAGNOSIS — E669 Obesity, unspecified: Secondary | ICD-10-CM | POA: Diagnosis not present

## 2020-08-22 DIAGNOSIS — D649 Anemia, unspecified: Secondary | ICD-10-CM

## 2020-08-22 DIAGNOSIS — I10 Essential (primary) hypertension: Secondary | ICD-10-CM | POA: Diagnosis not present

## 2020-08-22 DIAGNOSIS — R000 Tachycardia, unspecified: Secondary | ICD-10-CM | POA: Diagnosis not present

## 2020-08-22 DIAGNOSIS — R7989 Other specified abnormal findings of blood chemistry: Secondary | ICD-10-CM

## 2020-08-22 DIAGNOSIS — Z780 Asymptomatic menopausal state: Secondary | ICD-10-CM

## 2020-08-22 DIAGNOSIS — J441 Chronic obstructive pulmonary disease with (acute) exacerbation: Secondary | ICD-10-CM

## 2020-08-22 DIAGNOSIS — E2839 Other primary ovarian failure: Secondary | ICD-10-CM

## 2020-08-22 LAB — COMPREHENSIVE METABOLIC PANEL
ALT: 9 U/L (ref 0–35)
AST: 13 U/L (ref 0–37)
Albumin: 4.2 g/dL (ref 3.5–5.2)
Alkaline Phosphatase: 51 U/L (ref 39–117)
BUN: 15 mg/dL (ref 6–23)
CO2: 28 mEq/L (ref 19–32)
Calcium: 9.3 mg/dL (ref 8.4–10.5)
Chloride: 100 mEq/L (ref 96–112)
Creatinine, Ser: 0.59 mg/dL (ref 0.40–1.20)
GFR: 90.32 mL/min (ref >60.00)
Glucose, Bld: 109 mg/dL — ABNORMAL HIGH (ref 70–99)
Potassium: 4.6 mEq/L (ref 3.5–5.1)
Sodium: 138 mEq/L (ref 135–145)
Total Bilirubin: 0.4 mg/dL (ref 0.2–1.2)
Total Protein: 6.5 g/dL (ref 6.0–8.3)

## 2020-08-22 LAB — LIPID PANEL
Cholesterol: 192 mg/dL (ref 0–200)
HDL: 62.8 mg/dL (ref >39.00)
LDL Cholesterol: 104 mg/dL — ABNORMAL HIGH (ref 0–99)
NonHDL: 129.25
Total CHOL/HDL Ratio: 3
Triglycerides: 128 mg/dL (ref 0.0–149.0)
VLDL: 25.6 mg/dL (ref 0.0–40.0)

## 2020-08-22 LAB — CBC
HCT: 36 % (ref 36.0–46.0)
Hemoglobin: 11.2 g/dL — ABNORMAL LOW (ref 12.0–15.0)
MCHC: 31.2 g/dL (ref 30.0–36.0)
MCV: 76.8 fl — ABNORMAL LOW (ref 78.0–100.0)
Platelets: 305 10*3/uL (ref 150.0–400.0)
RBC: 4.68 Mil/uL (ref 3.87–5.11)
RDW: 17.2 % — ABNORMAL HIGH (ref 11.5–15.5)
WBC: 7.2 10*3/uL (ref 4.0–10.5)

## 2020-08-22 LAB — HEMOGLOBIN A1C: Hgb A1c MFr Bld: 7.7 % — ABNORMAL HIGH (ref 4.6–6.5)

## 2020-08-22 LAB — TSH: TSH: 1.09 u[IU]/mL (ref 0.35–4.50)

## 2020-08-22 LAB — VITAMIN D 25 HYDROXY (VIT D DEFICIENCY, FRACTURES): VITD: 102.32 ng/mL (ref 30.00–100.00)

## 2020-08-22 MED ORDER — FLUTICASONE-SALMETEROL 250-50 MCG/ACT IN AEPB: 1.0000 | INHALATION_SPRAY | Freq: Two times a day (BID) | RESPIRATORY_TRACT | 3 refills | Status: DC

## 2020-08-22 NOTE — Progress Notes (Signed)
Patient ID: Savannah Stanley, female    DOB: [birth year]  Age: 70 y.o. MRN: [id number]    Subjective:  Subjective  HPI Savannah Stanley presents for office visit today for follow up on hypertension and type 2 diabetes. Savannah Stanley states that Savannah Stanley did not have any sicknesses or recent ER visits to report, besides the recent sinus infection and nose bleeds Savannah Stanley had experienced. Savannah Stanley states when Savannah Stanley was experiencing nosebleeds, it was bad to the point where Savannah Stanley felt like blood clots were falling down Savannah Stanley throat. Savannah Stanley denies any chest pain, SOB, fever, abdominal pain, cough, chills, sore throat, dysuria, urinary incontinence, back pain, HA, or N/VD. Savannah Stanley endorses taking vitamin D and calcium supplements. Savannah Stanley states that Savannah Stanley was not able to tolerate Symbicort and expresses interest in changing medications. Savannah Stanley reports that Savannah Stanley has trouble sleeping and states that Savannah Stanley cannot tolerate doing a sleep study. Savannah Stanley reports that Savannah Stanley only gets about 4-5 hours of sleep daily and states that Savannah Stanley usually feels sleepy during the day.   Review of Systems  Constitutional:  Negative for chills, fatigue and fever.       (+) sleeping trouble  HENT:  Negative for congestion, rhinorrhea, sinus pressure, sinus pain and sore throat.   Eyes:  Negative for pain.  Respiratory:  Negative for cough and shortness of breath.   Cardiovascular:  Negative for chest pain, palpitations and leg swelling.  Gastrointestinal:  Negative for abdominal pain, blood in stool, diarrhea, nausea and vomiting.  Genitourinary:  Negative for decreased urine volume, flank pain, frequency, vaginal bleeding and vaginal discharge.  Musculoskeletal:  Positive for arthralgias. Negative for back pain.  Neurological:  Negative for headaches.   History Past Medical History:  Diagnosis Date   Acute bronchitis with COPD ([place]) 03/19/2015   Bladder cancer ([place]) UROLOGIST-- DR Diona Fanti   HIGH-GRADE UROTHELIAL CARCINOMA   Bladder tumor "multiple"   "some ORs; some removed in  office"   COPD (chronic obstructive pulmonary disease) ([place])    Depression    Diabetes mellitus type 2 in obese ([place]) 01/31/2015   GERD (gastroesophageal reflux disease)    H/O hiatal hernia    High cholesterol    History of blood transfusion 01/2013 - 02/2013   related to ORs   Hypertension    Iron deficiency anemia    Murmur, heart 05/20/2014   Pneumonia 07/2010; 08/07/2015   Type 2 diabetes mellitus ([place])     Savannah Stanley has a past surgical history that includes Transurethral resection of bladder tumor with gyrus (turbt-gyrus) (N/A, 01/23/2013); Appendectomy (1980's); Tubal ligation (1980's); Transurethral resection of bladder tumor (N/A, 02/16/2013); Transurethral resection of bladder tumor (N/A, 12/18/2013); and Cystoscopy (N/A, 12/18/2013).   Savannah Stanley family history includes Arthritis in Savannah Stanley brother; Cancer in Savannah Stanley father; Dementia in Savannah Stanley brother; Frontotemporal dementia in Savannah Stanley brother.Savannah Stanley reports that Savannah Stanley quit smoking about 10 years ago. Savannah Stanley smoking use included cigarettes. Savannah Stanley has a 35.00 pack-year smoking history. Savannah Stanley has never used smokeless tobacco. Savannah Stanley reports current alcohol use. Savannah Stanley reports that Savannah Stanley does not use drugs.  Current Outpatient Medications on File Prior to Visit  Medication Sig Dispense Refill   albuterol (VENTOLIN HFA) 108 (90 Base) MCG/ACT inhaler INHALE 2 PUFFS BY MOUTH INTO THE LUNGS EVERY 6 HOURS AS NEEDED FOR WHEEZING AND SHORTNESS OF BREATH. GENERIC EQUIVALENT FOR PROAIR HFA 34 g 2   azithromycin (ZITHROMAX) 250 MG tablet As directed 6 tablet 0   Calcium Citrate-Vitamin D (CITRACAL + D PO) Take 2 tablets by mouth 2 (  two) times daily.      citalopram (CELEXA) 40 MG tablet Take 1 tablet (40 mg total) by mouth daily. 30 tablet 1   Coenzyme Q10 (COQ10) 200 MG CAPS Take 1 capsule by mouth daily.     losartan (COZAAR) 50 MG tablet Take 1 tablet (50 mg total) by mouth daily. 90 tablet 1   MELATONIN PO Take 1 tablet by mouth at bedtime.     metFORMIN (GLUCOPHAGE) 500 MG tablet TAKE 2  TABLETS BY MOUTH IN THE MORNING AND 1 TABLET IN THE EVENING 270 tablet 1   metoprolol succinate (TOPROL-XL) 25 MG 24 hr tablet Take 1 tablet (25 mg total) by mouth daily. 90 tablet 1   Multiple Vitamins-Minerals (ZINC PO) Take 1 tablet by mouth daily.     omeprazole (PRILOSEC) 20 MG capsule TAKE 1 CAPSULE BY MOUTH TWO TIMES DAILY AS NEEDED 180 capsule 3   pravastatin (PRAVACHOL) 10 MG tablet Take 1 tablet (10 mg total) by mouth daily. 90 tablet 1   Probiotic Product (PROBIOTIC PO) Take 1 capsule by mouth daily.     tobramycin (TOBREX) 0.3 % ophthalmic solution INSTILL 2 DROPS INTO RIGHT EYE EVERY 6 HOURS 5 mL 0   aspirin EC 81 MG tablet Take 1 tablet (81 mg total) by mouth daily. Swallow whole. (Patient not taking: No sig reported) 30 tablet 11   EPINEPHrine 0.3 mg/0.3 mL IJ SOAJ injection Inject 0.3 mLs (0.3 mg total) into the muscle once. (Patient not taking: No sig reported) 1 Device 1   guaiFENesin (MUCINEX) 600 MG 12 hr tablet Take 1 tablet (600 mg total) by mouth 2 (two) times daily. (Patient not taking: No sig reported) 20 tablet 0   Current Facility-Administered Medications on File Prior to Visit  Medication Dose Route Frequency Provider Last Rate Last Admin   mitoMYcin (MUTAMYCIN) chemo injection 40 mg  40 mg Bladder Instillation Once Franchot Gallo, MD         Objective:  Objective  Physical Exam Constitutional:      General: Savannah Stanley is not in acute distress.    Appearance: Normal appearance. Savannah Stanley is not ill-appearing or toxic-appearing.  HENT:     Head: Normocephalic and atraumatic.     Right Ear: Tympanic membrane, ear canal and external ear normal.     Left Ear: Tympanic membrane, ear canal and external ear normal.     Nose: No congestion or rhinorrhea.  Eyes:     Extraocular Movements: Extraocular movements intact.     Pupils: Pupils are equal, round, and reactive to light.  Cardiovascular:     Rate and Rhythm: Normal rate and regular rhythm.     Pulses: Normal pulses.      Heart sounds: Normal heart sounds. No murmur heard. Pulmonary:     Effort: Pulmonary effort is normal. No respiratory distress.     Breath sounds: Normal breath sounds. No wheezing, rhonchi or rales.  Abdominal:     General: Bowel sounds are normal.     Palpations: Abdomen is soft. There is no mass.     Tenderness: no abdominal tenderness There is no guarding.     Hernia: No hernia is present.  Musculoskeletal:        General: Normal range of motion.     Cervical back: Normal range of motion and neck supple.  Skin:    General: Skin is warm and dry.  Neurological:     Mental Status: Savannah Stanley is alert and oriented to person, place, and time.  Psychiatric:        Behavior: Behavior normal.   BP 122/66   Pulse 94   Temp 98.5 F (36.9 C)   Resp 16   Wt 190 lb 4.8 oz (86.3 kg)   SpO2 98%   BMI 33.71 kg/m  Wt Readings from Last 3 Encounters:  08/22/20 190 lb 4.8 oz (86.3 kg)  09/20/19 195 lb (88.5 kg)  05/15/19 192 lb (87.1 kg)     Lab Results  Component Value Date   WBC 7.2 08/22/2020   HGB 11.2 (L) 08/22/2020   HCT 36.0 08/22/2020   PLT 305.0 08/22/2020   GLUCOSE 109 (H) 08/22/2020   CHOL 192 08/22/2020   TRIG 128.0 08/22/2020   HDL 62.80 08/22/2020   LDLCALC 104 (H) 08/22/2020   ALT 9 08/22/2020   AST 13 08/22/2020   NA 138 08/22/2020   K 4.6 08/22/2020   CL 100 08/22/2020   CREATININE 0.59 08/22/2020   BUN 15 08/22/2020   CO2 28 08/22/2020   TSH 1.09 08/22/2020   INR 1.04 01/18/2013   HGBA1C 7.7 (H) 08/22/2020   MICROALBUR <0.7 07/15/2015    DG Chest 2 View  Result Date: 08/27/2015 CLINICAL DATA:  Recent pneumonia.  Hypertension. EXAM: CHEST  2 VIEW COMPARISON:  Aug 07, 2015. FINDINGS: There has been interval clearing of left lower lobe airspace consolidation. There is minimal atelectasis in the left lower lobe currently. Lungs elsewhere clear. Heart size and pulmonary vascularity are normal. No adenopathy. There is a focal hiatal hernia. There is degenerative  change in the upper lumbar spine. IMPRESSION: Interval clearing of left lower lobe airspace consolidation with only minimal left lower lobe atelectasis currently. Lungs elsewhere clear. Stable cardiac silhouette. Hiatal hernia present. Electronically Signed   By: Lowella Grip III M.D.   On: 08/27/2015 08:19     Assessment & Plan:  Plan    Meds ordered this encounter  Medications   fluticasone-salmeterol (ADVAIR) 250-50 MCG/ACT AEPB    Sig: Inhale 1 puff into the lungs in the morning and at bedtime.    Dispense:  60 each    Refill:  3    Problem List Items Addressed This Visit     COPD (chronic obstructive pulmonary disease) ([place])    Savannah Stanley still notes episodes of wheezing and needs Savannah Stanley Albuterol most days. Will try a switch to Advair 250/50 from Symbicort. Savannah Stanley has also failed Breo. Savannah Stanley declines referral to pulmonology at this time.       Relevant Medications   fluticasone-salmeterol (ADVAIR) 250-50 MCG/ACT AEPB   Anemia - Primary   Relevant Orders   CBC (Completed)   TSH (Completed)   Hypertension    Well controlled, no changes to meds. Encouraged heart healthy diet such as the DASH diet and exercise as tolerated.        Relevant Orders   CBC (Completed)   Comprehensive metabolic panel (Completed)   TSH (Completed)   Hyperlipidemia    Tolerating statin, encouraged heart healthy diet, avoid trans fats, minimize simple carbs and saturated fats. Increase exercise as tolerated       Relevant Orders   Lipid panel (Completed)   Tachycardia    RRR today, asymptomatic       Diabetes mellitus type 2 in obese ([facility])    hgba1c acceptable, minimize simple carbs. Increase exercise as tolerated. Continue current meds       Relevant Orders   Hemoglobin A1c (Completed)   Other Visit Diagnoses  Low vitamin D level       Relevant Orders   VITAMIN D 25 Hydroxy (Vit-D Deficiency, Fractures) (Completed)   Post-menopause       Relevant Orders   DG Bone Density    Estrogen deficiency       Relevant Orders   DG Bone Density       Follow-up: Return in about 3 months (around 11/22/2020), or 3-4 mn f/u COPD.   I,David Hanna,acting as a scribe for Penni Homans, MD.,have documented all relevant documentation on the behalf of Penni Homans, MD,as directed by  Penni Homans, MD while in the presence of Penni Homans, MD.  I, [name], MD personally performed the services described in this documentation. All medical record entries made by the scribe were at my direction and in my presence. I have reviewed the chart and agree that the record reflects my personal performance and is accurate and complete

## 2020-08-22 NOTE — Telephone Encounter (Signed)
CRITICAL VALUE STICKER  CRITICAL VALUE:  Vitamin D  102.32  RECEIVER (on-site recipient of call): Kelle Darting, [place] NOTIFIED: 08/22/20 @ 4:28pm  MESSENGER (representative from lab): Saa  MD NOTIFIED:  Penni Homans, MD  TIME OF NOTIFICATION: 4:30pm  RESPONSE:

## 2020-08-22 NOTE — Patient Instructions (Signed)
Encouraged good sleep hygiene such as dark, quiet room. No blue/green glowing lights such as computer screens in bedroom. No alcohol or stimulants in evening. Cut down on caffeine as able. Regular exercise is helpful but not just prior to bed time.    Melatonin 2-20 mg daily Can add Magnesium Glycinate 200 to 400 mg at bedtime (Solaray)(MindBodyGreen) L Tryptophan for sleep Benadryl for a really bad night 25 mg  Unisom can Insomnia Insomnia is a sleep disorder that makes it difficult to fall asleep or stay asleep. Insomnia can cause fatigue, low energy, difficulty concentrating, mood swings, and poor performance at work or school. There are three different ways to classify insomnia: Difficulty falling asleep. Difficulty staying asleep. Waking up too early in the morning. Any type of insomnia can be long-term (chronic) or short-term (acute). Both are common. Short-term insomnia usually lasts for three months or less. Chronic insomnia occurs at least three times a week for longer than three months. What are the causes? Insomnia may be caused by another condition, situation, or substance, such as: Anxiety. Certain medicines. Gastroesophageal reflux disease (GERD) or other gastrointestinal conditions. Asthma or other breathing conditions. Restless legs syndrome, sleep apnea, or other sleep disorders. Chronic pain. Menopause. Stroke. Abuse of alcohol, tobacco, or illegal drugs. Mental health conditions, such as depression. Caffeine. Neurological disorders, such as Alzheimer's disease. An overactive thyroid (hyperthyroidism). Sometimes, the cause of insomnia may not be known. What increases the risk? Risk factors for insomnia include: Gender. Women are affected more often than men. Age. Insomnia is more common as you get older. Stress. Lack of exercise. Irregular work schedule or working night shifts. Traveling between different time zones. Certain medical and mental health  conditions. What are the signs or symptoms? If you have insomnia, the main symptom is having trouble falling asleep or having trouble staying asleep. This may lead to other symptoms, such as: Feeling fatigued or having low energy. Feeling nervous about going to sleep. Not feeling rested in the morning. Having trouble concentrating. Feeling irritable, anxious, or depressed. How is this diagnosed? This condition may be diagnosed based on: Your symptoms and medical history. Your health care provider may ask about: Your sleep habits. Any medical conditions you have. Your mental health. A physical exam. How is this treated? Treatment for insomnia depends on the cause. Treatment may focus on treating an underlying condition that is causing insomnia. Treatment may also include: Medicines to help you sleep. Counseling or therapy. Lifestyle adjustments to help you sleep better. Follow these instructions at home: Eating and drinking Limit or avoid alcohol, caffeinated beverages, and cigarettes, especially close to bedtime. These can disrupt your sleep. Do not eat a large meal or eat spicy foods right before bedtime. This can lead to digestive discomfort that can make it hard for you to sleep.   Sleep habits Keep a sleep diary to help you and your health care provider figure out what could be causing your insomnia. Write down: When you sleep. When you wake up during the night. How well you sleep. How rested you feel the next day. Any side effects of medicines you are taking. What you eat and drink. Make your bedroom a dark, comfortable place where it is easy to fall asleep. Put up shades or blackout curtains to block light from outside. Use a white noise machine to block noise. Keep the temperature cool. Limit screen use before bedtime. This includes: Watching TV. Using your smartphone, tablet, or computer. Stick to a routine that includes  going to bed and waking up at the same times  every day and night. This can help you fall asleep faster. Consider making a quiet activity, such as reading, part of your nighttime routine. Try to avoid taking naps during the day so that you sleep better at night. Get out of bed if you are still awake after 15 minutes of trying to sleep. Keep the lights down, but try reading or doing a quiet activity. When you feel sleepy, go back to bed.   General instructions Take over-the-counter and prescription medicines only as told by your health care provider. Exercise regularly, as told by your health care provider. Avoid exercise starting several hours before bedtime. Use relaxation techniques to manage stress. Ask your health care provider to suggest some techniques that may work well for you. These may include: Breathing exercises. Routines to release muscle tension. Visualizing peaceful scenes. Make sure that you drive carefully. Avoid driving if you feel very sleepy. Keep all follow-up visits as told by your health care provider. This is important. Contact a health care provider if: You are tired throughout the day. You have trouble in your daily routine due to sleepiness. You continue to have sleep problems, or your sleep problems get worse. Get help right away if: You have serious thoughts about hurting yourself or someone else. If you ever feel like you may hurt yourself or others, or have thoughts about taking your own life, get help right away. You can go to your nearest emergency department or call: Your local emergency services (911 in the [place] A suicide crisis helpline, such as the [place] at 410-166-3012. This is open 24 hours a day. Summary Insomnia is a sleep disorder that makes it difficult to fall asleep or stay asleep. Insomnia can be long-term (chronic) or short-term (acute). Treatment for insomnia depends on the cause. Treatment may focus on treating an underlying condition that is causing  insomnia. Keep a sleep diary to help you and your health care provider figure out what could be causing your insomnia. This information is not intended to replace advice given to you by your health care provider. Make sure you discuss any questions you have with your health care provider. Document Revised: 01/11/2020 Document Reviewed: 01/11/2020 Elsevier Patient Education  2021 [employer].

## 2020-08-23 ENCOUNTER — Other Ambulatory Visit: Payer: Self-pay | Admitting: *Deleted

## 2020-08-23 DIAGNOSIS — R7989 Other specified abnormal findings of blood chemistry: Secondary | ICD-10-CM

## 2020-08-23 NOTE — Telephone Encounter (Signed)
See labs 

## 2020-08-25 NOTE — Assessment & Plan Note (Signed)
Tolerating statin, encouraged heart healthy diet, avoid trans fats, minimize simple carbs and saturated fats. Increase exercise as tolerated 

## 2020-08-25 NOTE — Assessment & Plan Note (Signed)
hgba1c acceptable, minimize simple carbs. Increase exercise as tolerated. Continue current meds 

## 2020-08-25 NOTE — Assessment & Plan Note (Signed)
Well controlled, no changes to meds. Encouraged heart healthy diet such as the DASH diet and exercise as tolerated.  °

## 2020-08-25 NOTE — Assessment & Plan Note (Signed)
She still notes episodes of wheezing and needs her Albuterol most days. Will try a switch to Advair 250/50 from Symbicort. She has also failed Breo. She declines referral to pulmonology at this time.

## 2020-08-25 NOTE — Assessment & Plan Note (Signed)
RRR today, asymptomatic

## 2020-08-31 ENCOUNTER — Other Ambulatory Visit: Payer: Self-pay | Admitting: Family Medicine

## 2020-08-31 DIAGNOSIS — F32A Depression, unspecified: Secondary | ICD-10-CM

## 2020-09-10 ENCOUNTER — Ambulatory Visit (HOSPITAL_BASED_OUTPATIENT_CLINIC_OR_DEPARTMENT_OTHER)
Admission: RE | Admit: 2020-09-10 | Discharge: 2020-09-10 | Disposition: A | Discharge status: Home / Self Care | Payer: Medicare Other | Source: Ambulatory Visit | Attending: Family Medicine | Admitting: Family Medicine

## 2020-09-10 ENCOUNTER — Other Ambulatory Visit: Payer: Self-pay

## 2020-09-10 DIAGNOSIS — E2839 Other primary ovarian failure: Secondary | ICD-10-CM | POA: Insufficient documentation

## 2020-09-10 DIAGNOSIS — Z780 Asymptomatic menopausal state: Secondary | ICD-10-CM | POA: Insufficient documentation

## 2020-09-18 DIAGNOSIS — C678 Malignant neoplasm of overlapping sites of bladder: Secondary | ICD-10-CM | POA: Diagnosis not present

## 2020-09-19 ENCOUNTER — Other Ambulatory Visit (INDEPENDENT_AMBULATORY_CARE_PROVIDER_SITE_OTHER): Payer: Medicare Other

## 2020-09-19 ENCOUNTER — Other Ambulatory Visit: Payer: Self-pay

## 2020-09-19 DIAGNOSIS — R7989 Other specified abnormal findings of blood chemistry: Secondary | ICD-10-CM | POA: Diagnosis not present

## 2020-09-19 LAB — VITAMIN D 25 HYDROXY (VIT D DEFICIENCY, FRACTURES): VITD: 88.99 ng/mL (ref 30.00–100.00)

## 2020-09-23 ENCOUNTER — Other Ambulatory Visit: Payer: Medicare Other

## 2020-10-10 ENCOUNTER — Other Ambulatory Visit: Payer: Self-pay | Admitting: Family Medicine

## 2020-10-10 DIAGNOSIS — F32A Depression, unspecified: Secondary | ICD-10-CM

## 2020-10-21 ENCOUNTER — Ambulatory Visit (INDEPENDENT_AMBULATORY_CARE_PROVIDER_SITE_OTHER): Payer: Medicare Other

## 2020-10-21 VITALS — BP 121/64 | Ht 63.0 in | Wt 190.0 lb

## 2020-10-21 DIAGNOSIS — Z0000 Encounter for general adult medical examination without abnormal findings: Secondary | ICD-10-CM | POA: Diagnosis not present

## 2020-10-21 NOTE — Patient Instructions (Signed)
[name] , Thank you for taking time to complete your Medicare Wellness Visit. I appreciate your ongoing commitment to your health goals. Please review the following plan we discussed and let me know if I can assist you in the future.   Screening recommendations/referrals: Colonoscopy: Declined Mammogram: Declined Bone Density: Completed 09/10/2020-Due 09/11/2022 Recommended yearly ophthalmology/optometry visit for glaucoma screening and checkup Recommended yearly dental visit for hygiene and checkup  Vaccinations: Influenza vaccine: Due 11/2020 Pneumococcal vaccine: Up to date Tdap vaccine: Up to date-Due-05/21/2029 Shingles vaccine: Completed vaccines   Covid-19:Up to date  Advanced directives: Please bring a copy for your chart  Conditions/risks identified: See problem list  Next appointment: Follow up in one year for your annual wellness visit 10/24/2019 @ 2:20   Preventive Care 16 Years and Older, Female Preventive care refers to lifestyle choices and visits with your health care provider that can promote health and wellness. What does preventive care include? A yearly physical exam. This is also called an annual well check. Dental exams once or twice a year. Routine eye exams. Ask your health care provider how often you should have your eyes checked. Personal lifestyle choices, including: Daily care of your teeth and gums. Regular physical activity. Eating a healthy diet. Avoiding tobacco and drug use. Limiting alcohol use. Practicing safe sex. Taking low-dose aspirin every day. Taking vitamin and mineral supplements as recommended by your health care provider. What happens during an annual well check? The services and screenings done by your health care provider during your annual well check will depend on your age, overall health, lifestyle risk factors, and family history of disease. Counseling  Your health care provider may ask you questions about your: Alcohol  use. Tobacco use. Drug use. Emotional well-being. Home and relationship well-being. Sexual activity. Eating habits. History of falls. Memory and ability to understand (cognition). Work and work Statistician. Reproductive health. Screening  You may have the following tests or measurements: Height, weight, and BMI. Blood pressure. Lipid and cholesterol levels. These may be checked every 5 years, or more frequently if you are over 42 years old. Skin check. Lung cancer screening. You may have this screening every year starting at age 79 if you have a 30-pack-year history of smoking and currently smoke or have quit within the past 15 years. Fecal occult blood test (FOBT) of the stool. You may have this test every year starting at age 5. Flexible sigmoidoscopy or colonoscopy. You may have a sigmoidoscopy every 5 years or a colonoscopy every 10 years starting at age 34. Hepatitis C blood test. Hepatitis B blood test. Sexually transmitted disease (STD) testing. Diabetes screening. This is done by checking your blood sugar (glucose) after you have not eaten for a while (fasting). You may have this done every 1-3 years. Bone density scan. This is done to screen for osteoporosis. You may have this done starting at age 74. Mammogram. This may be done every 1-2 years. Talk to your health care provider about how often you should have regular mammograms. Talk with your health care provider about your test results, treatment options, and if necessary, the need for more tests. Vaccines  Your health care provider may recommend certain vaccines, such as: Influenza vaccine. This is recommended every year. Tetanus, diphtheria, and acellular pertussis (Tdap, Td) vaccine. You may need a Td booster every 10 years. Zoster vaccine. You may need this after age 37. Pneumococcal 13-valent conjugate (PCV13) vaccine. One dose is recommended after age 35. Pneumococcal polysaccharide (PPSV23) vaccine.  One dose is  recommended after age 36. Talk to your health care provider about which screenings and vaccines you need and how often you need them. This information is not intended to replace advice given to you by your health care provider. Make sure you discuss any questions you have with your health care provider. Document Released: 03/29/2015 Document Revised: 11/20/2015 Document Reviewed: 01/01/2015 Elsevier Interactive Patient Education  2017 [employer] Prevention in the Home Falls can cause injuries. They can happen to people of all ages. There are many things you can do to make your home safe and to help prevent falls. What can I do on the outside of my home? Regularly fix the edges of walkways and driveways and fix any cracks. Remove anything that might make you trip as you walk through a door, such as a raised step or threshold. Trim any bushes or trees on the path to your home. Use bright outdoor lighting. Clear any walking paths of anything that might make someone trip, such as rocks or tools. Regularly check to see if handrails are loose or broken. Make sure that both sides of any steps have handrails. Any raised decks and porches should have guardrails on the edges. Have any leaves, snow, or ice cleared regularly. Use sand or salt on walking paths during winter. Clean up any spills in your garage right away. This includes oil or grease spills. What can I do in the bathroom? Use night lights. Install grab bars by the toilet and in the tub and shower. Do not use towel bars as grab bars. Use non-skid mats or decals in the tub or shower. If you need to sit down in the shower, use a plastic, non-slip stool. Keep the floor dry. Clean up any water that spills on the floor as soon as it happens. Remove soap buildup in the tub or shower regularly. Attach bath mats securely with double-sided non-slip rug tape. Do not have throw rugs and other things on the floor that can make you  trip. What can I do in the bedroom? Use night lights. Make sure that you have a light by your bed that is easy to reach. Do not use any sheets or blankets that are too big for your bed. They should not hang down onto the floor. Have a firm chair that has side arms. You can use this for support while you get dressed. Do not have throw rugs and other things on the floor that can make you trip. What can I do in the kitchen? Clean up any spills right away. Avoid walking on wet floors. Keep items that you use a lot in easy-to-reach places. If you need to reach something above you, use a strong step stool that has a grab bar. Keep electrical cords out of the way. Do not use floor polish or wax that makes floors slippery. If you must use wax, use non-skid floor wax. Do not have throw rugs and other things on the floor that can make you trip. What can I do with my stairs? Do not leave any items on the stairs. Make sure that there are handrails on both sides of the stairs and use them. Fix handrails that are broken or loose. Make sure that handrails are as long as the stairways. Check any carpeting to make sure that it is firmly attached to the stairs. Fix any carpet that is loose or worn. Avoid having throw rugs at the top or bottom of  the stairs. If you do have throw rugs, attach them to the floor with carpet tape. Make sure that you have a light switch at the top of the stairs and the bottom of the stairs. If you do not have them, ask someone to add them for you. What else can I do to help prevent falls? Wear shoes that: Do not have high heels. Have rubber bottoms. Are comfortable and fit you well. Are closed at the toe. Do not wear sandals. If you use a stepladder: Make sure that it is fully opened. Do not climb a closed stepladder. Make sure that both sides of the stepladder are locked into place. Ask someone to hold it for you, if possible. Clearly mark and make sure that you can  see: Any grab bars or handrails. First and last steps. Where the edge of each step is. Use tools that help you move around (mobility aids) if they are needed. These include: Canes. Walkers. Scooters. Crutches. Turn on the lights when you go into a dark area. Replace any light bulbs as soon as they burn out. Set up your furniture so you have a clear path. Avoid moving your furniture around. If any of your floors are uneven, fix them. If there are any pets around you, be aware of where they are. Review your medicines with your doctor. Some medicines can make you feel dizzy. This can increase your chance of falling. Ask your doctor what other things that you can do to help prevent falls. This information is not intended to replace advice given to you by your health care provider. Make sure you discuss any questions you have with your health care provider. Document Released: 12/27/2008 Document Revised: 08/08/2015 Document Reviewed: 04/06/2014 Elsevier Interactive Patient Education  2017 [employer].

## 2020-10-21 NOTE — Progress Notes (Signed)
Subjective:   Savannah Stanley is a 70 y.o. female who presents for Medicare Annual (Subsequent) preventive examination.  I connected with Savannah Stanley today by telephone and verified that I am speaking with the correct person using two identifiers. Location patient: home Location provider: work Persons participating in the virtual visit: patient, Marine scientist.    I discussed the limitations, risks, security and privacy concerns of performing an evaluation and management service by telephone and the availability of in person appointments. I also discussed with the patient that there may be a patient responsible charge related to this service. The patient expressed understanding and verbally consented to this telephonic visit.    Interactive audio and video telecommunications were attempted between this provider and patient, however failed, due to patient having technical difficulties OR patient did not have access to video capability.  We continued and completed visit with audio only.  Some vital signs may be absent or patient reported.   Time Spent with patient on telephone encounter: 25 minutes   Review of Systems     Cardiac Risk Factors include: advanced age (>6mn, >>8women);diabetes mellitus;dyslipidemia;hypertension;obesity (BMI >30kg/m2);sedentary lifestyle     Objective:    Today's Vitals   10/21/20 1421  BP: 121/64  Weight: 190 lb (86.2 kg)  Height: '5\' 3"'$  (1.6 m)   Body mass index is 33.66 kg/m.  Advanced Directives 10/21/2020 10/19/2019 10/18/2018 08/24/2017 08/07/2015 11/17/2013 02/16/2013  Does Patient Have a Medical Advance Directive? Yes No No Yes No No;Yes Patient has advance directive, copy not in chart  Type of Advance Directive [place] will - - [place] will - [place] will -  Does patient want to make changes to medical advance directive? - - - - - - -  Copy of [place] Chart?  No - copy requested - - No - copy requested - No - copy requested -  Would patient like information on creating a medical advance directive? - No - Patient declined No - Patient declined - Yes - Educational materials given No - patient declined information -  Pre-existing out of facility DNR order (yellow form or pink MOST form) - - - - - - -    Current Medications (verified) Outpatient Encounter Medications as of 10/21/2020  Medication Sig   albuterol (VENTOLIN HFA) 108 (90 Base) MCG/ACT inhaler INHALE 2 PUFFS BY MOUTH INTO THE LUNGS EVERY 6 HOURS AS NEEDED FOR WHEEZING AND SHORTNESS OF BREATH. GENERIC EQUIVALENT FOR PROAIR HFA   aspirin EC 81 MG tablet Take 1 tablet (81 mg total) by mouth daily. Swallow whole.   Calcium Citrate-Vitamin D (CITRACAL + D PO) Take 2 tablets by mouth 2 (two) times daily.    citalopram (CELEXA) 40 MG tablet Take 1 tablet by mouth once daily   Coenzyme Q10 (COQ10) 200 MG CAPS Take 1 capsule by mouth daily.   fluticasone-salmeterol (ADVAIR) 250-50 MCG/ACT AEPB Inhale 1 puff into the lungs in the morning and at bedtime.   losartan (COZAAR) 50 MG tablet Take 1 tablet (50 mg total) by mouth daily.   MELATONIN PO Take 1 tablet by mouth at bedtime.   metFORMIN (GLUCOPHAGE) 500 MG tablet TAKE 2 TABLETS BY MOUTH IN THE MORNING AND 1 TABLET IN THE EVENING   metoprolol succinate (TOPROL-XL) 25 MG 24 hr tablet Take 1 tablet (25 mg total) by mouth daily.   Multiple Vitamins-Minerals (ZINC PO) Take 1 tablet by mouth daily.   omeprazole (  PRILOSEC) 20 MG capsule TAKE 1 CAPSULE BY MOUTH TWO TIMES DAILY AS NEEDED   pravastatin (PRAVACHOL) 10 MG tablet Take 1 tablet (10 mg total) by mouth daily.   Probiotic Product (PROBIOTIC PO) Take 1 capsule by mouth daily.   EPINEPHrine 0.3 mg/0.3 mL IJ SOAJ injection Inject 0.3 mLs (0.3 mg total) into the muscle once. (Patient not taking: Reported on 10/21/2020)   guaiFENesin (MUCINEX) 600 MG 12 hr tablet Take 1 tablet (600 mg total) by mouth 2  (two) times daily. (Patient not taking: No sig reported)   [DISCONTINUED] azithromycin (ZITHROMAX) 250 MG tablet As directed   [DISCONTINUED] tobramycin (TOBREX) 0.3 % ophthalmic solution INSTILL 2 DROPS INTO RIGHT EYE EVERY 6 HOURS   Facility-Administered Encounter Medications as of 10/21/2020  Medication   mitoMYcin (MUTAMYCIN) chemo injection 40 mg    Allergies (verified) Bee venom, Other, and Latex   History: Past Medical History:  Diagnosis Date   Acute bronchitis with COPD ([place]) 03/19/2015   Bladder cancer ([place]) UROLOGIST-- DR Diona Fanti   HIGH-GRADE UROTHELIAL CARCINOMA   Bladder tumor "multiple"   "some ORs; some removed in office"   COPD (chronic obstructive pulmonary disease) ([place])    Depression    Diabetes mellitus type 2 in obese ([place]) 01/31/2015   GERD (gastroesophageal reflux disease)    H/O hiatal hernia    High cholesterol    History of blood transfusion 01/2013 - 02/2013   related to ORs   Hypertension    Iron deficiency anemia    Murmur, heart 05/20/2014   Pneumonia 07/2010; 08/07/2015   Type 2 diabetes mellitus [facility])    Past Surgical History:  Procedure Laterality Date   APPENDECTOMY  1980's   CYSTOSCOPY N/A 12/18/2013   Procedure: CYSTOSCOPY;  Surgeon: Jorja Loa, MD;  Location: [facility];  Service: Urology;  Laterality: N/A;   TRANSURETHRAL RESECTION OF BLADDER TUMOR N/A 02/16/2013   Procedure: TRANSURETHRAL RESECTION OF BLADDER TUMOR (TURBT);  Surgeon: Franchot Gallo, MD;  Location[facility];  Service: Urology;  Laterality: N/A;   TRANSURETHRAL RESECTION OF BLADDER TUMOR N/A 12/18/2013   Procedure: TRANSURETHRAL RESECTION OF BLADDER TUMOR (TURBT) ;  Surgeon: Jorja Loa, MD;  Location: [facility];  Service: Urology;  Laterality: N/A;   TRANSURETHRAL RESECTION OF BLADDER TUMOR WITH GYRUS (TURBT-GYRUS) N/A 01/23/2013   Procedure: TRANSURETHRAL RESECTION OF BLADDER TUMOR WITH GYRUS ;  Surgeon:  Franchot Gallo, MD;  Location: [facility];  Service: Urology;  Laterality: N/A;   TUBAL LIGATION  1980's   Family History  Problem Relation Age of Onset   Cancer Father        prostate   Arthritis Brother    Frontotemporal dementia Brother    Dementia Brother    Social History   Socioeconomic History   Marital status: Widowed    Spouse name: Not on file   Number of children: Not on file   Years of education: Not on file   Highest education level: Not on file  Occupational History   Not on file  Tobacco Use   Smoking status: Former    Packs/day: 1.00    Years: 35.00    Pack years: 35.00    Types: Cigarettes    Quit date: 07/15/2010    Years since quitting: 10.2   Smokeless tobacco: Never  Substance and Sexual Activity   Alcohol use: Yes    Comment: wine socially    Drug use: No   Sexual activity: Not  on file  Other Topics Concern   Not on file  Social History Narrative   Not on file   Social Determinants of Health   Financial Resource Strain: Low Risk    Difficulty of Paying Living Expenses: Not hard at all  Food Insecurity: No Food Insecurity   Worried About Charity fundraiser in the Last Year: Never true   [place] in the Last Year: Never true  Transportation Needs: No Transportation Needs   Lack of Transportation (Medical): No   Lack of Transportation (Non-Medical): No  Physical Activity: Inactive   Days of Exercise per Week: 0 days   Minutes of Exercise per Session: 0 min  Stress: No Stress Concern Present   Feeling of Stress : Only a little  Social Connections: Moderately Isolated   Frequency of Communication with Friends and Family: More than three times a week   Frequency of Social Gatherings with Friends and Family: More than three times a week   Attends Religious Services: More than 4 times per year   Active Member of [employer] or Organizations: No   Attends Archivist Meetings: Never   Marital Status: Widowed    Tobacco  Counseling Counseling given: Not Answered   Clinical Intake:  Pre-visit preparation completed: Yes  Pain : No/denies pain     Nutritional Status: BMI > 30  Obese Nutritional Risks: None Diabetes: Yes CBG done?: No Did pt. bring in CBG monitor from home?: No (phone visit)  How often do you need to have someone help you when you read instructions, pamphlets, or other written materials from your doctor or pharmacy?: 1 - Never Diabetes:  Is the patient diabetic?  Yes  If diabetic, was a CBG obtained today?  No  Did the patient bring in their glucometer from home?  No phone viist How often do you monitor your CBG's? never.   Financial Strains and Diabetes Management:  Are you having any financial strains with the device, your supplies or your medication? No .  Does the patient want to be seen by Chronic Care Management for management of their diabetes?  No  Would the patient like to be referred to a Nutritionist or for Diabetic Management?  No   Diabetic Exams:  Diabetic Eye Exam: . Overdue for diabetic eye exam. Pt has been advised about the importance in completing this exam. Patient advised to make an appt  Diabetic Foot Exam: Pt has been advised about the importance in completing this exam. To be completed by PCP.       Information entered by :: Caroleen Hamman LPN   Activities of Daily Living In your present state of health, do you have any difficulty performing the following activities: 10/21/2020  Hearing? N  Vision? N  Difficulty concentrating or making decisions? N  Walking or climbing stairs? N  Dressing or bathing? N  Doing errands, shopping? N  Preparing Food and eating ? N  Using the Toilet? N  In the past six months, have you accidently leaked urine? N  Do you have problems with loss of bowel control? N  Managing your Medications? N  Managing your Finances? N  Housekeeping or managing your Housekeeping? N  Some recent data might be hidden    Patient  Care Team: [name], MD as PCP - General (Family Medicine) Franchot Gallo, MD as Consulting Physician (Urology)  Indicate any recent Medical Services you may have received from other than [facility] providers in the  past year (date may be approximate).     Assessment:   This is a routine wellness examination for [facility].  Hearing/Vision screen Hearing Screening - Comments:: No issues Vision Screening - Comments:: Reading glasses Last eye exam-&quot;many years ago&quot;  Dietary issues and exercise activities discussed: Current Exercise Habits: The patient does not participate in regular exercise at present, Exercise limited by: None identified   Goals Addressed             This Visit's Progress    DIET - EAT MORE FRUITS AND VEGETABLES   On track    Increase physical activity   Not on track    Restart swimming/water aerobics this summer.        Depression Screen PHQ 2/9 Scores 10/21/2020 10/19/2019 10/18/2018 08/24/2017 08/20/2016 07/15/2015 11/17/2013  PHQ - 2 Score 0 0 0 0 0 0 0    Fall Risk Fall Risk  10/21/2020 10/19/2019 10/18/2018 08/24/2017 10/09/2016  Falls in the past year? 0 0 0 No No  Comment - - - - Emmi Telephone Survey: data to providers prior to load  Number falls in past yr: 0 0 - - -  Injury with Fall? 0 0 - - -  Follow up Falls prevention discussed Education provided;Falls prevention discussed - - -    FALL RISK PREVENTION PERTAINING TO THE HOME:  Any stairs in or around the home? Yes  If so, are there any without handrails? No  Home free of loose throw rugs in walkways, pet beds, electrical cords, etc? Yes  Adequate lighting in your home to reduce risk of falls? Yes   ASSISTIVE DEVICES UTILIZED TO PREVENT FALLS:  Life alert? No  Use of a cane, walker or w/c? No  Grab bars in the bathroom? No  Shower chair or bench in shower? No  Elevated toilet seat or a handicapped toilet? No   TIMED UP AND GO:  Was the test performed? No . Phone visit   Cognitive  Function:Normal cognitive status assessed by  this Nurse Health Advisor. No abnormalities found.   MMSE - Mini Mental State Exam 08/20/2016  Orientation to time 5  Orientation to Place 5  Registration 3  Attention/ Calculation 5  Recall 3  Language- name 2 objects 2  Language- repeat 1  Language- follow 3 step command 3  Language- read & follow direction 1  Write a sentence 1  Copy design 1  Total score 30        Immunizations Immunization History  Administered Date(s) Administered   Fluad Quad(high Dose 65+) 11/03/2018   Influenza Whole 11/24/2010   Influenza, Seasonal, Injecte, Preservative Fre 12/28/2014   Influenza,inj,Quad PF,6+ Mos 11/17/2013   Influenza-Unspecified 11/17/2012, 11/29/2015, 11/14/2017   Moderna Sars-Covid-2 Vaccination 04/14/2019, 05/09/2019, 01/17/2020, 07/30/2020   Pneumococcal Conjugate-13 11/17/2013   Pneumococcal Polysaccharide-23 11/09/2011, 02/12/2017   Td 05/22/2019   Tdap 11/24/2010   Zoster Recombinat (Shingrix) 10/02/2017, 11/29/2017   Zoster, Live 03/17/2007    TDAP status: Up to date  Flu Vaccine status: Up to date  Pneumococcal vaccine status: Up to date  Covid-19 vaccine status: Completed vaccines  Qualifies for Shingles Vaccine? No   Zostavax completed Yes   Shingrix Completed?: Yes  Screening Tests Health Maintenance  Topic Date Due   FOOT EXAM  Never done   OPHTHALMOLOGY EXAM  Never done   MAMMOGRAM  11/23/2012   INFLUENZA VACCINE  10/14/2020   COLONOSCOPY (Pts 45-25yr Insurance coverage will need to be confirmed)  11/23/2020   COVID-19  Vaccine (5 - Booster for Moderna series) 11/30/2020   HEMOGLOBIN A1C  02/21/2021   TETANUS/TDAP  05/21/2029   DEXA SCAN  Completed   Hepatitis C Screening  Completed   PNA vac Low Risk Adult  Completed   Zoster Vaccines- Shingrix  Completed   HPV VACCINES  Aged Out    Health Maintenance  Health Maintenance Due  Topic Date Due   FOOT EXAM  Never done   OPHTHALMOLOGY EXAM   Never done   MAMMOGRAM  11/23/2012   INFLUENZA VACCINE  10/14/2020    Colorectal cancer screening: Declined  Mammogram status: Declined  Bone Density status: Completed 09/10/2020. Results reflect: Bone density results: NORMAL. Repeat every 2 years.  Lung Cancer Screening: (Low Dose CT Chest recommended if Age 64-80 years, 30 pack-year currently smoking OR have quit w/in 15years.) does qualify.   Lung Cancer Screening Referral: Declined  Additional Screening:  Hepatitis C Screening: Completed 01/16/2016  Vision Screening: Recommended annual ophthalmology exams for early detection of glaucoma and other disorders of the eye. Is the patient up to date with their annual eye exam?  No  Who is the provider or what is the name of the office in which the patient attends annual eye exams? unknown   Dental Screening: Recommended annual dental exams for proper oral hygiene  Community Resource Referral / Chronic Care Management: CRR required this visit?  No   CCM required this visit?  No      Plan:     I have personally reviewed and noted the following in the patient's chart:   Medical and social history Use of alcohol, tobacco or illicit drugs  Current medications and supplements including opioid prescriptions.  Functional ability and status Nutritional status Physical activity Advanced directives List of other physicians Hospitalizations, surgeries, and ER visits in previous 12 months Vitals Screenings to include cognitive, depression, and falls Referrals and appointments  In addition, I have reviewed and discussed with patient certain preventive protocols, quality metrics, and best practice recommendations. A written personalized care plan for preventive services as well as general preventive health recommendations were provided to patient.   Due to this being a telephonic visit, the after visit summary with patients personalized plan was offered to patient via mail or  my-chart. Patient would like to access on my-chart.  Marta Antu, LPN   [phone]  Nurse Health Advisor  Nurse Notes: Uvaldo Rising

## 2020-11-11 ENCOUNTER — Other Ambulatory Visit: Payer: Self-pay | Admitting: Family Medicine

## 2020-12-09 ENCOUNTER — Other Ambulatory Visit: Payer: Self-pay | Admitting: Family Medicine

## 2020-12-09 DIAGNOSIS — F32A Depression, unspecified: Secondary | ICD-10-CM

## 2020-12-24 ENCOUNTER — Other Ambulatory Visit (INDEPENDENT_AMBULATORY_CARE_PROVIDER_SITE_OTHER): Payer: Medicare Other

## 2020-12-24 ENCOUNTER — Encounter: Payer: Self-pay | Admitting: Family Medicine

## 2020-12-24 ENCOUNTER — Other Ambulatory Visit: Payer: Self-pay

## 2020-12-24 ENCOUNTER — Ambulatory Visit (INDEPENDENT_AMBULATORY_CARE_PROVIDER_SITE_OTHER): Payer: Medicare Other | Admitting: Family Medicine

## 2020-12-24 VITALS — BP 110/66 | HR 93 | Temp 98.9°F | Resp 16 | Wt 192.6 lb

## 2020-12-24 DIAGNOSIS — D509 Iron deficiency anemia, unspecified: Secondary | ICD-10-CM | POA: Diagnosis not present

## 2020-12-24 DIAGNOSIS — E782 Mixed hyperlipidemia: Secondary | ICD-10-CM

## 2020-12-24 DIAGNOSIS — E669 Obesity, unspecified: Secondary | ICD-10-CM | POA: Diagnosis not present

## 2020-12-24 DIAGNOSIS — E1169 Type 2 diabetes mellitus with other specified complication: Secondary | ICD-10-CM

## 2020-12-24 DIAGNOSIS — D649 Anemia, unspecified: Secondary | ICD-10-CM

## 2020-12-24 DIAGNOSIS — K219 Gastro-esophageal reflux disease without esophagitis: Secondary | ICD-10-CM

## 2020-12-24 DIAGNOSIS — R7989 Other specified abnormal findings of blood chemistry: Secondary | ICD-10-CM | POA: Diagnosis not present

## 2020-12-24 DIAGNOSIS — R000 Tachycardia, unspecified: Secondary | ICD-10-CM

## 2020-12-24 DIAGNOSIS — I10 Essential (primary) hypertension: Secondary | ICD-10-CM | POA: Diagnosis not present

## 2020-12-24 LAB — COMPREHENSIVE METABOLIC PANEL
ALT: 10 U/L (ref 0–35)
AST: 16 U/L (ref 0–37)
Albumin: 4.1 g/dL (ref 3.5–5.2)
Alkaline Phosphatase: 51 U/L (ref 39–117)
BUN: 17 mg/dL (ref 6–23)
CO2: 27 mEq/L (ref 19–32)
Calcium: 9.2 mg/dL (ref 8.4–10.5)
Chloride: 99 mEq/L (ref 96–112)
Creatinine, Ser: 0.56 mg/dL (ref 0.40–1.20)
GFR: 91.24 mL/min (ref >60.00)
Glucose, Bld: 150 mg/dL — ABNORMAL HIGH (ref 70–99)
Potassium: 4.4 mEq/L (ref 3.5–5.1)
Sodium: 136 mEq/L (ref 135–145)
Total Bilirubin: 0.3 mg/dL (ref 0.2–1.2)
Total Protein: 6.4 g/dL (ref 6.0–8.3)

## 2020-12-24 LAB — CBC WITH DIFFERENTIAL/PLATELET
Basophils Absolute: 0.1 10*3/uL (ref 0.0–0.1)
Basophils Relative: 1.1 % (ref 0.0–3.0)
Eosinophils Absolute: 0.1 10*3/uL (ref 0.0–0.7)
Eosinophils Relative: 1.3 % (ref 0.0–5.0)
HCT: 36.8 % (ref 36.0–46.0)
Hemoglobin: 11.1 g/dL — ABNORMAL LOW (ref 12.0–15.0)
Lymphocytes Relative: 23.5 % (ref 12.0–46.0)
Lymphs Abs: 1.8 10*3/uL (ref 0.7–4.0)
MCHC: 30.1 g/dL (ref 30.0–36.0)
MCV: 76.9 fl — ABNORMAL LOW (ref 78.0–100.0)
Monocytes Absolute: 0.6 10*3/uL (ref 0.1–1.0)
Monocytes Relative: 7.9 % (ref 3.0–12.0)
Neutro Abs: 5.1 10*3/uL (ref 1.4–7.7)
Neutrophils Relative %: 66.2 % (ref 43.0–77.0)
Platelets: 322 10*3/uL (ref 150.0–400.0)
RBC: 4.78 Mil/uL (ref 3.87–5.11)
RDW: 18.1 % — ABNORMAL HIGH (ref 11.5–15.5)
WBC: 7.7 10*3/uL (ref 4.0–10.5)

## 2020-12-24 LAB — IBC + FERRITIN
Ferritin: 3.7 ng/mL — ABNORMAL LOW (ref 10.0–291.0)
Iron: 24 ug/dL — ABNORMAL LOW (ref 42–145)
Saturation Ratios: 4.3 % — ABNORMAL LOW (ref 20.0–50.0)
TIBC: 555.8 ug/dL — ABNORMAL HIGH (ref 250.0–450.0)
Transferrin: 397 mg/dL — ABNORMAL HIGH (ref 212.0–360.0)

## 2020-12-24 LAB — LIPID PANEL
Cholesterol: 151 mg/dL (ref 0–200)
HDL: 62.9 mg/dL (ref >39.00)
LDL Cholesterol: 59 mg/dL (ref 0–99)
NonHDL: 88.19
Total CHOL/HDL Ratio: 2
Triglycerides: 146 mg/dL (ref 0.0–149.0)
VLDL: 29.2 mg/dL (ref 0.0–40.0)

## 2020-12-24 LAB — TSH: TSH: 1.03 u[IU]/mL (ref 0.35–5.50)

## 2020-12-24 LAB — HEMOGLOBIN A1C: Hgb A1c MFr Bld: 7.5 % — ABNORMAL HIGH (ref 4.6–6.5)

## 2020-12-24 LAB — VITAMIN D 25 HYDROXY (VIT D DEFICIENCY, FRACTURES): VITD: 72.31 ng/mL (ref 30.00–100.00)

## 2020-12-24 NOTE — Assessment & Plan Note (Signed)
Well controlled, no changes to meds. Encouraged heart healthy diet such as the DASH diet and exercise as tolerated.  °

## 2020-12-24 NOTE — Patient Instructions (Addendum)
Labs between Feb 6 and feb 10 at [place] no sooner than 5/11  Consider low-dose CT scan  Molnupiravir/Paxlovid is the new COVID medication we can give you if you get COVID so make sure you test if you have symptoms because we have to treat by day 5 of symptoms for it to be effective. If you are positive let us know so we can treat. If a home test is negative and your symptoms are persistent get a PCR test. Can check testing locations at [email or web address] If you are positive we will make an appointment with [place] and we will send in molnupiravir/Paxlovid if you would like it. Check with your pharmacy before we meet to confirm they have it in stock, if they do not then we can get the prescription at the [facility].    Achilles Tendinitis Achilles tendinitis is inflammation of the tough, cord-like band that attaches the lower leg muscles to the heel bone (Achilles tendon). This is usually caused by overusing the tendon and the ankle joint. Achilles tendinitis usually gets better over time with treatment and caring for yourself at home. It can take weeks or months to heal completely. What are the causes? This condition may be caused by: A sudden increase in exercise or activity, such as running. Doing the same exercises or activities, such as jumping, over and over. Not warming up calf muscles before exercising. Exercising in shoes that are worn out or not made for exercise. Having arthritis or a bone growth (spur) on the back of the heel bone. This can rub against the tendon and hurt it. Age-related wear and tear. Tendons become less flexible with age and are more likely to be injured. What are the signs or symptoms? Common symptoms of this condition include: Pain in the Achilles tendon or in the back of the leg, just above the heel. The pain usually gets worse with exercise. Stiffness or soreness in the back of the leg, especially in the morning. Swelling of the skin over the  Achilles tendon. Thickening of the tendon. Trouble standing on tiptoe. How is this diagnosed? This condition is diagnosed based on your symptoms and a physical exam. You may have tests, including: X-rays. MRI. How is this treated? The goal of treatment is to relieve symptoms and help your injury heal. Treatment may include: Decreasing or stopping activities that caused the tendinitis. This may mean switching to low-impact exercises like biking or swimming. Icing the injured area. Doing physical therapy, including strengthening and stretching exercises. Taking NSAIDs, such as ibuprofen, to help relieve pain and swelling. Using supportive shoes, wraps, heel lifts, or a walking boot (air cast). Having surgery. This may be done if your symptoms do not improve after other treatments. Using high-energy shock wave impulses to stimulate the healing process (extracorporeal shock wave therapy). This is rare. Having an injection of medicines that help relieve inflammation (corticosteroids). This is rare. Follow these instructions at home: If you have an air cast: Wear the air cast as told by your health care provider. Remove it only as told by your health care provider. Loosen it if your toes tingle, become numb, or turn cold and blue. Keep it clean. If the air cast is not waterproof: Do not let it get wet. Cover it with a watertight covering when you take a bath or shower. Managing pain, stiffness, and swelling  If directed, put ice on the injured area. To do this: If you have a removable air cast,  remove it as told by your health care provider. Put ice in a plastic bag. Place a towel between your skin and the bag. Leave the ice on for 20 minutes, 2-3 times a day. Move your toes often to reduce stiffness and swelling. Raise (elevate) your foot above the level of your heart while you are sitting or lying down. Activity Gradually return to your normal activities as told by your health care  provider. Ask your health care provider what activities are safe for you. Do not do activities that cause pain. Consider doing low-impact exercises, like cycling or swimming. Ask your health care provider when it is safe to drive if you have an air cast on your foot. If physical therapy was prescribed, do exercises as told by your health care provider or physical therapist. General instructions If directed, wrap your foot with an elastic bandage or other wrap. This can help to keep your tendon from moving too much while it heals. Your health care provider will show you how to wrap your foot correctly. Wear supportive shoes or heel lifts only as told by your health care provider. Take over-the-counter and prescription medicines only as told by your health care provider. Keep all follow-up visits as told by your health care provider. This is important. Contact a health care provider if you: Have symptoms that get worse. Have pain that does not get better with medicine. Develop new, unexplained symptoms. Develop warmth and swelling in your foot. Have a fever. Get help right away if you: Have a sudden popping sound or sensation in your Achilles tendon followed by severe pain. Cannot move your toes or foot. Cannot put any weight on your foot. Your foot or toes become numb and look white or blue even after loosening your bandage or air cast. Summary Achilles tendinitis is inflammation of the tough, cord-like band that attaches the lower leg muscles to the heel bone (Achilles tendon). This condition is usually caused by overusing the tendon and the ankle joint. It can also be caused by arthritis or normal aging. The most common symptoms of this condition include pain, swelling, or stiffness in the Achilles tendon or in the back of the leg. This condition is usually treated by decreasing or stopping activities that caused the tendinitis, icing the injured area, taking NSAIDs, and doing physical  therapy. This information is not intended to replace advice given to you by your health care provider. Make sure you discuss any questions you have with your health care provider. Document Revised: 07/18/2018 Document Reviewed: 07/18/2018 Elsevier Patient Education  [place].

## 2020-12-24 NOTE — Assessment & Plan Note (Signed)
hgba1c acceptable, minimize simple carbs. Increase exercise as tolerated. Continue current meds 

## 2020-12-24 NOTE — Assessment & Plan Note (Signed)
Mild, asymptomatic, with leukopenia. 

## 2020-12-24 NOTE — Assessment & Plan Note (Signed)
encouraged heart healthy diet, avoid trans fats, minimize simple carbs and saturated fats. Increase exercise as tolerated 

## 2020-12-24 NOTE — Assessment & Plan Note (Signed)
Avoid offending foods, start probiotics. Do not eat large meals in late evening and consider raising head of bed.  

## 2020-12-24 NOTE — Progress Notes (Signed)
Patient ID: Savannah Stanley, female    DOB: 1950-07-29  Age: 70 y.o. MRN: [id number]    Subjective:   No chief complaint on file.  Subjective   HPI Savannah Stanley presents for office visit today for follow up on HTN and type 2 diabetes. Her right akele heel having been causing her pain and trouble. She has tried Advair and Symbicort, but it did not improve her symptoms by a lot. She is interested in trying trelegy. She uses her albuterol when doing extraneous activities which happens 5-6 times a week. She takes Qunol BID, EZ melts vitamin C, and vitamin D 1000 IU supplements. Denies CP/palp/SOB/HA/congestion/fevers/GI or GU c/o. Taking meds as prescribed.   She quite smoking 10 years ago. She smoked for 30 years and had about 3/4 a pack daily.   Review of Systems  Constitutional:  Negative for chills, fatigue and fever.  HENT:  Negative for congestion, rhinorrhea, sinus pressure, sinus pain and sore throat.   Eyes:  Negative for pain.  Respiratory:  Negative for cough and shortness of breath.   Cardiovascular:  Negative for chest pain, palpitations and leg swelling.  Gastrointestinal:  Negative for abdominal pain, blood in stool, diarrhea, nausea and vomiting.  Genitourinary:  Negative for decreased urine volume, flank pain, frequency, vaginal bleeding and vaginal discharge.  Musculoskeletal:  Negative for back pain.  Neurological:  Negative for headaches.   History Past Medical History:  Diagnosis Date   Acute bronchitis with COPD ([place]) 03/19/2015   Bladder cancer ([place]) UROLOGIST-- DR Diona Fanti   HIGH-GRADE UROTHELIAL CARCINOMA   Bladder tumor "multiple"   "some ORs; some removed in office"   COPD (chronic obstructive pulmonary disease) ([place])    Depression    Diabetes mellitus type 2 in obese ([place]) 01/31/2015   GERD (gastroesophageal reflux disease)    H/O hiatal hernia    High cholesterol    History of blood transfusion 01/2013 - 02/2013   related to ORs   Hypertension     Iron deficiency anemia    Murmur, heart 05/20/2014   Pneumonia 07/2010; 08/07/2015   Type 2 diabetes mellitus ([place])     She has a past surgical history that includes Transurethral resection of bladder tumor with gyrus (turbt-gyrus) (N/A, 01/23/2013); Appendectomy (1980's); Tubal ligation (1980's); Transurethral resection of bladder tumor (N/A, 02/16/2013); Transurethral resection of bladder tumor (N/A, 12/18/2013); and Cystoscopy (N/A, 12/18/2013).   Her family history includes Arthritis in her brother; Birth defects in her son; Cancer in her father; Dementia in her brother; Frontotemporal dementia in her brother.She reports that she quit smoking about 10 years ago. Her smoking use included cigarettes. She has a 35.00 pack-year smoking history. She has never used smokeless tobacco. She reports current alcohol use. She reports that she does not use drugs.  Current Outpatient Medications on File Prior to Visit  Medication Sig Dispense Refill   albuterol (VENTOLIN HFA) 108 (90 Base) MCG/ACT inhaler INHALE 2 PUFFS BY MOUTH INTO THE LUNGS EVERY 6 HOURS AS NEEDED FOR WHEEZING AND SHORTNESS OF BREATH. GENERIC EQUIVALENT FOR PROAIR HFA 34 g 2   aspirin EC 81 MG tablet Take 1 tablet (81 mg total) by mouth daily. Swallow whole. 30 tablet 11   Calcium Citrate-Vitamin D (CITRACAL + D PO) Take 2 tablets by mouth 2 (two) times daily.      citalopram (CELEXA) 40 MG tablet Take 1 tablet by mouth once daily 30 tablet 0   Coenzyme Q10 (COQ10) 200 MG CAPS Take 1 capsule  by mouth daily.     EPINEPHrine 0.3 mg/0.3 mL IJ SOAJ injection Inject 0.3 mLs (0.3 mg total) into the muscle once. 1 Device 1   fluticasone-salmeterol (ADVAIR) 250-50 MCG/ACT AEPB Inhale 1 puff into the lungs in the morning and at bedtime. 60 each 3   guaiFENesin (MUCINEX) 600 MG 12 hr tablet Take 1 tablet (600 mg total) by mouth 2 (two) times daily. 20 tablet 0   losartan (COZAAR) 50 MG tablet TAKE 1 TABLET(50 MG TOTAL) BY MOUTH DAILY 90 tablet 1    MELATONIN PO Take 1 tablet by mouth at bedtime.     metFORMIN (GLUCOPHAGE) 500 MG tablet TAKE 2 TABLETS BY MOUTH IN THE MORNING AND 1 TABLET IN THE EVENING 270 tablet 1   metoprolol succinate (TOPROL-XL) 25 MG 24 hr tablet TAKE 1 TABLET(25 MG TOTAL) BY MOUTH DAILY 90 tablet 1   Multiple Vitamins-Minerals (ZINC PO) Take 1 tablet by mouth daily.     omeprazole (PRILOSEC) 20 MG capsule TAKE ONE CAPSULE BY MOUTH TWICE DAILY AS NEEDED 180 capsule 1   pravastatin (PRAVACHOL) 10 MG tablet TAKE 1 TABLET(10 MG TOTAL) BY MOUTH DAILY. 90 tablet 1   Probiotic Product (PROBIOTIC PO) Take 1 capsule by mouth daily.     Current Facility-Administered Medications on File Prior to Visit  Medication Dose Route Frequency Provider Last Rate Last Admin   mitoMYcin (MUTAMYCIN) chemo injection 40 mg  40 mg Bladder Instillation Once Franchot Gallo, MD         Objective:  Objective  Physical Exam Constitutional:      General: She is not in acute distress.    Appearance: Normal appearance. She is not ill-appearing or toxic-appearing.  HENT:     Head: Normocephalic and atraumatic.     Right Ear: Tympanic membrane, ear canal and external ear normal.     Left Ear: Tympanic membrane, ear canal and external ear normal.     Nose: No congestion or rhinorrhea.  Eyes:     Extraocular Movements: Extraocular movements intact.     Pupils: Pupils are equal, round, and reactive to light.  Cardiovascular:     Rate and Rhythm: Normal rate and regular rhythm.     Pulses: Normal pulses.     Heart sounds: Normal heart sounds. No murmur heard. Pulmonary:     Effort: Pulmonary effort is normal. No respiratory distress.     Breath sounds: Normal breath sounds. No wheezing, rhonchi or rales.  Abdominal:     General: Bowel sounds are normal.     Palpations: Abdomen is soft. There is no mass.     Tenderness: There is no abdominal tenderness. There is no guarding.     Hernia: No hernia is present.  Musculoskeletal:         General: Normal range of motion.     Cervical back: Normal range of motion and neck supple.  Skin:    General: Skin is warm and dry.  Neurological:     Mental Status: She is alert and oriented to person, place, and time.  Psychiatric:        Behavior: Behavior normal.   BP 110/66   Pulse 93   Temp 98.9 F (37.2 C)   Resp 16   Wt 192 lb 9.6 oz (87.4 kg)   SpO2 92%   BMI 34.12 kg/m  Wt Readings from Last 3 Encounters:  12/24/20 192 lb 9.6 oz (87.4 kg)  10/21/20 190 lb (86.2 kg)  08/22/20 190 lb 4.8  oz (86.3 kg)     Lab Results  Component Value Date   WBC 7.7 12/24/2020   HGB 11.1 (L) 12/24/2020   HCT 36.8 12/24/2020   PLT 322.0 12/24/2020   GLUCOSE 150 (H) 12/24/2020   CHOL 151 12/24/2020   TRIG 146.0 12/24/2020   HDL 62.90 12/24/2020   LDLCALC 59 12/24/2020   ALT 10 12/24/2020   AST 16 12/24/2020   NA 136 12/24/2020   K 4.4 12/24/2020   CL 99 12/24/2020   CREATININE 0.56 12/24/2020   BUN 17 12/24/2020   CO2 27 12/24/2020   TSH 1.03 12/24/2020   INR 1.04 01/18/2013   HGBA1C 7.5 (H) 12/24/2020   MICROALBUR <0.7 07/15/2015    DG Bone Density  Result Date: 09/10/2020 EXAM: DUAL X-RAY ABSORPTIOMETRY (DXA) FOR BONE MINERAL DENSITY IMPRESSION: Yuliya Nova A Mahamadou Weltz Your patient Luv Mish completed a BMD test on 09/10/2020 using the Burt (analysis version: 16.SP2) manufactured by [employer]. The following summarizes the results of our evaluation. [facility] PATIENT: Name: Cookie, Pore Patient ID: [id number] Birth Date: 08-30-1950 Height: 63.0 in. Gender: Female Measured: 09/10/2020 Weight: 190.3 lbs. Indications: Advanced Age, Caucasian, Diabetic, Estrogen Deficiency, Post Menopausal Fractures: Treatments: Albuterol, Omizeperozole ASSESSMENT: The BMD measured at Femur Neck Right is 0.900 g/cm2 with a T-score of -1.0. This patient is considered normal according to [facility]) criteria. The scan quality is good. Lumbar Spine is excluded due to  degenerative changes. Site Region Measured Date Measured Age WHO YA BMD Classification T-score DualFemur Total Mean 09/10/2020 72.0 years Normal -0.3 0.965 g/cm2 Right Forearm Radius 33% 09/10/2020 72.0 Normal -0.4 0.839 g/cm2 World Health Organization [facility]) criteria for post-menopausal, Caucasian Women: Normal       T-score at or above -1 SD Osteopenia   T-score between -1 and -2.5 SD Osteoporosis T-score at or below -2.5 SD RECOMMENDATION: 1. All patients should optimize calcium and vitamin D intake. 2. Consider FDA-approved medical therapies in postmenopausal women and men aged 75 years and older, based on the following: a. A hip or vertebral(clinical or morphometric) fracture. b. T-Score < -2.5 at the femoral neck or spine after appropriate evaluation to exclude secondary causes c. Low bone mass (T-score between -1.0 and -2.5 at the femoral neck or spine) and a 10 year probability of a hip fracture >3% or a 10 year probability of major osteoporosis-related fracture > 20% based on the [place]-adapted WHO algorithm d. Clinical judgement and/or patient preferences may indicate treatment for people with 10-year fracture probabilities above or below these levels FOLLOW-UP: Patients with diagnosis of osteoporosis or at high risk for fracture should have regular bone mineral density tests. For patients eligible for Medicare, routine testing is allowed once every 2 years. The testing frequency can be increased to one year for patients who have rapidly progressing disease, those who are receiving or discontinuing medical therapy to restore bone mass, or have additional risk factors. I have reviewed this report and agree with the above findings. [facility]iology Electronically Signed   By: Lowella Grip III M.D.   On: 09/10/2020 11:49     Assessment & Plan:  Plan    No orders of the defined types were placed in this encounter.   Problem List Items Addressed This Visit     Anemia   Relevant Orders   CBC  with Differential/Platelet (Completed)   Comprehensive metabolic panel (Completed)   TSH (Completed)   Lipid panel (Completed)   Hypertension    Well  controlled, no changes to meds. Encouraged heart healthy diet such as the DASH diet and exercise as tolerated.       Relevant Orders   CBC with Differential/Platelet (Completed)   Comprehensive metabolic panel (Completed)   TSH (Completed)   Lipid panel (Completed)   CBC   Comprehensive metabolic panel   TSH   Hyperlipidemia    encouraged heart healthy diet, avoid trans fats, minimize simple carbs and saturated fats. Increase exercise as tolerated      Relevant Orders   CBC with Differential/Platelet (Completed)   Comprehensive metabolic panel (Completed)   TSH (Completed)   Lipid panel (Completed)   Lipid panel   Esophageal reflux    Avoid offending foods, start probiotics. Do not eat large meals in late evening and consider raising head of bed.       Tachycardia    Mild, asymptomatic      Diabetes mellitus type 2 in obese (HCC) - Primary    hgba1c acceptable, minimize simple carbs. Increase exercise as tolerated. Continue current meds      Relevant Orders   Hemoglobin A1c (Completed)   Hemoglobin A1c   Low vitamin D level    Supplement and monitor      Relevant Orders   VITAMIN D 25 Hydroxy (Vit-D Deficiency, Fractures) (Completed)    Follow-up: Return in about 4 months (around 04/26/2021), or 4 mn f/u and 7-8 mn CPE, for 3-4 months f/u, then 3-4 months for annual CPE.  I, Suezanne Jacquet, acting as a scribe for Penni Homans, MD, have documented all relevent documentation on behalf of Penni Homans, MD, as directed by Penni Homans, MD while in the presence of Penni Homans, MD. DO:12/25/20.  I, [name], MD personally performed the services described in this documentation. All medical record entries made by the scribe were at my direction and in my presence. I have reviewed the chart and agree that the record  reflects my personal performance and is accurate and complete

## 2020-12-25 ENCOUNTER — Other Ambulatory Visit: Payer: Self-pay | Admitting: Family Medicine

## 2020-12-25 ENCOUNTER — Other Ambulatory Visit: Payer: Self-pay

## 2020-12-25 DIAGNOSIS — R7989 Other specified abnormal findings of blood chemistry: Secondary | ICD-10-CM | POA: Insufficient documentation

## 2020-12-25 DIAGNOSIS — D649 Anemia, unspecified: Secondary | ICD-10-CM

## 2020-12-25 MED ORDER — HEMOCYTE-F 324-1 MG PO TABS: 1.0000 | ORAL_TABLET | Freq: Every day | ORAL | 3 refills | Status: DC

## 2020-12-25 NOTE — Addendum Note (Signed)
Addended by: Randolm Idol A on: 12/25/2020 11:39 AM   Modules accepted: Orders

## 2020-12-25 NOTE — Addendum Note (Signed)
Addended by: Randolm Idol A on: 12/25/2020 11:33 AM   Modules accepted: Orders

## 2020-12-25 NOTE — Assessment & Plan Note (Signed)
Supplement and monitor 

## 2020-12-28 ENCOUNTER — Other Ambulatory Visit: Payer: Self-pay | Admitting: Family Medicine

## 2020-12-28 DIAGNOSIS — D649 Anemia, unspecified: Secondary | ICD-10-CM

## 2020-12-29 ENCOUNTER — Encounter: Payer: Self-pay | Admitting: Family Medicine

## 2020-12-29 DIAGNOSIS — D649 Anemia, unspecified: Secondary | ICD-10-CM

## 2020-12-30 ENCOUNTER — Encounter: Payer: Self-pay | Admitting: Family Medicine

## 2020-12-30 MED ORDER — HEMOCYTE-F 324-1 MG PO TABS: 1.0000 | ORAL_TABLET | Freq: Every day | ORAL | 3 refills | Status: DC

## 2021-01-11 ENCOUNTER — Other Ambulatory Visit: Payer: Self-pay | Admitting: Family Medicine

## 2021-01-11 DIAGNOSIS — F32A Depression, unspecified: Secondary | ICD-10-CM

## 2021-01-20 ENCOUNTER — Encounter: Payer: Self-pay | Admitting: Family Medicine

## 2021-02-19 ENCOUNTER — Telehealth (INDEPENDENT_AMBULATORY_CARE_PROVIDER_SITE_OTHER): Payer: Medicare Other | Admitting: Medical

## 2021-02-19 DIAGNOSIS — J40 Bronchitis, not specified as acute or chronic: Secondary | ICD-10-CM | POA: Diagnosis not present

## 2021-02-19 MED ORDER — FLUTICASONE PROPIONATE 50 MCG/ACT NA SUSP: 2.0000 | Freq: Every day | NASAL | 1 refills | Status: DC

## 2021-02-19 MED ORDER — ALBUTEROL SULFATE HFA 108 (90 BASE) MCG/ACT IN AERS: 2.0000 | INHALATION_SPRAY | Freq: Four times a day (QID) | RESPIRATORY_TRACT | 0 refills | Status: DC | PRN

## 2021-02-19 MED ORDER — AZITHROMYCIN 250 MG PO TABS: ORAL_TABLET | ORAL | 0 refills | Status: AC

## 2021-02-19 MED ORDER — BENZONATATE 100 MG PO CAPS: 100.0000 mg | ORAL_CAPSULE | Freq: Three times a day (TID) | ORAL | 0 refills | Status: DC | PRN

## 2021-02-19 NOTE — Patient Instructions (Addendum)
You appear to have bronchitis. Rest hydrate and tylenol for fever. I am prescribing cough medicine benzonatate, and azithromycin antibiotic. For your nasal congestion rx flonase.  If you get wheezing making albuterol available.  You should gradually get better. If not then notify us and would recommend a chest xray.  Recommend test for covid one more time tomorrow. If + let me know asap and can offer antiviral medication.  Follow up in 7-10 days or as needed

## 2021-02-19 NOTE — Progress Notes (Signed)
   Subjective:    Patient ID: Savannah Stanley, female    DOB: 07-Apr-1950, 70 y.o.   MRN: [id number]  HPI Virtual Visit via Video Note  I connected with Savannah Stanley on 02/19/21 at  2:20 PM EST by a video enabled telemedicine application and verified that I am speaking with the correct person using two identifiers.  Location: Patient: home Provider: home   I discussed the limitations of evaluation and management by telemedicine and the availability of in person appointments. The patient expressed understanding and agreed to proceed.  History of Present Illness: Pt states Tuesday after thanksgiving she got nasal congestion. Pt states since then has gotten chest congestion and productive cough at times. But no sinus pressure.  Pt has no fever, no chills, no sweats, no body aches and no dyspnea.   No wheeze except for rare transient during cough episodes.  Non smoker. She quit 12 years.smoked for 30 year before quitting.  Negative covid test this morning.  Get occasional bronchitis.     Observations/Objective:  General-no acute distress, pleasant, oriented. Lungs- on inspection lungs appear unlabored. Neck- no tracheal deviation or jvd on inspection. Neuro- gross motor function appears intact.   Assessment and Plan:  Patient Instructions  You appear to have bronchitis. Rest hydrate and tylenol for fever. I am prescribing cough medicine benzonatate, and azithromycin antibiotic. For your nasal congestion rx flonase.  If you get wheezing making albuterol available.  You should gradually get better. If not then notify us and would recommend a chest xray.   Follow up in 7-10 days or as needed     Mackie Pai, PA-C    Follow Up Instructions:    I discussed the assessment and treatment plan with the patient. The patient was provided an opportunity to ask questions and all were answered. The patient agreed with the plan and demonstrated an understanding of the  instructions.   The patient was advised to call back or seek an in-person evaluation if the symptoms worsen or if the condition fails to improve as anticipated.   Time spent with patient today was 15  minutes which consisted of chart revdiew, discussing diagnosis, work up treatment and documentation.    Mackie Pai, PA-C    Review of Systems     Objective:   Physical Exam        Assessment & Plan:

## 2021-03-27 ENCOUNTER — Other Ambulatory Visit: Payer: Medicare Other

## 2021-04-24 ENCOUNTER — Other Ambulatory Visit (INDEPENDENT_AMBULATORY_CARE_PROVIDER_SITE_OTHER): Payer: Medicare Other

## 2021-04-24 DIAGNOSIS — E782 Mixed hyperlipidemia: Secondary | ICD-10-CM

## 2021-04-24 DIAGNOSIS — E669 Obesity, unspecified: Secondary | ICD-10-CM | POA: Diagnosis not present

## 2021-04-24 DIAGNOSIS — I10 Essential (primary) hypertension: Secondary | ICD-10-CM

## 2021-04-24 DIAGNOSIS — D649 Anemia, unspecified: Secondary | ICD-10-CM | POA: Diagnosis not present

## 2021-04-24 DIAGNOSIS — E1169 Type 2 diabetes mellitus with other specified complication: Secondary | ICD-10-CM

## 2021-04-24 LAB — CBC WITH DIFFERENTIAL/PLATELET
Basophils Absolute: 0 10*3/uL (ref 0.0–0.1)
Basophils Relative: 0.7 % (ref 0.0–3.0)
Eosinophils Absolute: 0.1 10*3/uL (ref 0.0–0.7)
Eosinophils Relative: 1.2 % (ref 0.0–5.0)
HCT: 42.9 % (ref 36.0–46.0)
Hemoglobin: 13.6 g/dL (ref 12.0–15.0)
Lymphocytes Relative: 24.9 % (ref 12.0–46.0)
Lymphs Abs: 1.8 10*3/uL (ref 0.7–4.0)
MCHC: 31.6 g/dL (ref 30.0–36.0)
MCV: 88.5 fl (ref 78.0–100.0)
Monocytes Absolute: 0.6 10*3/uL (ref 0.1–1.0)
Monocytes Relative: 8.3 % (ref 3.0–12.0)
Neutro Abs: 4.8 10*3/uL (ref 1.4–7.7)
Neutrophils Relative %: 64.9 % (ref 43.0–77.0)
Platelets: 222 10*3/uL (ref 150.0–400.0)
RBC: 4.84 Mil/uL (ref 3.87–5.11)
RDW: 17.9 % — ABNORMAL HIGH (ref 11.5–15.5)
WBC: 7.4 10*3/uL (ref 4.0–10.5)

## 2021-04-24 LAB — COMPREHENSIVE METABOLIC PANEL
ALT: 10 U/L (ref 0–35)
AST: 13 U/L (ref 0–37)
Albumin: 4.2 g/dL (ref 3.5–5.2)
Alkaline Phosphatase: 51 U/L (ref 39–117)
BUN: 13 mg/dL (ref 6–23)
CO2: 31 mEq/L (ref 19–32)
Calcium: 9.3 mg/dL (ref 8.4–10.5)
Chloride: 98 mEq/L (ref 96–112)
Creatinine, Ser: 0.56 mg/dL (ref 0.40–1.20)
GFR: 91.03 mL/min (ref >60.00)
Glucose, Bld: 182 mg/dL — ABNORMAL HIGH (ref 70–99)
Potassium: 4.2 mEq/L (ref 3.5–5.1)
Sodium: 137 mEq/L (ref 135–145)
Total Bilirubin: 0.4 mg/dL (ref 0.2–1.2)
Total Protein: 6.5 g/dL (ref 6.0–8.3)

## 2021-04-24 LAB — HEMOGLOBIN A1C: Hgb A1c MFr Bld: 7.3 % — ABNORMAL HIGH (ref 4.6–6.5)

## 2021-04-24 LAB — LIPID PANEL
Cholesterol: 162 mg/dL (ref 0–200)
HDL: 64.4 mg/dL (ref >39.00)
LDL Cholesterol: 75 mg/dL (ref 0–99)
NonHDL: 97.17
Total CHOL/HDL Ratio: 3
Triglycerides: 110 mg/dL (ref 0.0–149.0)
VLDL: 22 mg/dL (ref 0.0–40.0)

## 2021-04-24 LAB — IRON,TIBC AND FERRITIN PANEL
%SAT: 21 % (calc) (ref 16–45)
Ferritin: 8 ng/mL — ABNORMAL LOW (ref 16–288)
Iron: 93 ug/dL (ref 45–160)
TIBC: 434 mcg/dL (calc) (ref 250–450)

## 2021-04-24 LAB — TSH: TSH: 1.04 u[IU]/mL (ref 0.35–5.50)

## 2021-05-01 ENCOUNTER — Telehealth (INDEPENDENT_AMBULATORY_CARE_PROVIDER_SITE_OTHER): Payer: Medicare Other | Admitting: Family Medicine

## 2021-05-01 ENCOUNTER — Ambulatory Visit: Payer: Medicare Other | Admitting: Family Medicine

## 2021-05-01 ENCOUNTER — Encounter: Payer: Self-pay | Admitting: Family Medicine

## 2021-05-01 VITALS — BP 120/79 | HR 74

## 2021-05-01 DIAGNOSIS — E1169 Type 2 diabetes mellitus with other specified complication: Secondary | ICD-10-CM | POA: Diagnosis not present

## 2021-05-01 DIAGNOSIS — K219 Gastro-esophageal reflux disease without esophagitis: Secondary | ICD-10-CM

## 2021-05-01 DIAGNOSIS — J441 Chronic obstructive pulmonary disease with (acute) exacerbation: Secondary | ICD-10-CM

## 2021-05-01 DIAGNOSIS — E669 Obesity, unspecified: Secondary | ICD-10-CM

## 2021-05-01 DIAGNOSIS — I10 Essential (primary) hypertension: Secondary | ICD-10-CM

## 2021-05-01 DIAGNOSIS — E782 Mixed hyperlipidemia: Secondary | ICD-10-CM | POA: Diagnosis not present

## 2021-05-01 MED ORDER — TRELEGY ELLIPTA 100-62.5-25 MCG/ACT IN AEPB: 1.0000 | INHALATION_SPRAY | Freq: Every day | RESPIRATORY_TRACT | 11 refills | Status: DC

## 2021-05-01 NOTE — Progress Notes (Signed)
Virtual Video Visit via MyChart Note converted to telephone call   I connected with  Savannah Stanley on 05/01/21 at 11:00 AM EST by the video enabled telemedicine application for MyChart, and verified that I am speaking with the correct person using two identifiers.   I introduced myself as a Designer, jewellery with the practice. We discussed the limitations of evaluation and management by telemedicine and the availability of in person appointments. The patient expressed understanding and agreed to proceed.  Participating parties in this visit include: The patient and the nurse practitioner listed.  The patient is: At home I am: at home   Subjective:    CC: 46-month follow-up  HPI: Savannah Stanley is a 71 y.o. year old female presenting today via [place] today for 92-month follow-up.   HYPERTENSION: - Medications: losartan 50 mg daily, metoprolol 25 mg daily - Compliance: good - Checking BP at home: yes, usually [id number] systolic - Denies any SOB, recurrent headaches, CP, vision changes, LE edema, dizziness, palpitations, or medication side effects. - Diet: low sodium, low carb - Exercise: walking daily    HYPERLIPIDEMIA - medications: pravastatin 10 mg daily  - compliance: good  - medication SEs: no The 10-year ASCVD risk score (Arnett DK, et al., 2019) is: 23.6%   Values used to calculate the score:     Age: 11 years     Sex: Female     Is Non-Hispanic African American: No     Diabetic: Yes     Tobacco smoker: No     Systolic Blood Pressure: 202 mmHg     Is BP treated: Yes     HDL Cholesterol: 64.4 mg/dL     Total Cholesterol: 162 mg/dL  GERD At last visit with PCP she was instructed to avoid trigger foods, large meals in late evening, start a probiotic, and consider raising head of bed. She reports doing much better now. Taking Prilosec daily.     DIABETES - Checking BG at home: no - Medications: metformin 1000 mg every morning and 500 mg nightly - Compliance:  good - Diet: low sodium, low carb - Exercise: walking, ADLs - Eye exam: planning to schedule soon  - Denies symptoms of hypoglycemia, polyuria, polydipsia, numbness extremities, foot ulcers/trauma, wounds that are not healing, medication side effects    COPD COPD status: stable Current treatments: Advair helping but not quite enough, has tried Brio and Symbicort in the past  Satisfied with current treatment?: no Oxygen use: no Dyspnea frequency: intermittent  Cough frequency: average, allergy post nasal drip Rescue inhaler frequency:  at least once per day  Limitation of activity: not usually  Productive cough: yes          Past medical history, Surgical history, Family history not pertinant except as noted below, Social history, Allergies, and medications have been entered into the medical record, reviewed, and corrections made.   Review of Systems:  All review of systems negative except what is listed in the HPI   Objective:    General:  Speaking clearly in complete sentences. Absent shortness of breath noted.   Alert and oriented x3.   Normal judgment.  Absent acute distress.   Impression and Recommendations:    COPD (chronic obstructive pulmonary disease) ([place]) Does not feel adequately controlled on Advair, still having to use rescue inhaler at least once per day. Will try sending in Trelegy instead.   Hypertension Blood pressure is at goal for age and co-morbidities.  I recommend continue losartan  50 mg daily and metoprolol 25 mg daily.  In addition they were instructed on the following: - BP goal <130/80 - monitor and log blood pressures at home - check around the same time each day in a relaxed setting - Limit salt to <2000 mg/day - Follow DASH eating plan (heart healthy diet) - limit alcohol to 2 standard drinks per day for men and 1 per day for women - avoid tobacco products - get at least 2 hours of regular aerobic exercise weekly Patient aware of  signs/symptoms requiring further/urgent evaluation. Labs updated prior to visit - no changes   Esophageal reflux Much improved. No changes to plan. Continue lifestyle modifications.   Diabetes mellitus type 2 in obese (HCC) Well controlled with last A1c 7.3% (last week) Continue current medications: metformin 1000 mg in am and 500 mg in pm UTD on vaccines, foot exam; planning to schedule eye exam soon On ACEi/ARB On Statin Discussed diet and exercise F/u in 3-4 months   Hyperlipidemia -Reviewed most recent lipid panel (last week) -Medication management: continue pravastatin 10 mg daily -CMP and lipid panel done last week, no changes -Diet low in saturated fat -Regular exercise - at least 30 minutes, 5 times per week     Follow-up if symptoms worsen or fail to improve.    I discussed the assessment and treatment plan with the patient. The patient was provided an opportunity to ask questions and all were answered. The patient agreed with the plan and demonstrated an understanding of the instructions.   The patient was advised to call back or seek an in-person evaluation if the symptoms worsen or if the condition fails to improve as anticipated.  I spent 20 minutes dedicated to the care of this patient on the date of this encounter to include pre-visit chart review of prior notes and results, face-to-face time with the patient, and post-visit ordering of testing as indicated.   Terrilyn Saver, NP

## 2021-05-01 NOTE — Assessment & Plan Note (Signed)
-  Reviewed most recent lipid panel (last week) -Medication management: continue pravastatin 10 mg daily -CMP and lipid panel done last week, no changes -Diet low in saturated fat -Regular exercise - at least 30 minutes, 5 times per week

## 2021-05-01 NOTE — Assessment & Plan Note (Signed)
Blood pressure is at goal for age and co-morbidities.  I recommend continue losartan 50 mg daily and metoprolol 25 mg daily.  In addition they were instructed on the following: - BP goal <130/80 - monitor and log blood pressures at home - check around the same time each day in a relaxed setting - Limit salt to <2000 mg/day - Follow DASH eating plan (heart healthy diet) - limit alcohol to 2 standard drinks per day for men and 1 per day for women - avoid tobacco products - get at least 2 hours of regular aerobic exercise weekly Patient aware of signs/symptoms requiring further/urgent evaluation. Labs updated prior to visit - no changes

## 2021-05-01 NOTE — Assessment & Plan Note (Signed)
Does not feel adequately controlled on Advair, still having to use rescue inhaler at least once per day. Will try sending in Trelegy instead.

## 2021-05-01 NOTE — Assessment & Plan Note (Signed)
Well controlled with last A1c 7.3% (last week) Continue current medications: metformin 1000 mg in am and 500 mg in pm UTD on vaccines, foot exam; planning to schedule eye exam soon On ACEi/ARB On Statin Discussed diet and exercise F/u in 3-4 months

## 2021-05-01 NOTE — Assessment & Plan Note (Signed)
Much improved. No changes to plan. Continue lifestyle modifications.

## 2021-05-12 ENCOUNTER — Telehealth: Payer: Self-pay | Admitting: Family Medicine

## 2021-05-12 ENCOUNTER — Other Ambulatory Visit: Payer: Self-pay

## 2021-05-12 DIAGNOSIS — E669 Obesity, unspecified: Secondary | ICD-10-CM

## 2021-05-12 DIAGNOSIS — I10 Essential (primary) hypertension: Secondary | ICD-10-CM

## 2021-05-12 DIAGNOSIS — E1169 Type 2 diabetes mellitus with other specified complication: Secondary | ICD-10-CM

## 2021-05-12 DIAGNOSIS — J441 Chronic obstructive pulmonary disease with (acute) exacerbation: Secondary | ICD-10-CM

## 2021-05-12 MED ORDER — METOPROLOL SUCCINATE ER 25 MG PO TB24: ORAL_TABLET | ORAL | 1 refills | Status: DC

## 2021-05-12 MED ORDER — OMEPRAZOLE 20 MG PO CPDR: DELAYED_RELEASE_CAPSULE | ORAL | 1 refills | Status: DC

## 2021-05-12 MED ORDER — METFORMIN HCL 500 MG PO TABS: 1000.0000 mg | ORAL_TABLET | Freq: Every day | ORAL | 1 refills | Status: DC

## 2021-05-12 MED ORDER — TRELEGY ELLIPTA 100-62.5-25 MCG/ACT IN AEPB: 1.0000 | INHALATION_SPRAY | Freq: Every day | RESPIRATORY_TRACT | 11 refills | Status: DC

## 2021-05-12 MED ORDER — PRAVASTATIN SODIUM 10 MG PO TABS: ORAL_TABLET | ORAL | 1 refills | Status: DC

## 2021-05-12 MED ORDER — LOSARTAN POTASSIUM 50 MG PO TABS: ORAL_TABLET | ORAL | 1 refills | Status: DC

## 2021-05-12 NOTE — Telephone Encounter (Signed)
Medication:  metoprolol succinate (TOPROL-XL) 25 MG 24 hr tablet [[id number]]  losartan (COZAAR) 50 MG tablet [[id number]]  metFORMIN (GLUCOPHAGE) 500 MG tablet [[id number]]  metoprolol succinate (TOPROL-XL) 25 MG 24 hr tablet [[id number]]   Has the patient contacted their pharmacy? Yes.   (If no, request that the patient contact the pharmacy for the refill.) (If yes, when and what did the pharmacy advise?)     Preferred Pharmacy (with phone number or street name):  [facility] [place], [place] [address]  Phone:  [phone]  Fax:  [phone]     Agent: Please be advised that RX refills may take up to 3 business days. We ask that you follow-up with your pharmacy.

## 2021-07-28 ENCOUNTER — Encounter: Payer: Medicare Other | Admitting: Family Medicine

## 2021-08-12 ENCOUNTER — Other Ambulatory Visit: Payer: Self-pay | Admitting: Family Medicine

## 2021-08-12 DIAGNOSIS — F32A Depression, unspecified: Secondary | ICD-10-CM

## 2021-09-19 DIAGNOSIS — Z8551 Personal history of malignant neoplasm of bladder: Secondary | ICD-10-CM | POA: Diagnosis not present

## 2021-10-13 ENCOUNTER — Ambulatory Visit (INDEPENDENT_AMBULATORY_CARE_PROVIDER_SITE_OTHER): Payer: Medicare Other | Admitting: Family

## 2021-10-13 ENCOUNTER — Telehealth: Payer: Self-pay | Admitting: Family Medicine

## 2021-10-13 ENCOUNTER — Other Ambulatory Visit: Payer: Self-pay

## 2021-10-13 VITALS — BP 129/54 | HR 91 | Temp 99.0°F | Resp 16 | Wt 196.0 lb

## 2021-10-13 DIAGNOSIS — S40862A Insect bite (nonvenomous) of left upper arm, initial encounter: Secondary | ICD-10-CM | POA: Diagnosis not present

## 2021-10-13 DIAGNOSIS — S50861A Insect bite (nonvenomous) of right forearm, initial encounter: Secondary | ICD-10-CM

## 2021-10-13 DIAGNOSIS — E1169 Type 2 diabetes mellitus with other specified complication: Secondary | ICD-10-CM

## 2021-10-13 DIAGNOSIS — W57XXXA Bitten or stung by nonvenomous insect and other nonvenomous arthropods, initial encounter: Secondary | ICD-10-CM | POA: Diagnosis not present

## 2021-10-13 MED ORDER — METFORMIN HCL 500 MG PO TABS: 1000.0000 mg | ORAL_TABLET | Freq: Every day | ORAL | 1 refills | Status: DC

## 2021-10-13 MED ORDER — PREDNISONE 20 MG PO TABS: 20.0000 mg | ORAL_TABLET | Freq: Every day | ORAL | 0 refills | Status: DC

## 2021-10-13 MED ORDER — BETAMETHASONE VALERATE 0.1 % EX OINT: 1.0000 | TOPICAL_OINTMENT | Freq: Two times a day (BID) | CUTANEOUS | 0 refills | Status: DC

## 2021-10-13 NOTE — Progress Notes (Signed)
Subjective:   By signing my name below, I, Savannah Stanley, attest that this documentation has been prepared under the direction and in the presence of Savannah Alar, NP 10/13/2021     Patient ID: Savannah Stanley, female    DOB: 1950/06/19, 71 y.o.   MRN: [id number]  Chief Complaint  Patient presents with   Insect Bite    Patient complains of "insect bites on arms"    HPI Patient is in today for a office visit.   Insect bite- She complains of a itching bug bite on her right forearm and behind her left upper arm. She found after waking up this morning her left forearm was covered in bruises. She thinks she may have scratched the area in her sleep. She is applying cortisone cream and taking allegra to manage her symptoms with minimal relief. She sat on her screen porch, but does not recall being bitten or stung.      Health Maintenance Due  Topic Date Due   OPHTHALMOLOGY EXAM  Never done   MAMMOGRAM  11/23/2012   COLONOSCOPY (Pts 45-31yr Insurance coverage will need to be confirmed)  11/23/2020   COVID-19 Vaccine (5 - Booster for Moderna series) 02/12/2021    Past Medical History:  Diagnosis Date   Acute bronchitis with COPD ([facility] 03/19/2015   [facility] DR DDiona Fanti  HIGH-GRADE UROTHELIAL CARCINOMA   Bladder tumor "multiple"   "some ORs; some removed in office"   COPD (chronic obstructive pulmonary disease) ([facility]    Depression    Diabetes mellitus type 2 in obese ([facility] 01/31/2015   GERD (gastroesophageal reflux disease)    H/O hiatal hernia    High cholesterol    History of blood transfusion 01/2013 - 02/2013   related to ORs   Hypertension    Iron deficiency anemia    Murmur, heart 05/20/2014   Pneumonia 07/2010; 08/07/2015   Type 2 diabetes mellitus ([facility]     Past Surgical History:  Procedure Laterality Date   APPENDECTOMY  1980's   CYSTOSCOPY N/A 12/18/2013   Procedure: CYSTOSCOPY;  Surgeon: SJorja Loa MD;  [facility]  Service: Urology;  Laterality: N/A;   TRANSURETHRAL RESECTION OF BLADDER TUMOR N/A 02/16/2013   Procedure: TRANSURETHRAL RESECTION OF BLADDER TUMOR (TURBT);  Surgeon: SFranchot Gallo MD;  [facility]  Service: Urology;  Laterality: N/A;   TRANSURETHRAL RESECTION OF BLADDER TUMOR N/A 12/18/2013   Procedure: TRANSURETHRAL RESECTION OF BLADDER TUMOR (TURBT) ;  Surgeon: SJorja Loa MD;  [facility]  Service: Urology;  Laterality: N/A;   TRANSURETHRAL RESECTION OF BLADDER TUMOR WITH GYRUS (TURBT-GYRUS) N/A 01/23/2013   Procedure: TRANSURETHRAL RESECTION OF BLADDER TUMOR WITH GYRUS ;  Surgeon: SFranchot Gallo MD;  [facility];  Service: Urology;  Laterality: N/A;   TUBAL LIGATION  1980's    Family History  Problem Relation Age of Onset   Cancer Father        prostate   Arthritis Brother    Frontotemporal dementia Brother    Dementia Brother    Birth defects Son     Social History   Socioeconomic History   Marital status: Widowed    Spouse name: Not on file   Number of children: Not on file   Years of education: Not on file   Highest education level: Not on file  Occupational History   Not on file  Tobacco Use   Smoking status:  Former    Packs/day: 1.00    Years: 35.00    Total pack years: 35.00    Types: Cigarettes    Quit date: 07/15/2010    Years since quitting: 11.2   Smokeless tobacco: Never  Substance and Sexual Activity   Alcohol use: Yes    Comment: wine socially    Drug use: No   Sexual activity: Not on file  Other Topics Concern   Not on file  Social History Narrative   Not on file   Social Determinants of Health   Financial Resource Strain: Low Risk  (10/21/2020)   Overall Financial Resource Strain (CARDIA)    Difficulty of Paying Living Expenses: Not hard at all  Food Insecurity: No Food Insecurity (10/21/2020)   Hunger Vital Sign    Worried About Running Out of Food in the  Last Year: Never true    [place] in the Last Year: Never true  Transportation Needs: No Transportation Needs (10/21/2020)   PRAPARE - Hydrologist (Medical): No    Lack of Transportation (Non-Medical): No  Physical Activity: Inactive (10/21/2020)   Exercise Vital Sign    Days of Exercise per Week: 0 days    Minutes of Exercise per Session: 0 min  Stress: No Stress Concern Present (10/21/2020)   [place]    Feeling of Stress : Only a little  Social Connections: Moderately Isolated (10/21/2020)   Social Connection and Isolation Panel [NHANES]    Frequency of Communication with Friends and Family: More than three times a week    Frequency of Social Gatherings with Friends and Family: More than three times a week    Attends Religious Services: More than 4 times per year    Active Member of [employer] or Organizations: No    Attends Archivist Meetings: Never    Marital Status: Widowed  Intimate Partner Violence: Not At Risk (10/21/2020)   Humiliation, Afraid, Rape, and Kick questionnaire    Fear of Current or Ex-Partner: No    Emotionally Abused: No    Physically Abused: No    Sexually Abused: No    Outpatient Medications Prior to Visit  Medication Sig Dispense Refill   albuterol (VENTOLIN HFA) 108 (90 Base) MCG/ACT inhaler INHALE 2 PUFFS BY MOUTH INTO THE LUNGS EVERY 6 HOURS AS NEEDED FOR WHEEZING AND SHORTNESS OF BREATH. GENERIC EQUIVALENT FOR PROAIR HFA 34 g 2   albuterol (VENTOLIN HFA) 108 (90 Base) MCG/ACT inhaler Inhale 2 puffs into the lungs every 6 (six) hours as needed. 18 g 0   Calcium Citrate-Vitamin D (CITRACAL + D PO) Take 2 tablets by mouth 2 (two) times daily.      citalopram (CELEXA) 40 MG tablet Take 1 tablet by mouth once daily 90 tablet 0   Coenzyme Q10 (COQ10) 200 MG CAPS Take 1 capsule by mouth daily.     EPINEPHrine 0.3 mg/0.3 mL IJ SOAJ injection Inject 0.3 mLs  (0.3 mg total) into the muscle once. 1 Device 1   Ferrous Fumarate-Folic Acid (HEMOCYTE-F) 324-1 MG TABS Take 1 tablet by mouth daily. 30 tablet 3   fluticasone (FLONASE) 50 MCG/ACT nasal spray Place 2 sprays into both nostrils daily. 16 g 1   Fluticasone-Umeclidin-Vilant (TRELEGY ELLIPTA) 100-62.5-25 MCG/ACT AEPB Inhale 1 puff into the lungs daily. 1 each 11   guaiFENesin (MUCINEX) 600 MG 12 hr tablet Take 1 tablet (600 mg total) by mouth 2 (  two) times daily. 20 tablet 0   losartan (COZAAR) 50 MG tablet TAKE 1 TABLET(50 MG TOTAL) BY MOUTH DAILY 90 tablet 1   MELATONIN PO Take 1 tablet by mouth at bedtime.     metFORMIN (GLUCOPHAGE) 500 MG tablet Take 2 tablets (1,000 mg total) by mouth daily. with food 270 tablet 1   metoprolol succinate (TOPROL-XL) 25 MG 24 hr tablet TAKE 1 TABLET(25 MG TOTAL) BY MOUTH DAILY 90 tablet 1   Multiple Vitamins-Minerals (ZINC PO) Take 1 tablet by mouth daily.     omeprazole (PRILOSEC) 20 MG capsule TAKE ONE CAPSULE BY MOUTH TWICE DAILY AS NEEDED 180 capsule 1   pravastatin (PRAVACHOL) 10 MG tablet TAKE 1 TABLET(10 MG TOTAL) BY MOUTH DAILY. 90 tablet 1   Probiotic Product (PROBIOTIC PO) Take 1 capsule by mouth daily.     aspirin EC 81 MG tablet Take 1 tablet (81 mg total) by mouth daily. Swallow whole. 30 tablet 11   benzonatate (TESSALON) 100 MG capsule Take 1 capsule (100 mg total) by mouth 3 (three) times daily as needed for cough. 30 capsule 0   Facility-Administered Medications Prior to Visit  Medication Dose Route Frequency Provider Last Rate Last Admin   mitoMYcin (MUTAMYCIN) chemo injection 40 mg  40 mg Bladder Instillation Once Franchot Gallo, MD        Allergies  Allergen Reactions   Bee Venom Shortness Of Breath   Other Shortness Of Breath    MSG.   Latex Itching    Review of Systems  Skin:  Positive for itching (on left forearm and behind right upper arm).  Endo/Heme/Allergies:        (+)bruising on left forearm       Objective:     Physical Exam Constitutional:      General: She is not in acute distress.    Appearance: Normal appearance. She is not ill-appearing.  HENT:     Head: Normocephalic and atraumatic.     Right Ear: External ear normal.     Left Ear: External ear normal.  Eyes:     Extraocular Movements: Extraocular movements intact.     Pupils: Pupils are equal, round, and reactive to light.  Cardiovascular:     Rate and Rhythm: Normal rate.  Pulmonary:     Effort: Pulmonary effort is normal.     Breath sounds: Normal breath sounds.  Skin:    General: Skin is warm and dry.     Comments: Scabbed lesion right forearm, some associated bruising surrounding the bite  Another bite noted left posterior upper arm with mild surrounding erythema/bruising  Neurological:     Mental Status: She is alert and oriented to person, place, and time.  Psychiatric:        Judgment: Judgment normal.     BP (!) 129/54 (BP Location: Right Arm, Patient Position: Sitting, Cuff Size: Large)   Pulse 91   Temp 99 F (37.2 C) (Oral)   Resp 16   Wt 196 lb (88.9 kg)   SpO2 96%   BMI 34.72 kg/m  Wt Readings from Last 3 Encounters:  10/13/21 196 lb (88.9 kg)  12/24/20 192 lb 9.6 oz (87.4 kg)  10/21/20 190 lb (86.2 kg)       Assessment & Plan:   Problem List Items Addressed This Visit       Unprioritized   Bite, insect - Primary    New.  She has DM2 and does not check her sugars.  I will  only prescribe a low dose of prednisone for this reason.  Pt is advised as follows:  Please begin prednisone one tab once daily in the AM for 5 days. You may apply betamethasone cream twice daily as needed to affected areas. Continue allegra. You may use benadryl '25mg'$  at bedtime as needed for itching/sleep. Call if increased swelling, redness or if symptoms do not improve.         Meds ordered this encounter  Medications   predniSONE (DELTASONE) 20 MG tablet    Sig: Take 1 tablet (20 mg total) by mouth daily with  breakfast.    Dispense:  5 tablet    Refill:  0    Order Specific Question:   Supervising Provider    Answer:   Penni Homans A [[id number]]   betamethasone valerate ointment (VALISONE) 0.1 %    Sig: Apply 1 Application topically 2 (two) times daily.    Dispense:  15 g    Refill:  0    Order Specific Question:   Supervising Provider    Answer:   Penni Homans A [[id number]]    I, Nance Pear, NP, personally preformed the services described in this documentation.  All medical record entries made by the scribe were at my direction and in my presence.  I have reviewed the chart and discharge instructions (if applicable) and agree that the record reflects my personal performance and is accurate and complete. 10/13/2021   I,Savannah Stanley,acting as a Education administrator for Nance Pear, NP.,have documented all relevant documentation on the behalf of Nance Pear, NP,as directed by  Nance Pear, NP while in the presence of Nance Pear, NP.   Nance Pear, NP

## 2021-10-13 NOTE — Telephone Encounter (Signed)
Pt stated she wanted to make an appt for spider bites. She stated her arms are black and blue and was unsure if this was from scratching. She also stated it spread to her fingers and they were very red and she was itching all over. Transferred to triage for a nurse to eval.

## 2021-10-13 NOTE — Telephone Encounter (Signed)
Pt seen today by Melissa.

## 2021-10-13 NOTE — Telephone Encounter (Signed)
Nurse Assessment Nurse: Zorita Pang, RN, Deborah Date/Time (Eastern Time): 10/13/2021 9:23:25 AM Confirm and document reason for call. If symptomatic, describe symptoms. ---Noticed a place on her right arm. There was a little blister that she must have scratched off. She states that she has a 4-5 inch area of redness and bruising. She states that there is a bite on her left shoulder. Does the patient have any new or worsening symptoms? ---Yes Will a triage be completed? ---Yes Related visit to physician within the last 2 weeks? ---No Does the PT have any chronic conditions? (i.e. diabetes, asthma, this includes High risk factors for pregnancy, etc.) ---Yes List chronic conditions. ---COPD, anemia Is this a behavioral health or substance abuse call? ---No Guidelines Guideline Title Affirmed Question Affirmed Notes Nurse Date/Time ([place] Time) Spider Bite - [place] [1] Red or very tender (to touch) area AND [2] getting larger over 48 hours after the bite Womble, RN, Deborah 10/13/2021 9:26:04 AM Disp. Time Eilene Ghazi Time) Disposition Final User 10/13/2021 9:30:21 AM See PCP within 24 Hours Yes Womble, RN, Neoma Laming PLEASE NOTE: All timestamps contained within this report are represented as [place] Standard Time. CONFIDENTIALTY NOTICE: This fax transmission is intended only for the addressee. It contains information that is legally privileged, confidential or otherwise protected from use or disclosure. If you are not the intended recipient, you are strictly prohibited from reviewing, disclosing, copying using or disseminating any of this information or taking any action in reliance on or regarding this information. If you have received this fax in error, please notify us immediately by telephone so that we can arrange for its return to [place]. Phone: [phone], Toll-Free: [phone], Fax: [phone] Page: 2 of 2 Call Id: [id number] Final Disposition 10/13/2021 9:30:21 AM See PCP  within 24 Hours Yes Zorita Pang, RN, Garrel Ridgel Disagree/Comply Comply Caller Understands Yes PreDisposition Call Doctor Care Advice Given Per Guideline SEE PCP WITHIN 24 HOURS: * Use Bacitracin ointment (OTC in [place] or Polysporin ointment (OTC in [place]) or one that you already have. Referrals REFERRED TO PCP OFFICE

## 2021-10-13 NOTE — Assessment & Plan Note (Signed)
New.  She has DM2 and does not check her sugars.  I will only prescribe a low dose of prednisone for this reason.  Pt is advised as follows:  Please begin prednisone one tab once daily in the AM for 5 days. You may apply betamethasone cream twice daily as needed to affected areas. Continue allegra. You may use benadryl '25mg'$  at bedtime as needed for itching/sleep. Call if increased swelling, redness or if symptoms do not improve.

## 2021-10-13 NOTE — Patient Instructions (Signed)
Please begin prednisone one tab once daily in the AM for 5 days. You may apply betamethasone cream twice daily as needed to affected areas. Continue allegra. You may use benadryl '25mg'$  at bedtime as needed for itching/sleep. Call if increased swelling, redness or if symptoms do not improve.

## 2021-10-14 ENCOUNTER — Telehealth: Payer: Self-pay | Admitting: Family Medicine

## 2021-10-14 NOTE — Telephone Encounter (Signed)
Pharmacy called stating they have a different dosage for the patient's metformin so they would like to confirm that this is a new rx and not an error. They can be reached at [phone]. Please advise.

## 2021-10-14 NOTE — Telephone Encounter (Signed)
Pharmacy sent fax, faxed response back. Pt takes 2 metformin tablets in the morning and 1 in the pm.

## 2021-10-15 ENCOUNTER — Other Ambulatory Visit: Payer: Self-pay

## 2021-10-15 ENCOUNTER — Telehealth: Payer: Self-pay

## 2021-10-15 MED ORDER — BETAMETHASONE VALERATE 0.1 % EX CREA: TOPICAL_CREAM | Freq: Two times a day (BID) | CUTANEOUS | 0 refills | Status: DC

## 2021-10-15 NOTE — Telephone Encounter (Signed)
Reordered as a cream instead of ointment.

## 2021-10-15 NOTE — Telephone Encounter (Signed)
Nurse Assessment Nurse: Janene Madeira, RN, Wells Guiles Date/Time (Eastern Time): 10/14/2021 6:03:08 PM Confirm and document reason for call. If symptomatic, describe symptoms. ---Caller states she is needing a cream instead of ointment because she is afraid it is going to make her sheets messy. the name is betamethasone. pt spoke with the pharmacy and pharmacy said that they need a new script for the medication. pt denies any new signs or symptoms Does the patient have any new or worsening symptoms? ---No Disp. Time Eilene Ghazi Time) Disposition Final User 10/14/2021 6:08:16 PM Clinical Call Yes Janene Madeira, RN, Wells Guiles Final Disposition 10/14/2021 6:08:16 PM Clinical Call Yes Janene Madeira, RN, Wells Guiles Comments User: Signe Colt, RN Date/Time Eilene Ghazi Time): 10/14/2021 6:07:25 PM Caller advised that this medication issue will need to be taken care of during business hours. Advised that message will be sent to the office and that they should also follow up with the office when they open back up. Verbalizes understanding and denies further questions or concerns. Advised to call back with further questions or concerns

## 2021-10-23 ENCOUNTER — Ambulatory Visit (INDEPENDENT_AMBULATORY_CARE_PROVIDER_SITE_OTHER): Payer: Medicare Other

## 2021-10-23 DIAGNOSIS — Z0000 Encounter for general adult medical examination without abnormal findings: Secondary | ICD-10-CM | POA: Diagnosis not present

## 2021-10-23 NOTE — Progress Notes (Signed)
Subjective:   Savannah Stanley is a 71 y.o. female who presents for Medicare Annual (Subsequent) preventive examination.  I connected with  Pearletha Alfred on 10/23/21 by a audio enabled telemedicine application and verified that I am speaking with the correct person using two identifiers.  Patient Location: Home  Provider Location: Office/Clinic  I discussed the limitations of evaluation and management by telemedicine. The patient expressed understanding and agreed to proceed.   Review of Systems     Cardiac Risk Factors include: advanced age (>78mn, >>39women);dyslipidemia;diabetes mellitus     Objective:    There were no vitals filed for this visit. There is no height or weight on file to calculate BMI.     10/23/2021    2:26 PM 10/21/2020    2:29 PM 10/19/2019    8:56 AM 10/18/2018    8:12 AM 08/24/2017    8:13 AM 08/07/2015   10:57 PM 11/17/2013   11:06 AM  Advanced Directives  Does Patient Have a Medical Advance Directive? Yes Yes No No Yes No No;Yes  Type of AParamed[place]of [place] will [place]Living will   [place] will  [place] will  Does patient want to make changes to medical advance directive? No - Patient declined        Copy of [place] Chart? No - copy requested No - copy requested   No - copy requested  No - copy requested  Would patient like information on creating a medical advance directive?   No - Patient declined No - Patient declined  Yes - Educational materials given No - patient declined information    Current Medications (verified) Outpatient Encounter Medications as of 10/23/2021  Medication Sig   albuterol (VENTOLIN HFA) 108 (90 Base) MCG/ACT inhaler INHALE 2 PUFFS BY MOUTH INTO THE LUNGS EVERY 6 HOURS AS NEEDED FOR WHEEZING AND SHORTNESS OF BREATH. GENERIC EQUIVALENT FOR PROAIR HFA   betamethasone valerate (VALISONE) 0.1 % cream  Apply topically 2 (two) times daily.   Calcium Citrate-Vitamin D (CITRACAL + D PO) Take 2 tablets by mouth 2 (two) times daily.    citalopram (CELEXA) 40 MG tablet Take 1 tablet by mouth once daily   Coenzyme Q10 (COQ10) 200 MG CAPS Take 1 capsule by mouth daily.   EPINEPHrine 0.3 mg/0.3 mL IJ SOAJ injection Inject 0.3 mLs (0.3 mg total) into the muscle once.   Ferrous Fumarate-Folic Acid (HEMOCYTE-F) 324-1 MG TABS Take 1 tablet by mouth daily.   fluticasone (FLONASE) 50 MCG/ACT nasal spray Place 2 sprays into both nostrils daily.   Fluticasone-Umeclidin-Vilant (TRELEGY ELLIPTA) 100-62.5-25 MCG/ACT AEPB Inhale 1 puff into the lungs daily.   guaiFENesin (MUCINEX) 600 MG 12 hr tablet Take 1 tablet (600 mg total) by mouth 2 (two) times daily.   losartan (COZAAR) 50 MG tablet TAKE 1 TABLET(50 MG TOTAL) BY MOUTH DAILY   MELATONIN PO Take 1 tablet by mouth at bedtime.   metFORMIN (GLUCOPHAGE) 500 MG tablet Take 2 tablets (1,000 mg total) by mouth daily. with food   metoprolol succinate (TOPROL-XL) 25 MG 24 hr tablet TAKE 1 TABLET(25 MG TOTAL) BY MOUTH DAILY   Multiple Vitamins-Minerals (ZINC PO) Take 1 tablet by mouth daily.   omeprazole (PRILOSEC) 20 MG capsule TAKE ONE CAPSULE BY MOUTH TWICE DAILY AS NEEDED   pravastatin (PRAVACHOL) 10 MG tablet TAKE 1 TABLET(10 MG TOTAL) BY MOUTH DAILY.   Probiotic Product (PROBIOTIC PO)  Take 1 capsule by mouth daily.   [DISCONTINUED] albuterol (VENTOLIN HFA) 108 (90 Base) MCG/ACT inhaler Inhale 2 puffs into the lungs every 6 (six) hours as needed.   [DISCONTINUED] predniSONE (DELTASONE) 20 MG tablet Take 1 tablet (20 mg total) by mouth daily with breakfast.   Facility-Administered Encounter Medications as of 10/23/2021  Medication   mitoMYcin (MUTAMYCIN) chemo injection 40 mg    Allergies (verified) Bee venom, Other, and Latex   History: Past Medical History:  Diagnosis Date   Acute bronchitis with COPD ([place]) 03/19/2015   Bladder cancer ([place]) UROLOGIST--  DR Diona Fanti   HIGH-GRADE UROTHELIAL CARCINOMA   Bladder tumor "multiple"   "some ORs; some removed in office"   COPD (chronic obstructive pulmonary disease) ([place])    Depression    Diabetes mellitus type 2 in obese ([place]) 01/31/2015   GERD (gastroesophageal reflux disease)    H/O hiatal hernia    High cholesterol    History of blood transfusion 01/2013 - 02/2013   related to ORs   Hypertension    Iron deficiency anemia    Murmur, heart 05/20/2014   Pneumonia 07/2010; 08/07/2015   Type 2 diabetes mellitus [facility])    Past Surgical History:  Procedure Laterality Date   APPENDECTOMY  1980's   CYSTOSCOPY N/A 12/18/2013   Procedure: CYSTOSCOPY;  Surgeon: Jorja Loa, MD;  Location[facility];  Service: Urology;  Laterality: N/A;   TRANSURETHRAL RESECTION OF BLADDER TUMOR N/A 02/16/2013   Procedure: TRANSURETHRAL RESECTION OF BLADDER TUMOR (TURBT);  Surgeon: Franchot Gallo, MD;  Location: [facility];  Service: Urology;  Laterality: N/A;   TRANSURETHRAL RESECTION OF BLADDER TUMOR N/A 12/18/2013   Procedure: TRANSURETHRAL RESECTION OF BLADDER TUMOR (TURBT) ;  Surgeon: Jorja Loa, MD;  [facility];  Service: Urology;  Laterality: N/A;   TRANSURETHRAL RESECTION OF BLADDER TUMOR WITH GYRUS (TURBT-GYRUS) N/A 01/23/2013   Procedure: TRANSURETHRAL RESECTION OF BLADDER TUMOR WITH GYRUS ;  Surgeon: Franchot Gallo, MD;  Location: [facility];  Service: Urology;  Laterality: N/A;   TUBAL LIGATION  1980's   Family History  Problem Relation Age of Onset   Cancer Father        prostate   Arthritis Brother    Frontotemporal dementia Brother    Dementia Brother    Cancer Daughter    Social History   Socioeconomic History   Marital status: Widowed    Spouse name: Not on file   Number of children: Not on file   Years of education: Not on file   Highest education level: Not on file  Occupational History   Not on file  Tobacco  Use   Smoking status: Former    Packs/day: 1.00    Years: 35.00    Total pack years: 35.00    Types: Cigarettes    Quit date: 07/15/2010    Years since quitting: 11.2   Smokeless tobacco: Never  Substance and Sexual Activity   Alcohol use: Yes    Comment: wine socially    Drug use: No   Sexual activity: Not on file  Other Topics Concern   Not on file  Social History Narrative   Not on file   Social Determinants of Health   Financial Resource Strain: Low Risk  (10/21/2020)   Overall Financial Resource Strain (CARDIA)    Difficulty of Paying Living Expenses: Not hard at all  Food Insecurity: No Food Insecurity (10/21/2020)   Hunger Vital Sign  Worried About Charity fundraiser in the Last Year: Never true    [place] in the Last Year: Never true  Transportation Needs: No Transportation Needs (10/21/2020)   PRAPARE - Hydrologist (Medical): No    Lack of Transportation (Non-Medical): No  Physical Activity: Inactive (10/21/2020)   Exercise Vital Sign    Days of Exercise per Week: 0 days    Minutes of Exercise per Session: 0 min  Stress: No Stress Concern Present (10/21/2020)   [place]    Feeling of Stress : Only a little  Social Connections: Moderately Isolated (10/21/2020)   Social Connection and Isolation Panel [NHANES]    Frequency of Communication with Friends and Family: More than three times a week    Frequency of Social Gatherings with Friends and Family: More than three times a week    Attends Religious Services: More than 4 times per year    Active Member of [employer] or Organizations: No    Attends Archivist Meetings: Never    Marital Status: Widowed    Tobacco Counseling Counseling given: Not Answered   Clinical Intake:  Pre-visit preparation completed: Yes  Pain : No/denies pain     Nutritional Risks: None Diabetes: Yes CBG done?: No Did pt. bring  in CBG monitor from home?: No  How often do you need to have someone help you when you read instructions, pamphlets, or other written materials from your doctor or pharmacy?: 1 - Never  Diabetic?yes Nutrition Risk Assessment:  Has the patient had any N/V/D within the last 2 months?  No  Does the patient have any non-healing wounds?  No  Has the patient had any unintentional weight loss or weight gain?  No   Diabetes:  Is the patient diabetic?  Yes  If diabetic, was a CBG obtained today?  No  Did the patient bring in their glucometer from home?  No  How often do you monitor your CBG's? N/a.   Financial Strains and Diabetes Management:  Are you having any financial strains with the device, your supplies or your medication? No .  Does the patient want to be seen by Chronic Care Management for management of their diabetes?  No  Would the patient like to be referred to a Nutritionist or for Diabetic Management?  No   Diabetic Exams:  Diabetic Eye Exam: Overdue for diabetic eye exam. Pt has been advised about the importance in completing this exam. Patient advised to call and schedule an eye exam. Diabetic Foot Exam: Completed 12/24/20    Interpreter Needed?: No  Information entered by :: [place] of Daily Living    10/23/2021    2:28 PM  In your present state of health, do you have any difficulty performing the following activities:  Hearing? 0  Vision? 0  Difficulty concentrating or making decisions? 0  Walking or climbing stairs? 0  Dressing or bathing? 0  Doing errands, shopping? 0  Preparing Food and eating ? N  Using the Toilet? N  In the past six months, have you accidently leaked urine? N  Do you have problems with loss of bowel control? N  Managing your Medications? N  Managing your Finances? N  Housekeeping or managing your Housekeeping? N    Patient Care Team: [name], MD as PCP - General (Family Medicine) Franchot Gallo, MD  as Consulting Physician (Urology)  Indicate any recent Medical Services you may have received from other than [facility] providers in the past year (date may be approximate).     Assessment:   This is a routine wellness examination for [facility].  Hearing/Vision screen No results found.  Dietary issues and exercise activities discussed: Current Exercise Habits: The patient does not participate in regular exercise at present, Exercise limited by: None identified   Goals Addressed             This Visit's Progress    DIET - EAT MORE FRUITS AND VEGETABLES   On track    Increase physical activity   On track    Restart swimming/water aerobics this summer.       Depression Screen    10/23/2021    2:26 PM 10/21/2020    2:30 PM 10/19/2019    8:59 AM 10/18/2018    8:12 AM 08/24/2017    8:18 AM 08/20/2016    8:49 AM 07/15/2015    1:08 PM  PHQ 2/9 Scores  PHQ - 2 Score 0 0 0 0 0 0 0    Fall Risk    10/23/2021    2:26 PM 10/21/2020    2:30 PM 10/19/2019    8:59 AM 10/18/2018    8:12 AM 08/24/2017    8:18 AM  Fall Risk   Falls in the past year? 0 0 0 0 No  Number falls in past yr: 0 0 0    Injury with Fall? 0 0 0    Risk for fall due to : No Fall Risks      Follow up Falls evaluation completed Falls prevention discussed Education provided;Falls prevention discussed      FALL RISK PREVENTION PERTAINING TO THE HOME:  Any stairs in or around the home? Yes  If so, are there any without handrails? No  Home free of loose throw rugs in walkways, pet beds, electrical cords, etc? Yes  Adequate lighting in your home to reduce risk of falls? Yes   ASSISTIVE DEVICES UTILIZED TO PREVENT FALLS:  Life alert? No  Use of a cane, walker or w/c? No  Grab bars in the bathroom? No  Shower chair or bench in shower? Yes  Elevated toilet seat or a handicapped toilet? No   TIMED UP AND GO:  Was the test performed? No .    Cognitive Function:    08/20/2016    8:54 AM  MMSE - Mini Mental State Exam   Orientation to time 5  Orientation to Place 5  Registration 3  Attention/ Calculation 5  Recall 3  Language- name 2 objects 2  Language- repeat 1  Language- follow 3 step command 3  Language- read & follow direction 1  Write a sentence 1  Copy design 1  Total score 30        10/23/2021    2:31 PM  6CIT Screen  What Year? 0 points  What month? 0 points  What time? 0 points  Count back from 20 0 points  Months in reverse 0 points  Repeat phrase 0 points  Total Score 0 points    Immunizations Immunization History  Administered Date(s) Administered   Fluad Quad(high Dose 65+) 11/03/2018   Influenza Whole 11/24/2010   Influenza, High Dose Seasonal PF 12/07/2020   Influenza, Seasonal, Injecte, Preservative Fre 12/28/2014   Influenza,inj,Quad PF,6+ Mos 11/17/2013   Influenza-Unspecified 11/17/2012, 11/29/2015, 11/14/2017   Moderna Covid-19 Vaccine Bivalent Booster 34yr & up 12/18/2020   MLevan Hurst  SARS-COV2 Booster Vaccination 12/18/2020   Moderna Sars-Covid-2 Vaccination 04/14/2019, 05/09/2019, 01/17/2020, 07/30/2020   Pfizer Covid-19 Vaccine Bivalent Booster 45yr & up 12/18/2020   Pneumococcal Conjugate-13 11/17/2013   Pneumococcal Polysaccharide-23 11/09/2011, 02/12/2017   Td 05/22/2019   Tdap 11/24/2010   Zoster Recombinat (Shingrix) 10/02/2017, 11/29/2017   Zoster, Live 03/17/2007    TDAP status: Up to date  Flu Vaccine status: Up to date  Pneumococcal vaccine status: Up to date  Covid-19 vaccine status: Completed vaccines  Qualifies for Shingles Vaccine? Yes   Zostavax completed No   Shingrix Completed?: Yes  Screening Tests Health Maintenance  Topic Date Due   OPHTHALMOLOGY EXAM  Never done   MAMMOGRAM  11/23/2012   COLONOSCOPY (Pts 45-469yrInsurance coverage will need to be confirmed)  11/23/2020   COVID-19 Vaccine (5 - Moderna risk series) 02/12/2021   INFLUENZA VACCINE  10/14/2021   HEMOGLOBIN A1C  10/22/2021   FOOT EXAM  12/24/2021   DEXA  SCAN  09/11/2022   TETANUS/TDAP  05/21/2029   Pneumonia Vaccine 6559Years old  Completed   Hepatitis C Screening  Completed   Zoster Vaccines- Shingrix  Completed   HPV VACCINES  Aged Out    Health Maintenance  Health Maintenance Due  Topic Date Due   OPHTHALMOLOGY EXAM  Never done   MAMMOGRAM  11/23/2012   COLONOSCOPY (Pts [id number] coverage will need to be confirmed)  11/23/2020   COVID-19 Vaccine (5 - Moderna risk series) 02/12/2021   INFLUENZA VACCINE  10/14/2021   HEMOGLOBIN A1C  10/22/2021    Colorectal cancer screening: Referral to GI placed declined. Pt aware the office will call re: appt.  Mammogram status: Ordered declined. Pt provided with contact info and advised to call to schedule appt.   Bone Density status: Completed 09/10/20. Results reflect: Bone density results: NORMAL. Repeat every 2 years.  Lung Cancer Screening: (Low Dose CT Chest recommended if Age 36-65-80ars, 30 pack-year currently smoking OR have quit w/in 15years.) does not qualify.   Lung Cancer Screening Referral: n/a  Additional Screening:  Hepatitis C Screening: does qualify; Completed 01/16/16  Vision Screening: Recommended annual ophthalmology exams for early detection of glaucoma and other disorders of the eye. Is the patient up to date with their annual eye exam?  No  Who is the provider or what is the name of the office in which the patient attends annual eye exams? N/a If pt is not established with a provider, would they like to be referred to a provider to establish care? No .   Dental Screening: Recommended annual dental exams for proper oral hygiene  Community Resource Referral / Chronic Care Management: CRR required this visit?  No   CCM required this visit?  No      Plan:     I have personally reviewed and noted the following in the patient's chart:   Medical and social history Use of alcohol, tobacco or illicit drugs  Current medications and supplements including  opioid prescriptions.  Functional ability and status Nutritional status Physical activity Advanced directives List of other physicians Hospitalizations, surgeries, and ER visits in previous 12 months Vitals Screenings to include cognitive, depression, and falls Referrals and appointments  In addition, I have reviewed and discussed with patient certain preventive protocols, quality metrics, and best practice recommendations. A written personalized care plan for preventive services as well as general preventive health recommendations were provided to patient.   Due to this being a telephonic visit, the after visit summary  with patients personalized plan was offered to patient via mail or my-chart.  Patient would like to access on my-chart   Nicholaus Corolla, [place]   10/23/2021   Nurse Notes: none

## 2021-10-23 NOTE — Patient Instructions (Signed)
[name] , Thank you for taking time to come for your Medicare Wellness Visit. I appreciate your ongoing commitment to your health goals. Please review the following plan we discussed and let me know if I can assist you in the future.   Screening recommendations/referrals: Colonoscopy: declined Mammogram: declined Bone Density: 09/10/20 due 09/11/22 Recommended yearly ophthalmology/optometry visit for glaucoma screening and checkup Recommended yearly dental visit for hygiene and checkup  Vaccinations: Influenza vaccine: up to date Pneumococcal vaccine: up to date Tdap vaccine: up to date Shingles vaccine: up to date   Covid-19:completed  Advanced directives: yes, not on file  Conditions/risks identified: see problem list   Next appointment: Follow up in one year for your annual wellness visit 10/27/22   Preventive Care 73 Years and Older, Female Preventive care refers to lifestyle choices and visits with your health care provider that can promote health and wellness. What does preventive care include? A yearly physical exam. This is also called an annual well check. Dental exams once or twice a year. Routine eye exams. Ask your health care provider how often you should have your eyes checked. Personal lifestyle choices, including: Daily care of your teeth and gums. Regular physical activity. Eating a healthy diet. Avoiding tobacco and drug use. Limiting alcohol use. Practicing safe sex. Taking low-dose aspirin every day. Taking vitamin and mineral supplements as recommended by your health care provider. What happens during an annual well check? The services and screenings done by your health care provider during your annual well check will depend on your age, overall health, lifestyle risk factors, and family history of disease. Counseling  Your health care provider may ask you questions about your: Alcohol use. Tobacco use. Drug use. Emotional well-being. Home and  relationship well-being. Sexual activity. Eating habits. History of falls. Memory and ability to understand (cognition). Work and work Statistician. Reproductive health. Screening  You may have the following tests or measurements: Height, weight, and BMI. Blood pressure. Lipid and cholesterol levels. These may be checked every 5 years, or more frequently if you are over 54 years old. Skin check. Lung cancer screening. You may have this screening every year starting at age 64 if you have a 30-pack-year history of smoking and currently smoke or have quit within the past 15 years. Fecal occult blood test (FOBT) of the stool. You may have this test every year starting at age 39. Flexible sigmoidoscopy or colonoscopy. You may have a sigmoidoscopy every 5 years or a colonoscopy every 10 years starting at age 39. Hepatitis C blood test. Hepatitis B blood test. Sexually transmitted disease (STD) testing. Diabetes screening. This is done by checking your blood sugar (glucose) after you have not eaten for a while (fasting). You may have this done every 1-3 years. Bone density scan. This is done to screen for osteoporosis. You may have this done starting at age 18. Mammogram. This may be done every 1-2 years. Talk to your health care provider about how often you should have regular mammograms. Talk with your health care provider about your test results, treatment options, and if necessary, the need for more tests. Vaccines  Your health care provider may recommend certain vaccines, such as: Influenza vaccine. This is recommended every year. Tetanus, diphtheria, and acellular pertussis (Tdap, Td) vaccine. You may need a Td booster every 10 years. Zoster vaccine. You may need this after age 5. Pneumococcal 13-valent conjugate (PCV13) vaccine. One dose is recommended after age 15. Pneumococcal polysaccharide (PPSV23) vaccine. One dose is  recommended after age 38. Talk to your health care provider  about which screenings and vaccines you need and how often you need them. This information is not intended to replace advice given to you by your health care provider. Make sure you discuss any questions you have with your health care provider. Document Released: 03/29/2015 Document Revised: 11/20/2015 Document Reviewed: 01/01/2015 Elsevier Interactive Patient Education  2017 [place] Prevention in the Home Falls can cause injuries. They can happen to people of all ages. There are many things you can do to make your home safe and to help prevent falls. What can I do on the outside of my home? Regularly fix the edges of walkways and driveways and fix any cracks. Remove anything that might make you trip as you walk through a door, such as a raised step or threshold. Trim any bushes or trees on the path to your home. Use bright outdoor lighting. Clear any walking paths of anything that might make someone trip, such as rocks or tools. Regularly check to see if handrails are loose or broken. Make sure that both sides of any steps have handrails. Any raised decks and porches should have guardrails on the edges. Have any leaves, snow, or ice cleared regularly. Use sand or salt on walking paths during winter. Clean up any spills in your garage right away. This includes oil or grease spills. What can I do in the bathroom? Use night lights. Install grab bars by the toilet and in the tub and shower. Do not use towel bars as grab bars. Use non-skid mats or decals in the tub or shower. If you need to sit down in the shower, use a plastic, non-slip stool. Keep the floor dry. Clean up any water that spills on the floor as soon as it happens. Remove soap buildup in the tub or shower regularly. Attach bath mats securely with double-sided non-slip rug tape. Do not have throw rugs and other things on the floor that can make you trip. What can I do in the bedroom? Use night lights. Make sure  that you have a light by your bed that is easy to reach. Do not use any sheets or blankets that are too big for your bed. They should not hang down onto the floor. Have a firm chair that has side arms. You can use this for support while you get dressed. Do not have throw rugs and other things on the floor that can make you trip. What can I do in the kitchen? Clean up any spills right away. Avoid walking on wet floors. Keep items that you use a lot in easy-to-reach places. If you need to reach something above you, use a strong step stool that has a grab bar. Keep electrical cords out of the way. Do not use floor polish or wax that makes floors slippery. If you must use wax, use non-skid floor wax. Do not have throw rugs and other things on the floor that can make you trip. What can I do with my stairs? Do not leave any items on the stairs. Make sure that there are handrails on both sides of the stairs and use them. Fix handrails that are broken or loose. Make sure that handrails are as long as the stairways. Check any carpeting to make sure that it is firmly attached to the stairs. Fix any carpet that is loose or worn. Avoid having throw rugs at the top or bottom of the stairs. If  you do have throw rugs, attach them to the floor with carpet tape. Make sure that you have a light switch at the top of the stairs and the bottom of the stairs. If you do not have them, ask someone to add them for you. What else can I do to help prevent falls? Wear shoes that: Do not have high heels. Have rubber bottoms. Are comfortable and fit you well. Are closed at the toe. Do not wear sandals. If you use a stepladder: Make sure that it is fully opened. Do not climb a closed stepladder. Make sure that both sides of the stepladder are locked into place. Ask someone to hold it for you, if possible. Clearly mark and make sure that you can see: Any grab bars or handrails. First and last steps. Where the edge of  each step is. Use tools that help you move around (mobility aids) if they are needed. These include: Canes. Walkers. Scooters. Crutches. Turn on the lights when you go into a dark area. Replace any light bulbs as soon as they burn out. Set up your furniture so you have a clear path. Avoid moving your furniture around. If any of your floors are uneven, fix them. If there are any pets around you, be aware of where they are. Review your medicines with your doctor. Some medicines can make you feel dizzy. This can increase your chance of falling. Ask your doctor what other things that you can do to help prevent falls. This information is not intended to replace advice given to you by your health care provider. Make sure you discuss any questions you have with your health care provider. Document Released: 12/27/2008 Document Revised: 08/08/2015 Document Reviewed: 04/06/2014 Elsevier Interactive Patient Education  2017 [employer].

## 2021-10-28 ENCOUNTER — Other Ambulatory Visit: Payer: Self-pay | Admitting: *Deleted

## 2021-10-28 DIAGNOSIS — I10 Essential (primary) hypertension: Secondary | ICD-10-CM

## 2021-10-28 MED ORDER — METOPROLOL SUCCINATE ER 25 MG PO TB24: ORAL_TABLET | ORAL | 1 refills | Status: DC

## 2021-10-28 MED ORDER — LOSARTAN POTASSIUM 50 MG PO TABS: ORAL_TABLET | ORAL | 1 refills | Status: DC

## 2021-11-17 ENCOUNTER — Other Ambulatory Visit: Payer: Self-pay | Admitting: Family Medicine

## 2021-11-17 DIAGNOSIS — F32A Depression, unspecified: Secondary | ICD-10-CM

## 2021-12-02 ENCOUNTER — Other Ambulatory Visit: Payer: Self-pay

## 2021-12-02 MED ORDER — PRAVASTATIN SODIUM 10 MG PO TABS: ORAL_TABLET | ORAL | 1 refills | Status: DC

## 2021-12-03 NOTE — Assessment & Plan Note (Signed)
No recent exacerbation 

## 2021-12-03 NOTE — Assessment & Plan Note (Signed)
hgba1c acceptable, minimize simple carbs. Increase exercise as tolerated. Continue current meds 

## 2021-12-03 NOTE — Assessment & Plan Note (Addendum)
Patient encouraged to maintain heart healthy diet, regular exercise, adequate sleep. Consider daily probiotics. Take medications as prescribed. Labs ordered and reviewed. Dexa scan 08/2020 was normal repeat in 2027 MGM, declines to do them  Colonoscopy, declines to proceed Pap, stopped in 50s RSV (respiratory syncitial virus) vaccine at pharmacy Covid booster when new version out late September At pharmacy High dose flu shot mid Sept to mid Oct

## 2021-12-03 NOTE — Assessment & Plan Note (Signed)
Well controlled, no changes to meds. Encouraged heart healthy diet such as the DASH diet and exercise as tolerated.  °

## 2021-12-03 NOTE — Assessment & Plan Note (Signed)
Supplement and monitor 

## 2021-12-03 NOTE — Assessment & Plan Note (Signed)
Encourage heart healthy diet such as MIND or DASH diet, increase exercise, avoid trans fats, simple carbohydrates and processed foods, consider a krill or fish or flaxseed oil cap daily. Tolerating Pravastatin 

## 2021-12-04 ENCOUNTER — Ambulatory Visit (INDEPENDENT_AMBULATORY_CARE_PROVIDER_SITE_OTHER): Payer: Medicare Other | Admitting: Family Medicine

## 2021-12-04 ENCOUNTER — Telehealth: Payer: Self-pay

## 2021-12-04 ENCOUNTER — Encounter: Payer: Self-pay | Admitting: Family Medicine

## 2021-12-04 ENCOUNTER — Other Ambulatory Visit (INDEPENDENT_AMBULATORY_CARE_PROVIDER_SITE_OTHER): Payer: Medicare Other

## 2021-12-04 VITALS — BP 128/62 | HR 82 | Temp 98.0°F | Resp 16 | Ht 63.0 in | Wt 196.0 lb

## 2021-12-04 DIAGNOSIS — E1169 Type 2 diabetes mellitus with other specified complication: Secondary | ICD-10-CM

## 2021-12-04 DIAGNOSIS — E669 Obesity, unspecified: Secondary | ICD-10-CM

## 2021-12-04 DIAGNOSIS — Z0000 Encounter for general adult medical examination without abnormal findings: Secondary | ICD-10-CM | POA: Diagnosis not present

## 2021-12-04 DIAGNOSIS — D649 Anemia, unspecified: Secondary | ICD-10-CM

## 2021-12-04 DIAGNOSIS — F32A Depression, unspecified: Secondary | ICD-10-CM

## 2021-12-04 DIAGNOSIS — I10 Essential (primary) hypertension: Secondary | ICD-10-CM | POA: Diagnosis not present

## 2021-12-04 DIAGNOSIS — J441 Chronic obstructive pulmonary disease with (acute) exacerbation: Secondary | ICD-10-CM

## 2021-12-04 DIAGNOSIS — E782 Mixed hyperlipidemia: Secondary | ICD-10-CM | POA: Diagnosis not present

## 2021-12-04 DIAGNOSIS — R7989 Other specified abnormal findings of blood chemistry: Secondary | ICD-10-CM

## 2021-12-04 LAB — COMPREHENSIVE METABOLIC PANEL
ALT: 10 U/L (ref 0–35)
AST: 13 U/L (ref 0–37)
Albumin: 4.1 g/dL (ref 3.5–5.2)
Alkaline Phosphatase: 51 U/L (ref 39–117)
BUN: 17 mg/dL (ref 6–23)
CO2: 27 mEq/L (ref 19–32)
Calcium: 9.5 mg/dL (ref 8.4–10.5)
Chloride: 100 mEq/L (ref 96–112)
Creatinine, Ser: 0.61 mg/dL (ref 0.40–1.20)
GFR: 88.79 mL/min (ref >60.00)
Glucose, Bld: 97 mg/dL (ref 70–99)
Potassium: 4.5 mEq/L (ref 3.5–5.1)
Sodium: 139 mEq/L (ref 135–145)
Total Bilirubin: 0.5 mg/dL (ref 0.2–1.2)
Total Protein: 6.5 g/dL (ref 6.0–8.3)

## 2021-12-04 LAB — CBC WITH DIFFERENTIAL/PLATELET
Basophils Absolute: 0.1 10*3/uL (ref 0.0–0.1)
Basophils Relative: 0.8 % (ref 0.0–3.0)
Eosinophils Absolute: 0.1 10*3/uL (ref 0.0–0.7)
Eosinophils Relative: 1.4 % (ref 0.0–5.0)
HCT: 45.3 % (ref 36.0–46.0)
Hemoglobin: 14.9 g/dL (ref 12.0–15.0)
Lymphocytes Relative: 18.9 % (ref 12.0–46.0)
Lymphs Abs: 1.7 10*3/uL (ref 0.7–4.0)
MCHC: 33 g/dL (ref 30.0–36.0)
MCV: 95.7 fl (ref 78.0–100.0)
Monocytes Absolute: 0.7 10*3/uL (ref 0.1–1.0)
Monocytes Relative: 8.4 % (ref 3.0–12.0)
Neutro Abs: 6.2 10*3/uL (ref 1.4–7.7)
Neutrophils Relative %: 70.5 % (ref 43.0–77.0)
Platelets: 237 10*3/uL (ref 150.0–400.0)
RBC: 4.73 Mil/uL (ref 3.87–5.11)
RDW: 13.3 % (ref 11.5–15.5)
WBC: 8.8 10*3/uL (ref 4.0–10.5)

## 2021-12-04 LAB — LIPID PANEL
Cholesterol: 173 mg/dL (ref 0–200)
HDL: 58.2 mg/dL (ref >39.00)
LDL Cholesterol: 84 mg/dL (ref 0–99)
NonHDL: 114.73
Total CHOL/HDL Ratio: 3
Triglycerides: 152 mg/dL — ABNORMAL HIGH (ref 0.0–149.0)
VLDL: 30.4 mg/dL (ref 0.0–40.0)

## 2021-12-04 LAB — TSH: TSH: 1.06 u[IU]/mL (ref 0.35–5.50)

## 2021-12-04 MED ORDER — DIAZEPAM 2 MG PO TABS: 1.0000 mg | ORAL_TABLET | Freq: Two times a day (BID) | ORAL | 0 refills | Status: DC | PRN

## 2021-12-04 NOTE — Assessment & Plan Note (Signed)
She is tearful today because her daughter has just been diagnosed with cancer, breast. She is having a procedure today. Patient declines daily medication but is allowed a few Diazepam to use prn.

## 2021-12-04 NOTE — Patient Instructions (Addendum)
Consider adding a nightly med such as Abilify or Zyprexa for worsening stress, depression trouble sleeping 60-80 ounces of water a day RSV (respiratory syncitial virus) vaccine at pharmacy, Arexvy  Covid booster when new version out late September Consider starting a fatty acid supplement such as Fish or Krill or Flaxseed oil caps Preventive Care 53 Years and Older, Female Preventive care refers to lifestyle choices and visits with your health care provider that can promote health and wellness. Preventive care visits are also called wellness exams. What can I expect for my preventive care visit? Counseling Your health care provider may ask you questions about your: Medical history, including: Past medical problems. Family medical history. Pregnancy and menstrual history. History of falls. Current health, including: Memory and ability to understand (cognition). Emotional well-being. Home life and relationship well-being. Sexual activity and sexual health. Lifestyle, including: Alcohol, nicotine or tobacco, and drug use. Access to firearms. Diet, exercise, and sleep habits. Work and work Statistician. Sunscreen use. Safety issues such as seatbelt and bike helmet use. Physical exam Your health care provider will check your: Height and weight. These may be used to calculate your BMI (body mass index). BMI is a measurement that tells if you are at a healthy weight. Waist circumference. This measures the distance around your waistline. This measurement also tells if you are at a healthy weight and may help predict your risk of certain diseases, such as type 2 diabetes and high blood pressure. Heart rate and blood pressure. Body temperature. Skin for abnormal spots. What immunizations do I need?  Vaccines are usually given at various ages, according to a schedule. Your health care provider will recommend vaccines for you based on your age, medical history, and lifestyle or other factors,  such as travel or where you work. What tests do I need? Screening Your health care provider may recommend screening tests for certain conditions. This may include: Lipid and cholesterol levels. Hepatitis C test. Hepatitis B test. HIV (human immunodeficiency virus) test. STI (sexually transmitted infection) testing, if you are at risk. Lung cancer screening. Colorectal cancer screening. Diabetes screening. This is done by checking your blood sugar (glucose) after you have not eaten for a while (fasting). Mammogram. Talk with your health care provider about how often you should have regular mammograms. BRCA-related cancer screening. This may be done if you have a family history of breast, ovarian, tubal, or peritoneal cancers. Bone density scan. This is done to screen for osteoporosis. Talk with your health care provider about your test results, treatment options, and if necessary, the need for more tests. Follow these instructions at home: Eating and drinking  Eat a diet that includes fresh fruits and vegetables, whole grains, lean protein, and low-fat dairy products. Limit your intake of foods with high amounts of sugar, saturated fats, and salt. Take vitamin and mineral supplements as recommended by your health care provider. Do not drink alcohol if your health care provider tells you not to drink. If you drink alcohol: Limit how much you have to 0-1 drink a day. Know how much alcohol is in your drink. In the [place] one drink equals one 12 oz bottle of beer (355 mL), one 5 oz glass of wine (148 mL), or one 1 oz glass of hard liquor (44 mL). Lifestyle Brush your teeth every morning and night with fluoride toothpaste. Floss one time each day. Exercise for at least 30 minutes 5 or more days each week. Do not use any products that contain nicotine or  tobacco. These products include cigarettes, chewing tobacco, and vaping devices, such as e-cigarettes. If you need help quitting, ask your  health care provider. Do not use drugs. If you are sexually active, practice safe sex. Use a condom or other form of protection in order to prevent STIs. Take aspirin only as told by your health care provider. Make sure that you understand how much to take and what form to take. Work with your health care provider to find out whether it is safe and beneficial for you to take aspirin daily. Ask your health care provider if you need to take a cholesterol-lowering medicine (statin). Find healthy ways to manage stress, such as: Meditation, yoga, or listening to music. Journaling. Talking to a trusted person. Spending time with friends and family. Minimize exposure to UV radiation to reduce your risk of skin cancer. Safety Always wear your seat belt while driving or riding in a vehicle. Do not drive: If you have been drinking alcohol. Do not ride with someone who has been drinking. When you are tired or distracted. While texting. If you have been using any mind-altering substances or drugs. Wear a helmet and other protective equipment during sports activities. If you have firearms in your house, make sure you follow all gun safety procedures. What's next? Visit your health care provider once a year for an annual wellness visit. Ask your health care provider how often you should have your eyes and teeth checked. Stay up to date on all vaccines. This information is not intended to replace advice given to you by your health care provider. Make sure you discuss any questions you have with your health care provider. Document Revised: 08/28/2020 Document Reviewed: 08/28/2020 Elsevier Patient Education  [place].

## 2021-12-04 NOTE — Telephone Encounter (Signed)
PA approved. Effective from 12/04/2021 through 12/05/2022.

## 2021-12-04 NOTE — Progress Notes (Signed)
Subjective:   By signing my name below, I, Savannah Stanley, attest that this documentation has been prepared under the direction and in the presence of Savannah Lukes, MD 12/04/2021.     Patient ID: Savannah Stanley, female    DOB: March 29, 1950, 71 y.o.   MRN: [id number]  Chief Complaint  Patient presents with   Annual Exam    Here for Cpe   HPI Patient is in today for a comprehensive physical exam and follow up on chronic medical conditions.   She denies having any fever, chills, ear pain, headaches, muscle pain, joint pain, new moles, rash, itching, congestion, sinus pain, sore throat, chest pain, palpitations, wheezing, nausea, vomitting, abdominal pain, diarrhea, constipation, blood in stool, dysuria, urgency, frequency and hematuria.  Anxiety/Depression/Sleep: She reports that she has been experiencing difficulty sleeping for several years. She is currently taking Citalopram 40 mg to manage her depression and is okay with being prescribed Diazepam to manage her anxiety.  Dental: She is up to date on dental care.   Dermatology: She does not currently see a dermatologist. She declines a dermatology referral today.  Family history: Her 43 year old daughter Savannah Stanley was recently diagnosed with stage 1B breast cancer. Her 58 year old son Savannah Stanley is healthy.   Immunizations: She has been informed about receiving COVID-19 and RSV immunizations. She is up to date on Flu, Pneumonia, Shingles, and Tetanus immunizations.   Iron: She reports that she discontinued her iron medication due to experiencing stomach issues. She further reports that she switched to a slow-releasing iron supplement that has been effective.   Past Medical History:  Diagnosis Date   Acute bronchitis with COPD ([place]) 03/19/2015   Bladder cancer ([place]) UROLOGIST-- DR Diona Fanti   HIGH-GRADE UROTHELIAL CARCINOMA   Bladder tumor "multiple"   "some ORs; some removed in office"   COPD (chronic obstructive pulmonary disease) ([place])     Depression    Diabetes mellitus type 2 in obese ([place]) 01/31/2015   GERD (gastroesophageal reflux disease)    H/O hiatal hernia    High cholesterol    History of blood transfusion 01/2013 - 02/2013   related to ORs   Hypertension    Iron deficiency anemia    Murmur, heart 05/20/2014   Pneumonia 07/2010; 08/07/2015   Type 2 diabetes mellitus [facility])    Past Surgical History:  Procedure Laterality Date   APPENDECTOMY  1980's   CYSTOSCOPY N/A 12/18/2013   Procedure: CYSTOSCOPY;  Surgeon: Jorja Loa, MD;  Location: [facility];  Service: Urology;  Laterality: N/A;   TRANSURETHRAL RESECTION OF BLADDER TUMOR N/A 02/16/2013   Procedure: TRANSURETHRAL RESECTION OF BLADDER TUMOR (TURBT);  Surgeon: Franchot Gallo, MD;  Location[facility];  Service: Urology;  Laterality: N/A;   TRANSURETHRAL RESECTION OF BLADDER TUMOR N/A 12/18/2013   Procedure: TRANSURETHRAL RESECTION OF BLADDER TUMOR (TURBT) ;  Surgeon: Jorja Loa, MD;  Location: [facility];  Service: Urology;  Laterality: N/A;   TRANSURETHRAL RESECTION OF BLADDER TUMOR WITH GYRUS (TURBT-GYRUS) N/A 01/23/2013   Procedure: TRANSURETHRAL RESECTION OF BLADDER TUMOR WITH GYRUS ;  Surgeon: Franchot Gallo, MD;  Location: [facility];  Service: Urology;  Laterality: N/A;   TUBAL LIGATION  1980's   Family History  Problem Relation Age of Onset   Cancer Father        prostate   Arthritis Brother    Frontotemporal dementia Brother    Dementia Brother    Cancer Daughter  breast   Social History   Socioeconomic History   Marital status: Widowed    Spouse name: Not on file   Number of children: Not on file   Years of education: Not on file   Highest education level: Not on file  Occupational History   Not on file  Tobacco Use   Smoking status: Former    Packs/day: 1.00    Years: 35.00    Total pack years: 35.00    Types: Cigarettes    Quit date: 07/15/2010    Years since  quitting: 11.3   Smokeless tobacco: Never  Substance and Sexual Activity   Alcohol use: Yes    Comment: wine socially    Drug use: No   Sexual activity: Not on file  Other Topics Concern   Not on file  Social History Narrative   Not on file   Social Determinants of Health   Financial Resource Strain: Low Risk  (10/21/2020)   Overall Financial Resource Strain (CARDIA)    Difficulty of Paying Living Expenses: Not hard at all  Food Insecurity: No Food Insecurity (10/21/2020)   Hunger Vital Sign    Worried About Running Out of Food in the Last Year: Never true    [place] in the Last Year: Never true  Transportation Needs: No Transportation Needs (10/21/2020)   PRAPARE - Hydrologist (Medical): No    Lack of Transportation (Non-Medical): No  Physical Activity: Inactive (10/21/2020)   Exercise Vital Sign    Days of Exercise per Week: 0 days    Minutes of Exercise per Session: 0 min  Stress: No Stress Concern Present (10/21/2020)   [place]    Feeling of Stress : Only a little  Social Connections: Moderately Isolated (10/21/2020)   Social Connection and Isolation Panel [NHANES]    Frequency of Communication with Friends and Family: More than three times a week    Frequency of Social Gatherings with Friends and Family: More than three times a week    Attends Religious Services: More than 4 times per year    Active Member of [employer] or Organizations: No    Attends Archivist Meetings: Never    Marital Status: Widowed  Intimate Partner Violence: Not At Risk (10/21/2020)   Humiliation, Afraid, Rape, and Kick questionnaire    Fear of Current or Ex-Partner: No    Emotionally Abused: No    Physically Abused: No    Sexually Abused: No   Outpatient Medications Prior to Visit  Medication Sig Dispense Refill   albuterol (VENTOLIN HFA) 108 (90 Base) MCG/ACT inhaler INHALE 2 PUFFS BY MOUTH  INTO THE LUNGS EVERY 6 HOURS AS NEEDED FOR WHEEZING AND SHORTNESS OF BREATH. GENERIC EQUIVALENT FOR PROAIR HFA 34 g 2   betamethasone valerate (VALISONE) 0.1 % cream Apply topically 2 (two) times daily. 15 g 0   Calcium Citrate-Vitamin D (CITRACAL + D PO) Take 2 tablets by mouth 2 (two) times daily.      citalopram (CELEXA) 40 MG tablet Take 1 tablet by mouth once daily 90 tablet 0   Coenzyme Q10 (COQ10) 200 MG CAPS Take 1 capsule by mouth daily.     EPINEPHrine 0.3 mg/0.3 mL IJ SOAJ injection Inject 0.3 mLs (0.3 mg total) into the muscle once. 1 Device 1   Ferrous Fumarate-Folic Acid (HEMOCYTE-F) 324-1 MG TABS Take 1 tablet by mouth daily. 30 tablet 3  fluticasone (FLONASE) 50 MCG/ACT nasal spray Place 2 sprays into both nostrils daily. 16 g 1   Fluticasone-Umeclidin-Vilant (TRELEGY ELLIPTA) 100-62.5-25 MCG/ACT AEPB Inhale 1 puff into the lungs daily. 1 each 11   guaiFENesin (MUCINEX) 600 MG 12 hr tablet Take 1 tablet (600 mg total) by mouth 2 (two) times daily. 20 tablet 0   losartan (COZAAR) 50 MG tablet TAKE 1 TABLET(50 MG TOTAL) BY MOUTH DAILY 90 tablet 1   MELATONIN PO Take 1 tablet by mouth at bedtime.     metFORMIN (GLUCOPHAGE) 500 MG tablet Take 2 tablets (1,000 mg total) by mouth daily. with food 270 tablet 1   metoprolol succinate (TOPROL-XL) 25 MG 24 hr tablet TAKE 1 TABLET(25 MG TOTAL) BY MOUTH DAILY 90 tablet 1   Multiple Vitamins-Minerals (ZINC PO) Take 1 tablet by mouth daily.     omeprazole (PRILOSEC) 20 MG capsule TAKE ONE CAPSULE BY MOUTH TWICE DAILY AS NEEDED 180 capsule 1   pravastatin (PRAVACHOL) 10 MG tablet TAKE 1 TABLET(10 MG TOTAL) BY MOUTH DAILY. 90 tablet 1   Probiotic Product (PROBIOTIC PO) Take 1 capsule by mouth daily.     Facility-Administered Medications Prior to Visit  Medication Dose Route Frequency Provider Last Rate Last Admin   mitoMYcin (MUTAMYCIN) chemo injection 40 mg  40 mg Bladder Instillation Once Franchot Gallo, MD       Allergies  Allergen  Reactions   Bee Venom Shortness Of Breath   Other Shortness Of Breath    MSG.   Latex Itching   Review of Systems  Constitutional:  Negative for chills and fever.  HENT:  Negative for congestion, ear pain, sinus pain and sore throat.   Respiratory:  Negative for cough, shortness of breath and wheezing.   Cardiovascular:  Negative for chest pain and palpitations.  Gastrointestinal:  Negative for abdominal pain, blood in stool, constipation, diarrhea, nausea and vomiting.  Genitourinary:  Negative for dysuria, frequency, hematuria and urgency.  Musculoskeletal:  Negative for joint pain and myalgias.  Skin:  Negative for itching and rash.       (-) New moles.  Neurological:  Negative for headaches.      Objective:    Physical Exam Constitutional:      General: She is not in acute distress.    Appearance: Normal appearance. She is not ill-appearing.  HENT:     Head: Normocephalic and atraumatic.     Right Ear: Tympanic membrane, ear canal and external ear normal.     Left Ear: Tympanic membrane, ear canal and external ear normal.     Mouth/Throat:     Mouth: Mucous membranes are moist.     Pharynx: Oropharynx is clear.  Eyes:     Extraocular Movements: Extraocular movements intact.     Right eye: No nystagmus.     Left eye: No nystagmus.     Pupils: Pupils are equal, round, and reactive to light.  Neck:     Vascular: No carotid bruit.  Cardiovascular:     Rate and Rhythm: Normal rate and regular rhythm.     Pulses: Normal pulses.     Heart sounds: Normal heart sounds. No murmur heard.    No gallop.  Pulmonary:     Effort: Pulmonary effort is normal. No respiratory distress.     Breath sounds: Normal breath sounds. No wheezing or rales.  Abdominal:     General: Bowel sounds are normal.     Tenderness: There is no abdominal tenderness.  Musculoskeletal:  Comments: Muscle strength 5/5 on upper and lower extremities.   Lymphadenopathy:     Cervical: No cervical  adenopathy.  Skin:    General: Skin is warm and dry.  Neurological:     Mental Status: She is alert and oriented to person, place, and time.     Sensory: Sensation is intact.     Motor: Motor function is intact.     Coordination: Coordination is intact.     Deep Tendon Reflexes:     Reflex Scores:      Patellar reflexes are 2+ on the right side and 2+ on the left side. Psychiatric:        Mood and Affect: Mood normal.        Behavior: Behavior normal.        Judgment: Judgment normal.    BP 128/62 (BP Location: Left Arm, Patient Position: Sitting, Cuff Size: Normal)   Pulse 82   Temp 98 F (36.7 C) (Oral)   Resp 16   Ht '5\' 3"'$  (1.6 m)   Wt 196 lb (88.9 kg)   SpO2 94%   BMI 34.72 kg/m  Wt Readings from Last 3 Encounters:  12/04/21 196 lb (88.9 kg)  10/13/21 196 lb (88.9 kg)  12/24/20 192 lb 9.6 oz (87.4 kg)   Diabetic Foot Exam - Simple   No data filed    Lab Results  Component Value Date   WBC 8.8 12/04/2021   HGB 14.9 12/04/2021   HCT 45.3 12/04/2021   PLT 237.0 12/04/2021   GLUCOSE 97 12/04/2021   CHOL 173 12/04/2021   TRIG 152.0 (H) 12/04/2021   HDL 58.20 12/04/2021   LDLCALC 84 12/04/2021   ALT 10 12/04/2021   AST 13 12/04/2021   NA 139 12/04/2021   K 4.5 12/04/2021   CL 100 12/04/2021   CREATININE 0.61 12/04/2021   BUN 17 12/04/2021   CO2 27 12/04/2021   TSH 1.06 12/04/2021   INR 1.04 01/18/2013   HGBA1C 7.3 (H) 04/24/2021   MICROALBUR <0.7 07/15/2015   Lab Results  Component Value Date   TSH 1.06 12/04/2021   Lab Results  Component Value Date   WBC 8.8 12/04/2021   HGB 14.9 12/04/2021   HCT 45.3 12/04/2021   MCV 95.7 12/04/2021   PLT 237.0 12/04/2021   Lab Results  Component Value Date   NA 139 12/04/2021   K 4.5 12/04/2021   CO2 27 12/04/2021   GLUCOSE 97 12/04/2021   BUN 17 12/04/2021   CREATININE 0.61 12/04/2021   BILITOT 0.5 12/04/2021   ALKPHOS 51 12/04/2021   AST 13 12/04/2021   ALT 10 12/04/2021   PROT 6.5 12/04/2021    ALBUMIN 4.1 12/04/2021   CALCIUM 9.5 12/04/2021   ANIONGAP 9 08/09/2015   GFR 88.79 12/04/2021   Lab Results  Component Value Date   CHOL 173 12/04/2021   Lab Results  Component Value Date   HDL 58.20 12/04/2021   Lab Results  Component Value Date   LDLCALC 84 12/04/2021   Lab Results  Component Value Date   TRIG 152.0 (H) 12/04/2021   Lab Results  Component Value Date   CHOLHDL 3 12/04/2021   Lab Results  Component Value Date   HGBA1C 7.3 (H) 04/24/2021   Colonoscopy: Patient declines to proceed.   Dexxa: Last completed on 09/10/2020. The BMD measured at Femur Neck Right is 0.900 g/cm2 with a T-score of -1.0. This patient is considered normal according to [facility]) criteria.  Repeat in 5 years.   Pap smear: Patient declines to proceed.   Mammogram: Patient declines to proceed.     Assessment & Plan:   Problem List Items Addressed This Visit     COPD (chronic obstructive pulmonary disease) ([place])    No recent exacerbation      Anemia - Primary   Relevant Orders   Iron, TIBC and Ferritin Panel   Depression    She is tearful today because her daughter has just been diagnosed with cancer, breast. She is having a procedure today. Patient declines daily medication but is allowed a few Diazepam to use prn.       Relevant Medications   diazepam (VALIUM) 2 MG tablet   Hypertension    Well controlled, no changes to meds. Encouraged heart healthy diet such as the DASH diet and exercise as tolerated.       Relevant Orders   Comprehensive metabolic panel (Completed)   TSH (Completed)   Hyperlipidemia    Encourage heart healthy diet such as MIND or DASH diet, increase exercise, avoid trans fats, simple carbohydrates and processed foods, consider a krill or fish or flaxseed oil cap daily. Tolerating Pravastatin      Relevant Orders   Lipid panel (Completed)   Preventative health care    Patient encouraged to maintain heart healthy diet,  regular exercise, adequate sleep. Consider daily probiotics. Take medications as prescribed. Labs ordered and reviewed. Dexa scan 08/2020 was normal repeat in 2027 MGM, declines to do them  Colonoscopy, declines to proceed Pap, stopped in 47s RSV (respiratory syncitial virus) vaccine at pharmacy Covid booster when new version out late September At pharmacy High dose flu shot mid Sept to mid Oct      Relevant Orders   Comprehensive metabolic panel (Completed)   Diabetes mellitus type 2 in obese (HCC)    hgba1c acceptable, minimize simple carbs. Increase exercise as tolerated. Continue current meds      Relevant Orders   Hemoglobin A1c   CBC with Differential/Platelet (Completed)   Low vitamin D level    Supplement and monitor      Meds ordered this encounter  Medications   diazepam (VALIUM) 2 MG tablet    Sig: Take 0.5-2 tablets (1-4 mg total) by mouth every 12 (twelve) hours as needed for anxiety.    Dispense:  10 tablet    Refill:  0   I, Penni Homans, MD, personally preformed the services described in this documentation.  All medical record entries made by the scribe were at my direction and in my presence.  I have reviewed the chart and discharge instructions (if applicable) and agree that the record reflects my personal performance and is accurate and complete. 12/04/2021  I,Mohammed Iqbal,acting as a scribe for Penni Homans, MD.,have documented all relevant documentation on the behalf of Penni Homans, MD,as directed by  Penni Homans, MD while in the presence of Penni Homans, MD.  Penni Homans, MD

## 2021-12-04 NOTE — Telephone Encounter (Signed)
PA initiated via Covermymeds; KEY: [id number]. Awaiting determination.

## 2021-12-05 LAB — IRON,TIBC AND FERRITIN PANEL
%SAT: 29 % (ref 16–45)
Ferritin: 25 ng/mL (ref 16–288)
Iron: 115 ug/dL (ref 45–160)
TIBC: 392 ug/dL (ref 250–450)

## 2021-12-08 LAB — HEMOGLOBIN A1C: Hgb A1c MFr Bld: 7.3 % — ABNORMAL HIGH (ref 4.6–6.5)

## 2021-12-24 ENCOUNTER — Other Ambulatory Visit: Payer: Self-pay

## 2022-02-09 ENCOUNTER — Other Ambulatory Visit: Payer: Self-pay | Admitting: Family Medicine

## 2022-02-09 ENCOUNTER — Telehealth: Payer: Self-pay | Admitting: Family Medicine

## 2022-02-09 MED ORDER — AMOXICILLIN 500 MG PO CAPS: 500.0000 mg | ORAL_CAPSULE | Freq: Three times a day (TID) | ORAL | 0 refills | Status: AC

## 2022-02-09 NOTE — Telephone Encounter (Signed)
Pt states she has come down with a sinus infection and is hoping pcp can call something in. She declined an appt as her daughter has cancer and cannot come in. Her sxs include drainage and congestion, she did not mention any wheezing but it did sound like there was some over hte phone. She also tested neg for covid on Thursday.

## 2022-02-09 NOTE — Telephone Encounter (Signed)
Pt was advised and stated understand

## 2022-02-15 ENCOUNTER — Other Ambulatory Visit: Payer: Self-pay | Admitting: Family Medicine

## 2022-02-15 DIAGNOSIS — F32A Depression, unspecified: Secondary | ICD-10-CM

## 2022-03-26 ENCOUNTER — Ambulatory Visit: Payer: Medicare Other | Admitting: Family Medicine

## 2022-03-27 ENCOUNTER — Other Ambulatory Visit: Payer: Self-pay | Admitting: Family Medicine

## 2022-03-27 DIAGNOSIS — E669 Obesity, unspecified: Secondary | ICD-10-CM

## 2022-03-27 DIAGNOSIS — J441 Chronic obstructive pulmonary disease with (acute) exacerbation: Secondary | ICD-10-CM

## 2022-04-02 ENCOUNTER — Other Ambulatory Visit: Payer: Self-pay

## 2022-04-02 ENCOUNTER — Telehealth: Payer: Self-pay | Admitting: Family Medicine

## 2022-04-02 MED ORDER — ALBUTEROL SULFATE HFA 108 (90 BASE) MCG/ACT IN AERS: INHALATION_SPRAY | RESPIRATORY_TRACT | 2 refills | Status: DC

## 2022-04-02 NOTE — Telephone Encounter (Signed)
Refill was sent

## 2022-04-02 NOTE — Telephone Encounter (Signed)
Prescription Request  04/02/2022  Is this a "Controlled Substance" medicine? No  LOV: 12/04/2021  What is the name of the medication or equipment?   albuterol (VENTOLIN HFA) 108 (90 Base) MCG/ACT inhaler  Have you contacted your pharmacy to request a refill? Yes   Which pharmacy would you like this sent to?  [facility] [place], [place] [address] Phone: [phone] Fax: [phone]    Patient notified that their request is being sent to the clinical staff for review and that they should receive a response within 2 business days.   Please advise at [facility] [phone]

## 2022-05-16 ENCOUNTER — Other Ambulatory Visit: Payer: Self-pay | Admitting: Family Medicine

## 2022-05-16 DIAGNOSIS — F32A Depression, unspecified: Secondary | ICD-10-CM

## 2022-05-18 ENCOUNTER — Other Ambulatory Visit: Payer: Self-pay | Admitting: Family Medicine

## 2022-05-18 DIAGNOSIS — I10 Essential (primary) hypertension: Secondary | ICD-10-CM

## 2022-06-08 ENCOUNTER — Encounter: Payer: Self-pay | Admitting: Family Medicine

## 2022-06-08 ENCOUNTER — Telehealth (INDEPENDENT_AMBULATORY_CARE_PROVIDER_SITE_OTHER): Payer: Medicare Other | Admitting: Family Medicine

## 2022-06-08 DIAGNOSIS — J0100 Acute maxillary sinusitis, unspecified: Secondary | ICD-10-CM

## 2022-06-08 MED ORDER — PROMETHAZINE-DM 6.25-15 MG/5ML PO SYRP: 5.0000 mL | ORAL_SOLUTION | Freq: Four times a day (QID) | ORAL | 0 refills | Status: DC | PRN

## 2022-06-08 MED ORDER — DOXYCYCLINE HYCLATE 100 MG PO TABS: 100.0000 mg | ORAL_TABLET | Freq: Two times a day (BID) | ORAL | 0 refills | Status: AC

## 2022-06-08 NOTE — Progress Notes (Signed)
CC: URI complaints  Savannah Stanley here for URI complaints. We are interacting via web portal for an electronic face-to-face visit. I verified patient's ID using 2 identifiers. Patient agreed to proceed with visit via this method. Patient is at home, I am at office. Patient and I are present for visit.   Duration: 4 days, getting worse as of yesterday Associated symptoms: sinus congestion, sinus pain, rhinorrhea, and coughing Denies: itchy watery eyes, ear pain, ear drainage, sore throat, wheezing, shortness of breath, myalgia, and fevers Treatment to date: Mucinex, Advil, melatonin, Unisom Sick contacts: No Covid test neg at home today.   Past Medical History:  Diagnosis Date   Acute bronchitis with COPD ([place]) 03/19/2015   Bladder cancer ([place]) UROLOGIST-- DR Diona Fanti   HIGH-GRADE UROTHELIAL CARCINOMA   Bladder tumor "multiple"   "some ORs; some removed in office"   COPD (chronic obstructive pulmonary disease) ([place])    Depression    Diabetes mellitus type 2 in obese ([place]) 01/31/2015   GERD (gastroesophageal reflux disease)    H/O hiatal hernia    High cholesterol    History of blood transfusion 01/2013 - 02/2013   related to ORs   Hypertension    Iron deficiency anemia    Murmur, heart 05/20/2014   Pneumonia 07/2010; 08/07/2015   Type 2 diabetes mellitus (HCC)     Objective No conversational dyspnea Age appropriate judgment and insight Nml affect and mood  Acute maxillary sinusitis, recurrence not specified - Plan: doxycycline (VIBRA-TABS) 100 MG tablet  INCS, cough syrup for nighttime use, warned about drowsiness. Tylenol prn. If no better in 2-3 d, will take 7 d of doxy. Continue to push fluids, practice good hand hygiene, cover mouth when coughing. F/u prn. If starting to experience fevers, shaking, or shortness of breath, seek immediate care. Pt voiced understanding and agreement to the plan.  [place], DO 06/08/22 11:31 AM

## 2022-06-14 ENCOUNTER — Other Ambulatory Visit: Payer: Self-pay | Admitting: Family Medicine

## 2022-07-08 NOTE — Assessment & Plan Note (Addendum)
No recent exacerbation, doing well on Trelegy

## 2022-07-08 NOTE — Assessment & Plan Note (Signed)
Encourage heart healthy diet such as MIND or DASH diet, increase exercise, avoid trans fats, simple carbohydrates and processed foods, consider a krill or fish or flaxseed oil cap daily. Tolerating Pravastatin 

## 2022-07-08 NOTE — Assessment & Plan Note (Signed)
Supplement and monitor 

## 2022-07-08 NOTE — Assessment & Plan Note (Signed)
hgba1c acceptable, minimize simple carbs. Increase exercise as tolerated. Continue current meds 

## 2022-07-08 NOTE — Assessment & Plan Note (Signed)
Well controlled, no changes to meds. Encouraged heart healthy diet such as the DASH diet and exercise as tolerated.  °

## 2022-07-09 ENCOUNTER — Ambulatory Visit (INDEPENDENT_AMBULATORY_CARE_PROVIDER_SITE_OTHER): Payer: Medicare Other | Admitting: Family Medicine

## 2022-07-09 VITALS — BP 132/74 | HR 88 | Temp 97.5°F | Resp 16 | Ht 63.0 in | Wt 189.2 lb

## 2022-07-09 DIAGNOSIS — J441 Chronic obstructive pulmonary disease with (acute) exacerbation: Secondary | ICD-10-CM

## 2022-07-09 DIAGNOSIS — E669 Obesity, unspecified: Secondary | ICD-10-CM

## 2022-07-09 DIAGNOSIS — I10 Essential (primary) hypertension: Secondary | ICD-10-CM

## 2022-07-09 DIAGNOSIS — G8929 Other chronic pain: Secondary | ICD-10-CM

## 2022-07-09 DIAGNOSIS — E782 Mixed hyperlipidemia: Secondary | ICD-10-CM | POA: Diagnosis not present

## 2022-07-09 DIAGNOSIS — M25569 Pain in unspecified knee: Secondary | ICD-10-CM

## 2022-07-09 DIAGNOSIS — E1169 Type 2 diabetes mellitus with other specified complication: Secondary | ICD-10-CM | POA: Diagnosis not present

## 2022-07-09 DIAGNOSIS — R7989 Other specified abnormal findings of blood chemistry: Secondary | ICD-10-CM

## 2022-07-09 LAB — COMPREHENSIVE METABOLIC PANEL
ALT: 10 U/L (ref 0–35)
AST: 14 U/L (ref 0–37)
Albumin: 4.4 g/dL (ref 3.5–5.2)
Alkaline Phosphatase: 47 U/L (ref 39–117)
BUN: 15 mg/dL (ref 6–23)
CO2: 26 mEq/L (ref 19–32)
Calcium: 9.5 mg/dL (ref 8.4–10.5)
Chloride: 102 mEq/L (ref 96–112)
Creatinine, Ser: 0.6 mg/dL (ref 0.40–1.20)
GFR: 88.77 mL/min (ref >60.00)
Glucose, Bld: 88 mg/dL (ref 70–99)
Potassium: 4.4 mEq/L (ref 3.5–5.1)
Sodium: 139 mEq/L (ref 135–145)
Total Bilirubin: 0.3 mg/dL (ref 0.2–1.2)
Total Protein: 6.8 g/dL (ref 6.0–8.3)

## 2022-07-09 LAB — CBC WITH DIFFERENTIAL/PLATELET
Basophils Absolute: 0.1 10*3/uL (ref 0.0–0.1)
Basophils Relative: 0.9 % (ref 0.0–3.0)
Eosinophils Absolute: 0.2 10*3/uL (ref 0.0–0.7)
Eosinophils Relative: 1.8 % (ref 0.0–5.0)
HCT: 46.1 % — ABNORMAL HIGH (ref 36.0–46.0)
Hemoglobin: 15.4 g/dL — ABNORMAL HIGH (ref 12.0–15.0)
Lymphocytes Relative: 18.6 % (ref 12.0–46.0)
Lymphs Abs: 1.9 10*3/uL (ref 0.7–4.0)
MCHC: 33.4 g/dL (ref 30.0–36.0)
MCV: 95.5 fl (ref 78.0–100.0)
Monocytes Absolute: 0.8 10*3/uL (ref 0.1–1.0)
Monocytes Relative: 7.7 % (ref 3.0–12.0)
Neutro Abs: 7.4 10*3/uL (ref 1.4–7.7)
Neutrophils Relative %: 71 % (ref 43.0–77.0)
Platelets: 259 10*3/uL (ref 150.0–400.0)
RBC: 4.83 Mil/uL (ref 3.87–5.11)
RDW: 13.2 % (ref 11.5–15.5)
WBC: 10.4 10*3/uL (ref 4.0–10.5)

## 2022-07-09 LAB — LIPID PANEL
Cholesterol: 171 mg/dL (ref 0–200)
HDL: 60.9 mg/dL (ref >39.00)
LDL Cholesterol: 73 mg/dL (ref 0–99)
NonHDL: 110.45
Total CHOL/HDL Ratio: 3
Triglycerides: 186 mg/dL — ABNORMAL HIGH (ref 0.0–149.0)
VLDL: 37.2 mg/dL (ref 0.0–40.0)

## 2022-07-09 LAB — HEMOGLOBIN A1C: Hgb A1c MFr Bld: 7.1 % — ABNORMAL HIGH (ref 4.6–6.5)

## 2022-07-09 LAB — TSH: TSH: 0.91 u[IU]/mL (ref 0.35–5.50)

## 2022-07-09 NOTE — Patient Instructions (Addendum)
Covid booster now and in the fall  Tylenol/Acetaminophen ES 500 mg tabs max of 2 tabs three times daily (6 tabs/24 hours) Advil/Motrin/Ibuprofen 200 mg stick to no more than 1 tab three x daily with food and lowest dose effective  Topical ice blue emu and lidocaine  Arthritis Arthritis is a term that is commonly used to refer to joint pain or joint disease. There are more than 100 types of arthritis. What are the causes? The most common cause of this condition is wear and tear of a joint. Other causes include: Gout. Inflammation of a joint. An infection of a joint. Sprains and other injuries near the joint. A reaction to medicines or drugs, or an allergic reaction. In some cases, the cause may not be known. What are the signs or symptoms? The main symptom of this condition is pain in the joint during movement. Other symptoms include: Redness, swelling, or stiffness at a joint. Warmth coming from the joint. Fever. Overall feeling of illness. How is this diagnosed? This condition may be diagnosed with a physical exam and tests, including: Blood tests. Urine tests. Imaging tests, such as X-rays, an MRI, or a CT scan. Sometimes, fluid is removed from a joint for testing. How is this treated? This condition may be treated with: Treatment of the cause, if it is known. Rest. Raising (elevating) the joint. Applying cold or hot packs to the joint. Medicines to improve symptoms and reduce inflammation. Injections of a steroid, such as cortisone, into the joint to help reduce pain and inflammation. Depending on the cause of your arthritis, you may need to make lifestyle changes to reduce stress on your joint. Changes may include: Exercising more. Losing weight. Follow these instructions at home: Medicines Take over-the-counter and prescription medicines only as told by your health care provider. Do not take aspirin to relieve pain if your health care provider thinks that gout may be  causing your pain. Activity Rest your joint if told by your health care provider. Rest is important when your disease is active and your joint feels painful, swollen, or stiff. Avoid activities that make the pain worse. Balance activity with rest. Exercise your joint regularly with range-of-motion exercises as told by your health care provider. Try doing low-impact exercise, such as: Swimming. Water aerobics. Biking. Walking. Managing pain, stiffness, and swelling     If directed, put ice on the affected joint. To do this: Put ice in a plastic bag. Place a towel between your skin and the bag. Leave the ice on for 20 minutes, 2-3 times a day. Remove the ice if your skin turns bright red. This is very important. If you cannot feel pain, heat, or cold, you have a greater risk of damage to the area. If your joint is swollen, raise (elevate) it above the level of your heart if directed by your health care provider. If your joint feels stiff in the morning, try taking a warm shower. If directed, apply heat to the affected area as often as told by your health care provider. Use the heat source that your health care provider recommends, such as a moist heat pack or a heating pad. To apply heat: Place a towel between your skin and the heat source. Leave the heat on for 20-30 minutes. Remove the heat if your skin turns bright red. This is especially important if you are unable to feel pain, heat, or cold. You have a greater risk of getting burned. General instructions Maintain a healthy weight.  Follow instructions from your health care provider for weight control. Do not use any products that contain nicotine or tobacco. These products include cigarettes, chewing tobacco, and vaping devices, such as e-cigarettes. If you need help quitting, ask your health care provider. Keep all follow-up visits. This is important. Where to find more information [employer]:  www.niams.http://www.myers.net/ Contact a health care provider if: The pain gets worse. You have a fever. Get help right away if: You develop severe joint pain, swelling, or redness. Many joints become painful and swollen. You develop severe back pain. You develop severe weakness in your leg. Summary Arthritis is a term that is commonly used to refer to joint pain or joint disease. There are more than 100 types of arthritis. The most common cause of this condition is wear and tear of a joint. Other causes include gout, inflammation or infection of the joint, sprains, or allergies. Symptoms of this condition include redness, swelling, or stiffness of the joint. Other symptoms include warmth, fever, or feeling ill. This condition is treated with rest, elevation, medicines, and applying cold or hot packs. Follow your health care provider's instructions about medicines, activity, exercises, and other home care treatments. This information is not intended to replace advice given to you by your health care provider. Make sure you discuss any questions you have with your health care provider. Document Revised: 12/10/2020 Document Reviewed: 12/10/2020 Elsevier Patient Education  2023 [employer].

## 2022-07-12 DIAGNOSIS — G8929 Other chronic pain: Secondary | ICD-10-CM | POA: Insufficient documentation

## 2022-07-12 NOTE — Progress Notes (Signed)
Subjective:    Patient ID: Savannah Stanley, female    DOB: 1951/03/04, 72 y.o.   MRN: [id number]  Chief Complaint  Patient presents with   Follow-up    Follow up     HPI Patient is in today for follow up on chronic medical concerns. No recent febrile illness or hospitalizations. Denies CP/palp/SOB/HA/congestion/fevers/GI or GU c/o. Taking meds as prescribed. She notes her breathing has been much better since starting Trelegy. Continues to try and stay active and eat a heart healthy diet. She notes her biggest problem is her knees which can limit her movement.   Past Medical History:  Diagnosis Date   Acute bronchitis with COPD (HCC) 03/19/2015   Bladder cancer (HCC) UROLOGIST-- DR Retta Diones   HIGH-GRADE UROTHELIAL CARCINOMA   Bladder tumor "multiple"   "some ORs; some removed in office"   COPD (chronic obstructive pulmonary disease) (HCC)    Depression    Diabetes mellitus type 2 in obese (HCC) 01/31/2015   GERD (gastroesophageal reflux disease)    H/O hiatal hernia    High cholesterol    History of blood transfusion 01/2013 - 02/2013   related to ORs   Hypertension    Iron deficiency anemia    Murmur, heart 05/20/2014   Pneumonia 07/2010; 08/07/2015   Type 2 diabetes mellitus [facility])     Past Surgical History:  Procedure Laterality Date   APPENDECTOMY  1980's   CYSTOSCOPY N/A 12/18/2013   Procedure: CYSTOSCOPY;  Surgeon: [name], MD;  Location: [facility];  Service: Urology;  Laterality: N/A;   TRANSURETHRAL RESECTION OF BLADDER TUMOR N/A 02/16/2013   Procedure: TRANSURETHRAL RESECTION OF BLADDER TUMOR (TURBT);  Surgeon: [name], MD;  Location[facility];  Service: Urology;  Laterality: N/A;   TRANSURETHRAL RESECTION OF BLADDER TUMOR N/A 12/18/2013   Procedure: TRANSURETHRAL RESECTION OF BLADDER TUMOR (TURBT) ;  Surgeon: [name], MD;  Location: [facility];  Service: Urology;  Laterality: N/A;    TRANSURETHRAL RESECTION OF BLADDER TUMOR WITH GYRUS (TURBT-GYRUS) N/A 01/23/2013   Procedure: TRANSURETHRAL RESECTION OF BLADDER TUMOR WITH GYRUS ;  Surgeon: [name], MD;  Location: [facility];  Service: Urology;  Laterality: N/A;   TUBAL LIGATION  1980's    Family History  Problem Relation Age of Onset   Cancer Father        prostate   Arthritis Brother    Frontotemporal dementia Brother    Dementia Brother    Cancer Daughter        breast    Social History   Socioeconomic History   Marital status: Widowed    Spouse name: Not on file   Number of children: Not on file   Years of education: Not on file   Highest education level: Not on file  Occupational History   Not on file  Tobacco Use   Smoking status: Former    Packs/day: 1.00    Years: 35.00    Additional pack years: 0.00    Total pack years: 35.00    Types: Cigarettes    Quit date: 07/15/2010    Years since quitting: 12.0   Smokeless tobacco: Never  Substance and Sexual Activity   Alcohol use: Yes    Comment: wine socially    Drug use: No   Sexual activity: Not on file  Other Topics Concern   Not on file  Social History Narrative   Not on file   Social Determinants of  Health   Financial Resource Strain: Low Risk  (10/21/2020)   Overall Financial Resource Strain (CARDIA)    Difficulty of Paying Living Expenses: Not hard at all  Food Insecurity: No Food Insecurity (10/21/2020)   Hunger Vital Sign    Worried About Running Out of Food in the Last Year: Never true    Ran Out of Food in the Last Year: Never true  Transportation Needs: No Transportation Needs (10/21/2020)   PRAPARE - Administrator, Civil Service (Medical): No    Lack of Transportation (Non-Medical): No  Physical Activity: Inactive (10/21/2020)   Exercise Vital Sign    Days of Exercise per Week: 0 days    Minutes of Exercise per Session: 0 min  Stress: No Stress Concern Present (10/21/2020)   [employer] of Occupational Health  - Occupational Stress Questionnaire    Feeling of Stress : Only a little  Social Connections: Moderately Isolated (10/21/2020)   Social Connection and Isolation Panel [NHANES]    Frequency of Communication with Friends and Family: More than three times a week    Frequency of Social Gatherings with Friends and Family: More than three times a week    Attends Religious Services: More than 4 times per year    Active Member of [employer] or Organizations: No    Attends Banker Meetings: Never    Marital Status: Widowed  Intimate Partner Violence: Not At Risk (10/21/2020)   Humiliation, Afraid, Rape, and Kick questionnaire    Fear of Current or Ex-Partner: No    Emotionally Abused: No    Physically Abused: No    Sexually Abused: No    Outpatient Medications Prior to Visit  Medication Sig Dispense Refill   albuterol (VENTOLIN HFA) 108 (90 Base) MCG/ACT inhaler INHALE 2 PUFFS BY MOUTH INTO THE LUNGS EVERY 6 HOURS AS NEEDED FOR WHEEZING AND SHORTNESS OF BREATH. GENERIC EQUIVALENT FOR PROAIR HFA 34 g 2   betamethasone valerate (VALISONE) 0.1 % cream Apply topically 2 (two) times daily. 15 g 0   Calcium Citrate-Vitamin D (CITRACAL + D PO) Take 2 tablets by mouth 2 (two) times daily.      citalopram (CELEXA) 40 MG tablet Take 1 tablet by mouth once daily 90 tablet 0   Coenzyme Q10 (COQ10) 200 MG CAPS Take 1 capsule by mouth daily.     diazepam (VALIUM) 2 MG tablet Take 0.5-2 tablets (1-4 mg total) by mouth every 12 (twelve) hours as needed for anxiety. 10 tablet 0   EPINEPHrine 0.3 mg/0.3 mL IJ SOAJ injection Inject 0.3 mLs (0.3 mg total) into the muscle once. 1 Device 1   Ferrous Fumarate-Folic Acid (HEMOCYTE-F) 324-1 MG TABS Take 1 tablet by mouth daily. 30 tablet 3   fluticasone (FLONASE) 50 MCG/ACT nasal spray Place 2 sprays into both nostrils daily. 16 g 1   Fluticasone-Umeclidin-Vilant (TRELEGY ELLIPTA) 100-62.5-25 MCG/ACT AEPB INHALE 1 PUFF BY MOUTH INTO THE LUNGS DAILY 180 each 1    guaiFENesin (MUCINEX) 600 MG 12 hr tablet Take 1 tablet (600 mg total) by mouth 2 (two) times daily. 20 tablet 0   losartan (COZAAR) 50 MG tablet TAKE 1 TABLET BY MOUTH DAILY GENERIC EQUIVALENT FOR COZAAR 90 tablet 1   MELATONIN PO Take 1 tablet by mouth at bedtime.     metFORMIN (GLUCOPHAGE) 500 MG tablet TAKE TWO TABLETS BY MOUTH IN THE MORNING AND ONE TABLET IN THE EVENING 270 tablet 1   metoprolol succinate (TOPROL-XL) 25 MG 24 hr tablet  TAKE 1 TABLET BY MOUTH DAILY GENERIC EQUIVALENT FOR TOPROL XL 90 tablet 1   Multiple Vitamins-Minerals (ZINC PO) Take 1 tablet by mouth daily.     omeprazole (PRILOSEC) 20 MG capsule TAKE ONE CAPSULE BY MOUTH TWICE DAILY AS NEEDED 180 capsule 1   pravastatin (PRAVACHOL) 10 MG tablet Take 1 tablet by mouth once daily 90 tablet 0   Probiotic Product (PROBIOTIC PO) Take 1 capsule by mouth daily.     promethazine-dextromethorphan (PROMETHAZINE-DM) 6.25-15 MG/5ML syrup Take 5 mLs by mouth 4 (four) times daily as needed for cough. 118 mL 0   Facility-Administered Medications Prior to Visit  Medication Dose Route Frequency Provider Last Rate Last Admin   mitoMYcin (MUTAMYCIN) chemo injection 40 mg  40 mg Bladder Instillation Once [name], MD        Allergies  Allergen Reactions   Bee Venom Shortness Of Breath   Other Shortness Of Breath    MSG.   Latex Itching    Review of Systems  Constitutional:  Negative for fever and malaise/fatigue.  HENT:  Negative for congestion.   Eyes:  Negative for blurred vision.  Respiratory:  Negative for shortness of breath.   Cardiovascular:  Negative for chest pain, palpitations and leg swelling.  Gastrointestinal:  Negative for abdominal pain, blood in stool and nausea.  Genitourinary:  Negative for dysuria and frequency.  Musculoskeletal:  Positive for joint pain. Negative for falls.  Skin:  Negative for rash.  Neurological:  Negative for dizziness, loss of consciousness and headaches.   Endo/Heme/Allergies:  Negative for environmental allergies.  Psychiatric/Behavioral:  Negative for depression. The patient is not nervous/anxious.        Objective:    Physical Exam Constitutional:      General: She is not in acute distress.    Appearance: Normal appearance. She is well-developed. She is not toxic-appearing.  HENT:     Head: Normocephalic and atraumatic.     Right Ear: External ear normal.     Left Ear: External ear normal.     Nose: Nose normal.  Eyes:     General:        Right eye: No discharge.        Left eye: No discharge.     Conjunctiva/sclera: Conjunctivae normal.  Neck:     Thyroid: No thyromegaly.  Cardiovascular:     Rate and Rhythm: Normal rate and regular rhythm.     Heart sounds: Normal heart sounds. No murmur heard. Pulmonary:     Effort: Pulmonary effort is normal. No respiratory distress.     Breath sounds: Normal breath sounds.  Abdominal:     General: Bowel sounds are normal.     Palpations: Abdomen is soft.     Tenderness: There is no abdominal tenderness. There is no guarding.  Musculoskeletal:        General: Normal range of motion.     Cervical back: Neck supple.  Lymphadenopathy:     Cervical: No cervical adenopathy.  Skin:    General: Skin is warm and dry.  Neurological:     Mental Status: She is alert and oriented to person, place, and time.  Psychiatric:        Mood and Affect: Mood normal.        Behavior: Behavior normal.        Thought Content: Thought content normal.        Judgment: Judgment normal.     BP 132/74 (BP Location: Right Arm, Patient Position:  Sitting, Cuff Size: Normal)   Pulse 88   Temp (!) 97.5 F (36.4 C) (Oral)   Resp 16   Ht 5\' 3"  (1.6 m)   Wt 189 lb 3.2 oz (85.8 kg)   SpO2 96%   BMI 33.52 kg/m  Wt Readings from Last 3 Encounters:  07/09/22 189 lb 3.2 oz (85.8 kg)  12/04/21 196 lb (88.9 kg)  10/13/21 196 lb (88.9 kg)    Diabetic Foot Exam - Simple   No data filed    Lab Results   Component Value Date   WBC 10.4 07/09/2022   HGB 15.4 (H) 07/09/2022   HCT 46.1 (H) 07/09/2022   PLT 259.0 07/09/2022   GLUCOSE 88 07/09/2022   CHOL 171 07/09/2022   TRIG 186.0 (H) 07/09/2022   HDL 60.90 07/09/2022   LDLCALC 73 07/09/2022   ALT 10 07/09/2022   AST 14 07/09/2022   NA 139 07/09/2022   K 4.4 07/09/2022   CL 102 07/09/2022   CREATININE 0.60 07/09/2022   BUN 15 07/09/2022   CO2 26 07/09/2022   TSH 0.91 07/09/2022   INR 1.04 01/18/2013   HGBA1C 7.1 (H) 07/09/2022   MICROALBUR <0.7 07/15/2015    Lab Results  Component Value Date   TSH 0.91 07/09/2022   Lab Results  Component Value Date   WBC 10.4 07/09/2022   HGB 15.4 (H) 07/09/2022   HCT 46.1 (H) 07/09/2022   MCV 95.5 07/09/2022   PLT 259.0 07/09/2022   Lab Results  Component Value Date   NA 139 07/09/2022   K 4.4 07/09/2022   CO2 26 07/09/2022   GLUCOSE 88 07/09/2022   BUN 15 07/09/2022   CREATININE 0.60 07/09/2022   BILITOT 0.3 07/09/2022   ALKPHOS 47 07/09/2022   AST 14 07/09/2022   ALT 10 07/09/2022   PROT 6.8 07/09/2022   ALBUMIN 4.4 07/09/2022   CALCIUM 9.5 07/09/2022   ANIONGAP 9 08/09/2015   GFR 88.77 07/09/2022   Lab Results  Component Value Date   CHOL 171 07/09/2022   Lab Results  Component Value Date   HDL 60.90 07/09/2022   Lab Results  Component Value Date   LDLCALC 73 07/09/2022   Lab Results  Component Value Date   TRIG 186.0 (H) 07/09/2022   Lab Results  Component Value Date   CHOLHDL 3 07/09/2022   Lab Results  Component Value Date   HGBA1C 7.1 (H) 07/09/2022       Assessment & Plan:  Type 2 diabetes mellitus with obesity (HCC) Assessment & Plan: hgba1c acceptable, minimize simple carbs. Increase exercise as tolerated. Continue current meds  Orders: -     Hemoglobin A1c  Mixed hyperlipidemia Assessment & Plan: Encourage heart healthy diet such as MIND or DASH diet, increase exercise, avoid trans fats, simple carbohydrates and processed foods,  consider a krill or fish or flaxseed oil cap daily. Tolerating Pravastatin  Orders: -     Lipid panel  Primary hypertension Assessment & Plan: Well controlled, no changes to meds. Encouraged heart healthy diet such as the DASH diet and exercise as tolerated.   Orders: -     CBC with Differential/Platelet -     Comprehensive metabolic panel -     TSH  Low vitamin D level Assessment & Plan: Supplement and monitor    Chronic obstructive pulmonary disease with acute exacerbation (HCC) Assessment & Plan: No recent exacerbation, doing well on Trelegy   Chronic knee pain, unspecified laterality Assessment & Plan: Struggles with  pain and stiffness in knees but tries not to let it limit her ADLs. Encouraged moist heat and gentle stretching as tolerated. May try Tylenol and prescription meds as directed and report if symptoms worsen or seek immediate care      Danise Edge, MD

## 2022-07-12 NOTE — Assessment & Plan Note (Signed)
Struggles with pain and stiffness in knees but tries not to let it limit her ADLs. Encouraged moist heat and gentle stretching as tolerated. May try Tylenol and prescription meds as directed and report if symptoms worsen or seek immediate care

## 2022-07-22 ENCOUNTER — Other Ambulatory Visit: Payer: Self-pay | Admitting: Family Medicine

## 2022-08-17 ENCOUNTER — Other Ambulatory Visit: Payer: Self-pay | Admitting: Family Medicine

## 2022-08-17 DIAGNOSIS — F32A Depression, unspecified: Secondary | ICD-10-CM

## 2022-09-27 ENCOUNTER — Other Ambulatory Visit: Payer: Self-pay | Admitting: Family Medicine

## 2022-10-06 ENCOUNTER — Other Ambulatory Visit: Payer: Self-pay | Admitting: Family Medicine

## 2022-10-06 DIAGNOSIS — J441 Chronic obstructive pulmonary disease with (acute) exacerbation: Secondary | ICD-10-CM

## 2022-10-08 DIAGNOSIS — Z8551 Personal history of malignant neoplasm of bladder: Secondary | ICD-10-CM | POA: Diagnosis not present

## 2022-10-12 ENCOUNTER — Other Ambulatory Visit: Payer: Self-pay

## 2022-10-14 ENCOUNTER — Encounter (INDEPENDENT_AMBULATORY_CARE_PROVIDER_SITE_OTHER): Payer: Self-pay

## 2022-10-19 ENCOUNTER — Other Ambulatory Visit: Payer: Self-pay | Admitting: Family Medicine

## 2022-10-19 DIAGNOSIS — I10 Essential (primary) hypertension: Secondary | ICD-10-CM

## 2022-11-04 ENCOUNTER — Other Ambulatory Visit: Payer: Self-pay | Admitting: Family Medicine

## 2022-11-04 DIAGNOSIS — F32A Depression, unspecified: Secondary | ICD-10-CM

## 2022-11-24 ENCOUNTER — Other Ambulatory Visit: Payer: Self-pay | Admitting: Family Medicine

## 2022-11-24 DIAGNOSIS — I10 Essential (primary) hypertension: Secondary | ICD-10-CM

## 2022-12-08 ENCOUNTER — Telehealth (INDEPENDENT_AMBULATORY_CARE_PROVIDER_SITE_OTHER): Payer: Medicare Other | Admitting: Family

## 2022-12-08 ENCOUNTER — Encounter: Payer: Self-pay | Admitting: Family

## 2022-12-08 VITALS — BP 137/78 | Ht 63.0 in

## 2022-12-08 DIAGNOSIS — J0100 Acute maxillary sinusitis, unspecified: Secondary | ICD-10-CM

## 2022-12-08 MED ORDER — DOXYCYCLINE HYCLATE 100 MG PO TABS: 100.0000 mg | ORAL_TABLET | Freq: Two times a day (BID) | ORAL | 0 refills | Status: DC

## 2022-12-08 MED ORDER — PROMETHAZINE-DM 6.25-15 MG/5ML PO SYRP: 5.0000 mL | ORAL_SOLUTION | Freq: Four times a day (QID) | ORAL | 0 refills | Status: DC | PRN

## 2022-12-08 NOTE — Progress Notes (Signed)
Savannah Stanley is a 72 y.o. female with the following history as recorded in Epic[employer]:  Patient Active Problem List   Diagnosis Date Noted   Knee pain, chronic 07/12/2022   Bite, insect 10/13/2021   Low vitamin D level 12/25/2020   Nonspecific abnormal electrocardiogram (ECG) (EKG) 08/07/2015   Murmur, heart 05/20/2014   Preventative health care 11/17/2013   Malignant neoplasm of bladder (HCC) 02/16/2013   Urethral carcinoma (HCC) 01/19/2013   Hypokalemia 01/18/2013   Weakness 01/18/2013   Tachycardia 01/18/2013   Esophageal reflux 08/25/2012   Hyperlipidemia 02/17/2012   Hypertension 09/22/2010   COPD (chronic obstructive pulmonary disease) (HCC) 08/17/2010   Anemia 08/17/2010   Depression 08/17/2010   Insomnia 08/17/2010    Current Outpatient Medications  Medication Sig Dispense Refill   albuterol (VENTOLIN HFA) 108 (90 Base) MCG/ACT inhaler INHALE 2 PUFFS BY MOUTH INTO THE LUNGS EVERY 6 HOURS AS NEEDED FOR WHEEZING AND SHORTNESS OF BREATH. GENERIC EQUIVALENT FOR PROAIR HFA 34 g 2   betamethasone valerate (VALISONE) 0.1 % cream Apply topically 2 (two) times daily. 15 g 0   Calcium Citrate-Vitamin D (CITRACAL + D PO) Take 2 tablets by mouth 2 (two) times daily.      citalopram (CELEXA) 40 MG tablet Take 1 tablet (40 mg total) by mouth daily. Due for visit 12/2022 90 tablet 0   Coenzyme Q10 (COQ10) 200 MG CAPS Take 1 capsule by mouth daily.     diazepam (VALIUM) 2 MG tablet Take 0.5-2 tablets (1-4 mg total) by mouth every 12 (twelve) hours as needed for anxiety. 10 tablet 0   EPINEPHrine 0.3 mg/0.3 mL IJ SOAJ injection Inject 0.3 mLs (0.3 mg total) into the muscle once. 1 Device 1   Ferrous Fumarate-Folic Acid (HEMOCYTE-F) 324-1 MG TABS Take 1 tablet by mouth daily. 30 tablet 3   fluticasone (FLONASE) 50 MCG/ACT nasal spray Place 2 sprays into both nostrils daily. 16 g 1   guaiFENesin (MUCINEX) 600 MG 12 hr tablet Take 1 tablet (600 mg total) by mouth 2 (two) times daily. 20 tablet  0   losartan (COZAAR) 50 MG tablet TAKE 1 TABLET BY MOUTH DAILY GENERIC EQUIVALENT FOR COZAAR 90 tablet 1   MELATONIN PO Take 1 tablet by mouth at bedtime.     metFORMIN (GLUCOPHAGE) 500 MG tablet TAKE TWO TABLETS BY MOUTH IN THE MORNING AND ONE TABLET IN THE EVENING 270 tablet 1   metoprolol succinate (TOPROL-XL) 25 MG 24 hr tablet TAKE 1 TABLET BY MOUTH DAILY GENERIC EQUIVALENT FOR TOPROL XL 90 tablet 1   Multiple Vitamins-Minerals (ZINC PO) Take 1 tablet by mouth daily.     omeprazole (PRILOSEC) 20 MG capsule Take 1 capsule (20 mg total) by mouth 2 (two) times daily as needed. 180 capsule 1   pravastatin (PRAVACHOL) 10 MG tablet Take 1 tablet by mouth once daily 90 tablet 0   Probiotic Product (PROBIOTIC PO) Take 1 capsule by mouth daily.     promethazine-dextromethorphan (PROMETHAZINE-DM) 6.25-15 MG/5ML syrup Take 5 mLs by mouth 4 (four) times daily as needed for cough. 118 mL 0   TRELEGY ELLIPTA 100-62.5-25 MCG/ACT AEPB INHALE 1 PUFF BY MOUTH INTO THE LUNGS DAILY 180 each 1   No current facility-administered medications for this visit.   Facility-Administered Medications Ordered in Other Visits  Medication Dose Route Frequency Provider Last Rate Last Admin   mitoMYcin (MUTAMYCIN) chemo injection 40 mg  40 mg Bladder Instillation Once [name], MD  Allergies: Bee venom, Other, and Latex  Past Medical History:  Diagnosis Date   Acute bronchitis with COPD (HCC) 03/19/2015   Bladder cancer (HCC) UROLOGIST-- DR Retta Diones   HIGH-GRADE UROTHELIAL CARCINOMA   Bladder tumor "multiple"   "some ORs; some removed in office"   COPD (chronic obstructive pulmonary disease) (HCC)    Depression    Diabetes mellitus type 2 in obese 01/31/2015   GERD (gastroesophageal reflux disease)    H/O hiatal hernia    High cholesterol    History of blood transfusion 01/2013 - 02/2013   related to ORs   Hypertension    Iron deficiency anemia    Murmur, heart 05/20/2014   Pneumonia 07/2010;  08/07/2015   Type 2 diabetes mellitus [facility])     Past Surgical History:  Procedure Laterality Date   APPENDECTOMY  1980's   CYSTOSCOPY N/A 12/18/2013   Procedure: CYSTOSCOPY;  Surgeon: [name], MD;  Location: [facility];  Service: Urology;  Laterality: N/A;   TRANSURETHRAL RESECTION OF BLADDER TUMOR N/A 02/16/2013   Procedure: TRANSURETHRAL RESECTION OF BLADDER TUMOR (TURBT);  Surgeon: [name], MD;  Location: [facility];  Service: Urology;  Laterality: N/A;   TRANSURETHRAL RESECTION OF BLADDER TUMOR N/A 12/18/2013   Procedure: TRANSURETHRAL RESECTION OF BLADDER TUMOR (TURBT) ;  Surgeon: [name], MD;  Location: [facility];  Service: Urology;  Laterality: N/A;   TRANSURETHRAL RESECTION OF BLADDER TUMOR WITH GYRUS (TURBT-GYRUS) N/A 01/23/2013   Procedure: TRANSURETHRAL RESECTION OF BLADDER TUMOR WITH GYRUS ;  Surgeon: [name], MD;  Location: [facility];  Service: Urology;  Laterality: N/A;   TUBAL LIGATION  1980's    Family History  Problem Relation Age of Onset   Cancer Father        prostate   Arthritis Brother    Frontotemporal dementia Brother    Dementia Brother    Cancer Daughter        breast    Social History   Tobacco Use   Smoking status: Former    Current packs/day: 0.00    Average packs/day: 1 pack/day for 35.0 years (35.0 ttl pk-yrs)    Types: Cigarettes    Start date: 07/15/1975    Quit date: 07/15/2010    Years since quitting: 12.4   Smokeless tobacco: Never  Substance Use Topics   Alcohol use: Yes    Comment: wine socially     Subjective:    I connected with Savannah Stanley on 12/08/22 at  9:00 AM EDT by a video enabled telemedicine application and verified that I am speaking with the correct person using two identifiers.   I discussed the limitations of evaluation and management by telemedicine and the availability of in person appointments. The patient expressed understanding and  agreed to proceed. Provider in office/ patient is at home; provider and patient are only 2 people on video call.   Concerned for sinus infection; "dripping down back of my throat." Feels just like what I had last spring; no fever, chest pain or shortness of breath; did well on cough syrup given in March and would like refill if possible;   Objective:  Vitals:   12/08/22 0900  BP: 137/78  Height: 5\' 3"  (1.6 m)    General: Well developed, well nourished, in no acute distress  Skin : Warm and dry.  Head: Normocephalic and atraumatic  Lungs: Respirations unlabored;  Neurologic: Alert and oriented; speech intact; face symmetrical; moves all extremities  well; CNII-XII intact without focal deficit   Assessment:  1. Acute maxillary sinusitis, recurrence not specified     Plan:  Rx for Doxycycline and Promethazine DM cough syrup- has done well on this combination in the past; increase fluids,rest and follow up worse, no better; will need in person visit if symptoms persist.   No follow-ups on file.  No orders of the defined types were placed in this encounter.   Requested Prescriptions    No prescriptions requested or ordered in this encounter

## 2022-12-15 ENCOUNTER — Telehealth: Payer: Self-pay | Admitting: Family Medicine

## 2022-12-15 NOTE — Telephone Encounter (Signed)
Copied from CRM [id number]. Topic: Medicare AWV >> Dec 15, 2022  1:45 PM Payton Doughty wrote: Reason for CRM: Called LM 12/15/2022 to schedule AWV   Verlee Rossetti; Care Guide Ambulatory Clinical Support [place] [facility] Direct Dial: [phone]

## 2022-12-23 ENCOUNTER — Other Ambulatory Visit: Payer: Self-pay | Admitting: Family Medicine

## 2022-12-23 DIAGNOSIS — E1169 Type 2 diabetes mellitus with other specified complication: Secondary | ICD-10-CM

## 2022-12-28 ENCOUNTER — Ambulatory Visit: Payer: Medicare Other | Admitting: Family Medicine

## 2022-12-28 ENCOUNTER — Encounter: Payer: Self-pay | Admitting: Family Medicine

## 2022-12-28 VITALS — BP 123/69 | HR 83 | Ht 63.0 in | Wt 185.0 lb

## 2022-12-28 DIAGNOSIS — E119 Type 2 diabetes mellitus without complications: Secondary | ICD-10-CM | POA: Diagnosis not present

## 2022-12-28 DIAGNOSIS — I10 Essential (primary) hypertension: Secondary | ICD-10-CM | POA: Diagnosis not present

## 2022-12-28 DIAGNOSIS — E559 Vitamin D deficiency, unspecified: Secondary | ICD-10-CM | POA: Diagnosis not present

## 2022-12-28 DIAGNOSIS — R7989 Other specified abnormal findings of blood chemistry: Secondary | ICD-10-CM

## 2022-12-28 DIAGNOSIS — E782 Mixed hyperlipidemia: Secondary | ICD-10-CM | POA: Diagnosis not present

## 2022-12-28 DIAGNOSIS — Z7984 Long term (current) use of oral hypoglycemic drugs: Secondary | ICD-10-CM

## 2022-12-28 DIAGNOSIS — J441 Chronic obstructive pulmonary disease with (acute) exacerbation: Secondary | ICD-10-CM

## 2022-12-28 DIAGNOSIS — F32A Depression, unspecified: Secondary | ICD-10-CM

## 2022-12-28 LAB — CBC WITH DIFFERENTIAL/PLATELET
Basophils Absolute: 0.1 10*3/uL (ref 0.0–0.1)
Basophils Relative: 1.1 % (ref 0.0–3.0)
Eosinophils Absolute: 0.1 10*3/uL (ref 0.0–0.7)
Eosinophils Relative: 1 % (ref 0.0–5.0)
HCT: 47.7 % — ABNORMAL HIGH (ref 36.0–46.0)
Hemoglobin: 15.2 g/dL — ABNORMAL HIGH (ref 12.0–15.0)
Lymphocytes Relative: 22.6 % (ref 12.0–46.0)
Lymphs Abs: 1.9 10*3/uL (ref 0.7–4.0)
MCHC: 32 g/dL (ref 30.0–36.0)
MCV: 97.3 fl (ref 78.0–100.0)
Monocytes Absolute: 0.7 10*3/uL (ref 0.1–1.0)
Monocytes Relative: 8.1 % (ref 3.0–12.0)
Neutro Abs: 5.6 10*3/uL (ref 1.4–7.7)
Neutrophils Relative %: 67.2 % (ref 43.0–77.0)
Platelets: 242 10*3/uL (ref 150.0–400.0)
RBC: 4.9 Mil/uL (ref 3.87–5.11)
RDW: 13.2 % (ref 11.5–15.5)
WBC: 8.4 10*3/uL (ref 4.0–10.5)

## 2022-12-28 LAB — TSH: TSH: 1.16 u[IU]/mL (ref 0.35–5.50)

## 2022-12-28 LAB — COMPREHENSIVE METABOLIC PANEL WITH GFR
ALT: 11 U/L (ref 0–35)
AST: 16 U/L (ref 0–37)
Albumin: 4.2 g/dL (ref 3.5–5.2)
Alkaline Phosphatase: 53 U/L (ref 39–117)
BUN: 15 mg/dL (ref 6–23)
CO2: 25 meq/L (ref 19–32)
Calcium: 9.5 mg/dL (ref 8.4–10.5)
Chloride: 100 meq/L (ref 96–112)
Creatinine, Ser: 0.58 mg/dL (ref 0.40–1.20)
GFR: 89.21 mL/min
Glucose, Bld: 107 mg/dL — ABNORMAL HIGH (ref 70–99)
Potassium: 4.6 meq/L (ref 3.5–5.1)
Sodium: 136 meq/L (ref 135–145)
Total Bilirubin: 0.4 mg/dL (ref 0.2–1.2)
Total Protein: 6.5 g/dL (ref 6.0–8.3)

## 2022-12-28 LAB — LIPID PANEL
Cholesterol: 183 mg/dL (ref 0–200)
HDL: 58.1 mg/dL
LDL Cholesterol: 101 mg/dL — ABNORMAL HIGH (ref 0–99)
NonHDL: 124.94
Total CHOL/HDL Ratio: 3
Triglycerides: 119 mg/dL (ref 0.0–149.0)
VLDL: 23.8 mg/dL (ref 0.0–40.0)

## 2022-12-28 LAB — HEMOGLOBIN A1C: Hgb A1c MFr Bld: 7.1 % — ABNORMAL HIGH (ref 4.6–6.5)

## 2022-12-28 LAB — VITAMIN D 25 HYDROXY (VIT D DEFICIENCY, FRACTURES): VITD: 52.78 ng/mL (ref 30.00–100.00)

## 2022-12-28 MED ORDER — PRAVASTATIN SODIUM 10 MG PO TABS
10.0000 mg | ORAL_TABLET | Freq: Every day | ORAL | 0 refills | Status: DC
Start: 2022-12-28 — End: 2023-01-20

## 2022-12-28 MED ORDER — TRELEGY ELLIPTA 100-62.5-25 MCG/ACT IN AEPB: 1.0000 | INHALATION_SPRAY | Freq: Every day | RESPIRATORY_TRACT | 1 refills | Status: DC

## 2022-12-28 NOTE — Progress Notes (Signed)
Established Patient Office Visit  Subjective   Patient ID: Savannah Stanley, female    DOB: May 02, 1950  Age: 72 y.o. MRN: [id number]  Chief Complaint  Patient presents with   Medical Management of Chronic Issues    HPI   Discussed the use of AI scribe software for clinical note transcription with the patient, who gave verbal consent to proceed.  History of Present Illness   The patient, with a history of diabetes, hypertension, and chronic obstructive pulmonary disease (COPD), presents with a recent change in bowel habits. They report that after taking metformin 2 tabs BID, they started experiencing diarrhea. To manage this, they reduced the dose of metformin to 1 tab BID, which appears to have resolved the issue. They deny any symptoms of hyperglycemia or hypoglycemia.  In addition to diabetes, the patient has been managing their hypertension and COPD with her regular medications. They also report taking omeprazole for GERD. They have been on citalopram for the past twelve years, initially started after the death of their spouse. They report occasional sadness due to ongoing stressors but deny feelings of depression or hopelessness.  The patient also reports chronic edema in their feet, which improves with elevation. They deny any other new or worsening symptoms. They maintain an active lifestyle, with regular walking, and try to adhere to a diet low in carbohydrates.            ROS All review of systems negative except what is listed in the HPI    Objective:     BP 123/69   Pulse 83   Ht 5\' 3"  (1.6 m)   Wt 185 lb (83.9 kg)   SpO2 95%   BMI 32.77 kg/m    Physical Exam Vitals reviewed.  Constitutional:      Appearance: Normal appearance.  Cardiovascular:     Rate and Rhythm: Normal rate and regular rhythm.     Heart sounds: Normal heart sounds.  Pulmonary:     Effort: Pulmonary effort is normal.     Breath sounds: Normal breath sounds.  Musculoskeletal:      Comments: BLE non-pitting  Skin:    General: Skin is warm and dry.  Neurological:     Mental Status: She is alert and oriented to person, place, and time.  Psychiatric:        Mood and Affect: Mood normal.        Behavior: Behavior normal.        Thought Content: Thought content normal.        Judgment: Judgment normal.        No results found for any visits on 12/28/22.    The 10-year ASCVD risk score (Arnett DK, et al., 2019) is: 30.8%    Assessment & Plan:   Problem List Items Addressed This Visit       Active Problems   COPD (chronic obstructive pulmonary disease) (HCC)    Patient reports doing well on Trelegy. -Refill provided      Relevant Medications   Fluticasone-Umeclidin-Vilant (TRELEGY ELLIPTA) 100-62.5-25 MCG/ACT AEPB   Depression    Patient has been on Celexa for 12 years following the death of her husband. She reports stable mood and no feelings of depression or hopelessness. -Continue Celexa at current dose. -States she may want to consider tapering off in the future.      Hypertension    Patient reports home blood pressure readings have been normal. Office blood pressure was 123/69. -Continue current antihypertensive regimen. -  Encourage patient to continue monitoring blood pressure at home. -Continue healthy lifestyle choices.      Relevant Medications   pravastatin (PRAVACHOL) 10 MG tablet   Other Relevant Orders   CBC with Differential/Platelet   Comprehensive metabolic panel   TSH   Lipid panel   Hyperlipidemia - Primary    Patient needs refill of Pravastatin. -Continue healthy lifestyle      Relevant Medications   pravastatin (PRAVACHOL) 10 MG tablet   Other Relevant Orders   Comprehensive metabolic panel   Lipid panel   Low vitamin D level    Continue OTC supplemenation Labs today       Type 2 diabetes mellitus without complication, without long-term current use of insulin (HCC)    Patient reports diarrhea with Metformin 2  tablets BID. Reduced to 1 tablet in the morning and 1 at night with resolution of diarrhea. Last A1c in April was 7.1. -Check current A1c to assess control. -Consider medication change depending on A1c results.      Relevant Medications   pravastatin (PRAVACHOL) 10 MG tablet   Other Relevant Orders   Comprehensive metabolic panel   Hemoglobin A1c   Other Visit Diagnoses     Vitamin D deficiency       Relevant Orders   VITAMIN D 25 Hydroxy (Vit-D Deficiency, Fractures)       Return for routine follow-up in January as already scheduled .    Clayborne Dana, NP

## 2022-12-28 NOTE — Assessment & Plan Note (Signed)
Patient reports home blood pressure readings have been normal. Office blood pressure was 123/69. -Continue current antihypertensive regimen. -Encourage patient to continue monitoring blood pressure at home. -Continue healthy lifestyle choices.

## 2022-12-28 NOTE — Assessment & Plan Note (Signed)
Continue OTC supplemenation Labs today

## 2022-12-28 NOTE — Assessment & Plan Note (Signed)
Patient needs refill of Pravastatin. -Continue healthy lifestyle

## 2022-12-28 NOTE — Assessment & Plan Note (Signed)
Patient has been on Celexa for 12 years following the death of her husband. She reports stable mood and no feelings of depression or hopelessness. -Continue Celexa at current dose. -States she may want to consider tapering off in the future.

## 2022-12-28 NOTE — Assessment & Plan Note (Signed)
Patient reports diarrhea with Metformin 2 tablets BID. Reduced to 1 tablet in the morning and 1 at night with resolution of diarrhea. Last A1c in April was 7.1. -Check current A1c to assess control. -Consider medication change depending on A1c results.

## 2022-12-28 NOTE — Assessment & Plan Note (Signed)
Patient reports doing well on Trelegy. -Refill provided

## 2023-01-08 ENCOUNTER — Other Ambulatory Visit: Payer: Self-pay | Admitting: Family Medicine

## 2023-01-08 DIAGNOSIS — I10 Essential (primary) hypertension: Secondary | ICD-10-CM

## 2023-01-11 ENCOUNTER — Other Ambulatory Visit: Payer: Self-pay | Admitting: Family Medicine

## 2023-01-20 ENCOUNTER — Other Ambulatory Visit: Payer: Self-pay | Admitting: Family Medicine

## 2023-01-20 DIAGNOSIS — E782 Mixed hyperlipidemia: Secondary | ICD-10-CM

## 2023-02-01 ENCOUNTER — Other Ambulatory Visit: Payer: Self-pay | Admitting: Family Medicine

## 2023-02-01 DIAGNOSIS — E669 Obesity, unspecified: Secondary | ICD-10-CM

## 2023-02-01 DIAGNOSIS — F32A Depression, unspecified: Secondary | ICD-10-CM

## 2023-03-05 ENCOUNTER — Telehealth: Payer: Self-pay | Admitting: Family Medicine

## 2023-03-05 NOTE — Telephone Encounter (Signed)
Pt advised.

## 2023-03-05 NOTE — Telephone Encounter (Signed)
Copied from CRM [id number]. Topic: General - Call Back - No Documentation >> Mar 05, 2023 10:45 AM Samuel Jester B wrote: Reason for CRM: PT stated that she would like a call back to discuss her cancelled appointments.

## 2023-03-19 ENCOUNTER — Ambulatory Visit: Payer: Self-pay | Admitting: Family Medicine

## 2023-03-19 ENCOUNTER — Telehealth: Payer: Medicare Other | Admitting: Nurse Practitioner

## 2023-03-19 DIAGNOSIS — J0140 Acute pansinusitis, unspecified: Secondary | ICD-10-CM | POA: Diagnosis not present

## 2023-03-19 DIAGNOSIS — R051 Acute cough: Secondary | ICD-10-CM | POA: Diagnosis not present

## 2023-03-19 MED ORDER — PROMETHAZINE-DM 6.25-15 MG/5ML PO SYRP: 5.0000 mL | ORAL_SOLUTION | Freq: Four times a day (QID) | ORAL | 0 refills | Status: DC | PRN

## 2023-03-19 MED ORDER — DOXYCYCLINE HYCLATE 100 MG PO TABS: 100.0000 mg | ORAL_TABLET | Freq: Two times a day (BID) | ORAL | 0 refills | Status: AC

## 2023-03-19 NOTE — Telephone Encounter (Signed)
   Chief Complaint: sore throat Symptoms: sore throat, cough, post nasal drip, headache, muscle aches Frequency: started Wednesday Pertinent Negatives: Patient denies fever, shortness of breath Disposition: [] ED /[] Urgent Care (no appt availability in office) / [] Appointment(In office/virtual)/ [x]  [facility]/ [] Home Care/ [] Refused Recommended Disposition /[] [facility]/ []  Follow-up with PCP Additional Notes: Patient reports that she has been experiencing sore throat, cough, post nasal drip, headache, and muscle aches since Wednesday. Patient denies fever and sob. Per protocol, this RN attempted to schedule in office appt, no availability until next week. Patient requesting virtual appt since she is not feeling well enough to drive anywhere to be seen. Patient scheduled for virtual UC appt today 1/3. Patient advised to call back before appt if symptoms worsen. Patient verbalized understanding.    Copied from CRM [id number]. Topic: Clinical - Red Word Triage >> Mar 19, 2023 12:31 PM Drema MATSU wrote: Red Word that prompted transfer to Nurse Triage: Patient is requesting antibiotic for her cold. Symptoms are headache, mucus starting to drip in her throat and into chest, and muscle pain. Reason for Disposition  [1] Sore throat with cough/cold symptoms AND [2] present > 5 days  Answer Assessment - Initial Assessment Questions 1. ONSET: When did the throat start hurting? (Hours or days ago)      Wednesday 2. SEVERITY: How bad is the sore throat? (Scale 1-10; mild, moderate or severe)   - MILD (1-3):  Doesn't interfere with eating or normal activities.   - MODERATE (4-7): Interferes with eating some solids and normal activities.   - SEVERE (8-10):  Excruciating pain, interferes with most normal activities.   - SEVERE WITH DYSPHAGIA (10): Can't swallow liquids, drooling.     mild 3. STREP EXPOSURE: Has there been any exposure to strep within the past week? If Yes, ask:  What type of contact occurred?      none 4.  VIRAL SYMPTOMS: Are there any symptoms of a cold, such as a runny nose, cough, hoarse voice or red eyes?      Headache, post-nasal drip, muscle aches 5. FEVER: Do you have a fever? If Yes, ask: What is your temperature, how was it measured, and when did it start?     none 6. PUS ON THE TONSILS: Is there pus on the tonsils in the back of your throat?     none 7. OTHER SYMPTOMS: Do you have any other symptoms? (e.g., difficulty breathing, headache, rash)     none  Protocols used: Sore Throat-A-AH

## 2023-03-19 NOTE — Progress Notes (Signed)
 Virtual Visit Consent   Savannah Stanley, you are scheduled for a virtual visit with a [facility] provider today. Just as with appointments in the office, your consent must be obtained to participate. Your consent will be active for this visit and any virtual visit you may have with one of our providers in the next 365 days. If you have a MyChart account, a copy of this consent can be sent to you electronically.  As this is a virtual visit, video technology does not allow for your provider to perform a traditional examination. This may limit your provider's ability to fully assess your condition. If your provider identifies any concerns that need to be evaluated in person or the need to arrange testing (such as labs, EKG, etc.), we will make arrangements to do so. Although advances in technology are sophisticated, we cannot ensure that it will always work on either your end or our end. If the connection with a video visit is poor, the visit may have to be switched to a telephone visit. With either a video or telephone visit, we are not always able to ensure that we have a secure connection.  By engaging in this virtual visit, you consent to the provision of healthcare and authorize for your insurance to be billed (if applicable) for the services provided during this visit. Depending on your insurance coverage, you may receive a charge related to this service.  I need to obtain your verbal consent now. Are you willing to proceed with your visit today? Savannah Stanley has provided verbal consent on 03/19/2023 for a virtual visit (video or telephone). Lauraine Kitty, FNP  Date: 03/19/2023 6:59 PM  Virtual Visit via Video Note   I, Lauraine Kitty, connected with  Savannah Stanley  ([id number], 1950/06/26) on 03/19/23 at  7:00 PM EST by a video-enabled telemedicine application and verified that I am speaking with the correct person using two identifiers.  Location: Patient: Virtual Visit Location Patient:  Home Provider: Virtual Visit Location Provider: Home Office   I discussed the limitations of evaluation and management by telemedicine and the availability of in person appointments. The patient expressed understanding and agreed to proceed.    History of Present Illness: Savannah Stanley is a 73 y.o. who identifies as a female who was assigned female at birth, and is being seen today for sinus congestion   She started to feel sick earlier in the week she cannot remember   She has a lot of sinus drainage  Some pressure in her maxillary sinuses bilaterally  She feels PND and will cough at times   Denies a fever  She took a home COVID test and it was negative  Has tried Mucinex  and did have some cough medicine left over from another illness that she used   She does have COPD and uses Trelegy everyday  Has used her Albuterol  once a day at most since being sick   Problems:  Patient Active Problem List   Diagnosis Date Noted   Type 2 diabetes mellitus without complication, without long-term current use of insulin  (HCC) 12/28/2022   Knee pain, chronic 07/12/2022   Bite, insect 10/13/2021   Low vitamin D  level 12/25/2020   Nonspecific abnormal electrocardiogram (ECG) (EKG) 08/07/2015   Murmur, heart 05/20/2014   Preventative health care 11/17/2013   Malignant neoplasm of bladder (HCC) 02/16/2013   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   Latex Itching   Medications:  Current Outpatient Medications:    albuterol  (VENTOLIN  HFA) 108 (90 Base) MCG/ACT inhaler, INHALE 2 PUFFS BY MOUTH  INTO THE LUNGS EVERY 6 HOURS AS NEEDED FOR WHEEZING AND SHORTNESS OF BREATH. GENERIC EQUIVALENT FOR PROAIR  HFA, Disp: 34 g, Rfl: 2   betamethasone  valerate (VALISONE ) 0.1 % cream, Apply topically 2 (two) times daily., Disp: 15 g, Rfl: 0   Calcium Citrate-Vitamin D  (CITRACAL + D PO), Take 2 tablets by mouth 2 (two) times daily. , Disp: , Rfl:    citalopram  (CELEXA ) 40 MG tablet, Take 1 tablet (40 mg total) by mouth daily., Disp: 90 tablet, Rfl: 1   Coenzyme Q10 (COQ10) 200 MG CAPS, Take 1 capsule by mouth daily., Disp: , Rfl:    diazepam  (VALIUM ) 2 MG tablet, Take 0.5-2 tablets (1-4 mg total) by mouth every 12 (twelve) hours as needed for anxiety., Disp: 10 tablet, Rfl: 0   EPINEPHrine  0.3 mg/0.3 mL IJ SOAJ injection, Inject 0.3 mLs (0.3 mg total) into the muscle once. (Patient not taking: Reported on 12/28/2022), Disp: 1 Device, Rfl: 1   fluticasone  (FLONASE ) 50 MCG/ACT nasal spray, Place 2 sprays into both nostrils daily., Disp: 16 g, Rfl: 1   Fluticasone -Umeclidin-Vilant (TRELEGY ELLIPTA ) 100-62.5-25 MCG/ACT AEPB, Inhale 1 puff into the lungs daily. INHALE 1 PUFF BY MOUTH INTO THE LUNGS DAILY, Disp: 180 each, Rfl: 1   losartan  (COZAAR ) 50 MG tablet, TAKE 1 TABLET BY MOUTH DAILY GENERIC EQUIVALENT FOR COZAAR , Disp: 90 tablet, Rfl: 1   MELATONIN PO, Take 1 tablet by mouth at bedtime., Disp: , Rfl:    metFORMIN  (GLUCOPHAGE ) 500 MG tablet, TAKE 2 TABLETS BY MOUTH IN THE MORNING AND 1 TABLET EVERY EVENING. NEED APPOINTMENT FOR FURTHER EVALUATION AND/OR LAB TESTING FOR MORE REFILLS, Disp: 90 tablet, Rfl: 0   metoprolol  succinate (TOPROL -XL) 25 MG 24 hr tablet, TAKE 1 TABLET BY MOUTH DAILY GENERIC EQUIVALENT FOR TOPROL  XL, Disp: 90 tablet, Rfl: 1   Multiple Vitamins-Minerals (ZINC PO), Take 1 tablet by mouth daily., Disp: , Rfl:    omeprazole  (PRILOSEC) 20 MG capsule, Take 1 capsule (20 mg total) by mouth 2 (two) times daily as needed., Disp: 180 capsule, Rfl: 0   pravastatin  (PRAVACHOL ) 10 MG tablet, TAKE  1 TABLET BY MOUTH ONCE DAILY . APPOINTMENT REQUIRED FOR FUTURE REFILLS, Disp: 90 tablet, Rfl: 1   Probiotic Product (PROBIOTIC PO), Take 1 capsule by mouth daily., Disp: , Rfl:  No current facility-administered medications for this visit.  Facility-Administered Medications Ordered in Other Visits:    mitoMYcin  (MUTAMYCIN ) chemo injection 40 mg, 40 mg, Bladder Instillation, Once, Matilda Senior, MD  Observations/Objective: Patient is well-developed, well-nourished in no acute distress.  Resting comfortably  at home.  Head is normocephalic, atraumatic.  No labored breathing.  Speech is clear and coherent with logical content.  Patient is alert and oriented at baseline.    Assessment and Plan:  1. Acute non-recurrent pansinusitis (Primary) Continue Flonase    - doxycycline  (VIBRA -TABS) 100 MG tablet; Take 1 tablet (100 mg total) by mouth 2 (two) times daily for 7 days.  Dispense: 14 tablet; Refill: 0  2. Acute cough  Continue Albuterol  as needed and Trelegy as directed  - promethazine -dextromethorphan (PROMETHAZINE -DM) 6.25-15 MG/5ML syrup; Take 5 mLs by mouth 4 (four) times daily as needed for cough.  Dispense: 118 mL; Refill: 0     Follow Up Instructions: I discussed the assessment and treatment plan with the patient. The patient was provided an opportunity to ask questions and all were answered. The patient agreed with the plan and demonstrated an understanding of the instructions.  A copy of instructions were sent to the patient via MyChart unless otherwise noted below.    The patient was advised to call back or seek an in-person evaluation if the symptoms worsen or if the condition fails to improve as anticipated.    Lauraine Kitty, FNP

## 2023-03-22 ENCOUNTER — Other Ambulatory Visit: Payer: Self-pay | Admitting: Family Medicine

## 2023-03-22 DIAGNOSIS — I10 Essential (primary) hypertension: Secondary | ICD-10-CM

## 2023-03-30 ENCOUNTER — Other Ambulatory Visit: Payer: Self-pay | Admitting: Family Medicine

## 2023-03-30 DIAGNOSIS — E1169 Type 2 diabetes mellitus with other specified complication: Secondary | ICD-10-CM

## 2023-04-05 ENCOUNTER — Ambulatory Visit: Payer: Medicare Other | Admitting: Family Medicine

## 2023-04-06 ENCOUNTER — Ambulatory Visit: Payer: Medicare Other | Admitting: Family Medicine

## 2023-04-18 NOTE — Assessment & Plan Note (Signed)
Encourage heart healthy diet such as MIND or DASH diet, increase exercise, avoid trans fats, simple carbohydrates and processed foods, consider a krill or fish or flaxseed oil cap daily. Tolerating Pravastatin

## 2023-04-18 NOTE — Assessment & Plan Note (Signed)
Patient encouraged to maintain heart healthy diet, regular exercise, adequate sleep. Consider daily probiotics. Take medications as prescribed. Labs ordered and reviewed. Dexa scan 08/2020 was normal repeat in 2027 MGM, declines to do them  Colonoscopy, declines to proceed Pap, stopped in 50s Immunizations UTD Given and reviewed copy of ACP documents from [employer] and encouraged to complete and return

## 2023-04-18 NOTE — Assessment & Plan Note (Signed)
She recently was treated for a sinusitis but is doing well presently

## 2023-04-18 NOTE — Assessment & Plan Note (Signed)
 Well controlled, no changes to meds. Encouraged heart healthy diet such as the DASH diet and exercise as tolerated.

## 2023-04-18 NOTE — Assessment & Plan Note (Signed)
 hgba1c acceptable, minimize simple carbs. Increase exercise as tolerated. Continue current meds

## 2023-04-18 NOTE — Assessment & Plan Note (Signed)
 Supplement and monitor

## 2023-04-18 NOTE — Assessment & Plan Note (Addendum)
 Encouraged good sleep hygiene such as dark, quiet room. No blue/green glowing lights such as computer screens in bedroom. No alcohol or stimulants in evening. Cut down on caffeine as able. Regular exercise is helpful but not just prior to bed time.

## 2023-04-19 ENCOUNTER — Other Ambulatory Visit: Payer: Self-pay | Admitting: Family Medicine

## 2023-04-19 DIAGNOSIS — I10 Essential (primary) hypertension: Secondary | ICD-10-CM

## 2023-04-22 ENCOUNTER — Ambulatory Visit: Payer: Self-pay | Admitting: Family Medicine

## 2023-04-22 ENCOUNTER — Ambulatory Visit (INDEPENDENT_AMBULATORY_CARE_PROVIDER_SITE_OTHER): Payer: Medicare Other | Admitting: Family Medicine

## 2023-04-22 VITALS — BP 130/66 | HR 86 | Temp 98.2°F | Resp 18 | Ht 63.0 in | Wt 184.8 lb

## 2023-04-22 DIAGNOSIS — I10 Essential (primary) hypertension: Secondary | ICD-10-CM | POA: Diagnosis not present

## 2023-04-22 DIAGNOSIS — E782 Mixed hyperlipidemia: Secondary | ICD-10-CM

## 2023-04-22 DIAGNOSIS — G4700 Insomnia, unspecified: Secondary | ICD-10-CM

## 2023-04-22 DIAGNOSIS — J441 Chronic obstructive pulmonary disease with (acute) exacerbation: Secondary | ICD-10-CM

## 2023-04-22 DIAGNOSIS — E1169 Type 2 diabetes mellitus with other specified complication: Secondary | ICD-10-CM

## 2023-04-22 DIAGNOSIS — Z0000 Encounter for general adult medical examination without abnormal findings: Secondary | ICD-10-CM

## 2023-04-22 DIAGNOSIS — Z7984 Long term (current) use of oral hypoglycemic drugs: Secondary | ICD-10-CM

## 2023-04-22 DIAGNOSIS — R7989 Other specified abnormal findings of blood chemistry: Secondary | ICD-10-CM | POA: Diagnosis not present

## 2023-04-22 DIAGNOSIS — E669 Obesity, unspecified: Secondary | ICD-10-CM

## 2023-04-22 DIAGNOSIS — E119 Type 2 diabetes mellitus without complications: Secondary | ICD-10-CM

## 2023-04-22 MED ORDER — METFORMIN HCL 500 MG PO TABS: 500.0000 mg | ORAL_TABLET | Freq: Two times a day (BID) | ORAL | 1 refills | Status: DC

## 2023-04-22 MED ORDER — ALBUTEROL SULFATE HFA 108 (90 BASE) MCG/ACT IN AERS: INHALATION_SPRAY | RESPIRATORY_TRACT | 2 refills | Status: DC

## 2023-04-22 NOTE — Patient Instructions (Addendum)
 Cognitive Behavioral Therapy  Advil/Motrin/Ibuprofen all the same Naproxen/Aleve Also Voltaren Diclofenac  All of the above are NSAIDs and you cannot mix them together on the same day Tylenol /Acetaminophen /APAP  mixes well with all of the above but make of 3000 mg in 24 hours  Tylenol  ES 500 mg tabs, 1-2 up to 3 x daily  Start with 1 tab twice daily   Insomnia, online  Magnesium  Glycinate 200-400 mg at bedtime  LTryptophan capsule for sleep   Insomnia Insomnia is a sleep disorder that makes it difficult to fall asleep or stay asleep. Insomnia can cause fatigue, low energy, difficulty concentrating, mood swings, and poor performance at work or school. There are three different ways to classify insomnia: Difficulty falling asleep. Difficulty staying asleep. Waking up too early in the morning. Any type of insomnia can be long-term (chronic) or short-term (acute). Both are common. Short-term insomnia usually lasts for 3 months or less. Chronic insomnia occurs at least three times a week for longer than 3 months. What are the causes? Insomnia may be caused by another condition, situation, or substance, such as: Having certain mental health conditions, such as anxiety and depression. Using caffeine, alcohol, tobacco, or drugs. Having gastrointestinal conditions, such as gastroesophageal reflux disease (GERD). Having certain medical conditions. These include: Asthma. Alzheimer's disease. Stroke. Chronic pain. An overactive thyroid  gland (hyperthyroidism). Other sleep disorders, such as restless legs syndrome and sleep apnea. Menopause. Sometimes, the cause of insomnia may not be known. What increases the risk? Risk factors for insomnia include: Gender. Females are affected more often than males. Age. Insomnia is more common as people get older. Stress and certain medical and mental health conditions. Lack of exercise. Having an irregular work schedule. This may include working  night shifts and traveling between different time zones. What are the signs or symptoms? If you have insomnia, the main symptom is having trouble falling asleep or having trouble staying asleep. This may lead to other symptoms, such as: Feeling tired or having low energy. Feeling nervous about going to sleep. Not feeling rested in the morning. Having trouble concentrating. Feeling irritable, anxious, or depressed. How is this diagnosed? This condition may be diagnosed based on: Your symptoms and medical history. Your health care provider may ask about: Your sleep habits. Any medical conditions you have. Your mental health. A physical exam. How is this treated? Treatment for insomnia depends on the cause. Treatment may focus on treating an underlying condition that is causing the insomnia. Treatment may also include: Medicines to help you sleep. Counseling or therapy. Lifestyle adjustments to help you sleep better. Follow these instructions at home: Eating and drinking  Limit or avoid alcohol, caffeinated beverages, and products that contain nicotine and tobacco, especially close to bedtime. These can disrupt your sleep. Do not eat a large meal or eat spicy foods right before bedtime. This can lead to digestive discomfort that can make it hard for you to sleep. Sleep habits  Keep a sleep diary to help you and your health care provider figure out what could be causing your insomnia. Write down: When you sleep. When you wake up during the night. How well you sleep and how rested you feel the next day. Any side effects of medicines you are taking. What you eat and drink. Make your bedroom a dark, comfortable place where it is easy to fall asleep. Put up shades or blackout curtains to block light from outside. Use a white noise machine to block noise. Keep the temperature  cool. Limit screen use before bedtime. This includes: Not watching TV. Not using your smartphone, tablet, or  computer. Stick to a routine that includes going to bed and waking up at the same times every day and night. This can help you fall asleep faster. Consider making a quiet activity, such as reading, part of your nighttime routine. Try to avoid taking naps during the day so that you sleep better at night. Get out of bed if you are still awake after 15 minutes of trying to sleep. Keep the lights down, but try reading or doing a quiet activity. When you feel sleepy, go back to bed. General instructions Take over-the-counter and prescription medicines only as told by your health care provider. Exercise regularly as told by your health care provider. However, avoid exercising in the hours right before bedtime. Use relaxation techniques to manage stress. Ask your health care provider to suggest some techniques that may work well for you. These may include: Breathing exercises. Routines to release muscle tension. Visualizing peaceful scenes. Make sure that you drive carefully. Do not drive if you feel very sleepy. Keep all follow-up visits. This is important. Contact a health care provider if: You are tired throughout the day. You have trouble in your daily routine due to sleepiness. You continue to have sleep problems, or your sleep problems get worse. Get help right away if: You have thoughts about hurting yourself or someone else. Get help right away if you feel like you may hurt yourself or others, or have thoughts about taking your own life. Go to your nearest emergency room or: Call 911. Call the National Suicide Prevention Lifeline at (715)251-3371 or 988. This is open 24 hours a day. Text the Crisis Text Line at (423) 038-4513. Summary Insomnia is a sleep disorder that makes it difficult to fall asleep or stay asleep. Insomnia can be long-term (chronic) or short-term (acute). Treatment for insomnia depends on the cause. Treatment may focus on treating an underlying condition that is causing the  insomnia. Keep a sleep diary to help you and your health care provider figure out what could be causing your insomnia. This information is not intended to replace advice given to you by your health care provider. Make sure you discuss any questions you have with your health care provider. Document Revised: 02/10/2021 Document Reviewed: 02/10/2021 Elsevier Patient Education  2024 [employer].

## 2023-04-22 NOTE — Telephone Encounter (Signed)
 Routing to office for review.   Copied from CRM [id number]. Topic: Clinical - Medication Question >> Apr 22, 2023  4:26 PM Laurier BROCKS wrote: Reason for CRM:  Earnie from [employer] [phone] Would like a call back. She is looking for clarification and directions/quanity amount for the following medications: metFORMIN  (GLUCOPHAGE ) 500 MG tablet.

## 2023-04-23 ENCOUNTER — Telehealth: Payer: Self-pay

## 2023-04-23 LAB — COMPREHENSIVE METABOLIC PANEL
ALT: 9 U/L (ref 0–35)
AST: 14 U/L (ref 0–37)
Albumin: 4.5 g/dL (ref 3.5–5.2)
Alkaline Phosphatase: 55 U/L (ref 39–117)
BUN: 14 mg/dL (ref 6–23)
CO2: 26 meq/L (ref 19–32)
Calcium: 9.5 mg/dL (ref 8.4–10.5)
Chloride: 100 meq/L (ref 96–112)
Creatinine, Ser: 0.62 mg/dL (ref 0.40–1.20)
GFR: 87.59 mL/min (ref >60.00)
Glucose, Bld: 76 mg/dL (ref 70–99)
Potassium: 4.2 meq/L (ref 3.5–5.1)
Sodium: 141 meq/L (ref 135–145)
Total Bilirubin: 0.3 mg/dL (ref 0.2–1.2)
Total Protein: 6.7 g/dL (ref 6.0–8.3)

## 2023-04-23 LAB — CBC WITH DIFFERENTIAL/PLATELET
Basophils Absolute: 0.1 10*3/uL (ref 0.0–0.1)
Basophils Relative: 0.7 % (ref 0.0–3.0)
Eosinophils Absolute: 0.2 10*3/uL (ref 0.0–0.7)
Eosinophils Relative: 1.8 % (ref 0.0–5.0)
HCT: 46.1 % — ABNORMAL HIGH (ref 36.0–46.0)
Hemoglobin: 15.2 g/dL — ABNORMAL HIGH (ref 12.0–15.0)
Lymphocytes Relative: 20.7 % (ref 12.0–46.0)
Lymphs Abs: 1.9 10*3/uL (ref 0.7–4.0)
MCHC: 33 g/dL (ref 30.0–36.0)
MCV: 95.9 fL (ref 78.0–100.0)
Monocytes Absolute: 0.8 10*3/uL (ref 0.1–1.0)
Monocytes Relative: 8.6 % (ref 3.0–12.0)
Neutro Abs: 6.3 10*3/uL (ref 1.4–7.7)
Neutrophils Relative %: 68.2 % (ref 43.0–77.0)
Platelets: 220 10*3/uL (ref 150.0–400.0)
RBC: 4.81 Mil/uL (ref 3.87–5.11)
RDW: 12.9 % (ref 11.5–15.5)
WBC: 9.2 10*3/uL (ref 4.0–10.5)

## 2023-04-23 LAB — LIPID PANEL
Cholesterol: 182 mg/dL (ref 0–200)
HDL: 67 mg/dL (ref >39.00)
LDL Cholesterol: 92 mg/dL (ref 0–99)
NonHDL: 115.3
Total CHOL/HDL Ratio: 3
Triglycerides: 119 mg/dL (ref 0.0–149.0)
VLDL: 23.8 mg/dL (ref 0.0–40.0)

## 2023-04-23 LAB — TSH: TSH: 1.53 u[IU]/mL (ref 0.35–5.50)

## 2023-04-23 LAB — HEMOGLOBIN A1C: Hgb A1c MFr Bld: 7.4 % — ABNORMAL HIGH (ref 4.6–6.5)

## 2023-04-23 NOTE — Progress Notes (Signed)
 Care Guide Pharmacy Note  04/23/2023 Name: Savannah Stanley MRN: [id number] DOB: 01-07-1951  Referred By: Domenica Harlene LABOR, MD Reason for referral: Care Coordination (Outreach to schedule with Pharm d )   Savannah Stanley is a 73 y.o. year old female who is a primary care patient of Domenica Harlene LABOR, MD.  Savannah Stanley was referred to the pharmacist for assistance related to: COPD  Successful contact was made with the patient to discuss pharmacy services including being ready for the pharmacist to call at least 5 minutes before the scheduled appointment time and to have medication bottles and any blood pressure readings ready for review. The patient agreed to meet with the pharmacist via telephone visit on (date/time).04/28/2023  Jeoffrey Buffalo , RMA     [place]  [facility], [facility] Guide  Direct Dial: [phone]  Website: [email or web address]

## 2023-04-26 ENCOUNTER — Encounter: Payer: Self-pay | Admitting: Family Medicine

## 2023-04-26 NOTE — Progress Notes (Signed)
 Subjective:    Patient ID: Savannah Stanley, female    DOB: 1950-09-11, 73 y.o.   MRN: [id number]  Chief Complaint  Patient presents with  . Follow-up    HPI Discussed the use of AI scribe software for clinical note transcription with the patient, who gave verbal consent to proceed.  History of Present Illness   The patient, with a history of diabetes and arthritis, presents for a medication refill and to discuss various health concerns. The patient reports bouts of diarrhea, which she attributes to metformin . She has reduced her metformin  dosage from twice daily to once daily, which has reportedly improved the diarrhea. The patient's blood glucose response to this change is unknown.  The patient also reports sleep issues, stating she only sleeps for four to five hours a night and often wakes up after urinating. She currently takes Unisom nightly but expresses interest in other non-addictive sleep aids.  The patient uses Voltaren for arthritis pain and inquires about the best over-the-counter medication for this condition. She expresses a preference for avoiding medication when possible but acknowledges that the pain can sometimes be debilitating.  The patient has a family history of cancer, with a sister-in-law recently diagnosed with breast cancer. The patient has previously received a shingles shot and a pneumonia shot and inquires about the need for additional shots.        Past Medical History:  Diagnosis Date  . Acute bronchitis with COPD (HCC) 03/19/2015  . Bladder cancer (HCC) UROLOGIST-- DR MATILDA   HIGH-GRADE UROTHELIAL CARCINOMA  . Bladder tumor multiple   some ORs; some removed in office  . COPD (chronic obstructive pulmonary disease) (HCC)   . Depression   . Diabetes mellitus type 2 in obese 01/31/2015  . GERD (gastroesophageal reflux disease)   . H/O hiatal hernia   . High cholesterol   . History of blood transfusion 01/2013 - 02/2013   related to ORs  .  Hypertension   . Iron deficiency anemia   . Murmur, heart 05/20/2014  . Pneumonia 07/2010; 08/07/2015  . Type 2 diabetes mellitus (HCC)     Past Surgical History:  Procedure Laterality Date  . APPENDECTOMY  1980's  . CYSTOSCOPY N/A 12/18/2013   Procedure: CYSTOSCOPY;  Surgeon: Garnette CHRISTELLA Matilda, MD;  Location: [facility];  Service: Urology;  Laterality: N/A;  . TRANSURETHRAL RESECTION OF BLADDER TUMOR N/A 02/16/2013   Procedure: TRANSURETHRAL RESECTION OF BLADDER TUMOR (TURBT);  Surgeon: Garnette Matilda, MD;  Location: [facility];  Service: Urology;  Laterality: N/A;  . TRANSURETHRAL RESECTION OF BLADDER TUMOR N/A 12/18/2013   Procedure: TRANSURETHRAL RESECTION OF BLADDER TUMOR (TURBT) ;  Surgeon: Garnette CHRISTELLA Matilda, MD;  Location: [facility];  Service: Urology;  Laterality: N/A;  . TRANSURETHRAL RESECTION OF BLADDER TUMOR WITH GYRUS (TURBT-GYRUS) N/A 01/23/2013   Procedure: TRANSURETHRAL RESECTION OF BLADDER TUMOR WITH GYRUS ;  Surgeon: Garnette Matilda, MD;  Location: [facility];  Service: Urology;  Laterality: N/A;  . TUBAL LIGATION  1980's    Family History  Problem Relation Age of Onset  . Cancer Father        prostate  . Arthritis Brother   . Frontotemporal dementia Brother   . Dementia Brother   . Cancer Daughter        breast    Social History   Socioeconomic History  . Marital status: Widowed    Spouse name: Not on file  . Number of children: Not on  file  . Years of education: Not on file  . Highest education level: Some college, no degree  Occupational History  . Not on file  Tobacco Use  . Smoking status: Former    Current packs/day: 0.00    Average packs/day: 1 pack/day for 35.0 years (35.0 ttl pk-yrs)    Types: Cigarettes    Start date: 07/15/1975    Quit date: 07/15/2010    Years since quitting: 12.7  . Smokeless tobacco: Never  Substance and Sexual Activity  . Alcohol use: Yes    Comment: wine socially   .  Drug use: No  . Sexual activity: Not on file  Other Topics Concern  . Not on file  Social History Narrative  . Not on file   Social Drivers of Health   Financial Resource Strain: Low Risk  (04/16/2023)   Overall Financial Resource Strain (CARDIA)   . Difficulty of Paying Living Expenses: Not hard at all  Food Insecurity: No Food Insecurity (04/16/2023)   Hunger Vital Sign   . Worried About Programme Researcher, Broadcasting/film/video in the Last Year: Never true   . Ran Out of Food in the Last Year: Never true  Transportation Needs: No Transportation Needs (04/16/2023)   PRAPARE - Transportation   . Lack of Transportation (Medical): No   . Lack of Transportation (Non-Medical): No  Physical Activity: Unknown (04/16/2023)   Exercise Vital Sign   . Days of Exercise per Week: Patient declined   . Minutes of Exercise per Session: Not on file  Stress: Stress Concern Present (04/16/2023)   [employer] of Occupational Health - Occupational Stress Questionnaire   . Feeling of Stress : Rather much  Social Connections: Moderately Isolated (04/16/2023)   Social Connection and Isolation Panel [NHANES]   . Frequency of Communication with Friends and Family: More than three times a week   . Frequency of Social Gatherings with Friends and Family: More than three times a week   . Attends Religious Services: 1 to 4 times per year   . Active Member of Clubs or Organizations: No   . Attends Banker Meetings: Not on file   . Marital Status: Widowed  Intimate Partner Violence: Not At Risk (10/21/2020)   Humiliation, Afraid, Rape, and Kick questionnaire   . Fear of Current or Ex-Partner: No   . Emotionally Abused: No   . Physically Abused: No   . Sexually Abused: No    Outpatient Medications Prior to Visit  Medication Sig Dispense Refill  . betamethasone  valerate (VALISONE ) 0.1 % cream Apply topically 2 (two) times daily. 15 g 0  . Calcium Citrate-Vitamin D  (CITRACAL + D PO) Take 2 tablets by mouth 2 (two)  times daily.     . citalopram  (CELEXA ) 40 MG tablet Take 1 tablet (40 mg total) by mouth daily. 90 tablet 1  . Coenzyme Q10 (COQ10) 200 MG CAPS Take 1 capsule by mouth daily.    . diazepam  (VALIUM ) 2 MG tablet Take 0.5-2 tablets (1-4 mg total) by mouth every 12 (twelve) hours as needed for anxiety. 10 tablet 0  . EPINEPHrine  0.3 mg/0.3 mL IJ SOAJ injection Inject 0.3 mLs (0.3 mg total) into the muscle once. 1 Device 1  . fluticasone  (FLONASE ) 50 MCG/ACT nasal spray Place 2 sprays into both nostrils daily. 16 g 1  . Fluticasone -Umeclidin-Vilant (TRELEGY ELLIPTA ) 100-62.5-25 MCG/ACT AEPB Inhale 1 puff into the lungs daily. INHALE 1 PUFF BY MOUTH INTO THE LUNGS DAILY 180 each 1  .  losartan  (COZAAR ) 50 MG tablet TAKE 1 TABLET BY MOUTH DAILY GENERIC EQUIVALENT FOR COZAAR  90 tablet 1  . MELATONIN PO Take 1 tablet by mouth at bedtime.    . metoprolol  succinate (TOPROL -XL) 25 MG 24 hr tablet TAKE 1 TABLET BY MOUTH DAILY GENERIC EQUIVALENT FOR TOPROL  XL 90 tablet 1  . Multiple Vitamins-Minerals (ZINC PO) Take 1 tablet by mouth daily.    . omeprazole  (PRILOSEC) 20 MG capsule Take 1 capsule (20 mg total) by mouth 2 (two) times daily as needed. 180 capsule 0  . pravastatin  (PRAVACHOL ) 10 MG tablet TAKE 1 TABLET BY MOUTH ONCE DAILY . APPOINTMENT REQUIRED FOR FUTURE REFILLS 90 tablet 1  . Probiotic Product (PROBIOTIC PO) Take 1 capsule by mouth daily.    . promethazine -dextromethorphan (PROMETHAZINE -DM) 6.25-15 MG/5ML syrup Take 5 mLs by mouth 4 (four) times daily as needed for cough. 118 mL 0  . albuterol  (VENTOLIN  HFA) 108 (90 Base) MCG/ACT inhaler INHALE 2 PUFFS BY MOUTH INTO THE LUNGS EVERY 6 HOURS AS NEEDED FOR WHEEZING AND SHORTNESS OF BREATH. GENERIC EQUIVALENT FOR PROAIR  HFA 34 g 2  . metFORMIN  (GLUCOPHAGE ) 500 MG tablet Take 2 tablets by mouth every morning and 1 tablet by mouth every evening 270 tablet 0   Facility-Administered Medications Prior to Visit  Medication Dose Route Frequency Provider Last  Rate Last Admin  . mitoMYcin  (MUTAMYCIN ) chemo injection 40 mg  40 mg Bladder Instillation Once Dahlstedt, Stephen, MD        Allergies  Allergen Reactions  . Bee Venom Shortness Of Breath  . Other Shortness Of Breath    MSG.  . Latex Itching    Review of Systems  Constitutional:  Negative for fever and malaise/fatigue.  HENT:  Negative for congestion.   Eyes:  Negative for blurred vision.  Respiratory:  Negative for shortness of breath.   Cardiovascular:  Negative for chest pain, palpitations and leg swelling.  Gastrointestinal:  Negative for abdominal pain, blood in stool and nausea.  Genitourinary:  Negative for dysuria and frequency.  Musculoskeletal:  Positive for joint pain. Negative for falls.  Skin:  Negative for rash.  Neurological:  Negative for dizziness, loss of consciousness and headaches.  Endo/Heme/Allergies:  Negative for environmental allergies.  Psychiatric/Behavioral:  Negative for depression. The patient has insomnia. The patient is not nervous/anxious.        Objective:    Physical Exam Constitutional:      General: She is not in acute distress.    Appearance: Normal appearance. She is well-developed. She is not toxic-appearing.  HENT:     Head: Normocephalic and atraumatic.     Right Ear: External ear normal.     Left Ear: External ear normal.     Nose: Nose normal.  Eyes:     General:        Right eye: No discharge.        Left eye: No discharge.     Conjunctiva/sclera: Conjunctivae normal.  Neck:     Thyroid : No thyromegaly.  Cardiovascular:     Rate and Rhythm: Normal rate and regular rhythm.     Heart sounds: Normal heart sounds. No murmur heard. Pulmonary:     Effort: Pulmonary effort is normal. No respiratory distress.     Breath sounds: Normal breath sounds.  Abdominal:     General: Bowel sounds are normal.     Palpations: Abdomen is soft.     Tenderness: There is no abdominal tenderness. There is no guarding.  Musculoskeletal:  General: Normal range of motion.     Cervical back: Neck supple.  Lymphadenopathy:     Cervical: No cervical adenopathy.  Skin:    General: Skin is warm and dry.  Neurological:     Mental Status: She is alert and oriented to person, place, and time.  Psychiatric:        Mood and Affect: Mood normal.        Behavior: Behavior normal.        Thought Content: Thought content normal.        Judgment: Judgment normal.    BP 130/66 (BP Location: Left Arm, Patient Position: Sitting, Cuff Size: Normal)   Pulse 86   Temp 98.2 F (36.8 C) (Oral)   Resp 18   Ht 5' 3 (1.6 m)   Wt 184 lb 12.8 oz (83.8 kg)   SpO2 94%   BMI 32.74 kg/m  Wt Readings from Last 3 Encounters:  04/22/23 184 lb 12.8 oz (83.8 kg)  12/28/22 185 lb (83.9 kg)  07/09/22 189 lb 3.2 oz (85.8 kg)    Diabetic Foot Exam - Simple   No data filed    Lab Results  Component Value Date   WBC 9.2 04/22/2023   HGB 15.2 (H) 04/22/2023   HCT 46.1 (H) 04/22/2023   PLT 220.0 04/22/2023   GLUCOSE 76 04/22/2023   CHOL 182 04/22/2023   TRIG 119.0 04/22/2023   HDL 67.00 04/22/2023   LDLCALC 92 04/22/2023   ALT 9 04/22/2023   AST 14 04/22/2023   NA 141 04/22/2023   K 4.2 04/22/2023   CL 100 04/22/2023   CREATININE 0.62 04/22/2023   BUN 14 04/22/2023   CO2 26 04/22/2023   TSH 1.53 04/22/2023   INR 1.04 01/18/2013   HGBA1C 7.4 (H) 04/22/2023   MICROALBUR <0.7 07/15/2015    Lab Results  Component Value Date   TSH 1.53 04/22/2023   Lab Results  Component Value Date   WBC 9.2 04/22/2023   HGB 15.2 (H) 04/22/2023   HCT 46.1 (H) 04/22/2023   MCV 95.9 04/22/2023   PLT 220.0 04/22/2023   Lab Results  Component Value Date   NA 141 04/22/2023   K 4.2 04/22/2023   CO2 26 04/22/2023   GLUCOSE 76 04/22/2023   BUN 14 04/22/2023   CREATININE 0.62 04/22/2023   BILITOT 0.3 04/22/2023   ALKPHOS 55 04/22/2023   AST 14 04/22/2023   ALT 9 04/22/2023   PROT 6.7 04/22/2023   ALBUMIN 4.5 04/22/2023   CALCIUM 9.5  04/22/2023   ANIONGAP 9 08/09/2015   GFR 87.59 04/22/2023   Lab Results  Component Value Date   CHOL 182 04/22/2023   Lab Results  Component Value Date   HDL 67.00 04/22/2023   Lab Results  Component Value Date   LDLCALC 92 04/22/2023   Lab Results  Component Value Date   TRIG 119.0 04/22/2023   Lab Results  Component Value Date   CHOLHDL 3 04/22/2023   Lab Results  Component Value Date   HGBA1C 7.4 (H) 04/22/2023       Assessment & Plan:  Mixed hyperlipidemia Assessment & Plan: Encourage heart healthy diet such as MIND or DASH diet, increase exercise, avoid trans fats, simple carbohydrates and processed foods, consider a krill or fish or flaxseed oil cap daily. Tolerating Pravastatin   Orders: -     Lipid panel  Primary hypertension Assessment & Plan: Well controlled, no changes to meds. Encouraged heart healthy diet such as the  DASH diet and exercise as tolerated.   Orders: -     CBC with Differential/Platelet -     Comprehensive metabolic panel -     TSH  Low vitamin D  level Assessment & Plan: Supplement and monitor    Insomnia, unspecified type Assessment & Plan: Encouraged good sleep hygiene such as dark, quiet room. No blue/green glowing lights such as computer screens in bedroom. No alcohol or stimulants in evening. Cut down on caffeine as able. Regular exercise is helpful but not just prior to bed time.     Type 2 diabetes mellitus without complication, without long-term current use of insulin  (HCC) Assessment & Plan: hgba1c acceptable, minimize simple carbs. Increase exercise as tolerated. Continue current meds   Orders: -     Hemoglobin A1c -     Microalbumin / creatinine urine ratio; Future  Chronic obstructive pulmonary disease with acute exacerbation (HCC) Assessment & Plan: She recently was treated for a sinusitis but is doing well presently  Orders: -     AMB Referral VBCI Care Management  Preventative health care Assessment &  Plan: Patient encouraged to maintain heart healthy diet, regular exercise, adequate sleep. Consider daily probiotics. Take medications as prescribed. Labs ordered and reviewed. Dexa scan 08/2020 was normal repeat in 2027 MGM, declines to do them  Colonoscopy, declines to proceed Pap, stopped in 50s Immunizations UTD Given and reviewed copy of ACP documents from [employer] and encouraged to complete and return      Type 2 diabetes mellitus with obesity (HCC) -     metFORMIN  HCl; Take 1 tablet (500 mg total) by mouth 2 (two) times daily with a meal. Take 2 tablets by mouth every morning and 1 tablet by mouth every evening  Dispense: 180 tablet; Refill: 1  Other orders -     Albuterol  Sulfate HFA; INHALE 2 PUFFS BY MOUTH INTO THE LUNGS EVERY 6 HOURS AS NEEDED FOR WHEEZING AND SHORTNESS OF BREATH. GENERIC EQUIVALENT FOR PROAIR  HFA  Dispense: 34 g; Refill: 2    Assessment and Plan    COPD Improved with Trelegy. Frequent chest infections leading to increased chest pain. -Continue Trelegy as tolerated. -Consider referral to value-based care management for potential assistance with medication costs. -Prescribe ProAir  for use as needed, send prescription to [facility]. -Advise use of Mucinex  for mucus management.  Type 2 Diabetes Recent bouts of diarrhea possibly related to Metformin  use. Patient self-adjusted dose to one pill twice daily with improvement in symptoms. -Approve Metformin  adjustment to one pill twice daily. -Consider addition of Ozempic, Trulicity, Mounjaro, or Jardiance  for better glycemic control and potential weight loss benefits. -Order glucometer and test strips for home glucose monitoring.  Arthritis Using Voltaren gel for knee and elbow pain. -Continue Voltaren gel as tolerated. -Advise addition of Tylenol  for additional pain control.  Insomnia Difficulty maintaining sleep, currently using Unisom. -Consider cognitive behavioral therapy for insomnia  (CBTi). -Advise trial of magnesium  glycinate and L-tryptophan for sleep onset.  General Health Maintenance -Advise caution due to high flu activity in the area. -Confirm completion of shingles vaccine series in 2019 and pneumonia vaccine in 2022. -Plan for virtual visit in 4 months and in-person physical in the fall. -Advise patient to obtain home testing kits for flu and COVID for use if feeling unwell.         Harlene Horton, MD

## 2023-04-27 ENCOUNTER — Other Ambulatory Visit: Payer: Self-pay | Admitting: Emergency Medicine

## 2023-04-27 ENCOUNTER — Telehealth: Payer: Self-pay

## 2023-04-27 DIAGNOSIS — E119 Type 2 diabetes mellitus without complications: Secondary | ICD-10-CM

## 2023-04-27 DIAGNOSIS — I10 Essential (primary) hypertension: Secondary | ICD-10-CM

## 2023-04-27 MED ORDER — EMPAGLIFLOZIN 10 MG PO TABS: 10.0000 mg | ORAL_TABLET | Freq: Every day | ORAL | 1 refills | Status: DC

## 2023-04-27 NOTE — Telephone Encounter (Signed)
Copied from CRM [id number]. Topic: Clinical - Medication Question >> Apr 27, 2023  3:59 PM Fredrich Romans wrote: Reason for CRM: [facility] - [place], [place] - [address]  Phone: [phone] Fax: [phone]  Called in stating that there are two different directions for medication metFORMIN (GLUCOPHAGE) 500 MG tablet,and they would like to know which is correct?

## 2023-04-28 ENCOUNTER — Ambulatory Visit (INDEPENDENT_AMBULATORY_CARE_PROVIDER_SITE_OTHER): Payer: Medicare Other | Admitting: Pharmacist

## 2023-04-28 DIAGNOSIS — E119 Type 2 diabetes mellitus without complications: Secondary | ICD-10-CM

## 2023-04-28 DIAGNOSIS — Z79899 Other long term (current) drug therapy: Secondary | ICD-10-CM

## 2023-04-28 DIAGNOSIS — Z7984 Long term (current) use of oral hypoglycemic drugs: Secondary | ICD-10-CM

## 2023-04-28 NOTE — Progress Notes (Signed)
04/28/2023 Name: Savannah Stanley MRN: [id number] DOB: 08/25/1950  Chief Complaint  Patient presents with   Medication Management   Diabetes   COPD    Savannah Stanley is a 73 y.o. year old female who presented for a telephone visit.   They were referred to the pharmacist by their PCP for assistance in managing diabetes, medication access, complex medication management, and COPD .    Subjective:  Medication Access/Adherence  Current Pharmacy:  [facility] [address] [place] - [address] [place] [address] Phone: [phone] Fax: [phone]  [facility] - [place], [place] - [address] [place] [address] Phone: [phone] Fax: [phone]   Patient reports affordability concerns with their medications: Yes  Patient reports access/transportation concerns to their pharmacy: No  Patient reports adherence concerns with their medications:  No      Diabetes:  Current medications:  metformin 500mg  - prescribed to take 2 tablets each morning and 1 tablet each evening but patient experienced diarrhea so she has cut dose to 500mg  twice a day - this dose improved diarrhea but she still has some GI issues.  She was prescribed Jardiance 10mg  daily 04/22/2023 but has not picked up / started yet.  Patient is unsure if she is to continue metformin or not.   Medications tried in the past: none. Discussed GLP1 type medications. She reports that she has had friends have bad experiences with GLP1 due to GI upset and she is worried about possible long term side effects.   Current medication access support: none   COPD:  Current medications:  Trelegy - 1 inhalation daily. Report that the 90 day supply she got at the end of 2024 was > $400 and the recent refill for 90 day supply in January was $465.   Medications tried in the past: Advair - inadequate therapeutic response.    Reports 1 exacerbations in the past year  Medication Management / Access:  Patient reports several medications on her medication list that she is no longer taking.  She also asks about possibility of her gummy vitamin C and CoEnzyme Q10 affecting her blood glucose. She reviewed sugar and CHO content in vitamin C - was zero and in CoEnzyme Q10 - 3 grams per 2 gummies.     Objective:  Lab Results  Component Value Date   HGBA1C 7.4 (H) 04/22/2023    Lab Results  Component Value Date   CREATININE 0.62 04/22/2023   BUN 14 04/22/2023   NA 141 04/22/2023   K 4.2 04/22/2023   CL 100 04/22/2023   CO2 26 04/22/2023    Lab Results  Component Value Date   CHOL 182 04/22/2023   HDL 67.00 04/22/2023   LDLCALC 92 04/22/2023   TRIG 119.0 04/22/2023   CHOLHDL 3 04/22/2023    Medications Reviewed Today     Reviewed by Henrene Pastor, RPH-CPP (Pharmacist) on 04/28/23 at [phone]  Med List Status: <None>   Medication Order Taking? Sig Documenting Provider Last Dose Status Informant  albuterol (VENTOLIN HFA) 108 (90 Base) MCG/ACT inhaler [id number] Yes INHALE 2 PUFFS BY MOUTH INTO THE LUNGS EVERY 6 HOURS AS NEEDED FOR WHEEZING AND SHORTNESS OF BREATH. GENERIC EQUIVALENT FOR PROAIR HFA Bradd Canary, MD Taking Active   Calcium Citrate-Vitamin D (CITRACAL + D PO) [id number] Yes Take 2 tablets by mouth 2 (two) times daily.  [provider] Taking  Active Self  citalopram (CELEXA) 40 MG tablet [id number] Yes Take 1 tablet (40 mg total) by mouth daily. Bradd Canary, MD Taking Active   Coenzyme Q10 (COQ10) 200 MG CAPS [id number] Yes Take 1 each by mouth daily. gummies [provider] Taking Active Self  doxylamine, Sleep, (UNISOM) 25 MG tablet [id number] Yes Take 25 mg by mouth at bedtime as needed. [provider] Taking Active   empagliflozin (JARDIANCE) 10 MG TABS tablet [id number] No Take 1 tablet (10 mg total) by mouth daily before breakfast.  Patient not taking: Reported  on 04/28/2023   Bradd Canary, MD Not Taking Active   Ferrous Sulfate (SLOW FE PO) [id number] Yes Take 1 tablet by mouth every other day. [provider] Taking Active   fluticasone (FLONASE) 50 MCG/ACT nasal spray [id number] Yes Place 2 sprays into both nostrils daily. Saguier, Ramon Dredge, PA-C Taking Active   Fluticasone-Umeclidin-Vilant (TRELEGY ELLIPTA) 100-62.5-25 MCG/ACT AEPB [id number] Yes Inhale 1 puff into the lungs daily. INHALE 1 PUFF BY MOUTH INTO THE LUNGS DAILY Clayborne Dana, NP Taking Active   losartan (COZAAR) 50 MG tablet [id number] Yes TAKE 1 TABLET BY MOUTH DAILY GENERIC EQUIVALENT FOR COZAAR Bradd Canary, MD Taking Active   MAGNESIUM GLYCINATE PLUS PO [id number] Yes Take 400 mg by mouth at bedtime. [provider] Taking Active   metFORMIN (GLUCOPHAGE) 500 MG tablet [id number]  Take 1 tablet (500 mg total) by mouth 2 (two) times daily with a meal. Take 2 tablets by mouth every morning and 1 tablet by mouth every evening Bradd Canary, MD  Active   metoprolol succinate (TOPROL-XL) 25 MG 24 hr tablet [id number] Yes TAKE 1 TABLET BY MOUTH DAILY GENERIC EQUIVALENT FOR TOPROL XL Bradd Canary, MD Taking Active   Multiple Vitamins-Minerals (ZINC PO) [id number] Yes Take 1 tablet by mouth daily. [provider] Taking Active Self  omeprazole (PRILOSEC) 20 MG capsule [id number] Yes Take 1 capsule (20 mg total) by mouth 2 (two) times daily as needed.  Patient taking differently: Take 20 mg by mouth daily at 12 noon.   Bradd Canary, MD Taking Active   pravastatin (PRAVACHOL) 10 MG tablet [id number] Yes TAKE 1 TABLET BY MOUTH ONCE DAILY . APPOINTMENT REQUIRED FOR FUTURE REFILLS Bradd Canary, MD Taking Active   Probiotic Product (PROBIOTIC PO) [id number] Yes Take 1 capsule by mouth daily. [provider] Taking Active Self  promethazine-dextromethorphan (PROMETHAZINE-DM) 6.25-15 MG/5ML syrup [id number]  Take 5 mLs by mouth 4 (four) times daily as needed for  cough. Viviano Simas, FNP  Active               Assessment/Plan:   Diabetes: A1c not currently at goal of < 7.0%  - Recommend to start Jardiance 10mg  daily. Reviewed her 2025 formulary coverage - $45 per 30 days or $90 per 90 days. She is OK with this cost.   - Will check with Dr Abner Greenspan about continuing metformin - I would suggest possibly changing to metformin ER 500mg  and take 1 tablet daily with largest meal of the day. Could increase in future if needed.  - Discussed GLP1 type agents for DM. Discussed pros and cons. Patient would prefer not to take a GLP1 / GIP type agent at this time.    COPD: Currently controlled.  - Recommend to Trelegy once daily - Explained that she has a $375 deductible to meet for tier 3 medications for 2025. So when she filled Trelelgy for the first time for  90 days cost was $375 + $90 = $465. Going forward cost of Trelegy will be $90 / 90 days. She reports this is acceptable.   - To apply for Trelelgy medication assistance program will have to spend $600 out of pocket in 2025.   Medication Management / Access:  - Reviewed medication list and updated.  - Ok to continue CoEnzyme Q10 gummies - 2 per day - 3 grams of sugar likely not affecting blood glucose much.  - Reviewed 2025 Medicare plan - patient has met her deductible and cost for tier 3 meds will now be $45 per 30 days or $90 per 90 days.   Follow Up Plan: 1 to 2 months to check blood glucose.   Henrene Pastor, PharmD Clinical Pharmacist [facility]

## 2023-04-28 NOTE — Telephone Encounter (Signed)
Called and spoke with pharmacist at [facility]. Instructions were updated on Metformin

## 2023-05-05 MED ORDER — METFORMIN HCL ER 500 MG PO TB24: 500.0000 mg | ORAL_TABLET | Freq: Every day | ORAL | 1 refills | Status: DC

## 2023-05-05 NOTE — Progress Notes (Signed)
05/05/2023 - Addendum Changed metformin 500mg  to metformin ER 500mg  - take 1 tablet with largest meal of the day. Updated Rx sent to [facility] mail order pharmacy. Patient notified of changes.   Henrene Pastor, PharmD Clinical Pharmacist [facility].

## 2023-05-05 NOTE — Addendum Note (Signed)
Addended by: Henrene Pastor B on: 05/05/2023 03:11 PM   Modules accepted: Orders

## 2023-06-02 ENCOUNTER — Ambulatory Visit (INDEPENDENT_AMBULATORY_CARE_PROVIDER_SITE_OTHER): Payer: Medicare Other | Admitting: Pharmacist

## 2023-06-02 ENCOUNTER — Telehealth (INDEPENDENT_AMBULATORY_CARE_PROVIDER_SITE_OTHER): Admitting: Family Medicine

## 2023-06-02 ENCOUNTER — Encounter: Payer: Self-pay | Admitting: Family Medicine

## 2023-06-02 VITALS — BP 121/84 | Ht 63.0 in | Wt 184.0 lb

## 2023-06-02 DIAGNOSIS — J0140 Acute pansinusitis, unspecified: Secondary | ICD-10-CM | POA: Diagnosis not present

## 2023-06-02 DIAGNOSIS — E119 Type 2 diabetes mellitus without complications: Secondary | ICD-10-CM

## 2023-06-02 DIAGNOSIS — R051 Acute cough: Secondary | ICD-10-CM | POA: Diagnosis not present

## 2023-06-02 DIAGNOSIS — J441 Chronic obstructive pulmonary disease with (acute) exacerbation: Secondary | ICD-10-CM

## 2023-06-02 MED ORDER — AMOXICILLIN-POT CLAVULANATE 875-125 MG PO TABS
1.0000 | ORAL_TABLET | Freq: Two times a day (BID) | ORAL | 0 refills | Status: DC
Start: 2023-06-02 — End: 2023-11-08

## 2023-06-02 MED ORDER — ALBUTEROL SULFATE HFA 108 (90 BASE) MCG/ACT IN AERS: 2.0000 | INHALATION_SPRAY | Freq: Four times a day (QID) | RESPIRATORY_TRACT | 2 refills | Status: DC | PRN

## 2023-06-02 MED ORDER — PROMETHAZINE-DM 6.25-15 MG/5ML PO SYRP: 5.0000 mL | ORAL_SOLUTION | Freq: Four times a day (QID) | ORAL | 0 refills | Status: AC | PRN

## 2023-06-02 NOTE — Progress Notes (Signed)
 06/02/2023 Name: Savannah Stanley MRN: [id number] DOB: 1950-06-20  No chief complaint on file.   Savannah Stanley is a 73 y.o. year old female who presented for a telephone visit.   They were referred to the pharmacist by their PCP for assistance in managing diabetes, medication access, complex medication management, and COPD .    Subjective:  Medication Access/Adherence  Current Pharmacy:  [facility] [address], [place] - [address] [place] [address] Phone: [phone] Fax: [phone]  [facility] - [place], [place] - [address] [place] [address] Phone: [phone] Fax: [phone]   Patient reports affordability concerns with their medications: Yes  Patient reports access/transportation concerns to their pharmacy: No  Patient reports adherence concerns with their medications:  No      Diabetes:  Current medications:  Jardiance 10mg  daily - recently started. Tlerating well. Also starte metformin ER 500mg  once a day - she report she is having less diarrhea with lower dose and extended release metformin.  Medications tried in the past: none. Discussed GLP1 type medications. She reports that she has had friends have bad experiences with GLP1 due to GI upset and she is worried about possible long term side effects.   Does not check blood glucose at home.   Current medication access support: none   COPD:  Current medications:  Trelegy - 1 inhalation daily. Albuterol inhaler as needed (usually uses albuterol about once per week but has needed more the last week due to URI - started Augmentin today)   Medications tried in the past: Advair - inadequate therapeutic response.   Reports 2 exacerbation in the past year  Medication Management / Access:  Patient states she received a letter from her insurance that her albuterol inhaler was approved for just one fill  and that it is not on her formulary. Checked her 2025 formulary and it has Ventolin or generic fro Ventolin HFA 90 mcg inhaler as preferred brand $45 / 30 days or $90 / 90 days.     Objective:  Lab Results  Component Value Date   HGBA1C 7.4 (H) 04/22/2023    Lab Results  Component Value Date   CREATININE 0.62 04/22/2023   BUN 14 04/22/2023   NA 141 04/22/2023   K 4.2 04/22/2023   CL 100 04/22/2023   CO2 26 04/22/2023    Lab Results  Component Value Date   CHOL 182 04/22/2023   HDL 67.00 04/22/2023   LDLCALC 92 04/22/2023   TRIG 119.0 04/22/2023   CHOLHDL 3 04/22/2023    Medications Reviewed Today     Reviewed by Henrene Pastor, RPH-CPP (Pharmacist) on 06/02/23 at 1439  Med List Status: <None>   Medication Order Taking? Sig Documenting Provider Last Dose Status Informant  Discontinued 06/02/23 1433 (Change in therapy)   albuterol (VENTOLIN HFA) 108 (90 Base) MCG/ACT inhaler [id number] Yes Inhale 2 puffs into the lungs every 6 (six) hours as needed for wheezing or shortness of breath. Please dispense brand Ventolin or generic Ventolin as these are preferred for 2025 by her BCBS plan. Bradd Canary, MD  Active   amoxicillin-clavulanate (AUGMENTIN) 875-125 MG tablet [id number]  Take 1 tablet by mouth 2 (two) times daily. Clayborne Dana, NP  Active   Calcium Citrate-Vitamin D (CITRACAL + D PO) [id number] No Take 2 tablets by mouth 2 (two) times daily.  [provider] Taking Active  Self  citalopram (CELEXA) 40 MG tablet [id number] No Take 1 tablet (40 mg total) by mouth daily. Bradd Canary, MD Taking Active   Coenzyme Q10 (COQ10) 200 MG CAPS [id number] No Take 1 each by mouth daily. gummies [provider] Taking Active Self  doxylamine, Sleep, (UNISOM) 25 MG tablet [id number] No Take 25 mg by mouth at bedtime as needed. [provider] Taking Active   empagliflozin (JARDIANCE) 10 MG TABS tablet [id number] No Take 1 tablet (10 mg total) by mouth daily before  breakfast.  Patient not taking: Reported on 04/28/2023   Bradd Canary, MD Not Taking Active   Ferrous Sulfate (SLOW FE PO) [id number] No Take 1 tablet by mouth every other day. [provider] Taking Active   fluticasone (FLONASE) 50 MCG/ACT nasal spray [id number] No Place 2 sprays into both nostrils daily. Saguier, Ramon Dredge, PA-C Taking Active   Fluticasone-Umeclidin-Vilant (TRELEGY ELLIPTA) 100-62.5-25 MCG/ACT AEPB [id number] No Inhale 1 puff into the lungs daily. INHALE 1 PUFF BY MOUTH INTO THE LUNGS DAILY Clayborne Dana, NP Taking Active   losartan (COZAAR) 50 MG tablet [id number] No TAKE 1 TABLET BY MOUTH DAILY GENERIC EQUIVALENT FOR COZAAR Bradd Canary, MD Taking Active   MAGNESIUM GLYCINATE PLUS PO [id number] No Take 400 mg by mouth at bedtime. [provider] Taking Active   metFORMIN (GLUCOPHAGE-XR) 500 MG 24 hr tablet [id number]  Take 1 tablet (500 mg total) by mouth daily. With largest meal. Bradd Canary, MD  Active   metoprolol succinate (TOPROL-XL) 25 MG 24 hr tablet [id number] No TAKE 1 TABLET BY MOUTH DAILY GENERIC EQUIVALENT FOR TOPROL XL Bradd Canary, MD Taking Active   mitoMYcin [facility] chemo injection 40 mg [id number]   [name], MD  Active   Multiple Vitamins-Minerals (ZINC PO) [id number] No Take 1 tablet by mouth daily. [provider] Taking Active Self  omeprazole (PRILOSEC) 20 MG capsule [id number] No Take 1 capsule (20 mg total) by mouth 2 (two) times daily as needed.  Patient taking differently: Take 20 mg by mouth daily at 12 noon.   Bradd Canary, MD Taking Active   pravastatin (PRAVACHOL) 10 MG tablet [id number] No TAKE 1 TABLET BY MOUTH ONCE DAILY . APPOINTMENT REQUIRED FOR FUTURE REFILLS Bradd Canary, MD Taking Active   Probiotic Product (PROBIOTIC PO) [id number] No Take 1 capsule by mouth daily. [provider] Taking Active Self  promethazine-dextromethorphan (PROMETHAZINE-DM) 6.25-15 MG/5ML syrup [id number]  Take 5  mLs by mouth 4 (four) times daily as needed for cough. Clayborne Dana, NP  Active               Assessment/Plan:   Diabetes: A1c not currently at goal of < 7.0%  - Recommend to continue Jardiance 10mg  daily and metformin Er 500mg  once daily with largest meal. .     COPD: Currently needing to use rescue inhaler more than usual due to URI - started antibiotic today. - Recommend to continue Trelegy once inhalation once daily - Explained that she has a $375 deductible to meet for tier 3 medications for 2025. So when she filled Trelelgy for the first time for 90 days cost was $375 + $90 = $465. Going forward cost of Trelegy will be $90 / 90 days. She reports this is acceptable.   - To apply for Trelelgy medication assistance program will have to spend $600 out of pocket in 2025.   Medication Management / Access:  - Reviewed medication list and updated.  -  Reviewed 2025 Medicare plan for preferred albuterol inhalers - looks like Ventolin HFA or generic Ventolin HFA is preferred. - Rx was sent to [facility] at patient request.  Meds ordered this encounter  Medications   albuterol (VENTOLIN HFA) 108 (90 Base) MCG/ACT inhaler    Sig: Inhale 2 puffs into the lungs every 6 (six) hours as needed for wheezing or shortness of breath. Please dispense brand Ventolin or generic Ventolin as these are preferred for 2025 by her BCBS plan.    Dispense:  18 g    Refill:  2      Follow Up Plan: 1 to 2 months to check blood glucose.   Henrene Pastor, PharmD Clinical Pharmacist [facility]

## 2023-06-02 NOTE — Progress Notes (Signed)
 Virtual Video Visit via MyChart Note  I connected with  Savannah Stanley on 06/02/23 at  8:20 AM EDT by the video enabled telemedicine application for MyChart, and verified that I am speaking with the correct person using two identifiers.   I introduced myself as a Publishing rights manager with the practice. We discussed the limitations of evaluation and management by telemedicine and the availability of in person appointments. The patient expressed understanding and agreed to proceed.  Participating parties in this visit include: The patient and the nurse practitioner listed.  The patient is: At home I am: In the office - [facility]  Subjective:    CC:  Chief Complaint  Patient presents with   Sinus Problem   Cough    HPI: Savannah Stanley is a 73 y.o. year old female presenting today via MyChart today for sinusitis, cough.   Discussed the use of AI scribe software for clinical note transcription with the patient, who gave verbal consent to proceed.  History of Present Illness Savannah Stanley "Savannah Stanley" is a 73 year old female who presents with sinus congestion and cough.  She has been experiencing sinus congestion for almost a week, which began with sinus pressure and congestion and progressed to chest congestion and a cough. The cough is her most severe symptom, characterized by thick mucus production, though the mucus is not dark. She has no fever or hemoptysis but experiences some difficulty breathing, which is not severe.  She has been using her albuterol inhaler and Trelegy for relief. Additionally, she has been taking Mucinex and had some leftover promethazine DM cough syrup, which she has now exhausted. She recalls a sinus infection in January that was treated with doxycycline, which was somewhat effective.  Currently, she is maintaining hydration with water and ginger ale, although she has a reduced appetite and is making herself eat.        Past  medical history, Surgical history, Family history not pertinant except as noted below, Social history, Allergies, and medications have been entered into the medical record, reviewed, and corrections made.   Review of Systems:  All review of systems negative except what is listed in the HPI   Objective:    General:  Speaking clearly in complete sentences. Absent shortness of breath noted.   Alert and oriented x3.   Normal judgment.  Absent acute distress.   Impression and Recommendations:    Problem List Items Addressed This Visit   None Visit Diagnoses       Acute non-recurrent pansinusitis    -  Primary   Relevant Medications   amoxicillin-clavulanate (AUGMENTIN) 875-125 MG tablet   promethazine-dextromethorphan (PROMETHAZINE-DM) 6.25-15 MG/5ML syrup     Acute cough       Relevant Medications   promethazine-dextromethorphan (PROMETHAZINE-DM) 6.25-15 MG/5ML syrup       Assessment & Plan Acute Sinusitis with Associated Cough Symptoms consistent with acute sinusitis. Cough likely due to post-nasal drip or bronchial irritation.  - Prescribe Augmentin  - Prescribe promethazine DM syrup for cough relief. - Advise continuation of Mucinex for mucus relief. - Recommend lifestyle measures including using a humidifier, warm compresses, and maintaining hydration. - Encourage rest to aid recovery. Patient aware of signs/symptoms requiring further/urgent evaluation.       Follow-up if symptoms worsen or fail to improve.    I discussed the assessment and treatment plan with the patient. The patient was provided an opportunity to ask questions and all were  answered. The patient agreed with the plan and demonstrated an understanding of the instructions.   The patient was advised to call back or seek an in-person evaluation if the symptoms worsen or if the condition fails to improve as anticipated.   Clayborne Dana, NP

## 2023-06-02 NOTE — Progress Notes (Signed)
 Started Friday Sinus pressure - thick mucous Traveled to chest - cough (productive)  No sore throat/body aches/chills

## 2023-06-21 ENCOUNTER — Other Ambulatory Visit: Payer: Self-pay | Admitting: Family Medicine

## 2023-07-12 ENCOUNTER — Other Ambulatory Visit: Payer: Self-pay | Admitting: Family Medicine

## 2023-07-12 DIAGNOSIS — E782 Mixed hyperlipidemia: Secondary | ICD-10-CM

## 2023-08-06 ENCOUNTER — Other Ambulatory Visit (INDEPENDENT_AMBULATORY_CARE_PROVIDER_SITE_OTHER): Payer: Medicare Other

## 2023-08-06 DIAGNOSIS — E119 Type 2 diabetes mellitus without complications: Secondary | ICD-10-CM

## 2023-08-06 DIAGNOSIS — I10 Essential (primary) hypertension: Secondary | ICD-10-CM

## 2023-08-06 LAB — CBC WITH DIFFERENTIAL/PLATELET
Basophils Absolute: 0 10*3/uL (ref 0.0–0.1)
Basophils Relative: 0.6 % (ref 0.0–3.0)
Eosinophils Absolute: 0.1 10*3/uL (ref 0.0–0.7)
Eosinophils Relative: 1.2 % (ref 0.0–5.0)
HCT: 50.3 % — ABNORMAL HIGH (ref 36.0–46.0)
Hemoglobin: 16.7 g/dL — ABNORMAL HIGH (ref 12.0–15.0)
Lymphocytes Relative: 24.9 % (ref 12.0–46.0)
Lymphs Abs: 2 10*3/uL (ref 0.7–4.0)
MCHC: 33.2 g/dL (ref 30.0–36.0)
MCV: 94.7 fl (ref 78.0–100.0)
Monocytes Absolute: 0.7 10*3/uL (ref 0.1–1.0)
Monocytes Relative: 8.5 % (ref 3.0–12.0)
Neutro Abs: 5.3 10*3/uL (ref 1.4–7.7)
Neutrophils Relative %: 64.8 % (ref 43.0–77.0)
Platelets: 224 10*3/uL (ref 150.0–400.0)
RBC: 5.31 Mil/uL — ABNORMAL HIGH (ref 3.87–5.11)
RDW: 13.2 % (ref 11.5–15.5)
WBC: 8.1 10*3/uL (ref 4.0–10.5)

## 2023-08-06 LAB — HEMOGLOBIN A1C: Hgb A1c MFr Bld: 7.4 % — ABNORMAL HIGH (ref 4.6–6.5)

## 2023-08-09 ENCOUNTER — Ambulatory Visit: Payer: Self-pay | Admitting: Family Medicine

## 2023-08-13 ENCOUNTER — Other Ambulatory Visit: Payer: Self-pay | Admitting: Family Medicine

## 2023-08-13 DIAGNOSIS — J441 Chronic obstructive pulmonary disease with (acute) exacerbation: Secondary | ICD-10-CM

## 2023-08-14 ENCOUNTER — Other Ambulatory Visit: Payer: Self-pay | Admitting: Family Medicine

## 2023-08-14 DIAGNOSIS — F32A Depression, unspecified: Secondary | ICD-10-CM

## 2023-08-25 NOTE — Assessment & Plan Note (Signed)
 hgba1c acceptable, minimize simple carbs. Increase exercise as tolerated. Continue current meds

## 2023-08-25 NOTE — Assessment & Plan Note (Signed)
 Supplement and monitor

## 2023-08-25 NOTE — Assessment & Plan Note (Signed)
 Mild, asymptomatic.

## 2023-08-26 ENCOUNTER — Telehealth (INDEPENDENT_AMBULATORY_CARE_PROVIDER_SITE_OTHER): Payer: Medicare Other | Admitting: Family Medicine

## 2023-08-26 DIAGNOSIS — R000 Tachycardia, unspecified: Secondary | ICD-10-CM | POA: Diagnosis not present

## 2023-08-26 DIAGNOSIS — E119 Type 2 diabetes mellitus without complications: Secondary | ICD-10-CM | POA: Diagnosis not present

## 2023-08-26 DIAGNOSIS — R7989 Other specified abnormal findings of blood chemistry: Secondary | ICD-10-CM | POA: Diagnosis not present

## 2023-08-26 DIAGNOSIS — Z7984 Long term (current) use of oral hypoglycemic drugs: Secondary | ICD-10-CM

## 2023-08-26 DIAGNOSIS — G8929 Other chronic pain: Secondary | ICD-10-CM

## 2023-08-26 DIAGNOSIS — M25569 Pain in unspecified knee: Secondary | ICD-10-CM

## 2023-08-26 MED ORDER — METFORMIN HCL ER 500 MG PO TB24: 500.0000 mg | ORAL_TABLET | Freq: Two times a day (BID) | ORAL | 1 refills | Status: DC

## 2023-08-27 ENCOUNTER — Other Ambulatory Visit: Payer: Self-pay

## 2023-08-27 DIAGNOSIS — I10 Essential (primary) hypertension: Secondary | ICD-10-CM

## 2023-08-27 DIAGNOSIS — E782 Mixed hyperlipidemia: Secondary | ICD-10-CM

## 2023-08-27 DIAGNOSIS — E119 Type 2 diabetes mellitus without complications: Secondary | ICD-10-CM

## 2023-08-27 DIAGNOSIS — E559 Vitamin D deficiency, unspecified: Secondary | ICD-10-CM

## 2023-08-29 ENCOUNTER — Encounter: Payer: Self-pay | Admitting: Family Medicine

## 2023-08-29 NOTE — Assessment & Plan Note (Signed)
 Referred to orthopaedics for further evaluation. Encouraged moist heat and gentle stretching as tolerated. May try NSAIDs and prescription meds as directed and report if symptoms worsen or seek immediate care

## 2023-08-29 NOTE — Progress Notes (Signed)
 MyChart Video Visit    Virtual Visit via Video Note   This patient is at least at moderate risk for complications without adequate follow up. This format is felt to be most appropriate for this patient at this time. Physical exam was limited by quality of the video and audio technology used for the visit. [name], CMA was able to get the patient set up on a video visit.  Patient location: home Patient and provider in visit Provider location: Office  I discussed the limitations of evaluation and management by telemedicine and the availability of in person appointments. The patient expressed understanding and agreed to proceed.  Visit Date: 08/26/2023  Today's healthcare provider: Randie Bustle, MD     Subjective:    Patient ID: Savannah Stanley, female    DOB: [birth year], 73 y.o.   MRN: [id number]  Chief Complaint  Patient presents with   Medical Management of Chronic Issues    Patient presents today for a 3 month follow-up.   Quality Metric Gaps    Lung cancer screening, AWV, colonoscopy, foot & eye exam, urine mircoalbumin    HPI Patient is in today for follow up on chronic medical concerns. No recent febrile illness or hospitalizations. Denies CP/palp/SOB/HA/congestion/fevers/GI or GU c/o. Taking meds as prescribed. She is struggling with knee pain and she is ready to be seen by orthopaedics. No recent fall or trauma. No complaints of heat or swelling. No complaints of polyuria or polydipsia. She is eating well and minimizing carbohydrates.   Past Medical History:  Diagnosis Date   Acute bronchitis with COPD (HCC) 03/19/2015   Bladder cancer (HCC) UROLOGIST-- DR Joie Narrow   HIGH-GRADE UROTHELIAL CARCINOMA   Bladder tumor multiple   some ORs; some removed in office   COPD (chronic obstructive pulmonary disease) (HCC)    Depression    Diabetes mellitus type 2 in obese 01/31/2015   GERD (gastroesophageal reflux disease)    H/O hiatal hernia    High cholesterol     History of blood transfusion 01/2013 - 02/2013   related to ORs   Hypertension    Iron deficiency anemia    Murmur, heart 05/20/2014   Pneumonia 07/2010; 08/07/2015   Type 2 diabetes mellitus [facility])     Past Surgical History:  Procedure Laterality Date   APPENDECTOMY  1980's   CYSTOSCOPY N/A 12/18/2013   Procedure: CYSTOSCOPY;  Surgeon: Roque Collar, MD;  Location: [facility];  Service: Urology;  Laterality: N/A;   TRANSURETHRAL RESECTION OF BLADDER TUMOR N/A 02/16/2013   Procedure: TRANSURETHRAL RESECTION OF BLADDER TUMOR (TURBT);  Surgeon: Trent Frizzle, MD;  Location: [facility];  Service: Urology;  Laterality: N/A;   TRANSURETHRAL RESECTION OF BLADDER TUMOR N/A 12/18/2013   Procedure: TRANSURETHRAL RESECTION OF BLADDER TUMOR (TURBT) ;  Surgeon: Roque Collar, MD;  Location: [facility];  Service: Urology;  Laterality: N/A;   TRANSURETHRAL RESECTION OF BLADDER TUMOR WITH GYRUS (TURBT-GYRUS) N/A 01/23/2013   Procedure: TRANSURETHRAL RESECTION OF BLADDER TUMOR WITH GYRUS ;  Surgeon: Trent Frizzle, MD;  Location: [facility];  Service: Urology;  Laterality: N/A;   TUBAL LIGATION  1980's    Family History  Problem Relation Age of Onset   Cancer Father        prostate   Arthritis Brother    Frontotemporal dementia Brother    Dementia Brother    Cancer Daughter        breast    Social History  Socioeconomic History   Marital status: Widowed    Spouse name: Not on file   Number of children: Not on file   Years of education: Not on file   Highest education level: Some college, no degree  Occupational History   Not on file  Tobacco Use   Smoking status: Former    Current packs/day: 0.00    Average packs/day: 1 pack/day for 35.0 years (35.0 ttl pk-yrs)    Types: Cigarettes    Start date: 07/15/1975    Quit date: 07/15/2010    Years since quitting: 13.1   Smokeless tobacco: Never  Substance and Sexual Activity   Alcohol  use: Yes    Comment: wine socially    Drug use: No   Sexual activity: Not on file  Other Topics Concern   Not on file  Social History Narrative   Not on file   Social Drivers of Health   Financial Resource Strain: Low Risk  (04/16/2023)   Overall Financial Resource Strain (CARDIA)    Difficulty of Paying Living Expenses: Not hard at all  Food Insecurity: No Food Insecurity (04/16/2023)   Hunger Vital Sign    Worried About Running Out of Food in the Last Year: Never true    Ran Out of Food in the Last Year: Never true  Transportation Needs: No Transportation Needs (04/16/2023)   PRAPARE - Administrator, Civil Service (Medical): No    Lack of Transportation (Non-Medical): No  Physical Activity: Unknown (04/16/2023)   Exercise Vital Sign    Days of Exercise per Week: Patient declined    Minutes of Exercise per Session: Not on file  Stress: Stress Concern Present (04/16/2023)   [employer] of Occupational Health - Occupational Stress Questionnaire    Feeling of Stress : Rather much  Social Connections: Moderately Isolated (04/16/2023)   Social Connection and Isolation Panel    Frequency of Communication with Friends and Family: More than three times a week    Frequency of Social Gatherings with Friends and Family: More than three times a week    Attends Religious Services: 1 to 4 times per year    Active Member of [employer] or Organizations: No    Attends Banker Meetings: Not on file    Marital Status: Widowed  Intimate Partner Violence: Not At Risk (10/21/2020)   Humiliation, Afraid, Rape, and Kick questionnaire    Fear of Current or Ex-Partner: No    Emotionally Abused: No    Physically Abused: No    Sexually Abused: No    Outpatient Medications Prior to Visit  Medication Sig Dispense Refill   albuterol  (VENTOLIN  HFA) 108 (90 Base) MCG/ACT inhaler Inhale 2 puffs into the lungs every 6 (six) hours as needed for wheezing or shortness of breath. Please  dispense brand Ventolin  or generic Ventolin  as these are preferred for 2025 by her BC[employer] plan. 18 g 2   amoxicillin -clavulanate (AUGMENTIN ) 875-125 MG tablet Take 1 tablet by mouth 2 (two) times daily. 20 tablet 0   Calcium Citrate-Vitamin D  (CITRACAL + D PO) Take 2 tablets by mouth 2 (two) times daily.      citalopram  (CELEXA ) 40 MG tablet Take 1 tablet by mouth once daily 90 tablet 1   Coenzyme Q10 (COQ10) 200 MG CAPS Take 1 each by mouth daily. gummies     doxylamine, Sleep, (UNISOM) 25 MG tablet Take 25 mg by mouth at bedtime as needed.     empagliflozin  (JARDIANCE ) 10 MG  TABS tablet TAKE 1 TABLET BY MOUTH ONCE DAILY BEFORE BREAKFAST 30 tablet 5   Ferrous Sulfate (SLOW FE PO) Take 1 tablet by mouth every other day.     fluticasone  (FLONASE ) 50 MCG/ACT nasal spray Place 2 sprays into both nostrils daily. 16 g 1   Fluticasone -Umeclidin-Vilant (TRELEGY ELLIPTA ) 100-62.5-25 MCG/ACT AEPB Inhale 1 puff into the lungs daily. 180 each 1   losartan  (COZAAR ) 50 MG tablet TAKE 1 TABLET BY MOUTH DAILY GENERIC EQUIVALENT FOR COZAAR  90 tablet 1   MAGNESIUM  GLYCINATE PLUS PO Take 400 mg by mouth at bedtime.     metoprolol  succinate (TOPROL -XL) 25 MG 24 hr tablet TAKE 1 TABLET BY MOUTH DAILY GENERIC EQUIVALENT FOR TOPROL  XL 90 tablet 1   Multiple Vitamins-Minerals (ZINC PO) Take 1 tablet by mouth daily.     omeprazole  (PRILOSEC) 20 MG capsule Take 1 capsule (20 mg total) by mouth 2 (two) times daily as needed. (Patient taking differently: Take 20 mg by mouth daily at 12 noon.) 180 capsule 0   pravastatin  (PRAVACHOL ) 10 MG tablet TAKE 1 TABLET BY MOUTH ONCE DAILY . APPOINTMENT REQUIRED FOR FUTURE REFILLS 90 tablet 0   Probiotic Product (PROBIOTIC PO) Take 1 capsule by mouth daily.     promethazine -dextromethorphan (PROMETHAZINE -DM) 6.25-15 MG/5ML syrup Take 5 mLs by mouth 4 (four) times daily as needed for cough. 118 mL 0   metFORMIN  (GLUCOPHAGE -XR) 500 MG 24 hr tablet Take 1 tablet (500 mg total) by mouth  daily. With largest meal. 90 tablet 1   Facility-Administered Medications Prior to Visit  Medication Dose Route Frequency Provider Last Rate Last Admin   mitoMYcin  (MUTAMYCIN ) chemo injection 40 mg  40 mg Bladder Instillation Once Dahlstedt, Stephen, MD        Allergies  Allergen Reactions   Bee Venom Shortness Of Breath   Other Shortness Of Breath    MSG.   Latex Itching    Review of Systems  Constitutional:  Negative for fever and malaise/fatigue.  HENT:  Negative for congestion.   Eyes:  Negative for blurred vision.  Respiratory:  Negative for shortness of breath.   Cardiovascular:  Negative for chest pain, palpitations and leg swelling.  Gastrointestinal:  Negative for abdominal pain, blood in stool and nausea.  Genitourinary:  Negative for dysuria and frequency.  Musculoskeletal:  Positive for joint pain. Negative for falls.  Skin:  Negative for rash.  Neurological:  Negative for dizziness, loss of consciousness and headaches.  Endo/Heme/Allergies:  Negative for environmental allergies.  Psychiatric/Behavioral:  Negative for depression. The patient is not nervous/anxious.        Objective:    Physical Exam Constitutional:      General: She is not in acute distress.    Appearance: Normal appearance. She is not ill-appearing or toxic-appearing.  HENT:     Head: Normocephalic and atraumatic.     Right Ear: External ear normal.     Left Ear: External ear normal.     Nose: Nose normal.   Eyes:     General:        Right eye: No discharge.        Left eye: No discharge.   Pulmonary:     Effort: Pulmonary effort is normal.   Skin:    Findings: No rash.   Neurological:     Mental Status: She is alert and oriented to person, place, and time.   Psychiatric:        Behavior: Behavior normal.  There were no vitals taken for this visit. Wt Readings from Last 3 Encounters:  06/02/23 184 lb (83.5 kg)  04/22/23 184 lb 12.8 oz (83.8 kg)  12/28/22 185 lb (83.9  kg)       Assessment & Plan:  Low vitamin D  level Assessment & Plan: Supplement and monitor    Type 2 diabetes mellitus without complication, without long-term current use of insulin  (HCC) Assessment & Plan: hgba1c acceptable, minimize simple carbs. Increase exercise as tolerated. Continue current meds   Orders: -     Microalbumin / creatinine urine ratio; Future  Tachycardia Assessment & Plan: Mild, asymptomatic   Chronic knee pain, unspecified laterality -     Ambulatory referral to Orthopedic Surgery  Other orders -     metFORMIN  HCl ER; Take 1 tablet (500 mg total) by mouth 2 (two) times daily with a meal. With largest meal.  Dispense: 180 tablet; Refill: 1     I discussed the assessment and treatment plan with the patient. The patient was provided an opportunity to ask questions and all were answered. The patient agreed with the plan and demonstrated an understanding of the instructions.   The patient was advised to call back or seek an in-person evaluation if the symptoms worsen or if the condition fails to improve as anticipated.  Randie Bustle, MD [facility] at [facility] [phone] (phone) [phone] (fax)  [facility]

## 2023-09-07 ENCOUNTER — Other Ambulatory Visit: Payer: Self-pay | Admitting: Family Medicine

## 2023-09-07 MED ORDER — FLUTICASONE PROPIONATE 50 MCG/ACT NA SUSP: 2.0000 | Freq: Every day | NASAL | 1 refills | Status: AC

## 2023-09-07 MED ORDER — EMPAGLIFLOZIN 25 MG PO TABS: 25.0000 mg | ORAL_TABLET | Freq: Every day | ORAL | 1 refills | Status: DC

## 2023-09-08 NOTE — Progress Notes (Signed)
 [facility] Quality Team Note  Name: Savannah Stanley Date of Birth: 10/24/1950 MRN: [id number] Date: 09/08/2023  [facility] Quality Team has reviewed this patient's chart, please see recommendations below:  [facility] Quality Other; (CHART REVIEWED FOR COLORECTAL CANCER SCREENING, EYE EXAM FOR DIABETICS AND KIDNEY HEALTH EVALUATION IN DIABETICS. PATIENT DECLINED COLON SCREENING, UACR ORDERED BUT NOT YET COMPLETED, AND NO EYE EXAM FOUND)

## 2023-09-24 ENCOUNTER — Ambulatory Visit: Payer: Self-pay | Admitting: Family Medicine

## 2023-09-24 ENCOUNTER — Other Ambulatory Visit (INDEPENDENT_AMBULATORY_CARE_PROVIDER_SITE_OTHER)

## 2023-09-24 DIAGNOSIS — I10 Essential (primary) hypertension: Secondary | ICD-10-CM | POA: Diagnosis not present

## 2023-09-24 DIAGNOSIS — E119 Type 2 diabetes mellitus without complications: Secondary | ICD-10-CM | POA: Diagnosis not present

## 2023-09-24 LAB — CBC WITH DIFFERENTIAL/PLATELET
Basophils Absolute: 0 K/uL (ref 0.0–0.1)
Basophils Relative: 0.5 % (ref 0.0–3.0)
Eosinophils Absolute: 0.1 K/uL (ref 0.0–0.7)
Eosinophils Relative: 1 % (ref 0.0–5.0)
HCT: 50.9 % — ABNORMAL HIGH (ref 36.0–46.0)
Hemoglobin: 16.7 g/dL — ABNORMAL HIGH (ref 12.0–15.0)
Lymphocytes Relative: 19.9 % (ref 12.0–46.0)
Lymphs Abs: 1.7 K/uL (ref 0.7–4.0)
MCHC: 32.8 g/dL (ref 30.0–36.0)
MCV: 95.1 fl (ref 78.0–100.0)
Monocytes Absolute: 0.5 K/uL (ref 0.1–1.0)
Monocytes Relative: 6.3 % (ref 3.0–12.0)
Neutro Abs: 6.1 K/uL (ref 1.4–7.7)
Neutrophils Relative %: 72.3 % (ref 43.0–77.0)
Platelets: 227 K/uL (ref 150.0–400.0)
RBC: 5.35 Mil/uL — ABNORMAL HIGH (ref 3.87–5.11)
RDW: 13.3 % (ref 11.5–15.5)
WBC: 8.4 K/uL (ref 4.0–10.5)

## 2023-09-27 MED ORDER — METFORMIN HCL ER 500 MG PO TB24: 500.0000 mg | ORAL_TABLET | Freq: Every day | ORAL | Status: AC

## 2023-10-12 ENCOUNTER — Ambulatory Visit (INDEPENDENT_AMBULATORY_CARE_PROVIDER_SITE_OTHER)

## 2023-10-12 VITALS — Ht 63.0 in | Wt 184.0 lb

## 2023-10-12 DIAGNOSIS — Z0000 Encounter for general adult medical examination without abnormal findings: Secondary | ICD-10-CM | POA: Diagnosis not present

## 2023-10-12 NOTE — Patient Instructions (Addendum)
 [name] , Thank you for taking time out of your busy schedule to complete your Annual Wellness Visit with me. I enjoyed our conversation and look forward to speaking with you again next year. I, as well as your care team,  appreciate your ongoing commitment to your health goals. Please review the following plan we discussed and let me know if I can assist you in the future. Your Game plan/ To Do List    Referrals: If you haven't heard from the office you've been referred to, please reach out to them at the phone provided.   Follow up Visits: Next Medicare AWV with our clinical staff: 10/17/24 @ 8:10a   Have you seen your provider in the last 6 months (3 months if uncontrolled diabetes)?  Next Office Visit with your provider: 11/08/23 @ 10:20a  Clinician Recommendations:  Aim for 30 minutes of exercise or brisk walking, 6-8 glasses of water , and 5 servings of fruits and vegetables each day.       This is a list of the screening recommended for you and due dates:  Health Maintenance  Topic Date Due   Eye exam for diabetics  Never done   Yearly kidney health urinalysis for diabetes  Never done   Screening for Lung Cancer  Never done   Hepatitis B Vaccine (2 of 3 - 19+ 3-dose series) 06/19/2019   Colon Cancer Screening  11/23/2020   Complete foot exam   12/24/2021   COVID-19 Vaccine (8 - 2024-25 season) 03/01/2023   DEXA scan (bone density measurement)  12/28/2023*   Flu Shot  10/15/2023   Hemoglobin A1C  02/06/2024   Yearly kidney function blood test for diabetes  04/21/2024   Medicare Annual Wellness Visit  10/11/2024   DTaP/Tdap/Td vaccine (5 - Td or Tdap) 05/21/2029   Pneumococcal Vaccine for age over 75  Completed   Hepatitis C Screening  Completed   Zoster (Shingles) Vaccine  Completed   HPV Vaccine  Aged Out   Meningitis B Vaccine  Aged Out  *Topic was postponed. The date shown is not the original due date.    Advanced directives: (Copy Requested) Please bring a copy of your  health care power of attorney and living will to the office to be added to your chart at your convenience. You can mail to [facility] [address] [place], [place] [address] or email to [email or web address] Advance Care Planning is important because it:  [x]  Makes sure you receive the medical care that is consistent with your values, goals, and preferences  [x]  It provides guidance to your family and loved ones and reduces their decisional burden about whether or not they are making the right decisions based on your wishes.  Follow the link provided in your after visit summary or read over the paperwork we have mailed to you to help you started getting your Advance Directives in place. If you need assistance in completing these, please reach out to us  so that we can help you!  See attachments for Preventive Care and Fall Prevention Tips.

## 2023-10-12 NOTE — Progress Notes (Signed)
 Subjective:   Savannah Stanley is a 73 y.o. who presents for a Medicare Wellness preventive visit.  As a reminder, Annual Wellness Visits don't include a physical exam, and some assessments may be limited, especially if this visit is performed virtually. We may recommend an in-person follow-up visit with your provider if needed.  Visit Complete: Virtual I connected with  Savannah Stanley on 10/12/23 by a audio enabled telemedicine application and verified that I am speaking with the correct person using two identifiers.  Patient Location: Home  Provider Location: Home Office  I discussed the limitations of evaluation and management by telemedicine. The patient expressed understanding and agreed to proceed.  Vital Signs: Because this visit was a virtual/telehealth visit, some criteria may be missing or patient reported. Any vitals not documented were not able to be obtained and vitals that have been documented are patient reported.    Persons Participating in Visit: Patient.  AWV Questionnaire: Yes: Patient Medicare AWV questionnaire was completed by the patient on 10/05/23; I have confirmed that all information answered by patient is correct and no changes since this date.  Cardiac Risk Factors include: advanced age (>28men, >47 women);hypertension;diabetes mellitus     Objective:    Today's Vitals   10/12/23 0814  Weight: 184 lb (83.5 kg)  Height: 5' 3 (1.6 m)   Body mass index is 32.59 kg/m.     10/12/2023    8:23 AM 10/23/2021    2:26 PM 10/21/2020    2:29 PM 10/19/2019    8:56 AM 10/18/2018    8:12 AM 08/24/2017    8:13 AM 08/07/2015   10:57 PM  Advanced Directives  Does Patient Have a Medical Advance Directive? Yes Yes Yes No No Yes  No   Type of Estate agent of [place];Living will Healthcare Power of [place];Living will Healthcare Power of [place];Living will   Healthcare Power of [place];Living will   Does patient want to make changes to medical  advance directive?  No - Patient declined       Copy of Healthcare Power of Attorney in Chart? No - copy requested No - copy requested No - copy requested   No - copy requested    Would patient like information on creating a medical advance directive?    No - Patient declined No - Patient declined   Yes - Educational materials given      Data saved with a previous flowsheet row definition    Current Medications (verified) Outpatient Encounter Medications as of 10/12/2023  Medication Sig   albuterol  (VENTOLIN  HFA) 108 (90 Base) MCG/ACT inhaler Inhale 2 puffs into the lungs every 6 (six) hours as needed for wheezing or shortness of breath. Please dispense brand Ventolin  or generic Ventolin  as these are preferred for 2025 by her BCBS plan.   amoxicillin -clavulanate (AUGMENTIN ) 875-125 MG tablet Take 1 tablet by mouth 2 (two) times daily. (Patient not taking: Reported on 10/12/2023)   Calcium Citrate-Vitamin D  (CITRACAL + D PO) Take 2 tablets by mouth 2 (two) times daily.    citalopram  (CELEXA ) 40 MG tablet Take 1 tablet by mouth once daily   Coenzyme Q10 (COQ10) 200 MG CAPS Take 1 each by mouth daily. gummies   doxylamine, Sleep, (UNISOM) 25 MG tablet Take 25 mg by mouth at bedtime as needed.   empagliflozin  (JARDIANCE ) 25 MG TABS tablet Take 1 tablet (25 mg total) by mouth daily before breakfast.   Ferrous Sulfate (SLOW FE PO) Take 1 tablet  by mouth every other day.   fluticasone  (FLONASE ) 50 MCG/ACT nasal spray Place 2 sprays into both nostrils daily.   Fluticasone -Umeclidin-Vilant (TRELEGY ELLIPTA ) 100-62.5-25 MCG/ACT AEPB Inhale 1 puff into the lungs daily.   losartan  (COZAAR ) 50 MG tablet TAKE 1 TABLET BY MOUTH DAILY GENERIC EQUIVALENT FOR COZAAR    MAGNESIUM  GLYCINATE PLUS PO Take 400 mg by mouth at bedtime.   metFORMIN  (GLUCOPHAGE -XR) 500 MG 24 hr tablet Take 1 tablet (500 mg total) by mouth daily with breakfast. With largest meal.   metoprolol  succinate (TOPROL -XL) 25 MG 24 hr tablet TAKE 1  TABLET BY MOUTH DAILY GENERIC EQUIVALENT FOR TOPROL  XL   Multiple Vitamins-Minerals (ZINC PO) Take 1 tablet by mouth daily.   omeprazole  (PRILOSEC) 20 MG capsule Take 1 capsule (20 mg total) by mouth 2 (two) times daily as needed. (Patient taking differently: Take 20 mg by mouth daily at 12 noon.)   pravastatin  (PRAVACHOL ) 10 MG tablet TAKE 1 TABLET BY MOUTH ONCE DAILY . APPOINTMENT REQUIRED FOR FUTURE REFILLS   Probiotic Product (PROBIOTIC PO) Take 1 capsule by mouth daily.   promethazine -dextromethorphan (PROMETHAZINE -DM) 6.25-15 MG/5ML syrup Take 5 mLs by mouth 4 (four) times daily as needed for cough.   Facility-Administered Encounter Medications as of 10/12/2023  Medication   mitoMYcin  (MUTAMYCIN ) chemo injection 40 mg    Allergies (verified) Bee venom, Other, and Latex   History: Past Medical History:  Diagnosis Date   Acute bronchitis with COPD (HCC) 03/19/2015   Bladder cancer (HCC) UROLOGIST-- DR MATILDA   HIGH-GRADE UROTHELIAL CARCINOMA   Bladder tumor multiple   some ORs; some removed in office   COPD (chronic obstructive pulmonary disease) (HCC)    Depression    Diabetes mellitus type 2 in obese 01/31/2015   GERD (gastroesophageal reflux disease)    H/O hiatal hernia    High cholesterol    History of blood transfusion 01/2013 - 02/2013   related to ORs   Hypertension    Iron deficiency anemia    Murmur, heart 05/20/2014   Pneumonia 07/2010; 08/07/2015   Type 2 diabetes mellitus [facility])    Past Surgical History:  Procedure Laterality Date   APPENDECTOMY  1980's   CYSTOSCOPY N/A 12/18/2013   Procedure: CYSTOSCOPY;  Surgeon: Garnette CHRISTELLA MATILDA, MD;  Location: [facility];  Service: Urology;  Laterality: N/A;   TRANSURETHRAL RESECTION OF BLADDER TUMOR N/A 02/16/2013   Procedure: TRANSURETHRAL RESECTION OF BLADDER TUMOR (TURBT);  Surgeon: Garnette MATILDA, MD;  Location: [facility];  Service: Urology;  Laterality: N/A;   TRANSURETHRAL  RESECTION OF BLADDER TUMOR N/A 12/18/2013   Procedure: TRANSURETHRAL RESECTION OF BLADDER TUMOR (TURBT) ;  Surgeon: Garnette CHRISTELLA MATILDA, MD;  Location: [facility];  Service: Urology;  Laterality: N/A;   TRANSURETHRAL RESECTION OF BLADDER TUMOR WITH GYRUS (TURBT-GYRUS) N/A 01/23/2013   Procedure: TRANSURETHRAL RESECTION OF BLADDER TUMOR WITH GYRUS ;  Surgeon: Garnette MATILDA, MD;  Location: [facility];  Service: Urology;  Laterality: N/A;   TUBAL LIGATION  1980's   Family History  Problem Relation Age of Onset   Cancer Father        prostate   Arthritis Brother    Frontotemporal dementia Brother    Dementia Brother    Cancer Daughter        breast   Social History   Socioeconomic History   Marital status: Widowed    Spouse name: Not on file   Number of children: Not on file   Years  of education: Not on file   Highest education level: Some college, no degree  Occupational History   Not on file  Tobacco Use   Smoking status: Former    Current packs/day: 0.00    Average packs/day: 1 pack/day for 35.0 years (35.0 ttl pk-yrs)    Types: Cigarettes    Start date: 07/15/1975    Quit date: 07/15/2010    Years since quitting: 13.2   Smokeless tobacco: Never  Substance and Sexual Activity   Alcohol use: Yes    Comment: wine socially    Drug use: No   Sexual activity: Not on file  Other Topics Concern   Not on file  Social History Narrative   Not on file   Social Drivers of Health   Financial Resource Strain: Low Risk  (10/12/2023)   Overall Financial Resource Strain (CARDIA)    Difficulty of Paying Living Expenses: Not hard at all  Food Insecurity: No Food Insecurity (10/12/2023)   Hunger Vital Sign    Worried About Running Out of Food in the Last Year: Never true    Ran Out of Food in the Last Year: Never true  Transportation Needs: No Transportation Needs (10/12/2023)   PRAPARE - Administrator, Civil Service (Medical): No    Lack of Transportation  (Non-Medical): No  Physical Activity: Insufficiently Active (10/12/2023)   Exercise Vital Sign    Days of Exercise per Week: 2 days    Minutes of Exercise per Session: 20 min  Stress: No Stress Concern Present (10/12/2023)   [employer] of Occupational Health - Occupational Stress Questionnaire    Feeling of Stress: Only a little  Social Connections: Moderately Isolated (10/12/2023)   Social Connection and Isolation Panel    Frequency of Communication with Friends and Family: More than three times a week    Frequency of Social Gatherings with Friends and Family: More than three times a week    Attends Religious Services: 1 to 4 times per year    Active Member of [employer] or Organizations: No    Attends Banker Meetings: Not on file    Marital Status: Widowed    Tobacco Counseling Counseling given: Not Answered    Clinical Intake:  Pre-visit preparation completed: Yes  Pain : No/denies pain     BMI - recorded: 32.59 Nutritional Status: BMI > 30  Obese Nutritional Risks: None Diabetes: Yes CBG done?: No Did pt. bring in CBG monitor from home?: No  Lab Results  Component Value Date   HGBA1C 7.4 (H) 08/06/2023   HGBA1C 7.4 (H) 04/22/2023   HGBA1C 7.1 (H) 12/28/2022     How often do you need to have someone help you when you read instructions, pamphlets, or other written materials from your doctor or pharmacy?: 1 - Never  Interpreter Needed?: No  Information entered by :: Rojelio Blush LPN   Activities of Daily Living      10/12/2023    8:22 AM 10/05/2023    1:15 PM  In your present state of health, do you have any difficulty performing the following activities:  Hearing? 0 0  Vision? 0 0  Difficulty concentrating or making decisions? 0 0  Walking or climbing stairs? 0 0  Dressing or bathing? 0 0  Doing errands, shopping? 0 0  Preparing Food and eating ? N N  Using the Toilet? N N  In the past six months, have you accidently leaked urine? N N   Do you  have problems with loss of bowel control? N N  Managing your Medications? N N  Managing your Finances? N N  Housekeeping or managing your Housekeeping? N N    Patient Care Team: Domenica Harlene LABOR, MD as PCP - General (Family Medicine) Matilda Senior, MD as Consulting Physician (Urology)   I have updated your Care Teams any recent Medical Services you may have received from other providers in the past year.     Assessment:   This is a routine wellness examination for Savannah Stanley.  Hearing/Vision screen Hearing Screening - Comments:: Denies hearing difficulties   Vision Screening - Comments:: Wears reading glasses -Not up to date with routine eye exams.    Goals Addressed               This Visit's Progress     Increase physical activity (pt-stated)        Lose weight       Depression Screen      10/12/2023    8:25 AM 07/09/2022    8:20 AM 12/04/2021    9:28 AM 10/23/2021    2:26 PM 10/21/2020    2:30 PM 10/19/2019    8:59 AM 10/18/2018    8:12 AM  PHQ 2/9 Scores  PHQ - 2 Score 0 0 0 0 0 0 0  PHQ- 9 Score  0 1        Fall Risk      10/12/2023    8:22 AM 10/05/2023    1:15 PM 07/09/2022    8:20 AM 06/08/2022   11:27 AM 12/04/2021    9:28 AM  Fall Risk   Falls in the past year? 1 1 0 0 0  Number falls in past yr: 0 0 0 0 0  Injury with Fall? 0 0 0 0 0  Risk for fall due to : No Fall Risks      Follow up Falls evaluation completed  Falls evaluation completed  Falls evaluation completed      Data saved with a previous flowsheet row definition    MEDICARE RISK AT HOME:   Medicare Risk at Home Any stairs in or around the home?: Yes If so, are there any without handrails?: No Home free of loose throw rugs in walkways, pet beds, electrical cords, etc?: Yes Adequate lighting in your home to reduce risk of falls?: Yes Life alert?: No Use of a cane, walker or w/c?: No Grab bars in the bathroom?: No Shower chair or bench in shower?: Yes Elevated toilet seat or  a handicapped toilet?: No  TIMED UP AND GO:  Was the test performed?  No  Cognitive Function: 6CIT completed    08/20/2016    8:54 AM  MMSE - Mini Mental State Exam  Orientation to time 5   Orientation to Place 5   Registration 3   Attention/ Calculation 5   Recall 3   Language- name 2 objects 2   Language- repeat 1  Language- follow 3 step command 3   Language- read & follow direction 1   Write a sentence 1   Copy design 1   Total score 30      Data saved with a previous flowsheet row definition        10/12/2023    8:23 AM 10/23/2021    2:31 PM  6CIT Screen  What Year? 0 points 0 points  What month? 0 points 0 points  What time? 0 points 0 points  Count back from  20 0 points 0 points  Months in reverse 0 points 0 points  Repeat phrase 0 points 0 points  Total Score 0 points 0 points    Immunizations Immunization History  Administered Date(s) Administered   Fluad Quad(high Dose 65+) 11/03/2018, 11/14/2021   Influenza Whole 11/24/2010   Influenza, High Dose Seasonal PF 12/07/2020, 11/04/2022   Influenza, Seasonal, Injecte, Preservative Fre 12/28/2014   Influenza,inj,Quad PF,6+ Mos 11/17/2013   Influenza-Unspecified 11/17/2012, 11/29/2015, 11/14/2017   Moderna Covid-19 Vaccine Bivalent Booster 60yrs & up 12/18/2020   Moderna SARS-COV2 Booster Vaccination 12/18/2020   Moderna Sars-Covid-2 Vaccination 04/14/2019, 05/09/2019, 01/17/2020, 07/30/2020   Novavax(Covid-19) Vaccine 12/15/2021   Pfizer Covid-19 Vaccine Bivalent Booster 25yrs & up 12/18/2020, 12/15/2021, 01/04/2023   Pneumococcal Conjugate-13 11/17/2013   Pneumococcal Polysaccharide-23 11/09/2011, 02/12/2017   Pneumococcal-Unspecified 07/30/2020   RSV,unspecified 12/15/2021, 11/30/2022   Td 05/22/2019   Tdap 11/24/2010, 05/22/2019   Vaxelis (DTaP,IPV,Hib,HepB) 05/22/2019   Zoster Recombinant(Shingrix) 10/02/2017, 11/29/2017   Zoster, Live 03/17/2007    Screening Tests Health Maintenance  Topic Date  Due   OPHTHALMOLOGY EXAM  Never done   Diabetic kidney evaluation - Urine ACR  Never done   Lung Cancer Screening  Never done   Hepatitis B Vaccines (2 of 3 - 19+ 3-dose series) 06/19/2019   Colonoscopy  11/23/2020   FOOT EXAM  12/24/2021   COVID-19 Vaccine (8 - 2024-25 season) 03/01/2023   DEXA SCAN  12/28/2023 (Originally 09/11/2022)   INFLUENZA VACCINE  10/15/2023   HEMOGLOBIN A1C  02/06/2024   Diabetic kidney evaluation - eGFR measurement  04/21/2024   Medicare Annual Wellness (AWV)  10/11/2024   DTaP/Tdap/Td (5 - Td or Tdap) 05/21/2029   Pneumococcal Vaccine: 50+ Years  Completed   Hepatitis C Screening  Completed   Zoster Vaccines- Shingrix  Completed   HPV VACCINES  Aged Out   Meningococcal B Vaccine  Aged Out    Health Maintenance  Health Maintenance Due  Topic Date Due   OPHTHALMOLOGY EXAM  Never done   Diabetic kidney evaluation - Urine ACR  Never done   Lung Cancer Screening  Never done   Hepatitis B Vaccines (2 of 3 - 19+ 3-dose series) 06/19/2019   Colonoscopy  11/23/2020   FOOT EXAM  12/24/2021   COVID-19 Vaccine (8 - 2024-25 season) 03/01/2023   Health Maintenance Items Addressed: Eye Exam and Colonoscopy deferred   Additional Screening:  Vision Screening: Recommended annual ophthalmology exams for early detection of glaucoma and other disorders of the eye. Would you like a referral to an eye doctor? No    Dental Screening: Recommended annual dental exams for proper oral hygiene  Community Resource Referral / Chronic Care Management: CRR required this visit?  No   CCM required this visit?  No   Plan:    I have personally reviewed and noted the following in the patient's chart:   Medical and social history Use of alcohol, tobacco or illicit drugs  Current medications and supplements including opioid prescriptions. Patient is not currently taking opioid prescriptions. Functional ability and status Nutritional status Physical activity Advanced  directives List of other physicians Hospitalizations, surgeries, and ER visits in previous 12 months Vitals Screenings to include cognitive, depression, and falls Referrals and appointments  In addition, I have reviewed and discussed with patient certain preventive protocols, quality metrics, and best practice recommendations. A written personalized care plan for preventive services as well as general preventive health recommendations were provided to patient.   Rojelio LELON Blush, LPN  10/12/2023   After Visit Summary: (MyChart) Due to this being a telephonic visit, the after visit summary with patients personalized plan was offered to patient via MyChart   Notes: Nothing significant to report at this time.

## 2023-10-13 DIAGNOSIS — C678 Malignant neoplasm of overlapping sites of bladder: Secondary | ICD-10-CM | POA: Diagnosis not present

## 2023-10-27 ENCOUNTER — Other Ambulatory Visit: Payer: Self-pay | Admitting: Family Medicine

## 2023-10-27 DIAGNOSIS — E782 Mixed hyperlipidemia: Secondary | ICD-10-CM

## 2023-11-02 DIAGNOSIS — M170 Bilateral primary osteoarthritis of knee: Secondary | ICD-10-CM | POA: Diagnosis not present

## 2023-11-02 DIAGNOSIS — G8929 Other chronic pain: Secondary | ICD-10-CM | POA: Diagnosis not present

## 2023-11-02 DIAGNOSIS — M1712 Unilateral primary osteoarthritis, left knee: Secondary | ICD-10-CM | POA: Diagnosis not present

## 2023-11-04 ENCOUNTER — Other Ambulatory Visit (INDEPENDENT_AMBULATORY_CARE_PROVIDER_SITE_OTHER)

## 2023-11-04 DIAGNOSIS — E119 Type 2 diabetes mellitus without complications: Secondary | ICD-10-CM | POA: Diagnosis not present

## 2023-11-04 DIAGNOSIS — E559 Vitamin D deficiency, unspecified: Secondary | ICD-10-CM

## 2023-11-04 DIAGNOSIS — M25562 Pain in left knee: Secondary | ICD-10-CM | POA: Diagnosis not present

## 2023-11-04 DIAGNOSIS — M25561 Pain in right knee: Secondary | ICD-10-CM | POA: Diagnosis not present

## 2023-11-04 DIAGNOSIS — I10 Essential (primary) hypertension: Secondary | ICD-10-CM | POA: Diagnosis not present

## 2023-11-04 DIAGNOSIS — E782 Mixed hyperlipidemia: Secondary | ICD-10-CM | POA: Diagnosis not present

## 2023-11-04 DIAGNOSIS — G8929 Other chronic pain: Secondary | ICD-10-CM | POA: Diagnosis not present

## 2023-11-04 LAB — MICROALBUMIN / CREATININE URINE RATIO
Creatinine,U: 38.9 mg/dL
Microalb Creat Ratio: UNDETERMINED mg/g (ref 0.0–30.0)
Microalb, Ur: <0.7 mg/dL

## 2023-11-04 LAB — CBC WITH DIFFERENTIAL/PLATELET
Basophils Absolute: 0 K/uL (ref 0.0–0.1)
Basophils Relative: 0.4 % (ref 0.0–3.0)
Eosinophils Absolute: 0 K/uL (ref 0.0–0.7)
Eosinophils Relative: 0.5 % (ref 0.0–5.0)
HCT: 49 % — ABNORMAL HIGH (ref 36.0–46.0)
Hemoglobin: 16 g/dL — ABNORMAL HIGH (ref 12.0–15.0)
Lymphocytes Relative: 19.4 % (ref 12.0–46.0)
Lymphs Abs: 2 K/uL (ref 0.7–4.0)
MCHC: 32.6 g/dL (ref 30.0–36.0)
MCV: 95.4 fl (ref 78.0–100.0)
Monocytes Absolute: 0.9 K/uL (ref 0.1–1.0)
Monocytes Relative: 8.5 % (ref 3.0–12.0)
Neutro Abs: 7.3 K/uL (ref 1.4–7.7)
Neutrophils Relative %: 71.2 % (ref 43.0–77.0)
Platelets: 250 K/uL (ref 150.0–400.0)
RBC: 5.13 Mil/uL — ABNORMAL HIGH (ref 3.87–5.11)
RDW: 13.4 % (ref 11.5–15.5)
WBC: 10.3 K/uL (ref 4.0–10.5)

## 2023-11-04 LAB — LIPID PANEL
Cholesterol: 184 mg/dL (ref 0–200)
HDL: 60 mg/dL (ref >39.00)
LDL Cholesterol: 62 mg/dL (ref 0–99)
NonHDL: 123.64
Total CHOL/HDL Ratio: 3
Triglycerides: 308 mg/dL — ABNORMAL HIGH (ref 0.0–149.0)
VLDL: 61.6 mg/dL — ABNORMAL HIGH (ref 0.0–40.0)

## 2023-11-04 LAB — COMPREHENSIVE METABOLIC PANEL WITH GFR
ALT: 11 U/L (ref 0–35)
AST: 13 U/L (ref 0–37)
Albumin: 4.4 g/dL (ref 3.5–5.2)
Alkaline Phosphatase: 47 U/L (ref 39–117)
BUN: 20 mg/dL (ref 6–23)
CO2: 26 meq/L (ref 19–32)
Calcium: 9.1 mg/dL (ref 8.4–10.5)
Chloride: 101 meq/L (ref 96–112)
Creatinine, Ser: 0.63 mg/dL (ref 0.40–1.20)
GFR: 86.92 mL/min (ref >60.00)
Glucose, Bld: 92 mg/dL (ref 70–99)
Potassium: 4.1 meq/L (ref 3.5–5.1)
Sodium: 139 meq/L (ref 135–145)
Total Bilirubin: 0.4 mg/dL (ref 0.2–1.2)
Total Protein: 6.5 g/dL (ref 6.0–8.3)

## 2023-11-04 LAB — TSH: TSH: 1.61 u[IU]/mL (ref 0.35–5.50)

## 2023-11-04 LAB — HEMOGLOBIN A1C: Hgb A1c MFr Bld: 7.7 % — ABNORMAL HIGH (ref 4.6–6.5)

## 2023-11-04 LAB — VITAMIN D 25 HYDROXY (VIT D DEFICIENCY, FRACTURES): VITD: 54.09 ng/mL (ref 30.00–100.00)

## 2023-11-07 NOTE — Assessment & Plan Note (Signed)
 Supplement and monitor

## 2023-11-07 NOTE — Assessment & Plan Note (Signed)
 hgba1c acceptable, minimize simple carbs. Increase exercise as tolerated. Continue current meds

## 2023-11-07 NOTE — Assessment & Plan Note (Signed)
 Well controlled, no changes to meds. Encouraged heart healthy diet such as the DASH diet and exercise as tolerated.

## 2023-11-07 NOTE — Assessment & Plan Note (Signed)
 Encourage heart healthy diet such as MIND or DASH diet, increase exercise, avoid trans fats, simple carbohydrates and processed foods, consider a krill or fish or flaxseed oil cap daily. Tolerating Pravastatin 

## 2023-11-07 NOTE — Assessment & Plan Note (Addendum)
 doing well presently

## 2023-11-08 ENCOUNTER — Ambulatory Visit (INDEPENDENT_AMBULATORY_CARE_PROVIDER_SITE_OTHER): Admitting: Family Medicine

## 2023-11-08 ENCOUNTER — Encounter: Payer: Self-pay | Admitting: Family Medicine

## 2023-11-08 VITALS — BP 126/72 | HR 80 | Resp 16 | Ht 63.0 in | Wt 180.8 lb

## 2023-11-08 DIAGNOSIS — E119 Type 2 diabetes mellitus without complications: Secondary | ICD-10-CM

## 2023-11-08 DIAGNOSIS — I10 Essential (primary) hypertension: Secondary | ICD-10-CM

## 2023-11-08 DIAGNOSIS — R7989 Other specified abnormal findings of blood chemistry: Secondary | ICD-10-CM

## 2023-11-08 DIAGNOSIS — J441 Chronic obstructive pulmonary disease with (acute) exacerbation: Secondary | ICD-10-CM | POA: Diagnosis not present

## 2023-11-08 DIAGNOSIS — E782 Mixed hyperlipidemia: Secondary | ICD-10-CM

## 2023-11-08 DIAGNOSIS — Z7984 Long term (current) use of oral hypoglycemic drugs: Secondary | ICD-10-CM

## 2023-11-08 NOTE — Patient Instructions (Addendum)
 CBTi (insomnia) APP from [place] ([employer])   Chronic Knee Pain, Adult Knee pain that lasts longer than 3 months is called chronic knee pain. You may have pain in one or both knees. Symptoms of chronic knee pain may also include swelling and stiffness. Many conditions can cause chronic knee pain. The most common cause is wear and tear of your knee joint as you get older. Other possible causes include: A disease that causes inflammation of the knee, such as rheumatoid arthritis. This usually affects both knees. A condition called inflammatory arthritis, such as gout. An injury to the knee that causes arthritis. An injury to the knee that damages the ligaments. Ligaments are tissues that connect bones to each other. Runner's knee or pain behind the kneecap. Treatment for chronic knee pain depends on the cause. The main treatments for chronic knee pain are: Doing exercises to help your knee move better and get stronger, called physical therapy. Losing weight if you are overweight. This condition may also be treated with medicines, injections, a knee sleeve or brace, and by using crutches. You health care provider may also recommend rest, ice, pressure (compression), and elevation, also called RICE therapy. Follow these instructions at home: If you have a knee sleeve or brace that can be taken off:  Wear the knee sleeve or brace as told by your provider. Take it off only if your provider says that you can. Check the skin around it every day. Tell your provider if you see problems. Loosen the knee sleeve or brace if your toes tingle, are numb, or turn cold and blue. Keep the knee sleeve or brace clean and dry. Bathing If the knee sleeve or brace is not waterproof: Do not let it get wet. Cover it when you take a bath or shower. Use a cover that does not let any water  in. Managing pain, stiffness, and swelling     If told, put heat on the area. Do this as often as told. Use the  heat source that your provider recommends, such as a moist heat pack or a heating pad. If you have a knee sleeve or brace that you can take off, remove it as told. Place a towel between your skin and the heat source. Leave the heat on for 20-30 minutes. If told, put ice on the area. If you have a knee sleeve or brace that you can take off, remove it as told. Put ice in a plastic bag. Place a towel between your skin and the bag. Leave the ice on for 20 minutes, 2-3 times a day. If your skin turns bright red, remove the ice or heat right away to prevent skin damage. The risk of damage is higher if you cannot feel pain, heat, or cold. Move your toes often to reduce stiffness and swelling. Raise the injured area above the level of your heart while you are sitting or lying down. Use a pillow to support your foot as needed. Activity Avoid activities where both feet leave the ground at the same time. Avoid running, jumping rope, and doing jumping jacks. Follow the exercise plan that your provider made for you. Your provider may suggest that you: Avoid activities that make knee pain worse. This may mean that you need to change your exercise routines, sports, or job duties. Wear shoes with cushioned soles. Avoid sports that require running and sudden changes in direction. Do physical therapy. Physical therapy helps your knee move better and get stronger. Exercise as told.  Do exercises that increase balance and strength, such as tai chi and yoga. Do not stand or walk on your injured knee until you're told it's okay. Use crutches as told. Return to normal activities when you're told. Ask what things are safe for you to do. General instructions Take your medicines only as told by your provider. If you are overweight, work with your provider and an expert in healthy eating called a dietitian to set goals to lose weight. Losing even a little weight can reduce knee pain. Being overweight can make your knee  hurt more. Do not smoke, vape, or use products with nicotine or tobacco in them. If you need help quitting, talk with your provider. Keep all follow-up visits. Your provider will monitor your pain and try other treatments if needed. Contact a health care provider if: You have knee pain that is not getting better or gets worse. You are not able to do your exercises due to knee pain. Get help right away if: Your knee swells and the swelling becomes worse. You cannot move your knee. You have severe knee pain. This information is not intended to replace advice given to you by your health care provider. Make sure you discuss any questions you have with your health care provider. Document Revised: 12/03/2022 Document Reviewed: 04/27/2022 Elsevier Patient Education  2024 [employer].

## 2023-11-08 NOTE — Progress Notes (Signed)
 Subjective:    Patient ID: Savannah Stanley, female    DOB: 1950-05-30, 73 y.o.   MRN: [id number]  Chief Complaint  Patient presents with   Medical Management of Chronic Issues    Patient presents today for a 2 month follow-up.   Quality Metric Gaps    Colonoscopy, lung cancer screening, foot & eye exam    HPI Discussed the use of AI scribe software for clinical note transcription with the patient, who gave verbal consent to proceed.  History of Present Illness Savannah Stanley is a 73 year old female with diabetes and osteoarthritis who presents for follow-up after a cortisone injection in her left knee.  She received a cortisone injection in her left knee last week due to osteoarthritis, described as 'bone on bone' in both knees. She is currently attending physical therapy for six weeks as part of her treatment plan.  She has a history of diabetes and does not regularly check her blood sugar levels. She has not noticed any significant symptoms such as increased thirst or urination. Her recent blood work showed an A1c increase from 7.4 to 7.7. She is currently on Jardiance  and Metformin  ER 500 mg once daily.  She has experienced weight loss, reporting a decrease from 213 pounds in 2017 to 180 pounds currently, attributed to lifestyle changes. She has a history of gastrointestinal side effects from Metformin , which improved after reducing the dose.  She reports difficulty sleeping, often waking up after five hours and unable to return to sleep. She takes magnesium  and Unisom to aid sleep but finds them insufficient. She occasionally naps during the day.  She has a history of a sty in her eye, which resolved with the use of expired drops. She does not have an eye doctor but is considering seeing one for regular check-ups.  She experiences swelling in her feet, which she attributes to the summer heat. She wears compression socks regularly and elevates her feet to manage the  swelling.  She has a cyst on her right elbow, for which she uses a compression sleeve.  She recently underwent a bladder scope, her first in 11 years, with no significant findings.    Past Medical History:  Diagnosis Date   Acute bronchitis with COPD (HCC) 03/19/2015   Bladder cancer (HCC) UROLOGIST-- DR MATILDA   HIGH-GRADE UROTHELIAL CARCINOMA   Bladder tumor multiple   some ORs; some removed in office   COPD (chronic obstructive pulmonary disease) (HCC)    Depression    Diabetes mellitus type 2 in obese 01/31/2015   GERD (gastroesophageal reflux disease)    H/O hiatal hernia    High cholesterol    History of blood transfusion 01/2013 - 02/2013   related to ORs   Hypertension    Iron deficiency anemia    Murmur, heart 05/20/2014   Pneumonia 07/2010; 08/07/2015   Type 2 diabetes mellitus [facility])     Past Surgical History:  Procedure Laterality Date   APPENDECTOMY  1980's   CYSTOSCOPY N/A 12/18/2013   Procedure: CYSTOSCOPY;  Surgeon: Garnette CHRISTELLA MATILDA, MD;  Location: [facility];  Service: Urology;  Laterality: N/A;   TRANSURETHRAL RESECTION OF BLADDER TUMOR N/A 02/16/2013   Procedure: TRANSURETHRAL RESECTION OF BLADDER TUMOR (TURBT);  Surgeon: Garnette MATILDA, MD;  Location: [place];  Service: Urology;  Laterality: N/A;   TRANSURETHRAL RESECTION OF BLADDER TUMOR N/A 12/18/2013   Procedure: TRANSURETHRAL RESECTION OF BLADDER TUMOR (TURBT) ;  Surgeon: Garnette  CHRISTELLA Shack, MD;  Location: [facility];  Service: Urology;  Laterality: N/A;   TRANSURETHRAL RESECTION OF BLADDER TUMOR WITH GYRUS (TURBT-GYRUS) N/A 01/23/2013   Procedure: TRANSURETHRAL RESECTION OF BLADDER TUMOR WITH GYRUS ;  Surgeon: Garnette Shack, MD;  Location: [facility];  Service: Urology;  Laterality: N/A;   TUBAL LIGATION  1980's    Family History  Problem Relation Age of Onset   Cancer Father        prostate   Arthritis Brother    Frontotemporal dementia  Brother    Dementia Brother    Cancer Daughter        breast    Social History   Socioeconomic History   Marital status: Widowed    Spouse name: Not on file   Number of children: Not on file   Years of education: Not on file   Highest education level: Some college, no degree  Occupational History   Not on file  Tobacco Use   Smoking status: Former    Current packs/day: 0.00    Average packs/day: 1 pack/day for 35.0 years (35.0 ttl pk-yrs)    Types: Cigarettes    Start date: 07/15/1975    Quit date: 07/15/2010    Years since quitting: 13.3   Smokeless tobacco: Never  Substance and Sexual Activity   Alcohol use: Yes    Comment: wine socially    Drug use: No   Sexual activity: Not on file  Other Topics Concern   Not on file  Social History Narrative   Not on file   Social Drivers of Health   Financial Resource Strain: Low Risk  (10/12/2023)   Overall Financial Resource Strain (CARDIA)    Difficulty of Paying Living Expenses: Not hard at all  Food Insecurity: No Food Insecurity (10/12/2023)   Hunger Vital Sign    Worried About Running Out of Food in the Last Year: Never true    Ran Out of Food in the Last Year: Never true  Transportation Needs: No Transportation Needs (10/12/2023)   PRAPARE - Administrator, Civil Service (Medical): No    Lack of Transportation (Non-Medical): No  Physical Activity: Insufficiently Active (10/12/2023)   Exercise Vital Sign    Days of Exercise per Week: 2 days    Minutes of Exercise per Session: 20 min  Stress: No Stress Concern Present (10/12/2023)   [employer] of Occupational Health - Occupational Stress Questionnaire    Feeling of Stress: Only a little  Social Connections: Moderately Isolated (10/12/2023)   Social Connection and Isolation Panel    Frequency of Communication with Friends and Family: More than three times a week    Frequency of Social Gatherings with Friends and Family: More than three times a week     Attends Religious Services: 1 to 4 times per year    Active Member of [employer] or Organizations: No    Attends Banker Meetings: Not on file    Marital Status: Widowed  Intimate Partner Violence: Not At Risk (10/12/2023)   Humiliation, Afraid, Rape, and Kick questionnaire    Fear of Current or Ex-Partner: No    Emotionally Abused: No    Physically Abused: No    Sexually Abused: No    Outpatient Medications Prior to Visit  Medication Sig Dispense Refill   albuterol  (VENTOLIN  HFA) 108 (90 Base) MCG/ACT inhaler Inhale 2 puffs into the lungs every 6 (six) hours as needed for wheezing or shortness of breath. Please  dispense brand Ventolin  or generic Ventolin  as these are preferred for 2025 by her [employer] plan. 18 g 2   Calcium Citrate-Vitamin D  (CITRACAL + D PO) Take 2 tablets by mouth 2 (two) times daily.      citalopram  (CELEXA ) 40 MG tablet Take 1 tablet by mouth once daily 90 tablet 1   Coenzyme Q10 (COQ10) 200 MG CAPS Take 1 each by mouth daily. gummies     doxylamine, Sleep, (UNISOM) 25 MG tablet Take 25 mg by mouth at bedtime as needed.     empagliflozin  (JARDIANCE ) 25 MG TABS tablet Take 1 tablet (25 mg total) by mouth daily before breakfast. 90 tablet 1   Ferrous Sulfate (SLOW FE PO) Take 1 tablet by mouth every other day.     fluticasone  (FLONASE ) 50 MCG/ACT nasal spray Place 2 sprays into both nostrils daily. 16 g 1   Fluticasone -Umeclidin-Vilant (TRELEGY ELLIPTA ) 100-62.5-25 MCG/ACT AEPB Inhale 1 puff into the lungs daily. 180 each 1   losartan  (COZAAR ) 50 MG tablet TAKE 1 TABLET BY MOUTH DAILY GENERIC EQUIVALENT FOR COZAAR  90 tablet 1   MAGNESIUM  GLYCINATE PLUS PO Take 400 mg by mouth at bedtime.     metFORMIN  (GLUCOPHAGE -XR) 500 MG 24 hr tablet Take 1 tablet (500 mg total) by mouth daily with breakfast. With largest meal.     metoprolol  succinate (TOPROL -XL) 25 MG 24 hr tablet TAKE 1 TABLET BY MOUTH DAILY GENERIC EQUIVALENT FOR TOPROL  XL 90 tablet 1   Multiple  Vitamins-Minerals (ZINC PO) Take 1 tablet by mouth daily.     omeprazole  (PRILOSEC) 20 MG capsule Take 1 capsule (20 mg total) by mouth 2 (two) times daily as needed. (Patient taking differently: Take 20 mg by mouth daily at 12 noon.) 180 capsule 0   pravastatin  (PRAVACHOL ) 10 MG tablet TAKE 1 TABLET BY MOUTH ONCE DAILY . APPOINTMENT REQUIRED FOR FUTURE REFILLS 90 tablet 0   Probiotic Product (PROBIOTIC PO) Take 1 capsule by mouth daily.     promethazine -dextromethorphan (PROMETHAZINE -DM) 6.25-15 MG/5ML syrup Take 5 mLs by mouth 4 (four) times daily as needed for cough. 118 mL 0   amoxicillin -clavulanate (AUGMENTIN ) 875-125 MG tablet Take 1 tablet by mouth 2 (two) times daily. (Patient not taking: Reported on 10/12/2023) 20 tablet 0   Facility-Administered Medications Prior to Visit  Medication Dose Route Frequency Provider Last Rate Last Admin   mitoMYcin  (MUTAMYCIN ) chemo injection 40 mg  40 mg Bladder Instillation Once Dahlstedt, Stephen, MD        Allergies  Allergen Reactions   Bee Venom Shortness Of Breath   Other Shortness Of Breath    MSG.   Latex Itching    Review of Systems  Constitutional:  Negative for fever and malaise/fatigue.  HENT:  Negative for congestion.   Eyes:  Negative for blurred vision.  Respiratory:  Negative for shortness of breath.   Cardiovascular:  Negative for chest pain, palpitations and leg swelling.  Gastrointestinal:  Negative for abdominal pain, blood in stool and nausea.  Genitourinary:  Negative for dysuria and frequency.  Musculoskeletal:  Positive for joint pain. Negative for falls.  Skin:  Negative for rash.  Neurological:  Negative for dizziness, loss of consciousness and headaches.  Endo/Heme/Allergies:  Negative for environmental allergies.  Psychiatric/Behavioral:  Negative for depression. The patient is not nervous/anxious.        Objective:    Physical Exam Constitutional:      General: She is not in acute distress.    Appearance:  Normal appearance.  She is well-developed. She is not toxic-appearing.  HENT:     Head: Normocephalic and atraumatic.     Right Ear: External ear normal.     Left Ear: External ear normal.     Nose: Nose normal.  Eyes:     General:        Right eye: No discharge.        Left eye: No discharge.     Conjunctiva/sclera: Conjunctivae normal.  Neck:     Thyroid : No thyromegaly.  Cardiovascular:     Rate and Rhythm: Normal rate and regular rhythm.     Heart sounds: No murmur heard. Pulmonary:     Effort: Pulmonary effort is normal. No respiratory distress.     Breath sounds: Normal breath sounds.  Abdominal:     General: Bowel sounds are normal.     Palpations: Abdomen is soft.     Tenderness: There is no abdominal tenderness. There is no guarding.  Musculoskeletal:        General: Normal range of motion.     Cervical back: Neck supple.  Lymphadenopathy:     Cervical: No cervical adenopathy.  Skin:    General: Skin is warm and dry.  Neurological:     Mental Status: She is alert and oriented to person, place, and time.  Psychiatric:        Mood and Affect: Mood normal.        Behavior: Behavior normal.        Thought Content: Thought content normal.        Judgment: Judgment normal.     BP 126/72   Pulse 80   Resp 16   Ht 5' 3 (1.6 m)   Wt 180 lb 12.8 oz (82 kg)   SpO2 96%   BMI 32.03 kg/m  Wt Readings from Last 3 Encounters:  11/08/23 180 lb 12.8 oz (82 kg)  10/12/23 184 lb (83.5 kg)  06/02/23 184 lb (83.5 kg)    Diabetic Foot Exam - Simple   No data filed    Lab Results  Component Value Date   WBC 10.3 11/04/2023   HGB 16.0 (H) 11/04/2023   HCT 49.0 (H) 11/04/2023   PLT 250.0 11/04/2023   GLUCOSE 92 11/04/2023   CHOL 184 11/04/2023   TRIG 308.0 (H) 11/04/2023   HDL 60.00 11/04/2023   LDLCALC 62 11/04/2023   ALT 11 11/04/2023   AST 13 11/04/2023   NA 139 11/04/2023   K 4.1 11/04/2023   CL 101 11/04/2023   CREATININE 0.63 11/04/2023   BUN 20  11/04/2023   CO2 26 11/04/2023   TSH 1.61 11/04/2023   INR 1.04 01/18/2013   HGBA1C 7.7 (H) 11/04/2023   MICROALBUR <0.7 11/04/2023    Lab Results  Component Value Date   TSH 1.61 11/04/2023   Lab Results  Component Value Date   WBC 10.3 11/04/2023   HGB 16.0 (H) 11/04/2023   HCT 49.0 (H) 11/04/2023   MCV 95.4 11/04/2023   PLT 250.0 11/04/2023   Lab Results  Component Value Date   NA 139 11/04/2023   K 4.1 11/04/2023   CO2 26 11/04/2023   GLUCOSE 92 11/04/2023   BUN 20 11/04/2023   CREATININE 0.63 11/04/2023   BILITOT 0.4 11/04/2023   ALKPHOS 47 11/04/2023   AST 13 11/04/2023   ALT 11 11/04/2023   PROT 6.5 11/04/2023   ALBUMIN 4.4 11/04/2023   CALCIUM 9.1 11/04/2023   ANIONGAP 9 08/09/2015   GFR  86.92 11/04/2023   Lab Results  Component Value Date   CHOL 184 11/04/2023   Lab Results  Component Value Date   HDL 60.00 11/04/2023   Lab Results  Component Value Date   LDLCALC 62 11/04/2023   Lab Results  Component Value Date   TRIG 308.0 (H) 11/04/2023   Lab Results  Component Value Date   CHOLHDL 3 11/04/2023   Lab Results  Component Value Date   HGBA1C 7.7 (H) 11/04/2023       Assessment & Plan:  Mixed hyperlipidemia Assessment & Plan: Encourage heart healthy diet such as MIND or DASH diet, increase exercise, avoid trans fats, simple carbohydrates and processed foods, consider a krill or fish or flaxseed oil cap daily. Tolerating Pravastatin    Chronic obstructive pulmonary disease with acute exacerbation (HCC) Assessment & Plan: doing well presently   Low vitamin D  level Assessment & Plan: Supplement and monitor    Primary hypertension Assessment & Plan: Well controlled, no changes to meds. Encouraged heart healthy diet such as the DASH diet and exercise as tolerated.    Type 2 diabetes mellitus without complication, without long-term current use of insulin  (HCC) Assessment & Plan: hgba1c acceptable, minimize simple carbs. Increase  exercise as tolerated. Continue current meds      Assessment and Plan Assessment & Plan Type 2 diabetes mellitus A1c increased from 7.4 to 7.7, likely influenced by recent corticosteroid injection. She has lost 20 pounds over the last few years, which is positive for diabetes management. Discussed potential benefits of GLP-1 receptor agonists like Ozempic or Mounjaro for weight loss and metabolic control, but she prefers not to start these medications at this time. - Continue current diabetes medications including Empagliflozin  and Metformin . - Recheck A1c in 3 months. - Encourage dietary modifications, particularly reducing carbohydrates for a week post-corticosteroid injection to manage blood sugar levels.  Chronic bilateral knee osteoarthritis, status post left knee corticosteroid injection Received a corticosteroid injection in the left knee. Right knee injection planned for the end of the month. Corticosteroid injections can spike blood sugar levels, hence the staggered approach. She is attending physical therapy for six weeks as recommended by the orthopedist. - Proceed with right knee corticosteroid injection at the end of the month. - Continue physical therapy for six weeks.  Chronic obstructive pulmonary disease (COPD) Baseline symptoms with no increase in cough or shortness of breath. Left lung sounds worse than the right. Uses Trelegy daily and ProAir  as needed. Discussed using ProAir  daily for a week to see if it improves lung function. - Use ProAir  inhaler daily for one week and perform deep breathing exercises. - Monitor for any increase in symptoms such as fever or increased sputum production.  Chronic right elbow swelling with cyst Right elbow swelling with a cyst. Advised against needle aspiration to avoid infection. Current compression sleeve may be too tight. - Try a larger compression sleeve for the right elbow.  Insomnia Difficulty staying asleep, often waking up to  use the bathroom and unable to return to sleep. Currently using magnesium  and Unisom. Discussed cognitive behavioral therapy for insomnia (CBTI) and a mental exercise to aid sleep. - Try CBTI app for sleep improvement. - Consider mental exercise of thinking of words starting with each letter of a chosen word to aid sleep.  General Health Maintenance Discussed the importance of regular eye exams due to increased risk of diabetic retinopathy and other age-related eye conditions. She does not currently have an eye doctor but is open to  a referral. Declined colonoscopy and lung cancer screening at this time. - Refer to ophthalmology for eye exam, patient to call with preference. - Encourage regular eye exams to monitor for diabetic retinopathy and other conditions.  Recording duration: 30 minutes     Harlene Horton, MD

## 2023-11-12 DIAGNOSIS — M25562 Pain in left knee: Secondary | ICD-10-CM | POA: Diagnosis not present

## 2023-11-12 DIAGNOSIS — M25561 Pain in right knee: Secondary | ICD-10-CM | POA: Diagnosis not present

## 2023-11-12 DIAGNOSIS — G8929 Other chronic pain: Secondary | ICD-10-CM | POA: Diagnosis not present

## 2023-11-14 ENCOUNTER — Other Ambulatory Visit: Payer: Self-pay | Admitting: Family Medicine

## 2023-11-14 DIAGNOSIS — I10 Essential (primary) hypertension: Secondary | ICD-10-CM

## 2023-11-18 DIAGNOSIS — M25561 Pain in right knee: Secondary | ICD-10-CM | POA: Diagnosis not present

## 2023-11-18 DIAGNOSIS — M25562 Pain in left knee: Secondary | ICD-10-CM | POA: Diagnosis not present

## 2023-11-18 DIAGNOSIS — G8929 Other chronic pain: Secondary | ICD-10-CM | POA: Diagnosis not present

## 2023-11-23 DIAGNOSIS — M25561 Pain in right knee: Secondary | ICD-10-CM | POA: Diagnosis not present

## 2023-11-23 DIAGNOSIS — M25562 Pain in left knee: Secondary | ICD-10-CM | POA: Diagnosis not present

## 2023-11-23 DIAGNOSIS — G8929 Other chronic pain: Secondary | ICD-10-CM | POA: Diagnosis not present

## 2023-11-25 DIAGNOSIS — G8929 Other chronic pain: Secondary | ICD-10-CM | POA: Diagnosis not present

## 2023-11-25 DIAGNOSIS — M25562 Pain in left knee: Secondary | ICD-10-CM | POA: Diagnosis not present

## 2023-11-25 DIAGNOSIS — M25561 Pain in right knee: Secondary | ICD-10-CM | POA: Diagnosis not present

## 2023-12-02 DIAGNOSIS — M25561 Pain in right knee: Secondary | ICD-10-CM | POA: Diagnosis not present

## 2023-12-02 DIAGNOSIS — G8929 Other chronic pain: Secondary | ICD-10-CM | POA: Diagnosis not present

## 2023-12-02 DIAGNOSIS — M25562 Pain in left knee: Secondary | ICD-10-CM | POA: Diagnosis not present

## 2023-12-07 DIAGNOSIS — M25561 Pain in right knee: Secondary | ICD-10-CM | POA: Diagnosis not present

## 2023-12-07 DIAGNOSIS — G8929 Other chronic pain: Secondary | ICD-10-CM | POA: Diagnosis not present

## 2023-12-07 DIAGNOSIS — M25562 Pain in left knee: Secondary | ICD-10-CM | POA: Diagnosis not present

## 2023-12-09 DIAGNOSIS — M25562 Pain in left knee: Secondary | ICD-10-CM | POA: Diagnosis not present

## 2023-12-09 DIAGNOSIS — G8929 Other chronic pain: Secondary | ICD-10-CM | POA: Diagnosis not present

## 2023-12-09 DIAGNOSIS — M25561 Pain in right knee: Secondary | ICD-10-CM | POA: Diagnosis not present

## 2023-12-14 DIAGNOSIS — M25562 Pain in left knee: Secondary | ICD-10-CM | POA: Diagnosis not present

## 2023-12-14 DIAGNOSIS — M25561 Pain in right knee: Secondary | ICD-10-CM | POA: Diagnosis not present

## 2023-12-15 NOTE — Progress Notes (Signed)
 Savannah Stanley                                          MRN: [id number]   12/15/2023   The [facility] Quality Team Specialist reviewed this patient medical record for the purposes of chart review for care gap closure. The following were reviewed: abstraction for care gap closure-kidney health evaluation for diabetes:eGFR  and uACR.    [facility] Quality Team

## 2024-01-07 NOTE — Progress Notes (Addendum)
 Savannah Stanley                                          MRN: [id number]   01/07/2024   The [facility] Quality Team Specialist reviewed this patient medical record for the purposes of chart review for care gap closure. The following were reviewed: chart review for care gap closure-colorectal cancer screening and diabetic eye exam.  04/04/2024- could not close eed    [facility] Quality Team

## 2024-01-30 ENCOUNTER — Other Ambulatory Visit: Payer: Self-pay | Admitting: Family Medicine

## 2024-01-30 DIAGNOSIS — E782 Mixed hyperlipidemia: Secondary | ICD-10-CM

## 2024-02-14 ENCOUNTER — Other Ambulatory Visit: Payer: Self-pay | Admitting: Family Medicine

## 2024-02-14 DIAGNOSIS — F32A Depression, unspecified: Secondary | ICD-10-CM

## 2024-02-18 ENCOUNTER — Other Ambulatory Visit: Payer: Self-pay | Admitting: Family Medicine

## 2024-02-29 DIAGNOSIS — M25562 Pain in left knee: Secondary | ICD-10-CM | POA: Diagnosis not present

## 2024-02-29 DIAGNOSIS — M25561 Pain in right knee: Secondary | ICD-10-CM | POA: Diagnosis not present

## 2024-02-29 DIAGNOSIS — G8929 Other chronic pain: Secondary | ICD-10-CM | POA: Diagnosis not present

## 2024-03-10 ENCOUNTER — Telehealth: Payer: Self-pay

## 2024-03-10 DIAGNOSIS — K219 Gastro-esophageal reflux disease without esophagitis: Secondary | ICD-10-CM

## 2024-03-10 MED ORDER — OMEPRAZOLE 20 MG PO CPDR: 20.0000 mg | DELAYED_RELEASE_CAPSULE | Freq: Two times a day (BID) | ORAL | 0 refills | Status: AC | PRN

## 2024-03-10 NOTE — Telephone Encounter (Signed)
 Pharmacy send a rx request for Omeprazole  20 MG.

## 2024-03-27 ENCOUNTER — Other Ambulatory Visit

## 2024-03-27 ENCOUNTER — Ambulatory Visit: Payer: Self-pay | Admitting: Family Medicine

## 2024-03-27 DIAGNOSIS — R7989 Other specified abnormal findings of blood chemistry: Secondary | ICD-10-CM

## 2024-03-27 DIAGNOSIS — I10 Essential (primary) hypertension: Secondary | ICD-10-CM | POA: Diagnosis not present

## 2024-03-27 DIAGNOSIS — E119 Type 2 diabetes mellitus without complications: Secondary | ICD-10-CM | POA: Diagnosis not present

## 2024-03-27 DIAGNOSIS — E782 Mixed hyperlipidemia: Secondary | ICD-10-CM

## 2024-03-27 LAB — COMPREHENSIVE METABOLIC PANEL WITH GFR
ALT: 11 U/L (ref 3–35)
AST: 13 U/L (ref 5–37)
Albumin: 4.4 g/dL (ref 3.5–5.2)
Alkaline Phosphatase: 50 U/L (ref 39–117)
BUN: 16 mg/dL (ref 6–23)
CO2: 31 meq/L (ref 19–32)
Calcium: 9.3 mg/dL (ref 8.4–10.5)
Chloride: 99 meq/L (ref 96–112)
Creatinine, Ser: 0.68 mg/dL (ref 0.40–1.20)
GFR: 85.1 mL/min
Glucose, Bld: 111 mg/dL — ABNORMAL HIGH (ref 70–99)
Potassium: 4.1 meq/L (ref 3.5–5.1)
Sodium: 139 meq/L (ref 135–145)
Total Bilirubin: 0.4 mg/dL (ref 0.2–1.2)
Total Protein: 6.5 g/dL (ref 6.0–8.3)

## 2024-03-27 LAB — LIPID PANEL
Cholesterol: 201 mg/dL — ABNORMAL HIGH (ref 28–200)
HDL: 60.2 mg/dL
LDL Cholesterol: 103 mg/dL — ABNORMAL HIGH (ref 10–99)
NonHDL: 140.94
Total CHOL/HDL Ratio: 3
Triglycerides: 191 mg/dL — ABNORMAL HIGH (ref 10.0–149.0)
VLDL: 38.2 mg/dL (ref 0.0–40.0)

## 2024-03-27 LAB — CBC WITH DIFFERENTIAL/PLATELET
Basophils Absolute: 0 K/uL (ref 0.0–0.1)
Basophils Relative: 0.5 % (ref 0.0–3.0)
Eosinophils Absolute: 0.1 K/uL (ref 0.0–0.7)
Eosinophils Relative: 1.2 % (ref 0.0–5.0)
HCT: 49.1 % — ABNORMAL HIGH (ref 36.0–46.0)
Hemoglobin: 16.4 g/dL — ABNORMAL HIGH (ref 12.0–15.0)
Lymphocytes Relative: 20 % (ref 12.0–46.0)
Lymphs Abs: 1.7 K/uL (ref 0.7–4.0)
MCHC: 33.5 g/dL (ref 30.0–36.0)
MCV: 96.7 fl (ref 78.0–100.0)
Monocytes Absolute: 0.7 K/uL (ref 0.1–1.0)
Monocytes Relative: 8.1 % (ref 3.0–12.0)
Neutro Abs: 6.1 K/uL (ref 1.4–7.7)
Neutrophils Relative %: 70.2 % (ref 43.0–77.0)
Platelets: 219 K/uL (ref 150.0–400.0)
RBC: 5.07 Mil/uL (ref 3.87–5.11)
RDW: 13.1 % (ref 11.5–15.5)
WBC: 8.7 K/uL (ref 4.0–10.5)

## 2024-03-27 LAB — HEMOGLOBIN A1C: Hgb A1c MFr Bld: 7.5 % — ABNORMAL HIGH (ref 4.6–6.5)

## 2024-03-27 LAB — VITAMIN D 25 HYDROXY (VIT D DEFICIENCY, FRACTURES): VITD: 51.49 ng/mL (ref 30.00–100.00)

## 2024-03-27 LAB — TSH: TSH: 1.13 u[IU]/mL (ref 0.35–5.50)

## 2024-04-01 NOTE — Assessment & Plan Note (Signed)
 hgba1c acceptable, minimize simple carbs. Increase exercise as tolerated. Continue current meds

## 2024-04-01 NOTE — Assessment & Plan Note (Signed)
 doing well presently

## 2024-04-01 NOTE — Assessment & Plan Note (Signed)
 Supplement and monitor

## 2024-04-01 NOTE — Assessment & Plan Note (Signed)
 Encourage heart healthy diet such as MIND or DASH diet, increase exercise, avoid trans fats, simple carbohydrates and processed foods, consider a krill or fish or flaxseed oil cap daily. Tolerating Pravastatin 

## 2024-04-01 NOTE — Assessment & Plan Note (Signed)
 Well controlled, no changes to meds. Encouraged heart healthy diet such as the DASH diet and exercise as tolerated.

## 2024-04-01 NOTE — Progress Notes (Unsigned)
 "  Subjective:    Patient ID: Savannah Stanley, female    DOB: [birth year], 74 y.o.   MRN: [id number]  No chief complaint on file.   HPI Discussed the use of AI scribe software for clinical note transcription with the patient, who gave verbal consent to proceed.  History of Present Illness     Past Medical History:  Diagnosis Date   Acute bronchitis with COPD (HCC) 03/19/2015   Bladder cancer (HCC) UROLOGIST-- DR MATILDA   HIGH-GRADE UROTHELIAL CARCINOMA   Bladder tumor multiple   some ORs; some removed in office   COPD (chronic obstructive pulmonary disease) (HCC)    Depression    Diabetes mellitus type 2 in obese 01/31/2015   GERD (gastroesophageal reflux disease)    H/O hiatal hernia    High cholesterol    History of blood transfusion 01/2013 - 02/2013   related to ORs   Hypertension    Iron deficiency anemia    Murmur, heart 05/20/2014   Pneumonia 07/2010; 08/07/2015   Type 2 diabetes mellitus [facility])     Past Surgical History:  Procedure Laterality Date   APPENDECTOMY  1980's   CYSTOSCOPY N/A 12/18/2013   Procedure: CYSTOSCOPY;  Surgeon: Garnette CHRISTELLA Matilda, MD;  Location: [facility];  Service: Urology;  Laterality: N/A;   TRANSURETHRAL RESECTION OF BLADDER TUMOR N/A 02/16/2013   Procedure: TRANSURETHRAL RESECTION OF BLADDER TUMOR (TURBT);  Surgeon: Garnette Matilda, MD;  Location: [facility];  Service: Urology;  Laterality: N/A;   TRANSURETHRAL RESECTION OF BLADDER TUMOR N/A 12/18/2013   Procedure: TRANSURETHRAL RESECTION OF BLADDER TUMOR (TURBT) ;  Surgeon: Garnette CHRISTELLA Matilda, MD;  Location: [facility];  Service: Urology;  Laterality: N/A;   TRANSURETHRAL RESECTION OF BLADDER TUMOR WITH GYRUS (TURBT-GYRUS) N/A 01/23/2013   Procedure: TRANSURETHRAL RESECTION OF BLADDER TUMOR WITH GYRUS ;  Surgeon: Garnette Matilda, MD;  Location: [facility];  Service: Urology;  Laterality: N/A;   TUBAL LIGATION  1980's     Family History  Problem Relation Age of Onset   Cancer Father        prostate   Arthritis Brother    Frontotemporal dementia Brother    Dementia Brother    Cancer Daughter        breast    Social History   Socioeconomic History   Marital status: Widowed    Spouse name: Not on file   Number of children: Not on file   Years of education: Not on file   Highest education level: Associate degree: occupational, scientist, product/process development, or vocational program  Occupational History   Not on file  Tobacco Use   Smoking status: Former    Current packs/day: 0.00    Average packs/day: 1 pack/day for 35.0 years (35.0 ttl pk-yrs)    Types: Cigarettes    Start date: 07/15/1975    Quit date: 07/15/2010    Years since quitting: 13.7   Smokeless tobacco: Never  Substance and Sexual Activity   Alcohol use: Yes    Comment: wine socially    Drug use: No   Sexual activity: Not on file  Other Topics Concern   Not on file  Social History Narrative   Not on file   Social Drivers of Health   Tobacco Use: Medium Risk (11/08/2023)   Patient History    Smoking Tobacco Use: Former    Smokeless Tobacco Use: Never    Passive Exposure: Not on file  Financial Resource Strain: Low Risk (03/27/2024)  Overall Financial Resource Strain (CARDIA)    Difficulty of Paying Living Expenses: Not very hard  Food Insecurity: No Food Insecurity (03/27/2024)   Epic    Worried About Programme Researcher, Broadcasting/film/video in the Last Year: Never true    Ran Out of Food in the Last Year: Never true  Transportation Needs: No Transportation Needs (03/27/2024)   Epic    Lack of Transportation (Medical): No    Lack of Transportation (Non-Medical): No  Physical Activity: Unknown (03/27/2024)   Exercise Vital Sign    Days of Exercise per Week: 2 days    Minutes of Exercise per Session: Patient declined  Stress: No Stress Concern Present (03/27/2024)   [employer] of Occupational Health - Occupational Stress  Questionnaire    Feeling of Stress: Only a little  Social Connections: Socially Isolated (03/27/2024)   Social Connection and Isolation Panel    Frequency of Communication with Friends and Family: More than three times a week    Frequency of Social Gatherings with Friends and Family: More than three times a week    Attends Religious Services: Patient declined    Database Administrator or Organizations: No    Attends Banker Meetings: Not on file    Marital Status: Widowed  Intimate Partner Violence: Not At Risk (10/12/2023)   Epic    Fear of Current or Ex-Partner: No    Emotionally Abused: No    Physically Abused: No    Sexually Abused: No  Depression (PHQ2-9): Low Risk (11/08/2023)   Depression (PHQ2-9)    PHQ-2 Score: 1  Alcohol Screen: Low Risk (03/27/2024)   Alcohol Screen    Last Alcohol Screening Score (AUDIT): 1  Housing: Low Risk (03/27/2024)   Epic    Unable to Pay for Housing in the Last Year: No    Number of Times Moved in the Last Year: 0    Homeless in the Last Year: No  Utilities: Not At Risk (10/12/2023)   Epic    Threatened with loss of utilities: No  Health Literacy: Adequate Health Literacy (10/12/2023)   B1300 Health Literacy    Frequency of need for help with medical instructions: Never    Outpatient Medications Prior to Visit  Medication Sig Dispense Refill   Calcium Citrate-Vitamin D  (CITRACAL + D PO) Take 2 tablets by mouth 2 (two) times daily.      citalopram  (CELEXA ) 40 MG tablet Take 1 tablet by mouth once daily 90 tablet 0   Coenzyme Q10 (COQ10) 200 MG CAPS Take 1 each by mouth daily. gummies     doxylamine, Sleep, (UNISOM) 25 MG tablet Take 25 mg by mouth at bedtime as needed.     Ferrous Sulfate (SLOW FE PO) Take 1 tablet by mouth every other day.     fluticasone  (FLONASE ) 50 MCG/ACT nasal spray Place 2 sprays into both nostrils daily. 16 g 1   Fluticasone -Umeclidin-Vilant (TRELEGY ELLIPTA ) 100-62.5-25 MCG/ACT AEPB  Inhale 1 puff into the lungs daily. 180 each 1   JARDIANCE  25 MG TABS tablet TAKE 1 TABLET BY MOUTH ONCE DAILY BEFORE BREAKFAST 90 tablet 0   losartan  (COZAAR ) 50 MG tablet Take 1 tablet (50 mg total) by mouth daily. 90 tablet 1   MAGNESIUM  GLYCINATE PLUS PO Take 400 mg by mouth at bedtime.     metFORMIN  (GLUCOPHAGE -XR) 500 MG 24 hr tablet Take 1 tablet (500 mg total) by mouth daily with breakfast. With largest meal.     metoprolol  succinate (TOPROL -XL)  25 MG 24 hr tablet Take 1 tablet (25 mg total) by mouth daily. Take with or immediately following a meal 90 tablet 1   Multiple Vitamins-Minerals (ZINC PO) Take 1 tablet by mouth daily.     omeprazole  (PRILOSEC) 20 MG capsule Take 1 capsule (20 mg total) by mouth 2 (two) times daily as needed. 180 capsule 0   pravastatin  (PRAVACHOL ) 10 MG tablet TAKE 1 TABLET BY MOUTH ONCE DAILY . APPOINTMENT REQUIRED FOR FUTURE REFILLS 90 tablet 0   Probiotic Product (PROBIOTIC PO) Take 1 capsule by mouth daily.     promethazine -dextromethorphan (PROMETHAZINE -DM) 6.25-15 MG/5ML syrup Take 5 mLs by mouth 4 (four) times daily as needed for cough. 118 mL 0   VENTOLIN  HFA 108 (90 Base) MCG/ACT inhaler INHALE 2 PUFFS BY MOUTH  EVERY 6 HOURS AS NEEDED FOR WHEEZING OR SHORTNESS OF BREATH 18 g 2   Facility-Administered Medications Prior to Visit  Medication Dose Route Frequency Provider Last Rate Last Admin   mitoMYcin  (MUTAMYCIN ) chemo injection 40 mg  40 mg Bladder Instillation Once Dahlstedt, Stephen, MD        Allergies[1]  Review of Systems  Constitutional:  Negative for fever and malaise/fatigue.  HENT:  Negative for congestion.   Eyes:  Negative for blurred vision.  Respiratory:  Negative for shortness of breath.   Cardiovascular:  Negative for chest pain, palpitations and leg swelling.  Gastrointestinal:  Negative for abdominal pain, blood in stool and nausea.  Genitourinary:  Negative for dysuria and frequency.  Musculoskeletal:  Negative for  falls.  Skin:  Negative for rash.  Neurological:  Negative for dizziness, loss of consciousness and headaches.  Endo/Heme/Allergies:  Negative for environmental allergies.  Psychiatric/Behavioral:  Negative for depression. The patient is not nervous/anxious.        Objective:    Physical Exam Constitutional:      General: She is not in acute distress.    Appearance: Normal appearance. She is well-developed. She is not toxic-appearing.  HENT:     Head: Normocephalic and atraumatic.     Right Ear: External ear normal.     Left Ear: External ear normal.     Nose: Nose normal.  Eyes:     General:        Right eye: No discharge.        Left eye: No discharge.     Conjunctiva/sclera: Conjunctivae normal.  Neck:     Thyroid : No thyromegaly.  Cardiovascular:     Rate and Rhythm: Normal rate and regular rhythm.     Heart sounds: Normal heart sounds. No murmur heard. Pulmonary:     Effort: Pulmonary effort is normal. No respiratory distress.     Breath sounds: Normal breath sounds.  Abdominal:     General: Bowel sounds are normal.     Palpations: Abdomen is soft.     Tenderness: There is no abdominal tenderness. There is no guarding.  Musculoskeletal:        General: Normal range of motion.     Cervical back: Neck supple.  Lymphadenopathy:     Cervical: No cervical adenopathy.  Skin:    General: Skin is warm and dry.  Neurological:     Mental Status: She is alert and oriented to person, place, and time.  Psychiatric:        Mood and Affect: Mood normal.        Behavior: Behavior normal.        Thought Content: Thought content normal.  Judgment: Judgment normal.    There were no vitals taken for this visit. Wt Readings from Last 3 Encounters:  11/08/23 180 lb 12.8 oz (82 kg)  10/12/23 184 lb (83.5 kg)  06/02/23 184 lb (83.5 kg)    Diabetic Foot Exam - Simple   No data filed    Lab Results  Component Value Date   WBC 8.7 03/27/2024   HGB 16.4 (H) 03/27/2024    HCT 49.1 (H) 03/27/2024   PLT 219.0 03/27/2024   GLUCOSE 111 (H) 03/27/2024   CHOL 201 (H) 03/27/2024   TRIG 191.0 (H) 03/27/2024   HDL 60.20 03/27/2024   LDLCALC 103 (H) 03/27/2024   ALT 11 03/27/2024   AST 13 03/27/2024   NA 139 03/27/2024   K 4.1 03/27/2024   CL 99 03/27/2024   CREATININE 0.68 03/27/2024   BUN 16 03/27/2024   CO2 31 03/27/2024   TSH 1.13 03/27/2024   INR 1.04 01/18/2013   HGBA1C 7.5 (H) 03/27/2024   MICROALBUR <0.7 11/04/2023    Lab Results  Component Value Date   TSH 1.13 03/27/2024   Lab Results  Component Value Date   WBC 8.7 03/27/2024   HGB 16.4 (H) 03/27/2024   HCT 49.1 (H) 03/27/2024   MCV 96.7 03/27/2024   PLT 219.0 03/27/2024   Lab Results  Component Value Date   NA 139 03/27/2024   K 4.1 03/27/2024   CO2 31 03/27/2024   GLUCOSE 111 (H) 03/27/2024   BUN 16 03/27/2024   CREATININE 0.68 03/27/2024   BILITOT 0.4 03/27/2024   ALKPHOS 50 03/27/2024   AST 13 03/27/2024   ALT 11 03/27/2024   PROT 6.5 03/27/2024   ALBUMIN 4.4 03/27/2024   CALCIUM 9.3 03/27/2024   ANIONGAP 9 08/09/2015   GFR 85.10 03/27/2024   Lab Results  Component Value Date   CHOL 201 (H) 03/27/2024   Lab Results  Component Value Date   HDL 60.20 03/27/2024   Lab Results  Component Value Date   LDLCALC 103 (H) 03/27/2024   Lab Results  Component Value Date   TRIG 191.0 (H) 03/27/2024   Lab Results  Component Value Date   CHOLHDL 3 03/27/2024   Lab Results  Component Value Date   HGBA1C 7.5 (H) 03/27/2024       Assessment & Plan:  Type 2 diabetes mellitus without complication, without long-term current use of insulin  (HCC) Assessment & Plan: hgba1c acceptable, minimize simple carbs. Increase exercise as tolerated. Continue current meds    Low vitamin D  level Assessment & Plan: Supplement and monitor    Primary hypertension Assessment & Plan: Well controlled, no changes to meds. Encouraged heart healthy diet such as the DASH diet and  exercise as tolerated.    Mixed hyperlipidemia Assessment & Plan: Encourage heart healthy diet such as MIND or DASH diet, increase exercise, avoid trans fats, simple carbohydrates and processed foods, consider a krill or fish or flaxseed oil cap daily. Tolerating Pravastatin    Chronic obstructive pulmonary disease with acute exacerbation (HCC) Assessment & Plan: doing well presently     Assessment and Plan Assessment & Plan      Harlene Horton, MD     [1] Allergies Allergen Reactions   Bee Venom Shortness Of Breath   Other Shortness Of Breath    MSG.   Latex Itching  "

## 2024-04-03 ENCOUNTER — Ambulatory Visit: Admitting: Family Medicine

## 2024-04-03 ENCOUNTER — Encounter: Payer: Self-pay | Admitting: Family Medicine

## 2024-04-03 VITALS — BP 128/70 | HR 68 | Temp 97.9°F | Resp 16 | Ht 63.0 in | Wt 187.2 lb

## 2024-04-03 DIAGNOSIS — E782 Mixed hyperlipidemia: Secondary | ICD-10-CM

## 2024-04-03 DIAGNOSIS — C679 Malignant neoplasm of bladder, unspecified: Secondary | ICD-10-CM | POA: Diagnosis not present

## 2024-04-03 DIAGNOSIS — I10 Essential (primary) hypertension: Secondary | ICD-10-CM

## 2024-04-03 DIAGNOSIS — E119 Type 2 diabetes mellitus without complications: Secondary | ICD-10-CM

## 2024-04-03 DIAGNOSIS — J441 Chronic obstructive pulmonary disease with (acute) exacerbation: Secondary | ICD-10-CM

## 2024-04-03 DIAGNOSIS — Z7984 Long term (current) use of oral hypoglycemic drugs: Secondary | ICD-10-CM

## 2024-04-03 DIAGNOSIS — R7989 Other specified abnormal findings of blood chemistry: Secondary | ICD-10-CM | POA: Diagnosis not present

## 2024-04-03 NOTE — Patient Instructions (Addendum)
 CBTinsomnia pension scheme manager) is an APP written by the [employer] for sleep  L Tryptophan capsules for sleep, over the counter. This is the Amino Acid in [place] that makes us  sleepy  You need Prevnar 20 vaccine   Insomnia Insomnia is a sleep disorder that makes it difficult to fall asleep or stay asleep. Insomnia can cause fatigue, low energy, difficulty concentrating, mood swings, and poor performance at work or school. There are three different ways to classify insomnia: Difficulty falling asleep. Difficulty staying asleep. Waking up too early in the morning. Any type of insomnia can be long-term (chronic) or short-term (acute). Both are common. Short-term insomnia usually lasts for 3 months or less. Chronic insomnia occurs at least three times a week for longer than 3 months. What are the causes? Insomnia may be caused by another condition, situation, or substance, such as: Having certain mental health conditions, such as anxiety and depression. Using caffeine, alcohol, tobacco, or drugs. Having gastrointestinal conditions, such as gastroesophageal reflux disease (GERD). Having certain medical conditions. These include: Asthma. Alzheimer's disease. Stroke. Chronic pain. An overactive thyroid  gland (hyperthyroidism). Other sleep disorders, such as restless legs syndrome and sleep apnea. Menopause. Sometimes, the cause of insomnia may not be known. What increases the risk? Risk factors for insomnia include: Gender. Females are affected more often than males. Age. Insomnia is more common as people get older. Stress and certain medical and mental health conditions. Lack of exercise. Having an irregular work schedule. This may include working night shifts and traveling between different time zones. What are the signs or symptoms? If you have insomnia, the main symptom is having trouble falling asleep or having trouble staying asleep. This may lead to other symptoms, such  as: Feeling tired or having low energy. Feeling nervous about going to sleep. Not feeling rested in the morning. Having trouble concentrating. Feeling irritable, anxious, or depressed. How is this diagnosed? This condition may be diagnosed based on: Your symptoms and medical history. Your health care provider may ask about: Your sleep habits. Any medical conditions you have. Your mental health. A physical exam. How is this treated? Treatment for insomnia depends on the cause. Treatment may focus on treating an underlying condition that is causing the insomnia. Treatment may also include: Medicines to help you sleep. Counseling or therapy. Lifestyle adjustments to help you sleep better. Follow these instructions at home: Eating and drinking  Limit or avoid alcohol, caffeinated beverages, and products that contain nicotine and tobacco, especially close to bedtime. These can disrupt your sleep. Do not eat a large meal or eat spicy foods right before bedtime. This can lead to digestive discomfort that can make it hard for you to sleep. Sleep habits  Keep a sleep diary to help you and your health care provider figure out what could be causing your insomnia. Write down: When you sleep. When you wake up during the night. How well you sleep and how rested you feel the next day. Any side effects of medicines you are taking. What you eat and drink. Make your bedroom a dark, comfortable place where it is easy to fall asleep. Put up shades or blackout curtains to block light from outside. Use a white noise machine to block noise. Keep the temperature cool. Limit screen use before bedtime. This includes: Not watching TV. Not using your smartphone, tablet, or computer. Stick to a routine that includes going to bed and waking up at the same times every day and night. This can help you fall  asleep faster. Consider making a quiet activity, such as reading, part of your nighttime routine. Try to  avoid taking naps during the day so that you sleep better at night. Get out of bed if you are still awake after 15 minutes of trying to sleep. Keep the lights down, but try reading or doing a quiet activity. When you feel sleepy, go back to bed. General instructions Take over-the-counter and prescription medicines only as told by your health care provider. Exercise regularly as told by your health care provider. However, avoid exercising in the hours right before bedtime. Use relaxation techniques to manage stress. Ask your health care provider to suggest some techniques that may work well for you. These may include: Breathing exercises. Routines to release muscle tension. Visualizing peaceful scenes. Make sure that you drive carefully. Do not drive if you feel very sleepy. Keep all follow-up visits. This is important. Contact a health care provider if: You are tired throughout the day. You have trouble in your daily routine due to sleepiness. You continue to have sleep problems, or your sleep problems get worse. Get help right away if: You have thoughts about hurting yourself or someone else. Get help right away if you feel like you may hurt yourself or others, or have thoughts about taking your own life. Go to your nearest emergency room or: Call 911. Call the National Suicide Prevention Lifeline at 928-737-2131 or 988. This is open 24 hours a day. Text the Crisis Text Line at (260)506-8201. Summary Insomnia is a sleep disorder that makes it difficult to fall asleep or stay asleep. Insomnia can be long-term (chronic) or short-term (acute). Treatment for insomnia depends on the cause. Treatment may focus on treating an underlying condition that is causing the insomnia. Keep a sleep diary to help you and your health care provider figure out what could be causing your insomnia. This information is not intended to replace advice given to you by your health care provider. Make sure you discuss any  questions you have with your health care provider. Document Revised: 02/10/2021 Document Reviewed: 02/10/2021 Elsevier Patient Education  2024 [employer].

## 2024-04-17 ENCOUNTER — Other Ambulatory Visit: Payer: Self-pay | Admitting: Family Medicine

## 2024-04-17 DIAGNOSIS — E782 Mixed hyperlipidemia: Secondary | ICD-10-CM

## 2024-09-04 ENCOUNTER — Ambulatory Visit: Admitting: Family Medicine

## 2024-10-17 ENCOUNTER — Ambulatory Visit
